# Patient Record
Sex: Female | Born: 1940
Health system: Southern US, Community
[De-identification: ages and names within clinical notes are randomized; demographics above are authoritative.]

## PROBLEM LIST (undated history)

## (undated) DIAGNOSIS — C801 Malignant (primary) neoplasm, unspecified: Secondary | ICD-10-CM

## (undated) HISTORY — PX: ABDOMINAL HYSTERECTOMY: SHX81

---

## 2016-05-20 ENCOUNTER — Emergency Department (HOSPITAL_COMMUNITY): Payer: Medicaid Other

## 2016-05-20 ENCOUNTER — Encounter (HOSPITAL_COMMUNITY): Payer: Self-pay | Admitting: Emergency Medicine

## 2016-05-20 ENCOUNTER — Inpatient Hospital Stay (HOSPITAL_COMMUNITY)
Admission: EM | Admit: 2016-05-20 | Discharge: 2016-06-13 | DRG: 375 | Disposition: A | Discharge status: Home / Self Care | Payer: Medicaid Other | Attending: Internal Medicine | Admitting: Internal Medicine

## 2016-05-20 DIAGNOSIS — Z807 Family history of other malignant neoplasms of lymphoid, hematopoietic and related tissues: Secondary | ICD-10-CM

## 2016-05-20 DIAGNOSIS — R0602 Shortness of breath: Secondary | ICD-10-CM

## 2016-05-20 DIAGNOSIS — K56600 Partial intestinal obstruction, unspecified as to cause: Secondary | ICD-10-CM

## 2016-05-20 DIAGNOSIS — C561 Malignant neoplasm of right ovary: Secondary | ICD-10-CM | POA: Diagnosis present

## 2016-05-20 DIAGNOSIS — F5104 Psychophysiologic insomnia: Secondary | ICD-10-CM | POA: Diagnosis present

## 2016-05-20 DIAGNOSIS — C786 Secondary malignant neoplasm of retroperitoneum and peritoneum: Principal | ICD-10-CM

## 2016-05-20 DIAGNOSIS — D630 Anemia in neoplastic disease: Secondary | ICD-10-CM | POA: Diagnosis present

## 2016-05-20 DIAGNOSIS — C801 Malignant (primary) neoplasm, unspecified: Secondary | ICD-10-CM

## 2016-05-20 DIAGNOSIS — E440 Moderate protein-calorie malnutrition: Secondary | ICD-10-CM | POA: Insufficient documentation

## 2016-05-20 DIAGNOSIS — C562 Malignant neoplasm of left ovary: Secondary | ICD-10-CM | POA: Diagnosis present

## 2016-05-20 DIAGNOSIS — Z5189 Encounter for other specified aftercare: Secondary | ICD-10-CM

## 2016-05-20 DIAGNOSIS — E871 Hypo-osmolality and hyponatremia: Secondary | ICD-10-CM | POA: Diagnosis present

## 2016-05-20 DIAGNOSIS — I700 Atherosclerosis of aorta: Secondary | ICD-10-CM | POA: Diagnosis present

## 2016-05-20 DIAGNOSIS — T85598D Other mechanical complication of other gastrointestinal prosthetic devices, implants and grafts, subsequent encounter: Secondary | ICD-10-CM

## 2016-05-20 DIAGNOSIS — G893 Neoplasm related pain (acute) (chronic): Secondary | ICD-10-CM | POA: Diagnosis present

## 2016-05-20 DIAGNOSIS — R140 Abdominal distension (gaseous): Secondary | ICD-10-CM

## 2016-05-20 DIAGNOSIS — K5730 Diverticulosis of large intestine without perforation or abscess without bleeding: Secondary | ICD-10-CM | POA: Diagnosis present

## 2016-05-20 DIAGNOSIS — Z23 Encounter for immunization: Secondary | ICD-10-CM

## 2016-05-20 DIAGNOSIS — R7989 Other specified abnormal findings of blood chemistry: Secondary | ICD-10-CM | POA: Diagnosis present

## 2016-05-20 DIAGNOSIS — Z0189 Encounter for other specified special examinations: Secondary | ICD-10-CM

## 2016-05-20 DIAGNOSIS — Z79899 Other long term (current) drug therapy: Secondary | ICD-10-CM

## 2016-05-20 DIAGNOSIS — I708 Atherosclerosis of other arteries: Secondary | ICD-10-CM | POA: Diagnosis present

## 2016-05-20 DIAGNOSIS — R0600 Dyspnea, unspecified: Secondary | ICD-10-CM

## 2016-05-20 DIAGNOSIS — Z4659 Encounter for fitting and adjustment of other gastrointestinal appliance and device: Secondary | ICD-10-CM

## 2016-05-20 DIAGNOSIS — K209 Esophagitis, unspecified without bleeding: Secondary | ICD-10-CM

## 2016-05-20 DIAGNOSIS — Z515 Encounter for palliative care: Secondary | ICD-10-CM

## 2016-05-20 DIAGNOSIS — K56609 Unspecified intestinal obstruction, unspecified as to partial versus complete obstruction: Secondary | ICD-10-CM

## 2016-05-20 DIAGNOSIS — D473 Essential (hemorrhagic) thrombocythemia: Secondary | ICD-10-CM

## 2016-05-20 DIAGNOSIS — B370 Candidal stomatitis: Secondary | ICD-10-CM

## 2016-05-20 DIAGNOSIS — E860 Dehydration: Secondary | ICD-10-CM | POA: Diagnosis present

## 2016-05-20 DIAGNOSIS — Z90710 Acquired absence of both cervix and uterus: Secondary | ICD-10-CM

## 2016-05-20 DIAGNOSIS — K219 Gastro-esophageal reflux disease without esophagitis: Secondary | ICD-10-CM

## 2016-05-20 DIAGNOSIS — Z9221 Personal history of antineoplastic chemotherapy: Secondary | ICD-10-CM

## 2016-05-20 DIAGNOSIS — K4020 Bilateral inguinal hernia, without obstruction or gangrene, not specified as recurrent: Secondary | ICD-10-CM | POA: Diagnosis present

## 2016-05-20 DIAGNOSIS — B3781 Candidal esophagitis: Secondary | ICD-10-CM | POA: Diagnosis present

## 2016-05-20 DIAGNOSIS — R112 Nausea with vomiting, unspecified: Secondary | ICD-10-CM

## 2016-05-20 DIAGNOSIS — C569 Malignant neoplasm of unspecified ovary: Secondary | ICD-10-CM

## 2016-05-20 DIAGNOSIS — K567 Ileus, unspecified: Secondary | ICD-10-CM

## 2016-05-20 DIAGNOSIS — J189 Pneumonia, unspecified organism: Secondary | ICD-10-CM

## 2016-05-20 DIAGNOSIS — R509 Fever, unspecified: Secondary | ICD-10-CM

## 2016-05-20 DIAGNOSIS — T85598A Other mechanical complication of other gastrointestinal prosthetic devices, implants and grafts, initial encounter: Secondary | ICD-10-CM

## 2016-05-20 DIAGNOSIS — R180 Malignant ascites: Secondary | ICD-10-CM | POA: Diagnosis present

## 2016-05-20 DIAGNOSIS — E739 Lactose intolerance, unspecified: Secondary | ICD-10-CM | POA: Diagnosis present

## 2016-05-20 HISTORY — DX: Malignant (primary) neoplasm, unspecified: C80.1

## 2016-05-20 LAB — CBC WITH DIFFERENTIAL/PLATELET
BASOS ABS: 0 10*3/uL (ref 0.0–0.1)
BASOS PCT: 1 %
EOS PCT: 1 %
Eosinophils Absolute: 0.1 10*3/uL (ref 0.0–0.7)
HEMATOCRIT: 36.6 % (ref 36.0–46.0)
Hemoglobin: 12.2 g/dL (ref 12.0–15.0)
LYMPHS PCT: 27 %
Lymphs Abs: 2.4 10*3/uL (ref 0.7–4.0)
MCH: 29.4 pg (ref 26.0–34.0)
MCHC: 33.3 g/dL (ref 30.0–36.0)
MCV: 88.2 fL (ref 78.0–100.0)
MONO ABS: 0.9 10*3/uL (ref 0.1–1.0)
Monocytes Relative: 10 %
NEUTROS ABS: 5.4 10*3/uL (ref 1.7–7.7)
Neutrophils Relative %: 61 %
PLATELETS: 464 10*3/uL — AB (ref 150–400)
RBC: 4.15 MIL/uL (ref 3.87–5.11)
RDW: 12.6 % (ref 11.5–15.5)
WBC: 8.8 10*3/uL (ref 4.0–10.5)

## 2016-05-20 LAB — COMPREHENSIVE METABOLIC PANEL
ALBUMIN: 3.7 g/dL (ref 3.5–5.0)
ALT: 18 U/L (ref 14–54)
AST: 25 U/L (ref 15–41)
Alkaline Phosphatase: 73 U/L (ref 38–126)
Anion gap: 8 (ref 5–15)
BUN: 13 mg/dL (ref 6–20)
CHLORIDE: 99 mmol/L — AB (ref 101–111)
CO2: 27 mmol/L (ref 22–32)
CREATININE: 0.82 mg/dL (ref 0.44–1.00)
Calcium: 8.8 mg/dL — ABNORMAL LOW (ref 8.9–10.3)
GFR calc Af Amer: >60 mL/min (ref >60)
GLUCOSE: 116 mg/dL — AB (ref 65–99)
POTASSIUM: 4.4 mmol/L (ref 3.5–5.1)
Sodium: 134 mmol/L — ABNORMAL LOW (ref 135–145)
Total Bilirubin: 0.6 mg/dL (ref 0.3–1.2)
Total Protein: 6.7 g/dL (ref 6.5–8.1)

## 2016-05-20 LAB — URINALYSIS, ROUTINE W REFLEX MICROSCOPIC
BACTERIA UA: NONE SEEN
BILIRUBIN URINE: NEGATIVE
Glucose, UA: NEGATIVE mg/dL
HGB URINE DIPSTICK: NEGATIVE
KETONES UR: NEGATIVE mg/dL
Nitrite: NEGATIVE
PROTEIN: NEGATIVE mg/dL
SPECIFIC GRAVITY, URINE: 1.023 (ref 1.005–1.030)
pH: 7 (ref 5.0–8.0)

## 2016-05-20 MED ORDER — ONDANSETRON HCL 4 MG/2ML IJ SOLN: 4.0000 mg | Freq: Three times a day (TID) | INTRAMUSCULAR | Status: DC | PRN

## 2016-05-20 MED ORDER — ACETAMINOPHEN 325 MG PO TABS
650.0000 mg | ORAL_TABLET | ORAL | Status: DC | PRN
  Administered 2016-05-31: 650 mg via ORAL
  Filled 2016-05-20: qty 2

## 2016-05-20 MED ORDER — PANTOPRAZOLE SODIUM 20 MG PO TBEC
20.0000 mg | DELAYED_RELEASE_TABLET | Freq: Every day | ORAL | Status: DC
  Administered 2016-05-20 – 2016-05-21 (×2): 20 mg via ORAL
  Filled 2016-05-20 (×4): qty 1

## 2016-05-20 MED ORDER — MORPHINE SULFATE (PF) 4 MG/ML IV SOLN
4.0000 mg | Freq: Once | INTRAVENOUS | Status: AC | PRN
  Administered 2016-05-20: 4 mg via INTRAVENOUS
  Filled 2016-05-20: qty 1

## 2016-05-20 MED ORDER — HYDRALAZINE HCL 20 MG/ML IJ SOLN
10.0000 mg | Freq: Four times a day (QID) | INTRAMUSCULAR | Status: DC | PRN
Start: 2016-05-20 — End: 2016-06-13

## 2016-05-20 MED ORDER — ALUM & MAG HYDROXIDE-SIMETH 200-200-20 MG/5ML PO SUSP
60.0000 mL | ORAL | Status: DC | PRN
  Administered 2016-05-20 – 2016-05-29 (×5): 60 mL via ORAL
  Filled 2016-05-20 (×5): qty 60

## 2016-05-20 MED ORDER — IOPAMIDOL (ISOVUE-300) INJECTION 61%
INTRAVENOUS | Status: AC
  Administered 2016-05-20: 100 mL via INTRAVENOUS
  Filled 2016-05-20: qty 100

## 2016-05-20 MED ORDER — ONDANSETRON HCL 4 MG/2ML IJ SOLN
4.0000 mg | Freq: Four times a day (QID) | INTRAMUSCULAR | Status: DC | PRN
  Administered 2016-05-20 – 2016-05-23 (×3): 4 mg via INTRAVENOUS
  Filled 2016-05-20 (×3): qty 2

## 2016-05-20 MED ORDER — ENOXAPARIN SODIUM 40 MG/0.4ML ~~LOC~~ SOLN
40.0000 mg | SUBCUTANEOUS | Status: DC
  Administered 2016-05-20 – 2016-06-06 (×18): 40 mg via SUBCUTANEOUS
  Filled 2016-05-20 (×19): qty 0.4

## 2016-05-20 MED ORDER — SODIUM CHLORIDE 0.9 % IJ SOLN
INTRAMUSCULAR | Status: AC
  Filled 2016-05-20: qty 50

## 2016-05-20 MED ORDER — PROMETHAZINE HCL 25 MG/ML IJ SOLN
6.2500 mg | INTRAMUSCULAR | Status: DC | PRN
Start: 2016-05-20 — End: 2016-06-13
  Administered 2016-05-20: 6.25 mg via INTRAVENOUS
  Filled 2016-05-20 (×2): qty 1

## 2016-05-20 MED ORDER — OXYCODONE-ACETAMINOPHEN 5-325 MG PO TABS
1.0000 | ORAL_TABLET | ORAL | Status: DC | PRN
  Administered 2016-05-21 – 2016-06-03 (×6): 1 via ORAL
  Filled 2016-05-20 (×6): qty 1

## 2016-05-20 MED ORDER — MORPHINE SULFATE (PF) 2 MG/ML IV SOLN
2.0000 mg | INTRAVENOUS | Status: DC | PRN
Start: 2016-05-20 — End: 2016-06-03
  Administered 2016-05-22 – 2016-06-02 (×2): 2 mg via INTRAVENOUS
  Filled 2016-05-20 (×2): qty 1

## 2016-05-20 MED ORDER — ENSURE ENLIVE PO LIQD
237.0000 mL | Freq: Two times a day (BID) | ORAL | Status: DC
  Administered 2016-05-21 – 2016-05-28 (×5): 237 mL via ORAL

## 2016-05-20 MED ORDER — ZOLPIDEM TARTRATE 5 MG PO TABS
5.0000 mg | ORAL_TABLET | Freq: Once | ORAL | Status: AC
Start: 2016-05-20 — End: 2016-05-29
  Administered 2016-05-29: 5 mg via ORAL
  Filled 2016-05-20 (×2): qty 1

## 2016-05-20 MED ORDER — SENNOSIDES-DOCUSATE SODIUM 8.6-50 MG PO TABS
1.0000 | ORAL_TABLET | Freq: Two times a day (BID) | ORAL | Status: DC
  Administered 2016-05-20 – 2016-05-29 (×10): 1 via ORAL
  Filled 2016-05-20 (×12): qty 1

## 2016-05-20 MED ORDER — SIMETHICONE 80 MG PO CHEW: 160.0000 mg | CHEWABLE_TABLET | Freq: Four times a day (QID) | ORAL | Status: DC | PRN

## 2016-05-20 NOTE — ED Notes (Signed)
Patient transported to CT 

## 2016-05-20 NOTE — ED Triage Notes (Signed)
Assessment with use of interp pad in Spanish, c/o bloated, pain, gas, symptoms started 3 weeks ago cough, pain started with cough ended, Lots of gas, no difficulty with bowels, small bms due to decreased appetite, no vomiting, no fever, OTC meds Tylenol and Gas X. Pain in abd more across bottom, scar on abd from surgery due to tumor in ovaries that spread, Last Chemo Sept 17.

## 2016-05-20 NOTE — H&P (Signed)
History and Physical  Audrey Garza [id number] DOB: 1941/02/04 DOA: 05/20/2016  Referring physician: EDP PCP: No primary care provider on file.   Chief Complaint: abdominal pain  HPI: Audrey Garza is a 75 y.o. 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 She started to have progressive abdominal bloating and pain in the last three weeks, she denies fever, no n/v, no diarrhea. No chest pain or sob.  ED course: her vital is stable, basic labs with mild thrombocytosis plt 464, mild hyponatremia, sodium 134, ua with large leukocytes but no bacteria. CT ab/pel: Extensive peritoneal disease with omental thickening and large volume ascites. 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 Trace bilateral pleural effusions. Moderate hiatal hernia. Bilateral inguinal hernias containing fat.Left colonic diverticulosis.  Aortoiliac atherosclerosis.  hospitalist called to observe this patient in the hospital for pain control and diagnostic and therapeutic paracentesis.     Review of Systems:  Detail per HPI, Review of systems are otherwise negative  Past Medical History:  Diagnosis Date  . Cancer [facility])    Past Surgical History:  Procedure Laterality Date  . ABDOMINAL HYSTERECTOMY     Social History:  reports that she has never smoked. She has never used smokeless tobacco. She reports that she does not drink alcohol. Her drug history is not on file. Patient lives at home & is able to participate in activities of daily living independently   No Known Allergies  No family history on file.    Prior to Admission medications   Medication Sig Start Date End Date Taking? Authorizing Provider  acetaminophen (TYLENOL) 325 MG tablet Take 650  mg by mouth every 5 (five) hours as needed for moderate pain.   Yes Historical Provider, MD  magnesium 30 MG tablet Take 30 mg by mouth 2 (two) times daily.   Yes Historical Provider, MD  Potassium 75 MG TABS Take 75 mg by mouth daily.   Yes Historical Provider, MD  simethicone (GAS-X) 80 MG chewable tablet Chew 160 mg by mouth every 6 (six) hours as needed for flatulence.   Yes Historical Provider, MD    Physical Exam: BP 184/96   Pulse 86   Temp 98.1 F (36.7 C) (Oral)   Resp 16   Ht 5\' 3"  (1.6 m)   Wt 78 kg (171 lb 15.3 oz)   SpO2 95%   BMI 30.46 kg/m   General:  NAD Eyes: PERRL ENT: unremarkable Neck: supple, no JVD Cardiovascular: RRR Respiratory: CTABL Abdomen: distended, soft, nontender, positive bowel sounds,  Skin: no rash Musculoskeletal:  No edema Psychiatric: calm/cooperative Neurologic: no focal findings            Labs on Admission:  Basic Metabolic Panel:  Recent Labs Lab 05/20/16 1336  NA 134*  K 4.4  CL 99*  CO2 27  GLUCOSE 116*  BUN 13  CREATININE 0.82  CALCIUM 8.8*   Liver Function Tests:  Recent Labs Lab 05/20/16 1336  AST 25  ALT 18  ALKPHOS 73  BILITOT 0.6  PROT 6.7  ALBUMIN 3.7   No results for input(s): LIPASE, AMYLASE in the last 168 hours. No results for input(s): AMMONIA in the last 168 hours. CBC:  Recent Labs Lab 05/20/16 1336  WBC 8.8  NEUTROABS 5.4  HGB 12.2  HCT 36.6  MCV 88.2  PLT 464*   Cardiac Enzymes: No results for input(s): CKTOTAL, CKMB, CKMBINDEX, TROPONINI in the last 168 hours.  BNP (last 3 results) No results for input(s): BNP in the last 8760 hours.  ProBNP (last 3 results) No results for input(s): PROBNP in the last 8760 hours.  CBG: No results for input(s): GLUCAP in the last 168 hours.  Radiological Exams on Admission: Ct Abdomen Pelvis W Contrast  Result Date: 05/20/2016 CLINICAL DATA:  Abdominal distention, pain. EXAM: CT ABDOMEN AND PELVIS WITH CONTRAST TECHNIQUE: Multidetector  CT imaging of the abdomen and pelvis was performed using the standard protocol following bolus administration of intravenous contrast. CONTRAST:  146mL ISOVUE-300 IOPAMIDOL (ISOVUE-300) INJECTION 61% COMPARISON:  None. FINDINGS: Lower chest: Bibasilar atelectasis. Heart is normal size. Moderate-sized hiatal hernia. Trace bilateral effusions. Hepatobiliary: No focal hepatic abnormality. Gallbladder unremarkable. Pancreas: No focal abnormality or ductal dilatation. Spleen: No focal abnormality.  Normal size. Adrenals/Urinary Tract: Small cyst in the midpole of the left kidney. No hydronephrosis. Adrenal glands and urinary bladder unremarkable. Stomach/Bowel: Descending colonic and sigmoid diverticulosis. No active diverticulitis. Stomach and small bowel unremarkable. Vascular/Lymphatic: Aortic and iliac calcifications. No aneurysm. No adenopathy. Reproductive: Prior hysterectomy.  No adnexal masses. Other: There is large volume ascites in the abdomen or pelvis. Extensive abnormal soft tissue throughout the omentum R with peritoneal malignancy. No free air. Bilateral inguinal hernias containing fat. Musculoskeletal: No acute bony abnormality or focal bone lesion. IMPRESSION: Extensive peritoneal disease with omental thickening/ 18 and large volume ascites. 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 Trace bilateral pleural effusions. Moderate hiatal hernia. Bilateral inguinal hernias containing fat. Left colonic diverticulosis. Aortoiliac atherosclerosis. Electronically Signed   By: Rolm Baptise M.D.   On: 05/20/2016 15:10      Assessment/Plan Present on Admission: **None**  Symptomatic large Malignant ascites : With significant pain, bloating, although patient denies n/v initially, she vomited after she is transferred from the ED to the floor. Analgesics and antiemtics prn, full liquid diet for now Npo aftermidnight for diagnostic and therapeutic paracentesis. Fluids study  ordered.   Metastatic ovarian cancer with extensive peritoneal disease with omental thickening with large ascites: Will check ca 125,  Family will bring in prior medical record on sunday She still seems to have good performance status, will consult gyn onc Dr Marko Plume on Monday  Cancer pain:  Prn percocet and iv morphine with stool softener.   Mild hyponatremia: likely from dehydration, will avoid ivf for now due to large ascites,   Mild thrombocytosis: likely reactive.   DVT prophylaxis: lovenox  Consultants: will call Dr Marko Plume on monday  Code Status: full   Family Communication:  Patient , her daughter at bedside, her son in law over the phone, daughter states they will bring in patient's medical record tomorrow  Disposition Plan: admit to med surg obs  Time spent: 70mins  Audrey Betzold MD, PhD [facility] Pager [phone] If 7PM-7AM, please contact night-coverage at [email or web address], password [facility]

## 2016-05-20 NOTE — ED Provider Notes (Signed)
[facility] DEPT Provider Note   CSN: [id number] Arrival date & time: 05/20/16  1301     History   Chief Complaint No chief complaint on file.   HPI Audrey Garza is a 75 y.o. female.  Patient with h/o reported metastatic ovarian CA with spread to the peritoneum and lymph glands, status post surgery in [place] in 04/2015, status post chemotherapy ending 02/2016, recently moved to [place] approximately 6 weeks ago -- presents with complaint of worsening abdominal pain, decreased urination, decreased bowel movements, and unable to pass gas for the past 3 weeks. Abdomen is becoming more distended and painful. Patient has a very poor appetite. No CP or SOB. The onset of this condition was acute. The course is worsening. Aggravating factors: none. Alleviating factors: none.        Past Medical History:  Diagnosis Date  . Cancer ([facility])     There are no active problems to display for this patient.   Past Surgical History:  Procedure Laterality Date  . ABDOMINAL HYSTERECTOMY      OB History    No data available       Home Medications    Prior to Admission medications   Not on File    Family History No family history on file.  Social History Social History  Substance Use Topics  . Smoking status: Never Smoker  . Smokeless tobacco: Never Used  . Alcohol use No     Allergies   Patient has no allergy information on record.   Review of Systems Review of Systems  Constitutional: Negative for fever.  HENT: Negative for rhinorrhea and sore throat.   Eyes: Negative for redness.  Respiratory: Negative for cough.   Cardiovascular: Negative for chest pain.  Gastrointestinal: Positive for abdominal distention, abdominal pain, constipation and nausea. Negative for diarrhea and vomiting.  Genitourinary: Negative for dysuria.  Musculoskeletal: Negative for myalgias.  Skin: Negative for rash.  Neurological: Negative for headaches.     Physical  Exam Updated Vital Signs BP 168/99 (BP Location: Left Arm)   Pulse 92   Temp 98.1 F (36.7 C) (Oral)   Resp 18   Ht 5\' 3"  (1.6 m)   Wt 78 kg   SpO2 96%   BMI 30.46 kg/m   Physical Exam  Constitutional: She appears well-developed and well-nourished.  HENT:  Head: Normocephalic and atraumatic.  Eyes: Conjunctivae are normal. Right eye exhibits no discharge. Left eye exhibits no discharge.  Neck: Normal range of motion. Neck supple.  Cardiovascular: Normal rate, regular rhythm and normal heart sounds.   Pulmonary/Chest: Effort normal and breath sounds normal.  Abdominal: Soft. She exhibits distension and mass (RLQ and LLQ). There is tenderness (mild, generalized). There is no rebound and no guarding.  Neurological: She is alert.  Skin: Skin is warm and dry.  Psychiatric: She has a normal mood and affect.  Nursing note and vitals reviewed.    ED Treatments / Results  Labs (all labs ordered are listed, but only abnormal results are displayed) Labs Reviewed  CBC WITH DIFFERENTIAL/PLATELET - Abnormal; Notable for the following:       Result Value   Platelets 464 (*)    All other components within normal limits  COMPREHENSIVE METABOLIC PANEL - Abnormal; Notable for the following:    Sodium 134 (*)    Chloride 99 (*)    Glucose, Bld 116 (*)    Calcium 8.8 (*)    All other components within normal limits  URINALYSIS, ROUTINE  W REFLEX MICROSCOPIC - Abnormal; Notable for the following:    APPearance HAZY (*)    Leukocytes, UA LARGE (*)    Squamous Epithelial / LPF 0-5 (*)    Non Squamous Epithelial 0-5 (*)    All other components within normal limits  BASIC METABOLIC PANEL  PROTIME-INR  CA 125  TSH    Radiology Ct Abdomen Pelvis W Contrast  Result Date: 05/20/2016 CLINICAL DATA:  Abdominal distention, pain. EXAM: CT ABDOMEN AND PELVIS WITH CONTRAST TECHNIQUE: Multidetector CT imaging of the abdomen and pelvis was performed using the standard protocol following bolus  administration of intravenous contrast. CONTRAST:  165mL ISOVUE-300 IOPAMIDOL (ISOVUE-300) INJECTION 61% COMPARISON:  None. FINDINGS: Lower chest: Bibasilar atelectasis. Heart is normal size. Moderate-sized hiatal hernia. Trace bilateral effusions. Hepatobiliary: No focal hepatic abnormality. Gallbladder unremarkable. Pancreas: No focal abnormality or ductal dilatation. Spleen: No focal abnormality.  Normal size. Adrenals/Urinary Tract: Small cyst in the midpole of the left kidney. No hydronephrosis. Adrenal glands and urinary bladder unremarkable. Stomach/Bowel: Descending colonic and sigmoid diverticulosis. No active diverticulitis. Stomach and small bowel unremarkable. Vascular/Lymphatic: Aortic and iliac calcifications. No aneurysm. No adenopathy. Reproductive: Prior hysterectomy.  No adnexal masses. Other: There is large volume ascites in the abdomen or pelvis. Extensive abnormal soft tissue throughout the omentum R with peritoneal malignancy. No free air. Bilateral inguinal hernias containing fat. Musculoskeletal: No acute bony abnormality or focal bone lesion. IMPRESSION: Extensive peritoneal disease with omental thickening/ 18 and large volume ascites. 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 Trace bilateral pleural effusions. Moderate hiatal hernia. Bilateral inguinal hernias containing fat. Left colonic diverticulosis. Aortoiliac atherosclerosis. Electronically Signed   By: Rolm Baptise M.D.   On: 05/20/2016 15:10    Procedures Procedures (including critical care time)  Medications Ordered in ED Medications  sodium chloride 0.9 % injection (  Not Given 05/20/16 1445)  ondansetron (ZOFRAN) injection 4 mg (4 mg Intravenous Given 05/20/16 1622)  acetaminophen (TYLENOL) tablet 650 mg (not administered)  simethicone (MYLICON) chewable tablet 160 mg (not administered)  enoxaparin (LOVENOX) injection 40 mg (not administered)  ondansetron (ZOFRAN) injection 4 mg (not  administered)  morphine 2 MG/ML injection 2 mg (not administered)  oxyCODONE-acetaminophen (PERCOCET/ROXICET) 5-325 MG per tablet 1 tablet (not administered)  senna-docusate (Senokot-S) tablet 1 tablet (not administered)  pantoprazole (PROTONIX) EC tablet 20 mg (not administered)  feeding supplement (ENSURE ENLIVE) (ENSURE ENLIVE) liquid 237 mL (not administered)  alum & mag hydroxide-simeth (MAALOX/MYLANTA) 200-200-20 MG/5ML suspension 60 mL (not administered)  iopamidol (ISOVUE-300) 61 % injection (100 mLs Intravenous Contrast Given 05/20/16 1445)  morphine 4 MG/ML injection 4 mg (4 mg Intravenous Given 05/20/16 1622)     Initial Impression / Assessment and Plan / ED Course  I have reviewed the triage vital signs and the nursing notes.  Pertinent labs & imaging results that were available during my care of the patient were reviewed by me and considered in my medical decision making (see chart for details).  Clinical Course    Patient seen and examined. Work-up initiated. History taken from patient's daughters (who speak English) and from patient (using visual interpreter).   Vital signs reviewed and are as follows: BP 168/99 (BP Location: Left Arm)   Pulse 92   Temp 98.1 F (36.7 C) (Oral)   Resp 18   Ht 5\' 3"  (1.6 m)   Wt 78 kg   SpO2 96%   BMI 30.46 kg/m   Patient discussed with and seen by Dr. Tamera Punt.  Patient and family updated on results. Will admit for symptom management and possible therapeutic paracentesis. She would also benefit from SW assistance to arrange care as she is new to the [place] and is not established here with a very serious medical condition.   Spoke with Dr. Erlinda Hong of [facility] who will see. Admit to obs.   Final Clinical Impressions(s) / ED Diagnoses   Final diagnoses:  Therapeutic  Malignant ascites   Admit.   New Prescriptions Current Discharge Medication List       Carlisle Cater, PA-C 05/20/16 [place], MD 05/21/16 [phone]

## 2016-05-20 NOTE — ED Triage Notes (Signed)
Attempted to call report to [address], nurse not available, waiting for call back for report.

## 2016-05-20 NOTE — ED Triage Notes (Signed)
Pt presents with daughter, does not speak english. C/o abdominal distention and pain, acid reflux and urinary retention. Pt has hx of peritoneal cancer and was cleared this past September. Pts abdomen appears distended and soft to touch. Daughter adds that pt has not been able to eat, has one bite and c/o feeling full and food is coming back up.

## 2016-05-20 NOTE — ED Triage Notes (Signed)
Emotional support provided to family and updates regarding POC

## 2016-05-21 DIAGNOSIS — K5730 Diverticulosis of large intestine without perforation or abscess without bleeding: Secondary | ICD-10-CM | POA: Diagnosis present

## 2016-05-21 DIAGNOSIS — K56600 Partial intestinal obstruction, unspecified as to cause: Secondary | ICD-10-CM | POA: Diagnosis present

## 2016-05-21 DIAGNOSIS — Z90710 Acquired absence of both cervix and uterus: Secondary | ICD-10-CM | POA: Diagnosis not present

## 2016-05-21 DIAGNOSIS — C562 Malignant neoplasm of left ovary: Secondary | ICD-10-CM | POA: Diagnosis present

## 2016-05-21 DIAGNOSIS — K567 Ileus, unspecified: Secondary | ICD-10-CM | POA: Diagnosis present

## 2016-05-21 DIAGNOSIS — E871 Hypo-osmolality and hyponatremia: Secondary | ICD-10-CM | POA: Diagnosis present

## 2016-05-21 DIAGNOSIS — I708 Atherosclerosis of other arteries: Secondary | ICD-10-CM | POA: Diagnosis present

## 2016-05-21 DIAGNOSIS — R180 Malignant ascites: Secondary | ICD-10-CM | POA: Diagnosis present

## 2016-05-21 DIAGNOSIS — G893 Neoplasm related pain (acute) (chronic): Secondary | ICD-10-CM | POA: Diagnosis present

## 2016-05-21 DIAGNOSIS — C561 Malignant neoplasm of right ovary: Secondary | ICD-10-CM | POA: Diagnosis present

## 2016-05-21 DIAGNOSIS — C786 Secondary malignant neoplasm of retroperitoneum and peritoneum: Secondary | ICD-10-CM | POA: Diagnosis present

## 2016-05-21 DIAGNOSIS — I700 Atherosclerosis of aorta: Secondary | ICD-10-CM | POA: Diagnosis present

## 2016-05-21 DIAGNOSIS — E860 Dehydration: Secondary | ICD-10-CM | POA: Diagnosis present

## 2016-05-21 DIAGNOSIS — B3781 Candidal esophagitis: Secondary | ICD-10-CM | POA: Diagnosis present

## 2016-05-21 DIAGNOSIS — K4020 Bilateral inguinal hernia, without obstruction or gangrene, not specified as recurrent: Secondary | ICD-10-CM | POA: Diagnosis present

## 2016-05-21 DIAGNOSIS — B370 Candidal stomatitis: Secondary | ICD-10-CM | POA: Diagnosis present

## 2016-05-21 DIAGNOSIS — E440 Moderate protein-calorie malnutrition: Secondary | ICD-10-CM | POA: Diagnosis present

## 2016-05-21 DIAGNOSIS — Z79899 Other long term (current) drug therapy: Secondary | ICD-10-CM | POA: Diagnosis not present

## 2016-05-21 LAB — BASIC METABOLIC PANEL
ANION GAP: 5 (ref 5–15)
BUN: 11 mg/dL (ref 6–20)
CALCIUM: 8.8 mg/dL — AB (ref 8.9–10.3)
CHLORIDE: 101 mmol/L (ref 101–111)
CO2: 29 mmol/L (ref 22–32)
Creatinine, Ser: 0.87 mg/dL (ref 0.44–1.00)
GFR calc non Af Amer: >60 mL/min (ref >60)
Glucose, Bld: 113 mg/dL — ABNORMAL HIGH (ref 65–99)
Potassium: 4.6 mmol/L (ref 3.5–5.1)
SODIUM: 135 mmol/L (ref 135–145)

## 2016-05-21 LAB — PROTIME-INR
INR: 0.96
PROTHROMBIN TIME: 12.7 s (ref 11.4–15.2)

## 2016-05-21 LAB — TSH: TSH: 4.489 u[IU]/mL (ref 0.350–4.500)

## 2016-05-21 MED ORDER — ZOLPIDEM TARTRATE 5 MG PO TABS
5.0000 mg | ORAL_TABLET | Freq: Once | ORAL | Status: AC
  Administered 2016-05-21: 5 mg via ORAL
  Filled 2016-05-21: qty 1

## 2016-05-21 NOTE — Progress Notes (Signed)
PROGRESS NOTE  Audrey Garza [id number] DOB: 1941/04/09 DOA: 05/20/2016 PCP: No primary care provider on file.  HPI/Recap of past 24 hours:  Vomited once yesterday evening, no vomiting since Abdominal bloated, requiring prn morphine,  Awaiting diagnostic and therapeutic paracentesis  Patient does not speak english, Family at bedside translating  Assessment/Plan: Active Problems:   Malignant ascites  Symptomatic large Malignant ascites : With significant pain, bloating, although patient denies n/v initially, she vomited after she is transferred from the ED to the floor. Analgesics and antiemtics prn, full liquid diet for now Npo aftermidnight for diagnostic and therapeutic paracentesis. Fluids study ordered.   Metastatic ovarian cancer with extensive peritoneal disease with omental thickening with large ascites: ca 125 pending Family brought in prior medical record, but in [place]. She still seems to have good performance status, will consult gyn onc Dr Marko Plume on Monday  Cancer pain:  Prn percocet and iv morphine with stool softener.   Mild hyponatremia: likely from dehydration, will avoid ivf for now due to large ascites,   Mild thrombocytosis: likely reactive.   DVT prophylaxis: lovenox  Code Status: full  Family Communication: patient , her daughter and son in law at bedside  Disposition Plan: home in 1-2 days   Consultants:  Will call Dr Marko Plume on monday  Procedures:  paracentesis  Antibiotics:  none   Objective: BP 138/79 (BP Location: Right Arm)   Pulse 76   Temp 98.4 F (36.9 C) (Oral)   Resp 14   Ht 5\' 3"  (1.6 m)   Wt 80.2 kg (176 lb 12.9 oz)   SpO2 95%   BMI 31.32 kg/m   Intake/Output Summary (Last 24 hours) at 05/21/16 1559 Last data filed at 05/21/16 1346  Gross per 24 hour  Intake              600 ml  Output                0 ml  Net              600 ml   Filed Weights   05/20/16 1332 05/20/16 1818  Weight: 78 kg  (171 lb 15.3 oz) 80.2 kg (176 lb 12.9 oz)    Exam:   General:  Intermittent abdominal pain, poor appetite  Cardiovascular: RRR  Respiratory: CTABL  Abdomen: distended, soft, nontender, positive bowel sounds  Musculoskeletal: No Edema  Neuro: aaox3  Data Reviewed: Basic Metabolic Panel:  Recent Labs Lab 05/20/16 1336 05/21/16 0351  NA 134* 135  K 4.4 4.6  CL 99* 101  CO2 27 29  GLUCOSE 116* 113*  BUN 13 11  CREATININE 0.82 0.87  CALCIUM 8.8* 8.8*   Liver Function Tests:  Recent Labs Lab 05/20/16 1336  AST 25  ALT 18  ALKPHOS 73  BILITOT 0.6  PROT 6.7  ALBUMIN 3.7   No results for input(s): LIPASE, AMYLASE in the last 168 hours. No results for input(s): AMMONIA in the last 168 hours. CBC:  Recent Labs Lab 05/20/16 1336  WBC 8.8  NEUTROABS 5.4  HGB 12.2  HCT 36.6  MCV 88.2  PLT 464*   Cardiac Enzymes:   No results for input(s): CKTOTAL, CKMB, CKMBINDEX, TROPONINI in the last 168 hours. BNP (last 3 results) No results for input(s): BNP in the last 8760 hours.  ProBNP (last 3 results) No results for input(s): PROBNP in the last 8760 hours.  CBG: No results for input(s): GLUCAP in the last 168 hours.  No results  found for this or any previous visit (from the past 240 hour(s)).   Studies: No results found.  Scheduled Meds: . enoxaparin (LOVENOX) injection  40 mg Subcutaneous Q24H  . feeding supplement (ENSURE ENLIVE)  237 mL Oral BID BM  . pantoprazole  20 mg Oral Daily  . senna-docusate  1 tablet Oral BID  . zolpidem  5 mg Oral Once    Continuous Infusions:   Time spent: 53mins  Deneka Greenwalt MD, PhD  [facility] Pager [phone]. If 7PM-7AM, please contact night-coverage at [email or web address], password [facility] 05/21/2016, 3:59 PM  LOS: 0 days

## 2016-05-22 ENCOUNTER — Inpatient Hospital Stay (HOSPITAL_COMMUNITY): Payer: Medicaid Other

## 2016-05-22 LAB — PROTEIN, BODY FLUID: Total protein, fluid: 4.2 g/dL

## 2016-05-22 LAB — ALBUMIN, FLUID (OTHER): Albumin, Fluid: 2.7 g/dL

## 2016-05-22 LAB — LACTATE DEHYDROGENASE, PLEURAL OR PERITONEAL FLUID: LD FL: 1000 U/L — AB (ref 3–23)

## 2016-05-22 LAB — BODY FLUID CELL COUNT WITH DIFFERENTIAL
Lymphs, Fluid: 48 %
Monocyte-Macrophage-Serous Fluid: 43 % — ABNORMAL LOW (ref 50–90)
Neutrophil Count, Fluid: 9 % (ref 0–25)
WBC FLUID: 1514 uL — AB (ref 0–1000)

## 2016-05-22 LAB — GRAM STAIN

## 2016-05-22 LAB — GLUCOSE, SEROUS FLUID: GLUCOSE FL: 86 mg/dL

## 2016-05-22 LAB — CA 125: CA 125: 249.8 U/mL — ABNORMAL HIGH (ref 0.0–38.1)

## 2016-05-22 NOTE — Progress Notes (Signed)
Medical Oncology  EMR reviewed and case discussed with primary service.  Patient seen together with daughter. Patient speaks only Spanish, however daughter is able to give history of ovarian carcinoma treated with surgery and chemotherapy (taxol carboplatin avastin) in [place] in past year, chemotherapy completed early 02-2016. Summary records in [place]  and scans from [place] are with patient.   Patient is now living with daughter in [place]. She presented to ED on 05-20-16 with 1-2 weeks of abdominal fullness and soreness similar to symptoms at initial diagnosis.   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 She had US paracentesis for 2.9 liters ascites earlier today, cytology pending.  Patient has had vomiting x 2 earlier today, still generalized abdominal soreness tho overall more comfortable since paracentesis. Recent respiratory illness has resolved.   Full note to follow, with translation of outside medical records pending. 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 Agree with symptom management now, hopefully will be more comfortable with paracentesis done today. If stable for DC, expect to begin second line chemotherapy soon as outpatient. Will follow with you.    Evlyn Clines MD    [phone] pager

## 2016-05-22 NOTE — Procedures (Signed)
Successful US guided diagnostic and therapeutic paracentesis from right lower abdomen.  Yielded 2.9 L of clear, yellow fluid.  No immediate complications.  Pt tolerated well.   Specimen was sent for labs.  Docia Barrier PA-C 05/22/2016 12:45 PM

## 2016-05-22 NOTE — Progress Notes (Addendum)
PROGRESS NOTE  Audrey Garza [id number] DOB: 02/08/1941 DOA: 05/20/2016 PCP: No primary care provider on file.  HPI/Recap of past 24 hours:  Awaiting for paracentesis Abdominal bloated, requiring prn morphine,  Awaiting diagnostic and therapeutic paracentesis  Patient does not speak english, Family at bedside translating  Assessment/Plan: Active Problems:   Malignant ascites  Symptomatic large Malignant ascites : With significant pain, bloating, although patient denies n/v initially, she vomited after she is transferred from the ED to the floor. Analgesics and antiemtics prn, full liquid diet for now Npo aftermidnight for diagnostic and therapeutic paracentesis. Fluids study ordered.   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 She still seems to have good performance status, gyn onc Dr Marko Plume consulted,  Patient is to have paracentesis today, she will be seen by Dr Marko Plume today, patient may be able to be discharged after eval by Dr Marko Plume.  Cancer pain:  Prn percocet and iv morphine with stool softener.   Mild hyponatremia: likely from dehydration, will avoid ivf for now due to large ascites, encourage oral intake, sodium normalized.  Mild thrombocytosis: likely reactive.  recurrent n/v, ct abdomen did showed hiatal hernia, family request gi consult  DVT prophylaxis: lovenox  Code Status: full  Family Communication: patient , her daughter and son in law at bedside  Disposition Plan: home if cleared by Dr Marko Plume and gi   Consultants:  Gyn onc Dr Marko Plume   Procedures:  Diagnostic and therapeutic Paracentesis on 12/18  Antibiotics:  none   Objective: BP 140/68 (BP Location: Right Arm)   Pulse 75   Temp 97.6 F (36.4 C) (Oral)   Resp 14   Ht 5\' 3"  (1.6 m)   Wt 80.2 kg (176 lb 12.9 oz)   SpO2 99%   BMI  31.32 kg/m   Intake/Output Summary (Last 24 hours) at 05/22/16 0805 Last data filed at 05/22/16 0525  Gross per 24 hour  Intake              720 ml  Output                0 ml  Net              720 ml   Filed Weights   05/20/16 1332 05/20/16 1818  Weight: 78 kg (171 lb 15.3 oz) 80.2 kg (176 lb 12.9 oz)    Exam:   General:  Intermittent abdominal pain, poor appetite  Cardiovascular: RRR  Respiratory: CTABL  Abdomen: distended, soft, nontender, positive bowel sounds  Musculoskeletal: No Edema  Neuro: aaox3  Data Reviewed: Basic Metabolic Panel:  Recent Labs Lab 05/20/16 1336 05/21/16 0351  NA 134* 135  K 4.4 4.6  CL 99* 101  CO2 27 29  GLUCOSE 116* 113*  BUN 13 11  CREATININE 0.82 0.87  CALCIUM 8.8* 8.8*   Liver Function Tests:  Recent Labs Lab 05/20/16 1336  AST 25  ALT 18  ALKPHOS 73  BILITOT 0.6  PROT 6.7  ALBUMIN 3.7   No results for input(s): LIPASE, AMYLASE in the last 168 hours. No results for input(s): AMMONIA in the last 168 hours. CBC:  Recent Labs Lab 05/20/16 1336  WBC 8.8  NEUTROABS 5.4  HGB 12.2  HCT 36.6  MCV 88.2  PLT 464*   Cardiac Enzymes:   No results for input(s): CKTOTAL, CKMB, CKMBINDEX, TROPONINI in the last 168 hours.  BNP (last 3 results) No results for input(s): BNP in the last 8760 hours.  ProBNP (last 3 results) No results for input(s): PROBNP in the last 8760 hours.  CBG: No results for input(s): GLUCAP in the last 168 hours.  No results found for this or any previous visit (from the past 240 hour(s)).   Studies: No results found.  Scheduled Meds: . enoxaparin (LOVENOX) injection  40 mg Subcutaneous Q24H  . feeding supplement (ENSURE ENLIVE)  237 mL Oral BID BM  . pantoprazole  20 mg Oral Daily  . senna-docusate  1 tablet Oral BID  . zolpidem  5 mg Oral Once    Continuous Infusions:   Time spent: 58mins  Oscar Hank MD, PhD  [facility] Pager [phone]. If 7PM-7AM, please contact  night-coverage at [email or web address], password [facility] 05/22/2016, 8:05 AM  LOS: 1 day

## 2016-05-23 ENCOUNTER — Inpatient Hospital Stay (HOSPITAL_COMMUNITY): Payer: Medicaid Other

## 2016-05-23 DIAGNOSIS — K567 Ileus, unspecified: Secondary | ICD-10-CM

## 2016-05-23 DIAGNOSIS — C786 Secondary malignant neoplasm of retroperitoneum and peritoneum: Principal | ICD-10-CM

## 2016-05-23 DIAGNOSIS — C569 Malignant neoplasm of unspecified ovary: Secondary | ICD-10-CM

## 2016-05-23 DIAGNOSIS — I700 Atherosclerosis of aorta: Secondary | ICD-10-CM

## 2016-05-23 DIAGNOSIS — K5730 Diverticulosis of large intestine without perforation or abscess without bleeding: Secondary | ICD-10-CM

## 2016-05-23 DIAGNOSIS — J90 Pleural effusion, not elsewhere classified: Secondary | ICD-10-CM

## 2016-05-23 DIAGNOSIS — E440 Moderate protein-calorie malnutrition: Secondary | ICD-10-CM | POA: Insufficient documentation

## 2016-05-23 DIAGNOSIS — K4090 Unilateral inguinal hernia, without obstruction or gangrene, not specified as recurrent: Secondary | ICD-10-CM

## 2016-05-23 DIAGNOSIS — K56699 Other intestinal obstruction unspecified as to partial versus complete obstruction: Secondary | ICD-10-CM

## 2016-05-23 DIAGNOSIS — K56609 Unspecified intestinal obstruction, unspecified as to partial versus complete obstruction: Secondary | ICD-10-CM

## 2016-05-23 DIAGNOSIS — K5909 Other constipation: Secondary | ICD-10-CM

## 2016-05-23 DIAGNOSIS — R180 Malignant ascites: Secondary | ICD-10-CM

## 2016-05-23 MED ORDER — BISACODYL 10 MG RE SUPP
10.0000 mg | Freq: Every day | RECTAL | Status: AC
  Administered 2016-05-23 – 2016-05-25 (×3): 10 mg via RECTAL
  Filled 2016-05-23 (×3): qty 1

## 2016-05-23 MED ORDER — SODIUM CHLORIDE 0.9 % IV SOLN
INTRAVENOUS | Status: AC
  Administered 2016-05-23 – 2016-05-24 (×3): via INTRAVENOUS

## 2016-05-23 MED ORDER — PANTOPRAZOLE SODIUM 40 MG IV SOLR
40.0000 mg | INTRAVENOUS | Status: DC
  Administered 2016-05-23 – 2016-06-13 (×22): 40 mg via INTRAVENOUS
  Filled 2016-05-23 (×22): qty 40

## 2016-05-23 MED ORDER — ONDANSETRON HCL 40 MG/20ML IJ SOLN
8.0000 mg | Freq: Three times a day (TID) | INTRAMUSCULAR | Status: DC
  Filled 2016-05-23 (×2): qty 4

## 2016-05-23 MED ORDER — LORAZEPAM 2 MG/ML IJ SOLN
0.5000 mg | Freq: Four times a day (QID) | INTRAMUSCULAR | Status: DC | PRN
  Administered 2016-06-06: 1 mg via INTRAVENOUS
  Filled 2016-05-23 (×2): qty 1

## 2016-05-23 MED ORDER — SODIUM CHLORIDE 0.9 % IV SOLN
8.0000 mg | Freq: Three times a day (TID) | INTRAVENOUS | Status: DC | PRN
  Filled 2016-05-23: qty 4

## 2016-05-23 NOTE — Consult Note (Signed)
Reason for Consult: SBO Referring Physician: Dr. Wanda Plump. Audrey Garza  Audrey Garza is an 75 y.o. female.  HPI: Pt presented to the ED on 05/20/16 with abdominal distention and pain, acid reflux and urinary retention. She is not able to eat or drink, secondary to vomiting. Pt does not speak [place] and recently came from [place]. 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   Work up in the ED showed she was afebrile, and VSS.  Na down, but otherwise stable.  CA 125 is 249.  CT scan on admit : Descending colonic and sigmoid diverticulosis. No active diverticulitis. Stomach and small bowel unremarkable. There is large volume ascites in the abdomen or pelvis. Extensive abnormal soft tissue throughout the omentum R withperitoneal malignancy. No free air. Bilateral inguinal hernias containing fat. Moderate hiatal hernia. Bilateral inguinal hernias containing fat.  Extensive peritoneal disease with omental thickening and large volume ascites. No evidence of obstruction on CT. She underwent paracentesis yesterday by IR, and drained 2.9 liters.  Today plain film show Multiple loops of dilated bowel raise concern for a degree of bowel obstruction. No free air evident. There is moderate stool in the colon. She is passing flatus. We are ask to see. She underwent oophorectomy in [place] and finished chemo about 3 months ago.    Past Medical History:  Diagnosis Date  . Cancer [facility])     Past Surgical History:  Procedure Laterality Date  . ABDOMINAL HYSTERECTOMY      History reviewed. No pertinent family history.  Social History:  reports that she has never smoked. She has never used smokeless tobacco. She reports that she does not drink alcohol. Her drug history is not on file.  Allergies: No Known Allergies  Medications:  Prior to Admission:  Prescriptions Prior to Admission  Medication Sig Dispense Refill Last Dose  . acetaminophen  (TYLENOL) 325 MG tablet Take 650 mg by mouth every 5 (five) hours as needed for moderate pain.   05/20/2016 at 0900  . magnesium 30 MG tablet Take 30 mg by mouth 2 (two) times daily.   05/20/2016 at Unknown time  . Potassium 75 MG TABS Take 75 mg by mouth daily.   05/20/2016 at Unknown time  . simethicone (GAS-X) 80 MG chewable tablet Chew 160 mg by mouth every 6 (six) hours as needed for flatulence.   05/20/2016 at 0800   Scheduled: . enoxaparin (LOVENOX) injection  40 mg Subcutaneous Q24H  . feeding supplement (ENSURE ENLIVE)  237 mL Oral BID BM  . ondansetron (ZOFRAN) IV  8 mg Intravenous Q8H  . pantoprazole (PROTONIX) IV  40 mg Intravenous Q24H  . senna-docusate  1 tablet Oral BID  . zolpidem  5 mg Oral Once   Continuous: . sodium chloride 75 mL/hr at 05/23/16 1010   [id number], alum & mag hydroxide-simeth, hydrALAZINE, LORazepam, morphine injection, oxyCODONE-acetaminophen, promethazine, simethicone Anti-infectives    None      Results for orders placed or performed during the hospital encounter of 05/20/16 (from the past 48 hour(s))  Albumin, pleural or peritoneal fluid     Status: None   Collection Time: 05/22/16 11:50 AM  Result Value Ref Range   Albumin, Fluid 2.7 g/dL    Comment: (NOTE) No normal range established for this test Results should be evaluated in conjunction with serum values Performed at [facility]    Fluid Type-FALB ASCITIES   Lactate dehydrogenase (CSF, pleural or peritoneal fluid)  Status: Abnormal   Collection Time: 05/22/16 11:50 AM  Result Value Ref Range   LD, Fluid 1,000 (H) 3 - 23 U/L    Comment: (NOTE) Results should be evaluated in conjunction with serum values Performed at [facility]    Fluid Type-FLDH ASCITIES   Protein, pleural or peritoneal fluid     Status: None   Collection Time: 05/22/16 11:50 AM  Result Value Ref Range   Total protein, fluid 4.2 g/dL    Comment: (NOTE) No normal range established for  this test Results should be evaluated in conjunction with serum values Performed at [facility]    Fluid Type-FTP ASCITIES   Body fluid cell count with differential     Status: Abnormal   Collection Time: 05/22/16 11:50 AM  Result Value Ref Range   Fluid Type-FCT ASCITIES    Color, Fluid YELLOW YELLOW   Appearance, Fluid CLOUDY (A) CLEAR   WBC, Fluid 1,514 (H) 0 - 1,000 cu mm   Neutrophil Count, Fluid 9 0 - 25 %   Lymphs, Fluid 48 %   Monocyte-Macrophage-Serous Fluid 43 (L) 50 - 90 %   Other Cells, Fluid CORRELATE WITH CYTOLOGY. %  Glucose, pleural or peritoneal fluid     Status: None   Collection Time: 05/22/16 11:50 AM  Result Value Ref Range   Glucose, Fluid 86 mg/dL    Comment: (NOTE) No normal range established for this test Results should be evaluated in conjunction with serum values Performed at [facility]    Fluid Type-FGLU ASCITIES   Gram stain     Status: None   Collection Time: 05/22/16 11:50 AM  Result Value Ref Range   Specimen Description FLUID    Special Requests ASCTIES    Gram Stain      FEW WBC PRESENT,BOTH PMN AND MONONUCLEAR NO ORGANISMS SEEN Performed at [facility]    Report Status 05/22/2016 FINAL     Dg Abd 1 View  Result Date: 05/23/2016 CLINICAL DATA:  Diffuse abdominal pain with nausea and vomiting EXAM: ABDOMEN - 1 VIEW COMPARISON:  CT abdomen and pelvis May 20, 2016 FINDINGS: Multiple loops of dilated bowel raise concern for a degree of bowel obstruction. No free air evident. There is moderate stool in the colon. IMPRESSION: Bowel gas pattern concerning for obstruction.  No free air evident. These results will be called to the ordering clinician or representative by the Radiologist Assistant, and communication documented in the PACS or zVision Dashboard. Electronically Signed   By: Lowella Grip III M.D.   On: 05/23/2016 09:28   US Paracentesis  Result Date: 05/22/2016 INDICATION: Patient with metastatic  ovarian cancer and malignant ascites presents for diagnostic and therapeutic paracentesis. EXAM: ULTRASOUND GUIDED DIAGNOSTIC AND THERAPEUTIC PARACENTESIS MEDICATIONS: 10 mL 1% lidocaine COMPLICATIONS: None immediate. PROCEDURE: Informed written consent was obtained from the patient after a discussion of the risks, benefits and alternatives to treatment. A timeout was performed prior to the initiation of the procedure. Initial ultrasound scanning demonstrates a large amount of ascites within the right lower abdominal quadrant. The right lower abdomen was prepped and draped in the usual sterile fashion. 1% lidocaine was used for local anesthesia. Following this, a 19 gauge, 7-cm, Yueh catheter was introduced. An ultrasound image was saved for documentation purposes. The paracentesis was performed. The catheter was removed and a dressing was applied. The patient tolerated the procedure well without immediate post procedural complication. FINDINGS: A total of approximately 2.9 liters of clear, yellow  fluid was removed. Samples were sent to the laboratory as requested by the clinical team. IMPRESSION: Successful ultrasound-guided paracentesis yielding 2.9 liters of peritoneal fluid. Read by:  Brynda Greathouse PA-C Electronically Signed   By: Corrie Mckusick D.O.   On: 05/22/2016 12:49    Review of Systems  Constitutional: Positive for weight loss.  HENT: Negative.   Eyes: Negative.   Respiratory: Negative.   Cardiovascular: Negative.   Gastrointestinal: Positive for abdominal pain, nausea and vomiting.  Genitourinary: Negative.   Musculoskeletal: Negative.   Skin: Negative.   Neurological: Negative.   Endo/Heme/Allergies: Negative.   Psychiatric/Behavioral: Negative.    Blood pressure 131/74, pulse 85, temperature 99.3 F (37.4 C), temperature source Oral, resp. rate 18, height 5\' 3"  (1.6 m), weight 80.2 kg (176 lb 12.9 oz), SpO2 94 %. Physical Exam  Constitutional: She is oriented to person, place, and  time. She appears well-developed and well-nourished.  HENT:  Head: Normocephalic and atraumatic.  Eyes: Conjunctivae and EOM are normal. Pupils are equal, round, and reactive to light.  Neck: Normal range of motion. Neck supple.  Cardiovascular: Normal rate, regular rhythm and normal heart sounds.   Respiratory: Effort normal and breath sounds normal.  GI: Soft.  There is minimal tenderness and only mild distension with good bs  Musculoskeletal: Normal range of motion.  Neurological: She is alert and oriented to person, place, and time.  Skin: Skin is warm and dry.  Psychiatric: She has a normal mood and affect. Her behavior is normal.    Assessment/Plan: The patient has had rapidly progressive metastatic ovarian disease that has developed despite recent chemotherapy. She now has a possible partial sbo vs some bowel dysfunction secondary to the amount of peritoneal disease. At this point I feel her only chance to give her some time may be if some of the tumor could be debulked. If not her prognosis is poor. I would recommend having a GYN oncologist evaluate her. I have spoken to the family because of the language barrier and they have not told her what is going on yet. I have encouraged them to be honest with her so that decisions can be made involving her ongoing care. Will follow  JENNINGS,WILLARD 05/23/2016, 10:33 AM

## 2016-05-23 NOTE — Progress Notes (Signed)
Initial Nutrition Assessment  DOCUMENTATION CODES:   Non-severe (moderate) malnutrition in context of chronic illness  INTERVENTION:   Diet advancement per MD RD will continue to monitor for diet advancement vs.need for nutrition support. If patient's diet not advanced by LOS day 7, may need to consider nutrition support.  NUTRITION DIAGNOSIS:   Increased nutrient needs related to cancer and cancer related treatments as evidenced by estimated needs.  GOAL:   Patient will meet greater than or equal to 90% of their needs  MONITOR:   Diet advancement, Labs, Weight trends, I & O's  REASON FOR ASSESSMENT:   Malnutrition Screening Tool    ASSESSMENT:  75 y.o patient With H/o metastatic ovarian cancer s/o surgery and chemo, last chemo in 02/2016 ,she  recently moved from [place]  A few weeks ago. She started to have progressive abdominal bloating and pain in the last three weeks  12/18 s/p paracentesis, yield: 2.9L  Patient in room with daughter at bedside. Pt is spanish-speaking and pt's daughter served as Optometrist. Pt was on a full liquid diet until this morning when pt was found to have a bowel obstruction. Pt was unable to tolerate full liquids and supplements. Pt reports poor appetite and denies swallowing, chewing or taste changes. Pt reports some days she does not eat anything d/t not being able to tolerate. Per chart review, pt c/o 1-2 weeks of fullness.  Yesterday pt consumed a few spoonfuls of corn and soup.   Pt reports UBW of 165 lb. Pt has increased and this most likely d/t fluid. Nutrition-Focused physical exam completed. Findings are mild fat depletion, moderate muscle depletion, and no edema.   Labs reviewed. Medications: IV Zofran every 8 hours, IV Protonix daily, Senokot-S tablet BID  Diet Order:  Diet NPO time specified  Skin:  Reviewed, no issues  Last BM:  12/15  Height:   Ht Readings from Last 1 Encounters:  05/20/16 5\' 3"  (1.6 m)     Weight:   Wt Readings from Last 1 Encounters:  05/20/16 176 lb 12.9 oz (80.2 kg)    Ideal Body Weight:  52.3 kg  BMI:  Body mass index is 31.32 kg/m.  Estimated Nutritional Needs:   Kcal:  [id number]  Protein:  75-85g  Fluid:  1.7-1.9L/day  EDUCATION NEEDS:   No education needs identified at this time  Clayton Bibles, MS, RD, LDN Pager: [phone] After Hours Pager: [phone]

## 2016-05-23 NOTE — Progress Notes (Addendum)
PROGRESS NOTE  Audrey Garza [id number] DOB: 1940-07-13 DOA: 05/20/2016 PCP: No primary care provider on file.  Brief Summary:  With H/o metastatic ovarian cancer s/o surgery and chemo, last chemo in 02/2016 ,she  recently moved from [place] to [place]  A few weeks ago. She started to have progressive abdominal bloating and pain in the last three weeks, she denies fever, no n/v, no diarrhea. No chest pain or sob.  ED course: her vital is stable, basic labs with mild thrombocytosis plt 464, mild hyponatremia, sodium 134, ua with large leukocytes but no bacteria. CT ab/pel: Extensive peritoneal disease with omental thickening and large volume ascites. 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 Trace bilateral pleural effusions. Moderate hiatal hernia. Bilateral inguinal hernias containing fat.Left colonic diverticulosis.  Aortoiliac atherosclerosis.  hospitalist called to observe this patient in the hospital for pain control and diagnostic and therapeutic paracentesis.   Dr Marko Plume consulted for metastatic ovarinan cancer with carcinomatosis She is s/p paracentesis on 12/18, developed n/v/abpain, found to have bowel obstruction, on ng suction, general surgery consulted.  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 She still seems to have good performance status, gyn onc Dr Marko Plume consulted,  ca 125 elevated, S/p paracentesis on 12/18 with 2.9 litter fluids removed. Cytology pending  n/v/abdomianl pain,  ct abdomen on admission did showed hiatal hernia, family request gi consult Gi  symptom worsened on 12/19, STAT KUB + bowel obstruction, keep npo, start ivf, ng suction, antiemetics, analgesics, General surgery consult.   Mild hyponatremia: monitor sodium on on ivf  Mild thrombocytosis: likely reactive.    DVT prophylaxis: lovenox  Code Status: full  Family Communication: patient , grandson  at bedside  Disposition Plan: pending   Consultants:  oncology Dr Marko Plume , Dr Berniece Pap gyn onc consult as well.  General surgery    Procedures:  Diagnostic and therapeutic Paracentesis on 12/18  Antibiotics:  none   Objective: BP 131/74 (BP Location: Left Arm)   Pulse 85   Temp 99.3 F (37.4 C) (Oral)   Resp 18   Ht 5\' 3"  (1.6 m)   Wt 80.2 kg (176 lb 12.9 oz)   SpO2 94%   BMI 31.32 kg/m   Intake/Output Summary (Last 24 hours) at 05/23/16 0906 Last data filed at 05/22/16 2100  Gross per 24 hour  Intake              480 ml  Output                0 ml  Net              480 ml   Filed Weights   05/20/16 1332 05/20/16 1818  Weight: 78 kg (171 lb 15.3 oz) 80.2 kg (176 lb 12.9 oz)    Exam:   General:  Does not look comfortable, burping, feeling nauseous  Cardiovascular: RRR  Respiratory: CTABL  Abdomen: distended, tender, decreased bowel sounds  Musculoskeletal: No Edema  Neuro: aaox3  Data Reviewed: Basic Metabolic Panel:  Recent Labs Lab 05/20/16 1336 05/21/16 0351  NA 134* 135  K 4.4 4.6  CL 99* 101  CO2 27 29  GLUCOSE 116* 113*  BUN 13 11  CREATININE 0.82 0.87  CALCIUM 8.8* 8.8*   Liver Function Tests:  Recent Labs Lab 05/20/16 1336  AST 25  ALT 18  ALKPHOS 73  BILITOT 0.6  PROT 6.7  ALBUMIN 3.7   No results for input(s): LIPASE, AMYLASE in the last 168 hours. No results for input(s): AMMONIA in the last 168 hours. CBC:  Recent Labs Lab 05/20/16 1336  WBC 8.8  NEUTROABS 5.4  HGB 12.2  HCT 36.6  MCV 88.2  PLT 464*   Cardiac Enzymes:   No results for input(s): CKTOTAL, CKMB,  CKMBINDEX, TROPONINI in the last 168 hours. BNP (last 3 results) No results for input(s): BNP in the last 8760 hours.  ProBNP (last 3 results) No results for input(s): PROBNP in the last 8760 hours.  CBG: No results for input(s): GLUCAP in the last 168 hours.  Recent Results (from the past 240 hour(s))  Gram stain     Status: None   Collection Time: 05/22/16 11:50 AM  Result Value Ref Range Status   Specimen Description FLUID  Final   Special Requests ASCTIES  Final   Gram Stain   Final    FEW WBC PRESENT,BOTH PMN AND MONONUCLEAR NO ORGANISMS SEEN Performed at [facility]    Report Status 05/22/2016 FINAL  Final     Studies: US Paracentesis  Result Date: 05/22/2016 INDICATION: Patient with metastatic ovarian cancer and malignant ascites presents for diagnostic and therapeutic paracentesis. EXAM: ULTRASOUND GUIDED DIAGNOSTIC AND THERAPEUTIC PARACENTESIS MEDICATIONS: 10 mL 1% lidocaine COMPLICATIONS: None immediate. PROCEDURE: Informed written consent was obtained from the patient after a discussion of the risks, benefits and alternatives to treatment. A timeout was performed prior to the initiation of the procedure. Initial ultrasound scanning demonstrates a large amount of ascites within the right lower abdominal quadrant. The right lower abdomen was prepped and draped in the usual sterile fashion. 1% lidocaine was used for local anesthesia. Following this, a 19 gauge, 7-cm, Yueh catheter was introduced. An ultrasound image was saved for documentation purposes. The paracentesis was performed. The catheter was removed and a dressing was applied. The patient tolerated the procedure well without immediate post procedural complication. FINDINGS: A total of approximately 2.9 liters of clear, yellow fluid was removed. Samples were sent to the laboratory as requested by the clinical team. IMPRESSION: Successful ultrasound-guided paracentesis yielding 2.9 liters of peritoneal fluid. Read  by:  Brynda Greathouse PA-C Electronically Signed   By: Corrie Mckusick D.O.   On: 05/22/2016 12:49    Scheduled Meds: . enoxaparin (LOVENOX) injection  40 mg Subcutaneous Q24H  . feeding supplement (ENSURE ENLIVE)  237 mL Oral BID BM  . pantoprazole  20 mg Oral Daily  . senna-docusate  1 tablet Oral BID  . zolpidem  5 mg Oral Once    Continuous Infusions: . sodium chloride       Time spent: 76mins  Umi Mainor MD, PhD  [facility] Pager [phone]. If 7PM-7AM, please contact night-coverage at [email or web address], password [facility] 05/23/2016, 9:06 AM  LOS: 2 days

## 2016-05-23 NOTE — Consult Note (Addendum)
Consult Note: Gyn-Onc  Consult was requested by Dr. Florencia Reasons for the evaluation of Audrey Garza 75 y.o. female  CC: tumor ileus/malignant bowel obstruction   Assessment/Plan:  Ms. Audrey Garza  is a 75 y.o.  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  I performed a history, physical examination, and personally reviewed the patient's imaging films including CT scan from 05/20/16. 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   I discussed with [name] and her daughter that she does not have a curable cancer, and that given the platinum resistance of her cancer the intent of therapy is palliative and average life expectancy is approximately 6 months. I discussed that there is no role for surgery in the platinum resistant setting with carcinomatosis unless to consider a palliative procedure such as G tube placement.   I discussed that the only treatment option at this point would be palliative intent chemotherapy. She could be considered for this after her acute obstructive symptoms are improved. I discussed that it is reasonable to consider no further therapy, and to implement palliative care and hospice, given that there is no curative option and life expectancy is less than 6 months. The patient is electing for chemotherapy.  With respect to her symptom management and malignant bowel obstruction:  1/ continue NGT and IVF and NPO until more consistent passage of flatus and low NG output (eg <300 per day). 2/ consider aspiration of more ascitic fluid for comfort 3/ consider rectal suppository. 4/ once NGT is removed after consistent passage of flatus +/- BM, would consider slow advancement of diet. I discussed with Glenyce that some patients with carcinomatosis never regain a normal  oral intake. 5/ if she fails advancement of diet, she should be considered for palliative G tube. Based on her imaging a percutaneous effort may not be successful as it appears that the splenic flexure of colon overlies the stomach.  Will discuss case with Dr Marko Plume and consider timing of first dose of salvage palliative chemotherapy (likely Doxil).  HPI: Audrey Garza is a 75 year old woman for [place] who is seen in consultation at the request of Dr Florencia Reasons for recurrent ovarian cancer and carcinomatosis.  The patient reports being diagnosed with stage III ovarian cancer in January, 2017. She received primary debulking surgery followed by 8 adjuvant cycles of carboplatin and paclitaxel followed by an additional 4 cycles of what she describes as maintenance carboplatin, paclitaxel and avastin. Her last dose of therapy was in September, 2017 and was discontinued when they "ran out of drugs to treat her with in [place]". She says that they performed CT imaging at that time which revealed complete response.   She left [place] in October, 2017 and permanently immigrated to the [place] where she lives with her daughter who is legally blind. She was feeling well until the first week of December, 2017 when she was sick with a virus from which she never recovered. She states that she began feeling distended in the abdomen and uncomfortable and was taken to the ED on 05/20/16.  In the ED at [facility], CT abdo/pelvis was obtained that showed large volume ascites, bulky carcinomatosis, dilated loops of small bowel. There was no apparent colonic obstruction with air and gas in the colon.   She was admitted  and made NPO with IVF. She had emesis on the morning of 05/23/16 and an NGT was placed. On 05/22/16 she had paracentesis for 2 L of fluid. Cytology revealed adenocarcinoma [facility] pending).  The patient continues to pass flatus in small volumes. No BM for 2-3 days.   Current  Meds:  Current Facility-Administered Medications:  .  0.9 %  sodium chloride infusion, , Intravenous, Continuous, Florencia Reasons, MD, Last Rate: 75 mL/hr at 05/23/16 1010 .  acetaminophen (TYLENOL) tablet 650 mg, 650 mg, Oral, Q5H PRN, Florencia Reasons, MD .  alum & mag hydroxide-simeth (MAALOX/MYLANTA) 200-200-20 MG/5ML suspension 60 mL, 60 mL, Oral, Q4H PRN, Florencia Reasons, MD, 60 mL at 05/22/16 1838 .  enoxaparin (LOVENOX) injection 40 mg, 40 mg, Subcutaneous, Q24H, Florencia Reasons, MD, 40 mg at 05/22/16 2220 .  feeding supplement (ENSURE ENLIVE) (ENSURE ENLIVE) liquid 237 mL, 237 mL, Oral, BID BM, Florencia Reasons, MD, 237 mL at 05/22/16 1400 .  hydrALAZINE (APRESOLINE) injection 10 mg, 10 mg, Intravenous, Q6H PRN, Florencia Reasons, MD .  LORazepam (ATIVAN) injection 0.5-1 mg, 0.5-1 mg, Intravenous, Q6H PRN, Lennis Marion Downer, MD .  morphine 2 MG/ML injection 2 mg, 2 mg, Intravenous, Q4H PRN, Florencia Reasons, MD, 2 mg at 05/22/16 2230 .  ondansetron (ZOFRAN) 8 mg in sodium chloride 0.9 % 50 mL IVPB, 8 mg, Intravenous, Q8H PRN, Lennis P Livesay, MD .  oxyCODONE-acetaminophen (PERCOCET/ROXICET) 5-325 MG per tablet 1 tablet, 1 tablet, Oral, Q4H PRN, Florencia Reasons, MD, 1 tablet at 05/22/16 2032 .  pantoprazole (PROTONIX) injection 40 mg, 40 mg, Intravenous, Q24H, Lennis P Livesay, MD, 40 mg at 05/23/16 1130 .  promethazine (PHENERGAN) injection 6.25 mg, 6.25 mg, Intravenous, Q4H PRN, Florencia Reasons, MD, 6.25 mg at 05/20/16 1826 .  senna-docusate (Senokot-S) tablet 1 tablet, 1 tablet, Oral, BID, Florencia Reasons, MD, 1 tablet at 05/22/16 2220 .  simethicone (MYLICON) chewable tablet 160 mg, 160 mg, Oral, Q6H PRN, Florencia Reasons, MD .  zolpidem (AMBIEN) tablet 5 mg, 5 mg, Oral, Once, Rise Patience, MD   Allergy: No Known Allergies  Social Hx:   Social History   Social History  . Marital status: Unknown    Spouse name: N/A  . Number of children: N/A  . Years of education: N/A   Occupational History  . Not on file.   Social History Main Topics  . Smoking status:  Never Smoker  . Smokeless tobacco: Never Used  . Alcohol use No  . Drug use: Unknown  . Sexual activity: Not on file   Other Topics Concern  . Not on file   Social History Narrative  . No narrative on file    Past Surgical Hx:  Past Surgical History:  Procedure Laterality Date  . ABDOMINAL HYSTERECTOMY      Past Medical Hx:  Past Medical History:  Diagnosis Date  . Cancer [facility])     Past Gynecological History:  Ovarian cancer, s/p TAH, BSO debulking January 2017.  No LMP recorded. Patient has had a hysterectomy.  Family Hx: History reviewed. No pertinent family history.  Review of Systems:  Constitutional  Feels generally unwell,    ENT Normal appearing ears and nares bilaterally Skin/Breast  No rash, sores, jaundice, itching, dryness Cardiovascular  No chest pain, shortness of breath, or edema  Pulmonary  No cough or wheeze.  Gastro Intestinal  + vomiting, distended. No BM Genito Urinary  No frequency, urgency, dysuria,  Musculo Skeletal  No myalgia, arthralgia, joint swelling or pain  Neurologic  No weakness, numbness, change in gait,  Psychology  No depression, anxiety, insomnia.   Vitals:  Blood pressure 134/76, pulse 92, temperature 98.9 F (37.2 C), temperature source Oral, resp. rate 16, height 5\' 3"  (1.6 m), weight 176 lb 12.9 oz (80.2 kg), SpO2 90 %.  Physical Exam: WD in NAD Neck  Supple NROM, without any enlargements.  Lymph Node Survey No cervical supraclavicular or inguinal adenopathy Cardiovascular  Pulse normal rate, regularity and rhythm. S1 and S2 normal.  Lungs  Clear to auscultation bilateraly, without wheezes/crackles/rhonchi. Good air movement.  Skin  No rash/lesions/breakdown  Psychiatry  Alert and oriented to person, place, and time  Abdomen  Normoactive bowel sounds, abdomen soft, non-tender and nonobese without evidence of hernia. Distended. Dull to  Percuss with fluid wave present. Back No CVA tenderness Genito Urinary   deferred Rectal  deferred  Extremities  No bilateral cyanosis, clubbing or edema.  CMP Latest Ref Rng & Units 05/21/2016 05/20/2016  Glucose 65 - 99 mg/dL 113(H) 116(H)  BUN 6 - 20 mg/dL 11 13  Creatinine 0.44 - 1.00 mg/dL 0.87 0.82  Sodium 135 - 145 mmol/L 135 134(L)  Potassium 3.5 - 5.1 mmol/L 4.6 4.4  Chloride 101 - 111 mmol/L 101 99(L)  CO2 22 - 32 mmol/L 29 27  Calcium 8.9 - 10.3 mg/dL 8.8(L) 8.8(L)  Total Protein 6.5 - 8.1 g/dL - 6.7  Total Bilirubin 0.3 - 1.2 mg/dL - 0.6  Alkaline Phos 38 - 126 U/L - 73  AST 15 - 41 U/L - 25  ALT 14 - 54 U/L - 18   CBC    Component Value Date/Time   WBC 8.8 05/20/2016 1336   RBC 4.15 05/20/2016 1336   HGB 12.2 05/20/2016 1336   HCT 36.6 05/20/2016 1336   PLT 464 (H) 05/20/2016 1336   MCV 88.2 05/20/2016 1336   MCH 29.4 05/20/2016 1336   MCHC 33.3 05/20/2016 1336   RDW 12.6 05/20/2016 1336   LYMPHSABS 2.4 05/20/2016 1336   MONOABS 0.9 05/20/2016 1336   EOSABS 0.1 05/20/2016 1336   BASOSABS 0.0 05/20/2016 1336    CA 125 250 (05/21/16)  60 minutes of face to face counseling was spent with the patient including review of imaging and results, discussion of prognosis and treatment options.   Donaciano Eva, MD  ([phone]) 05/23/2016, 4:14 PM

## 2016-05-23 NOTE — Consult Note (Signed)
MEDICAL ONCOLOGY  Reason for Consult: abdominal carcinomatosis and ascites, history of ovarian cancer Referring Physician: [facility]  Information from medical records accompanying from [place], patient and family, and information from present admission. The outside medical records in Spanish have been reviewed in detail with assistance of interpreter.  Patient seen with one daughter last pm; seen with another daughter and adult grandson now. Family very supportive.  Audrey Garza is 75 y.o. Spanish speaking lady with history of ovarian cancer diagnosed 03-2015 and treated in [place] thru ~ 02-02-16.   She recently came from [place] to [facility], where she plans to stay with daughters. She was admitted thru ED on 05-20-16 with 1-2 weeks of progressive abdominal distension and generalized abdominal soreness, with poor po intake and some vomiting. CT AP on admission showed ascites and peritoneal carcinomatosis.  Patient had hysterectomy without oophorectomy in her 52s for benign indication, and been in her usual good health until July 2016, when she developed abdominal discomfort and bloating. In late 03-2015 she was found to have large pelvic mass and perihepatic ascites. CA 125 then was 359, with CEA of 3, CA 19-9 5.8 and alpha fetoprotein 2.3. She had exploratory laparotomy 04-19-15, which was not done by gyn oncologist, with bilateral ovaried an tumors, 10 cm with capsule ruptured on right , smaller on left, peritoneal carcinomatosis. Washings were done, however accompanying information does not describe extent of debulking. She had some bowel obstruction post operatively, with NG used ~ POD 3. Pathology available 05-05-15 showed cystic and solid left ovarian mass, 13 cm right ovarian mass, moderately differentiated tumor "tumor de celulas de la granulosa". 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 CT AP 122016 had  ascites, carcinomatosis and multiple peritoneal implants. CA 125 on 05-24-15 baseline for chemo was 141. She received 3 cycles of taxol + carboplatin from 05-26-15 thru 07-07-15 (not clear if avastin given those cycles), after which carboplatin was not available in [place]. She received taxol 175 mg/m2, CDDP and avastin for 3 additional cycles thru 10-07-15. Second look laparotomy was discussed, patient declined, and instead had 4 cycles avastin + taxol from 12-01-15 thru 02-02-16 (family is confirming, but we believe no CDDP after 10-07-15 and no carboplatin after 07-07-15).  CT CAP 02-24-16 reportedly had no evidence of malignancy, with some bullous emphysema, tortuous sigmoid colon with diverticulosis. . Recommendation was for maintenance avastin x 1 year, however patient then came to [facility] in 02-2016. She has not established with any physicians since coming to [place]. CT AP 05-20-16 lias arge volume ascites, extensive peritoneal disease with omental thickening, trace bilateral pleural effusions, left colonic diverticulosis, bilateral inguinal hernias containing fat, post hysterectomy, no adnexal masses, no adenopathy. US paracentesis 05-22-16 for 2.9 liters of yellow fluid. Cytology available this AM is malignant per my phone conversation with Dr Luisa Hart, looks serous, immunohistochemical stains request ed               ROS  Respiratory infection with cough x 1 week, resolved ~ 3 weeks ago. d Good appetite and feeling well until last 1-2 weeks (after the respiratory infection), when developed abdominal distension and fullness, and generalized abdominal soreness, with weight up ~ 5 lbs. . She was able to take only a few spoonfuls of liquids for 2 days prior to coming to ED on 05-20-16, with vomiting. Bowels moved last on 05-22-16. She is belching and has passed flatus. Last vomiting was this AM. She had more abdominal  discomfort following paracentesis until vomited this AM, improved now.  No HA, good visual acuity with  reading glasses. No known dental problems. No history of thyroid disease. No SOB or cough now, no chest pain. No palpitations or other cardiac symptoms. Mammograms done in [place] 02-2016, no noted changes in breasts. Is voiding. No bleeding. No LE swelling. No peripheral neuropathy from taxol or CDDP. Peripheral IV access not difficult for chemotherapy. No arthritis. She has felt inguinal "swelling" L>R (CT fat containing inguinal hernias).   Allergies: No Known Allergies   Past Medical/ Surgical History 4 children No known medical problems prior to ovarian cancer Hysterectomy without oophorectomy in her 31s    Family History: Father and brother died of MIs Sister died of multiple myeloma age 76 Brother "cancer in eye" treated with curative removal of eye Son died age 63 following surgery for tumor ? Base of skull or brain "not cancer" 2 sons and 2 daughters healthy  Social History:  From [place], widowed. Has not worked outside of home. No tobacco or ETOH. 1 daughter Rondall Allegra, 1 daughter [place], 1 son [place], 13 grands, 2 great grands.  No [place] insurance.  Family here fluent [place] and Spanish  Medications: Reviewed as listed in EMR. Increase zofran to 8 mg q 8 hrs prn. Change protonix to IV  Blood pressure 131/74, pulse 85, temperature 99.3 F (37.4 C), temperature source Oral, resp. rate 18, height '5\' 3"'$  (1.6 m), weight 176 lb 12.9 oz (80.2 kg), SpO2 94 %. Physical Exam Very pleasant lady, appears generally strong and well-nourished, looks younger than stated age. Speaks Spanish No alopecia, hair short as recently growing back from last chemo. PERRL, not icteric. Oral mucosa moist and clear. Neck supple without JVD or thyroid mass. Lungs with decreased BS just in bases bilaterally, no wheezes or rales. Heart RRR no gallop, clear heart sounds. No peripheral adenopathy cervical, supraclavicular, axillary, inguinal. Abdomen soft, generally tender last pm and improved  now, some fluid wave apparent, quiet. Cannot appreciate HSM or mass. LE no pitting edema, cords, tenderness. Peripheral NSL LUE site ok. Veins UE look easily accessible. Good muscle mass thruout. Speech fluent, moves all extremities and sits up in bed without assistance. Neuro CN, motor, sensory, cerebellar nonfocal. PSYCH affect and mood seem appropriate.    Labs: CBC BMET pending today  CA 125  05-21-16 249.8  CBC  WBC 8.8, Hgb 12.2, plt 464, ANC 5.4, differential unremarkable CMET 05-20-16 with NA 134, K 4.4, Cl 99, glu 116, creat 0.82, BUN 13, Ca 8.8, Tprot 6.7, alb 3.7, AST 25, ALT 18, AP 73, Tbili 0.6 UA 12-16   Large leukocytes, negative blood, 6-30 WBC  PATHOLOGY FINAL for IVIANA, BLASINGAME ([id number]) Patient: ANABELL, SWINT Collected: 05/22/2016 Client: [facility] Accession: [id number] Received: 05/22/2016 Rowe Robert, PA-C CYTOPATHOLOGY REPORT Adequacy Reason Satisfactory for evaluation. Diagnosis PERITONEAL/ASCITIC FLUID, 1 OF 1 COLLECTED ON 05/22/16: ADENOCARCINOMA. Claudette Laws MD Pathologist, Electronic Signature (Case signed 05/23/2016) Specimen Clinical Information H/O Ovarian CA in [place] in past year. Surgery and Chemotherapy completed in 02/2016 Source Peritoneal/Ascitic Fluid, (specimen 1 of 1 collected 05/22/16) Gross Specimen: Received is/are 1400 cc's of tan fluid. (BS:bs) Prepared: # Smears: 0 # Concentration Technique Slides (i.e. ThinPrep): 1 # Cell Block: 1 Additional Studies: Also received one Hematology slide labeled [id number]. Comment Comment: Immunohistochemistry will be performed and reported as an addendum. Case discussed with Dr. Marko Plume on 05/23/16.  Discussed above information with Dr Saralyn Pilar now. Large cell block obtained, features suggest  serous carcinoma. IHC to be done.  US Paracentesis  Result Date: 05/22/2016 INDICATION: Patient with metastatic ovarian cancer and malignant ascites presents for diagnostic and therapeutic  paracentesis. EXAM: ULTRASOUND GUIDED DIAGNOSTIC AND THERAPEUTIC PARACENTESIS MEDICATIONS: 10 mL 1% lidocaine COMPLICATIONS: None immediate. PROCEDURE: Informed written consent was obtained from the patient after a discussion of the risks, benefits and alternatives to treatment. A timeout was performed prior to the initiation of the procedure. Initial ultrasound scanning demonstrates a large amount of ascites within the right lower abdominal quadrant. The right lower abdomen was prepped and draped in the usual sterile fashion. 1% lidocaine was used for local anesthesia. Following this, a 19 gauge, 7-cm, Yueh catheter was introduced. An ultrasound image was saved for documentation purposes. The paracentesis was performed. The catheter was removed and a dressing was applied. The patient tolerated the procedure well without immediate post procedural complication. FINDINGS: A total of approximately 2.9 liters of clear, yellow fluid was removed. Samples were sent to the laboratory as requested by the clinical team. IMPRESSION: Successful ultrasound-guided paracentesis yielding 2.9 liters of peritoneal fluid. Read by:  Brynda Greathouse PA-C Electronically Signed   By: Corrie Mckusick D.O.   On: 05/22/2016 12:49    EXAM: CT ABDOMEN AND PELVIS WITH CONTRAST  TECHNIQUE: Multidetector CT imaging of the abdomen and pelvis was performed using the standard protocol following bolus administration of intravenous contrast.  CONTRAST:  162m ISOVUE-300 IOPAMIDOL (ISOVUE-300) INJECTION 61%  COMPARISON:  None.  FINDINGS: Lower chest: Bibasilar atelectasis. Heart is normal size. Moderate-sized hiatal hernia. Trace bilateral effusions.  Hepatobiliary: No focal hepatic abnormality. Gallbladder unremarkable.  Pancreas: No focal abnormality or ductal dilatation.  Spleen: No focal abnormality.  Normal size.  Adrenals/Urinary Tract: Small cyst in the midpole of the left kidney. No hydronephrosis. Adrenal glands  and urinary bladder unremarkable.  Stomach/Bowel: Descending colonic and sigmoid diverticulosis. No active diverticulitis. Stomach and small bowel unremarkable.  Vascular/Lymphatic: Aortic and iliac calcifications. No aneurysm. No adenopathy.  Reproductive: Prior hysterectomy.  No adnexal masses.  Other: There is large volume ascites in the abdomen or pelvis. Extensive abnormal soft tissue throughout the omentum R with peritoneal malignancy. No free air. Bilateral inguinal hernias containing fat.  Musculoskeletal: No acute bony abnormality or focal bone lesion.  IMPRESSION: Extensive peritoneal disease with omental thickening/ 18 and large volume ascites. 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  Trace bilateral pleural effusions.  Moderate hiatal hernia.  Bilateral inguinal hernias containing fat.  Left colonic diverticulosis.  Aortoiliac atherosclerosis.  PACs images reviewed by MD   ABDOMEN - 1 VIEW  05-23-16  COMPARISON:  CT abdomen and pelvis May 20, 2016  FINDINGS: Multiple loops of dilated bowel raise concern for a degree of bowel obstruction. No free air evident. There is moderate stool in the colon.  IMPRESSION: Bowel gas pattern concerning for obstruction.  No free air evident.  PACs images of abdominal Xray reviewed with patient and family  DISCUSSION: History obtained from patient and daughter last pm. I have now discussed with patient and [place].  As she likely has not had platinum chemotherapy since 10-2015 ( carboplatin x 3 doses from 05-2015 thru 07-07-15 and CDDP x 3 cycles thru 10-07-15, confirming if one additional dose CDDP summer 2017) , it may be that she is not platinum resistant, and that carboplatin with other agent may be useful for this recurrent disease. I have told patient and daughter  that this  appears to be aggressive malignancy, but still may improve with further systemic treatment.  CA125 appears useful marker.  She may need additional [place] paracenteses until hopefully improvement with additional chemo.  She does not have any peripheral neuropathy to contraindicate further taxane, tho last treated with taxane 02-02-16 (every 3 week taxol regimen, could try weekly).   Peripheral IV access appears adequate, tho would need PAC if gemzar or doxil.  Will need genetics testing outpatient not discussed yet.  Concern now for bowel obstruction vs significant ileus. Recommend NPO, may need NG, would start gentle IVF to prevent dehydration, labs pending. Agree with general surgery consult.  Gyn oncology likely available to consult in hospital if needed. Would be best to improve this acute bowel situation prior to chemo. If she improves to allow DC, chemo can be done outpatient.  Needs financial counselors in hospital and at [facility] to see if any options for coverage for chemo and prescriptions.  I will make [facility] financial staff aware.  Assessment/Plan: 1.advanced ovarian carcinoma: appears IIIC, likely high grade serous by cytology now, by history bilateral ovarian. Progressive within 4 months of last systemic treatment, which seems to have been taxol + avastin 02-02-16; confirming that last CDDP was 10-2015, last carboplatin was 07-07-15. Has become rapidly symptomatic with malignant ascites and peritoneal carcinomatosis. Post paracentesis 2.9 liters on 05-22-16. When bowel issues clarified/ addressed, expect to begin chemotherapy, agents to be decided when most recent regimen confirmed. 2.bowel obstruction vs ileus: some vomiting x 5 days, bowels moved 12-18, abdominal Xray noted. Plan as above 3.Chronic constipation since childhood per patient. 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 Not symptomatic  now. 5.inguinal hernias containing only fat 6.aortic atherosclerosis by CT  All questions answered. Discussed with Dr Erlinda Hong on unit and by phone. Consult called to [facility] gyn oncology after general surgery gave recommendations Time spent 75 min  Will follow with you. Please page [phone] or call [phone] if I can help between my rounds   [facility] May 23, 2016, 9:21 AM

## 2016-05-24 ENCOUNTER — Inpatient Hospital Stay (HOSPITAL_COMMUNITY): Payer: Medicaid Other

## 2016-05-24 ENCOUNTER — Encounter (HOSPITAL_COMMUNITY): Payer: Self-pay | Admitting: Interventional Radiology

## 2016-05-24 DIAGNOSIS — Z5111 Encounter for antineoplastic chemotherapy: Secondary | ICD-10-CM

## 2016-05-24 DIAGNOSIS — K56600 Partial intestinal obstruction, unspecified as to cause: Secondary | ICD-10-CM

## 2016-05-24 HISTORY — PX: IR GENERIC HISTORICAL: IMG1180011

## 2016-05-24 LAB — CBC WITH DIFFERENTIAL/PLATELET
BASOS PCT: 0 %
Basophils Absolute: 0 10*3/uL (ref 0.0–0.1)
EOS ABS: 0 10*3/uL (ref 0.0–0.7)
Eosinophils Relative: 0 %
HCT: 37.5 % (ref 36.0–46.0)
HEMOGLOBIN: 12.4 g/dL (ref 12.0–15.0)
LYMPHS ABS: 1.8 10*3/uL (ref 0.7–4.0)
Lymphocytes Relative: 22 %
MCH: 29.7 pg (ref 26.0–34.0)
MCHC: 33.1 g/dL (ref 30.0–36.0)
MCV: 89.7 fL (ref 78.0–100.0)
Monocytes Absolute: 1 10*3/uL (ref 0.1–1.0)
Monocytes Relative: 13 %
NEUTROS ABS: 5.1 10*3/uL (ref 1.7–7.7)
NEUTROS PCT: 65 %
Platelets: 499 10*3/uL — ABNORMAL HIGH (ref 150–400)
RBC: 4.18 MIL/uL (ref 3.87–5.11)
RDW: 12.7 % (ref 11.5–15.5)
WBC: 8 10*3/uL (ref 4.0–10.5)

## 2016-05-24 LAB — BASIC METABOLIC PANEL
Anion gap: 9 (ref 5–15)
BUN: 21 mg/dL — ABNORMAL HIGH (ref 6–20)
CHLORIDE: 100 mmol/L — AB (ref 101–111)
CO2: 29 mmol/L (ref 22–32)
Calcium: 8.7 mg/dL — ABNORMAL LOW (ref 8.9–10.3)
Creatinine, Ser: 0.9 mg/dL (ref 0.44–1.00)
GFR calc non Af Amer: >60 mL/min (ref >60)
Glucose, Bld: 113 mg/dL — ABNORMAL HIGH (ref 65–99)
POTASSIUM: 4.3 mmol/L (ref 3.5–5.1)
SODIUM: 138 mmol/L (ref 135–145)

## 2016-05-24 LAB — MAGNESIUM: MAGNESIUM: 2.3 mg/dL (ref 1.7–2.4)

## 2016-05-24 MED ORDER — CEFAZOLIN SODIUM-DEXTROSE 2-4 GM/100ML-% IV SOLN
2.0000 g | INTRAVENOUS | Status: AC
  Administered 2016-05-24: 2 g via INTRAVENOUS
  Filled 2016-05-24: qty 100

## 2016-05-24 MED ORDER — FLUMAZENIL 0.5 MG/5ML IV SOLN
INTRAVENOUS | Status: AC
  Filled 2016-05-24: qty 5

## 2016-05-24 MED ORDER — LIDOCAINE-EPINEPHRINE (PF) 2 %-1:200000 IJ SOLN
INTRAMUSCULAR | Status: AC
  Administered 2016-05-24: 10 mL
  Filled 2016-05-24: qty 20

## 2016-05-24 MED ORDER — HEPARIN SOD (PORK) LOCK FLUSH 100 UNIT/ML IV SOLN
INTRAVENOUS | Status: AC
  Filled 2016-05-24: qty 5

## 2016-05-24 MED ORDER — MIDAZOLAM HCL 2 MG/2ML IJ SOLN
INTRAMUSCULAR | Status: AC | PRN
  Administered 2016-05-24: 1 mg via INTRAVENOUS
  Administered 2016-05-24 (×2): 0.5 mg via INTRAVENOUS

## 2016-05-24 MED ORDER — FENTANYL CITRATE (PF) 100 MCG/2ML IJ SOLN
INTRAMUSCULAR | Status: AC | PRN
  Administered 2016-05-24 (×4): 25 ug via INTRAVENOUS

## 2016-05-24 MED ORDER — LIDOCAINE HCL 1 % IJ SOLN
INTRAMUSCULAR | Status: AC
  Administered 2016-05-24: 10 mL
  Filled 2016-05-24: qty 20

## 2016-05-24 MED ORDER — MIDAZOLAM HCL 2 MG/2ML IJ SOLN
INTRAMUSCULAR | Status: AC
  Filled 2016-05-24: qty 4

## 2016-05-24 MED ORDER — FENTANYL CITRATE (PF) 100 MCG/2ML IJ SOLN
INTRAMUSCULAR | Status: AC
  Filled 2016-05-24: qty 4

## 2016-05-24 MED ORDER — NALOXONE HCL 0.4 MG/ML IJ SOLN
INTRAMUSCULAR | Status: AC
  Filled 2016-05-24: qty 1

## 2016-05-24 NOTE — Progress Notes (Signed)
Referring Physician(s): Livesay,L  Supervising Physician: Daryll Brod  Patient Status:  [facility] - In-pt  Chief Complaint:  Ovarian cancer  Subjective: Familiar to IR service from recent paracentesis on 05/22/16. She has a history of advanced ovarian carcinoma and request now received for Port-A-Cath placement for chemotherapy. She currently denies fever, headache, chest pain, worsening dyspnea, nausea vomiting or abnormal bleeding. She does have some abdominal fullness and NG tube in place secondary to SBO. Additional history as below. Past Medical History:  Diagnosis Date  . Cancer [facility])    Past Surgical History:  Procedure Laterality Date  . ABDOMINAL HYSTERECTOMY       Allergies: Patient has no known allergies.  Medications: Prior to Admission medications   Medication Sig Start Date End Date Taking? Authorizing Provider  acetaminophen (TYLENOL) 325 MG tablet Take 650 mg by mouth every 5 (five) hours as needed for moderate pain.   Yes Historical Provider, MD  magnesium 30 MG tablet Take 30 mg by mouth 2 (two) times daily.   Yes Historical Provider, MD  Potassium 75 MG TABS Take 75 mg by mouth daily.   Yes Historical Provider, MD  simethicone (GAS-X) 80 MG chewable tablet Chew 160 mg by mouth every 6 (six) hours as needed for flatulence.   Yes Historical Provider, MD     Vital Signs: BP 137/72 (BP Location: Left Arm)   Pulse 87   Temp 98 F (36.7 C) (Oral)   Resp 16   Ht 5\' 3"  (1.6 m)   Wt 176 lb 12.9 oz (80.2 kg)   SpO2 93%   BMI 31.32 kg/m   Physical Exam patient awake, alert. NG tube in place. Chest with slightly diminished breath sounds bases. Heart with normal rate, occasional ectopy. Abdomen soft, slightly distended, not significantly tender. Lower extremities with no significant edema.  Imaging: Dg Abd 1 View  Result Date: 05/23/2016 CLINICAL DATA:  Assess nasogastric tube positioning. EXAM: ABDOMEN - 1 VIEW COMPARISON:  Chest x-ray of earlier today.  FINDINGS: The nasogastric tube has been partially withdrawn such that the proximal port is at or just below the GE junction. The distal port lies in the proximal gastric fundus. There is a small amount of gas within the stomach. There is moderate gaseous distention of small-bowel loops in the mid abdomen. IMPRESSION: The nasogastric tubes proximal port is at or just below the GE junction. Advancement by 5 cm is recommended to assure that the proximal port does remain below the GE junction. Distended small bowel loops consistent with obstruction. Electronically Signed   By: David  Martinique M.D.   On: 05/23/2016 15:10   Dg Abd 1 View  Result Date: 05/23/2016 CLINICAL DATA:  Diffuse abdominal pain with nausea and vomiting EXAM: ABDOMEN - 1 VIEW COMPARISON:  CT abdomen and pelvis May 20, 2016 FINDINGS: Multiple loops of dilated bowel raise concern for a degree of bowel obstruction. No free air evident. There is moderate stool in the colon. IMPRESSION: Bowel gas pattern concerning for obstruction.  No free air evident. These results will be called to the ordering clinician or representative by the Radiologist Assistant, and communication documented in the PACS or zVision Dashboard. Electronically Signed   By: Lowella Grip III M.D.   On: 05/23/2016 09:28   Ct Abdomen Pelvis W Contrast  Result Date: 05/20/2016 CLINICAL DATA:  Abdominal distention, pain. EXAM: CT ABDOMEN AND PELVIS WITH CONTRAST TECHNIQUE: Multidetector CT imaging of the abdomen and pelvis was performed using the standard protocol following  bolus administration of intravenous contrast. CONTRAST:  15mL ISOVUE-300 IOPAMIDOL (ISOVUE-300) INJECTION 61% COMPARISON:  None. FINDINGS: Lower chest: Bibasilar atelectasis. Heart is normal size. Moderate-sized hiatal hernia. Trace bilateral effusions. Hepatobiliary: No focal hepatic abnormality. Gallbladder unremarkable. Pancreas: No focal abnormality or ductal dilatation. Spleen: No focal  abnormality.  Normal size. Adrenals/Urinary Tract: Small cyst in the midpole of the left kidney. No hydronephrosis. Adrenal glands and urinary bladder unremarkable. Stomach/Bowel: Descending colonic and sigmoid diverticulosis. No active diverticulitis. Stomach and small bowel unremarkable. Vascular/Lymphatic: Aortic and iliac calcifications. No aneurysm. No adenopathy. Reproductive: Prior hysterectomy.  No adnexal masses. Other: There is large volume ascites in the abdomen or pelvis. Extensive abnormal soft tissue throughout the omentum R with peritoneal malignancy. No free air. Bilateral inguinal hernias containing fat. Musculoskeletal: No acute bony abnormality or focal bone lesion. IMPRESSION: Extensive peritoneal disease with omental thickening/ 18 and large volume ascites. 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 Trace bilateral pleural effusions. Moderate hiatal hernia. Bilateral inguinal hernias containing fat. Left colonic diverticulosis. Aortoiliac atherosclerosis. Electronically Signed   By: Rolm Baptise M.D.   On: 05/20/2016 15:10   US Paracentesis  Result Date: 05/22/2016 INDICATION: Patient with metastatic ovarian cancer and malignant ascites presents for diagnostic and therapeutic paracentesis. EXAM: ULTRASOUND GUIDED DIAGNOSTIC AND THERAPEUTIC PARACENTESIS MEDICATIONS: 10 mL 1% lidocaine COMPLICATIONS: None immediate. PROCEDURE: Informed written consent was obtained from the patient after a discussion of the risks, benefits and alternatives to treatment. A timeout was performed prior to the initiation of the procedure. Initial ultrasound scanning demonstrates a large amount of ascites within the right lower abdominal quadrant. The right lower abdomen was prepped and draped in the usual sterile fashion. 1% lidocaine was used for local anesthesia. Following this, a 19 gauge, 7-cm, Yueh catheter was introduced. An ultrasound image was saved for documentation purposes.  The paracentesis was performed. The catheter was removed and a dressing was applied. The patient tolerated the procedure well without immediate post procedural complication. FINDINGS: A total of approximately 2.9 liters of clear, yellow fluid was removed. Samples were sent to the laboratory as requested by the clinical team. IMPRESSION: Successful ultrasound-guided paracentesis yielding 2.9 liters of peritoneal fluid. Read by:  Brynda Greathouse PA-C Electronically Signed   By: Corrie Mckusick D.O.   On: 05/22/2016 12:49   Dg Abd Portable 1v  Result Date: 05/23/2016 CLINICAL DATA:  NG tube placement. EXAM: PORTABLE ABDOMEN - 1 VIEW COMPARISON:  05/23/2016 at 0858 hours FINDINGS: An enteric tube has been placed and overlies the gastric body with tip and side hole well beyond the GE junction. The right side of the abdomen was incompletely imaged, however small bowel dilatation is similar to the study from earlier today. IMPRESSION: 1. Enteric tube overlies the stomach. 2. Unchanged small bowel dilatation concerning for obstruction. Electronically Signed   By: Logan Bores M.D.   On: 05/23/2016 12:37    Labs:  CBC:  Recent Labs  05/20/16 1336 05/24/16 0400  WBC 8.8 8.0  HGB 12.2 12.4  HCT 36.6 37.5  PLT 464* 499*    COAGS:  Recent Labs  05/21/16 0351  INR 0.96    BMP:  Recent Labs  05/20/16 1336 05/21/16 0351 05/24/16 0400  NA 134* 135 138  K 4.4 4.6 4.3  CL 99* 101 100*  CO2 27 29 29   GLUCOSE 116* 113* 113*  BUN 13 11 21*  CALCIUM 8.8* 8.8* 8.7*  CREATININE 0.82 0.87 0.90  GFRNONAA >60 >60 >60  GFRAA >60 >  60 >60    LIVER FUNCTION TESTS:  Recent Labs  05/20/16 1336  BILITOT 0.6  AST 25  ALT 18  ALKPHOS 73  PROT 6.7  ALBUMIN 3.7    Assessment and Plan: Patient with history of advanced ovarian carcinoma, small bowel obstruction; question received for Port-A-Cath placement for chemotherapy.Risks and benefits discussed with the patient/daughter including, but not  limited to bleeding, infection, pneumothorax, or fibrin sheath development and need for additional procedures.All of the patient's questions were answered, patient is agreeable to proceed.Consent signed and in chart. Procedure scheduled for this afternoon.     Electronically Signed: D. Rowe Robert 05/24/2016, 1:02 PM   I spent a total of 20 minutes at the the patient's bedside AND on the patient's hospital floor or unit, greater than 50% of which was counseling/coordinating care for port a cath placement    Patient ID: Audrey Ratkovich, female   DOB: 1940-12-30, 75 y.o.   MRN: [id number]

## 2016-05-24 NOTE — Progress Notes (Signed)
300 cc from NG tube, dark,brown,coffee ground colored.wbb

## 2016-05-24 NOTE — Progress Notes (Signed)
  Echocardiogram 2D Echocardiogram has been performed.  Donata Clay 05/24/2016, 3:55 PM

## 2016-05-24 NOTE — Procedures (Signed)
Peritoneal carcinoma  S/p RT IJ POWER PORT  Tip svcra No comp Stable Ready for use Full report in PACS

## 2016-05-24 NOTE — Progress Notes (Addendum)
PROGRESS NOTE  Audrey Garza [id number] DOB: [birth year] DOA: 05/20/2016 PCP: No primary care provider on file.  Brief History:  75 year old female with a history of metastatic ovarian cancer presented with progressive abdominal bloating and pain for 3 weeks duration. She was feeling well until the first week of December, 2017 when she was sick with a virus from which she never recovered. She states that she began feeling distended in the abdomen and uncomfortable and was taken to the ED on 05/20/16. The patient reports being diagnosed with stage III ovarian cancer in January, 2017. She received primary debulking surgery followed by 8 adjuvant cycles of carboplatin and paclitaxel followed by an additional 4 cycles of what she describes as maintenance carboplatin, paclitaxel and avastin. Her last dose of therapy was in September 2017   CT chest/abd/pelvis 02-24-16 reportedly had no evidence of malignancy, with some bullous emphysema, tortuous sigmoid colon with diverticulosis. . Recommendation was for maintenance avastin x 1 year, however patient then came to [facility] in 02-2016. She has not established with any physicians since coming to [place]. CT AP 05-20-16 lias arge volume ascites, extensive peritoneal disease with omental thickening, trace bilateral pleural effusions, left colonic diverticulosis, bilateral inguinal hernias containing fat, post hysterectomy, no adnexal masses, no adenopathy. US paracentesis 05-22-16 for 2.9 liters of yellow fluid. 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 Unfortunately, after paracentesis she began having increasing abdominal pain. Abdominal x-ray revealed distended small bowel loops concerning for small bowel ejection. Gen. surgery was consulted. NG was inserted to suction.  Assessment/Plan: Metastatic ovarian cancer with extensive peritoneal disease with omental thickening with large ascites: -Family brought in prior medical  record, but in [place]. -She still seems to have good performance status -appreciate Drs. Marko Plume and Denman George -. 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  -ca 125 elevated, S/p paracentesis on 12/18 with 2.9 litter fluids removed.  -planning palliative chemo after acute medical issues stablized  Small Bowel Obstruction -05/23/2016 abdominal x-ray multiple dilated loops of bowel concerning for small bowel obstruction -Likely due to the extensive peritoneal carcinomatosis -NG tube inserted to suction -Patient now passing flatus and had bowel movement evening of 05/23/2016 -Diet advancement per general surgery -Appreciate general surgery consult -only 50 cc NG output in last 24 hours   Mild hyponatremia:  -Secondary to volume depletion -Improved with IV fluids  Mild thrombocytosis: -likely reactive.    Disposition Plan:   Home when cleared by surgery and oncology Family Communication:   Daughter updated at bedside  Consultants:  Med Onc--Livesay; Gyn Onc--Rossi; CCS  Code Status:  FULL   DVT Prophylaxis: [place] Lovenox   Procedures: As Listed in Progress Note Above  Antibiotics: None    Subjective: Patient had a bowel movement last evening and is passing flatus. Denies any nausea, vomiting, abdominal pain, diarrhea. No fevers, chills, chest pain, shortness breath, headache, neck pain.  Objective: Vitals:   05/23/16 0453 05/23/16 1400 05/23/16 2034 05/24/16 0431  BP: 131/74 134/76 128/66 137/72  Pulse: 85 92 89 87  Resp: 18 16 18 16   Temp: 99.3 F (37.4 C) 98.9 F (37.2 C) 98.4 F (36.9 C) 98 F (36.7 C)  TempSrc: Oral Oral Oral Oral  SpO2: 94% 90% 92% 93%  Weight:      Height:        Intake/Output Summary (Last 24  hours) at 05/24/16 1249 Last data filed at 05/24/16 1119  Gross per 24 hour  Intake             1260 ml    Output               50 ml  Net             1210 ml   Weight change:  Exam:   General:  Pt is alert, follows commands appropriately, not in acute distress  HEENT: No icterus, No thrush, No neck mass, [place]/AT  Cardiovascular: RRR, S1/S2, no rubs, no gallops  Respiratory: CTA bilaterally, no wheezing, no crackles, no rhonchi  Abdomen: Soft/+BS, non tender, non distended, no guarding  Extremities: No edema, No lymphangitis, No petechiae, No rashes, no synovitis   Data Reviewed: I have personally reviewed following labs and imaging studies Basic Metabolic Panel:  Recent Labs Lab 05/20/16 1336 05/21/16 0351 05/24/16 0400  NA 134* 135 138  K 4.4 4.6 4.3  CL 99* 101 100*  CO2 27 29 29   GLUCOSE 116* 113* 113*  BUN 13 11 21*  CREATININE 0.82 0.87 0.90  CALCIUM 8.8* 8.8* 8.7*  MG  --   --  2.3   Liver Function Tests:  Recent Labs Lab 05/20/16 1336  AST 25  ALT 18  ALKPHOS 73  BILITOT 0.6  PROT 6.7  ALBUMIN 3.7   No results for input(s): LIPASE, AMYLASE in the last 168 hours. No results for input(s): AMMONIA in the last 168 hours. Coagulation Profile:  Recent Labs Lab 05/21/16 0351  INR 0.96   CBC:  Recent Labs Lab 05/20/16 1336 05/24/16 0400  WBC 8.8 8.0  NEUTROABS 5.4 5.1  HGB 12.2 12.4  HCT 36.6 37.5  MCV 88.2 89.7  PLT 464* 499*   Cardiac Enzymes: No results for input(s): CKTOTAL, CKMB, CKMBINDEX, TROPONINI in the last 168 hours. BNP: Invalid input(s): POCBNP CBG: No results for input(s): GLUCAP in the last 168 hours. HbA1C: No results for input(s): HGBA1C in the last 72 hours. Urine analysis:    Component Value Date/Time   COLORURINE YELLOW 05/20/2016 1336   APPEARANCEUR HAZY (A) 05/20/2016 1336   LABSPEC 1.023 05/20/2016 1336   PHURINE 7.0 05/20/2016 1336   GLUCOSEU NEGATIVE 05/20/2016 1336   HGBUR NEGATIVE 05/20/2016 [place] 05/20/2016 1336   KETONESUR NEGATIVE 05/20/2016 1336   PROTEINUR NEGATIVE 05/20/2016  1336   NITRITE NEGATIVE 05/20/2016 1336   LEUKOCYTESUR LARGE (A) 05/20/2016 1336   Sepsis Labs: @LABRCNTIP (procalcitonin:4,lacticidven:4) ) Recent Results (from the past 240 hour(s))  Culture, body fluid-bottle     Status: None (Preliminary result)   Collection Time: 05/22/16 11:50 AM  Result Value Ref Range Status   Specimen Description FLUID  Final   Special Requests ASCTIES  Final   Culture   Final    NO GROWTH 2 DAYS Performed at [facility]    Report Status PENDING  Incomplete  Gram stain     Status: None   Collection Time: 05/22/16 11:50 AM  Result Value Ref Range Status   Specimen Description FLUID  Final   Special Requests ASCTIES  Final   Gram Stain   Final    FEW WBC PRESENT,BOTH PMN AND MONONUCLEAR NO ORGANISMS SEEN Performed at [facility]    Report Status 05/22/2016 FINAL  Final     Scheduled Meds: . bisacodyl  10 mg Rectal Daily  . enoxaparin (LOVENOX) injection  40 mg Subcutaneous Q24H  .  feeding supplement (ENSURE ENLIVE)  237 mL Oral BID BM  . pantoprazole (PROTONIX) IV  40 mg Intravenous Q24H  . senna-docusate  1 tablet Oral BID  . zolpidem  5 mg Oral Once   Continuous Infusions:  Procedures/Studies: Dg Abd 1 View  Result Date: 05/23/2016 CLINICAL DATA:  Assess nasogastric tube positioning. EXAM: ABDOMEN - 1 VIEW COMPARISON:  Chest x-ray of earlier today. FINDINGS: The nasogastric tube has been partially withdrawn such that the proximal port is at or just below the GE junction. The distal port lies in the proximal gastric fundus. There is a small amount of gas within the stomach. There is moderate gaseous distention of small-bowel loops in the mid abdomen. IMPRESSION: The nasogastric tubes proximal port is at or just below the GE junction. Advancement by 5 cm is recommended to assure that the proximal port does remain below the GE junction. Distended small bowel loops consistent with obstruction. Electronically Signed   By: Terrill Wauters  Martinique  M.D.   On: 05/23/2016 15:10   Dg Abd 1 View  Result Date: 05/23/2016 CLINICAL DATA:  Diffuse abdominal pain with nausea and vomiting EXAM: ABDOMEN - 1 VIEW COMPARISON:  CT abdomen and pelvis May 20, 2016 FINDINGS: Multiple loops of dilated bowel raise concern for a degree of bowel obstruction. No free air evident. There is moderate stool in the colon. IMPRESSION: Bowel gas pattern concerning for obstruction.  No free air evident. These results will be called to the ordering clinician or representative by the Radiologist Assistant, and communication documented in the PACS or zVision Dashboard. Electronically Signed   By: Lowella Grip III M.D.   On: 05/23/2016 09:28   Ct Abdomen Pelvis W Contrast  Result Date: 05/20/2016 CLINICAL DATA:  Abdominal distention, pain. EXAM: CT ABDOMEN AND PELVIS WITH CONTRAST TECHNIQUE: Multidetector CT imaging of the abdomen and pelvis was performed using the standard protocol following bolus administration of intravenous contrast. CONTRAST:  155mL ISOVUE-300 IOPAMIDOL (ISOVUE-300) INJECTION 61% COMPARISON:  None. FINDINGS: Lower chest: Bibasilar atelectasis. Heart is normal size. Moderate-sized hiatal hernia. Trace bilateral effusions. Hepatobiliary: No focal hepatic abnormality. Gallbladder unremarkable. Pancreas: No focal abnormality or ductal dilatation. Spleen: No focal abnormality.  Normal size. Adrenals/Urinary Tract: Small cyst in the midpole of the left kidney. No hydronephrosis. Adrenal glands and urinary bladder unremarkable. Stomach/Bowel: Descending colonic and sigmoid diverticulosis. No active diverticulitis. Stomach and small bowel unremarkable. Vascular/Lymphatic: Aortic and iliac calcifications. No aneurysm. No adenopathy. Reproductive: Prior hysterectomy.  No adnexal masses. Other: There is large volume ascites in the abdomen or pelvis. Extensive abnormal soft tissue throughout the omentum R with peritoneal malignancy. No free air. Bilateral inguinal  hernias containing fat. Musculoskeletal: No acute bony abnormality or focal bone lesion. IMPRESSION: Extensive peritoneal disease with omental thickening/ 18 and large volume ascites. 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 Trace bilateral pleural effusions. Moderate hiatal hernia. Bilateral inguinal hernias containing fat. Left colonic diverticulosis. Aortoiliac atherosclerosis. Electronically Signed   By: Rolm Baptise M.D.   On: 05/20/2016 15:10   US Paracentesis  Result Date: 05/22/2016 INDICATION: Patient with metastatic ovarian cancer and malignant ascites presents for diagnostic and therapeutic paracentesis. EXAM: ULTRASOUND GUIDED DIAGNOSTIC AND THERAPEUTIC PARACENTESIS MEDICATIONS: 10 mL 1% lidocaine COMPLICATIONS: None immediate. PROCEDURE: Informed written consent was obtained from the patient after a discussion of the risks, benefits and alternatives to treatment. A timeout was performed prior to the initiation of the procedure. Initial ultrasound scanning demonstrates a large amount of ascites within the right lower  abdominal quadrant. The right lower abdomen was prepped and draped in the usual sterile fashion. 1% lidocaine was used for local anesthesia. Following this, a 19 gauge, 7-cm, Yueh catheter was introduced. An ultrasound image was saved for documentation purposes. The paracentesis was performed. The catheter was removed and a dressing was applied. The patient tolerated the procedure well without immediate post procedural complication. FINDINGS: A total of approximately 2.9 liters of clear, yellow fluid was removed. Samples were sent to the laboratory as requested by the clinical team. IMPRESSION: Successful ultrasound-guided paracentesis yielding 2.9 liters of peritoneal fluid. Read by:  Brynda Greathouse PA-C Electronically Signed   By: Corrie Mckusick D.O.   On: 05/22/2016 12:49   Dg Abd Portable 1v  Result Date: 05/23/2016 CLINICAL DATA:  NG tube  placement. EXAM: PORTABLE ABDOMEN - 1 VIEW COMPARISON:  05/23/2016 at 0858 hours FINDINGS: An enteric tube has been placed and overlies the gastric body with tip and side hole well beyond the GE junction. The right side of the abdomen was incompletely imaged, however small bowel dilatation is similar to the study from earlier today. IMPRESSION: 1. Enteric tube overlies the stomach. 2. Unchanged small bowel dilatation concerning for obstruction. Electronically Signed   By: Logan Bores M.D.   On: 05/23/2016 12:37    Racine Erby, DO  [facility] Pager [phone]  If 7PM-7AM, please contact night-coverage [email or web address] Password [email or web address] 05/24/2016, 12:49 PM   LOS: 3 days

## 2016-05-24 NOTE — Progress Notes (Signed)
Medical Oncology May 24, 2016, 12:05 PM  Hospital day 5 Antibiotics: none Chemotherapy: last in [place] 02-02-16     EMR reviewed. Patient seen, with daughter here.   Subjective: She has been much more comfortable since NG to suction past 24 hours, with no abdominal discomfort or nausea now. NG is uncomfortable when she swallows, tho she is tolerating this. She had 3 small bowel movements last pm to this AM and is passing flatus. She denies SOB, cough, bladder symptoms. She would like to walk in hall. She has no other pain. IV is positional, tho access has not been difficult.  No bleeding.   Family tells me that she may have had one dose of carboplatin with maintenance chemotherapy summer 2017, tho this is not mentioned in outside records, which they believe are complete;  Previous platinum treatment was  carboplatin  x 3 from 05-2015  thru 07-07-15, then cisplatin  thru 10-2015. She was off all treatment for ~ a month before q 3 week taxol + some avastin thru 02-02-16.   Per daughter, application for Medicaid underway. Note this daughter is legally blind. Her husband is assisting with the Medicaid application.  Objective: Vital signs in last 24 hours: Blood pressure 137/72, pulse 87, temperature 98 F (36.7 C), temperature source Oral, resp. rate 16, height 5\' 3"  (1.6 m), weight 176 lb 12.9 oz (80.2 kg), SpO2 93 %.  Awake, alert, looks more comfortable today, in bed with NG to suction, clear brownish fluid in tubing now. Converses easily with daughter, very motivated to improve situation.  Respirations not labored RA. Oral mucosa moist and clear. PERRL, not icteric. Lungs without wheezes or rales. No JVD. Peripheral IV left forearm site ok, infusing NS at 75. Heart RRR no gallop. Abdomen soft, less distended, not tender, a few BS in lower quadrants. LE no edema, cords, tenderness. Moves easily in bed. No focal neuro deficits, no peripheral neuropathy.  Intake/Output from previous  day: 12/19 0701 - 12/20 0700 In: 1200 [I.V.:900; NG/GT:300] Out: 50 [Emesis/NG output:50] Intake/Output this shift: Total I/O In: 60 [NG/GT:60] Out: -     Lab Results:  Recent Labs  05/24/16 0400  WBC 8.0  HGB 12.4  HCT 37.5  PLT 499*   BMET  Recent Labs  05/24/16 0400  NA 138  K 4.3  CL 100*  CO2 29  GLUCOSE 113*  BUN 21*  CREATININE 0.90  CALCIUM 8.7*    Studies/Results: 1 view abdomen xray ordered now to follow up partial bowel obstruction / ileus since NG placed.   DISCUSSION  Confirmation of gyn malignancy by cytology from ascites discussed with patient and daughter.   Chemotherapy information reviewed with patient and daughter. It seems very likely that she is 6 months from last platinum agent (I do not believe that this information was clear at time of Dr Serita Grit consult on 05-23-16).  As platinum drugs can be very useful, it may be worth trying [place] gemzar as next intervention when GI situation has hopefully improved.  If no response, doxil or other second line treatment would be appropriate. Discussed PAC, as both gemzar and doxil need central line. She and daughter agree, will ask IR to assist. Will request echocardiogram to have that information available for doxil consideration.   Have asked nursing to clamp NG for short times to let her walk in hall.   Message to [facility] re availability for chemo in next week. As office infusion space is tight due to holiday, may need  to begin chemo in hospital if GI situation allows. By history, the symptoms from this recurrent gyn malignancy have been rapidly progressive in last 2 weeks, so will try to begin treatment as promptly as possible.  Will give chemotherapy information in Spanish written form if possible.  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 Progressive within 4 months of  taxol + avastin (last )02-02-16; appears  that last CDDP was 10-2015 and last carboplatin was 07-07-15. Has become rapidly symptomatic with malignant ascites and peritoneal carcinomatosis in past 2 weeks.  Post paracentesis 2.9 liters on 05-22-16. When bowel issues allow, expect to begin chemotherapy, either in hospital or at [facility].  Will need PAC for either gemzar or doxil, request to IR now. Echocardiogram for doxil assessment 2 partial bowel obstruction vs ileus: bowels have moved a small amount x3 beginning last pm, + flatus. Nausea resolved with NG. Coffee grounds in NG drainage, on IV protonix. 3.Chronic constipation since childhood per patient. 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 Not symptomatic now. 5.inguinal hernias containing only fat 6.aortic atherosclerosis by CT 7.social situation: recently moved from [place] to be with daughters in [place], family very supportive. Speaks Spanish, will need interpreter at office   All questions answered. Time spent 40 min including >50% counseling and coordination of care.  Please page [phone] or call [phone] if I can help between my rounds.  Evlyn Clines MD

## 2016-05-25 ENCOUNTER — Telehealth: Payer: Self-pay | Admitting: *Deleted

## 2016-05-25 ENCOUNTER — Inpatient Hospital Stay (HOSPITAL_COMMUNITY): Payer: Medicaid Other

## 2016-05-25 LAB — BASIC METABOLIC PANEL
ANION GAP: 9 (ref 5–15)
BUN: 19 mg/dL (ref 6–20)
CALCIUM: 8.5 mg/dL — AB (ref 8.9–10.3)
CO2: 27 mmol/L (ref 22–32)
Chloride: 105 mmol/L (ref 101–111)
Creatinine, Ser: 0.84 mg/dL (ref 0.44–1.00)
GFR calc Af Amer: >60 mL/min (ref >60)
GFR calc non Af Amer: >60 mL/min (ref >60)
GLUCOSE: 109 mg/dL — AB (ref 65–99)
POTASSIUM: 3.7 mmol/L (ref 3.5–5.1)
SODIUM: 141 mmol/L (ref 135–145)

## 2016-05-25 LAB — ECHOCARDIOGRAM LIMITED
Height: 63 in
Weight: 2828.94 oz

## 2016-05-25 MED ORDER — PHENOL 1.4 % MT LIQD
1.0000 | OROMUCOSAL | Status: DC | PRN
  Filled 2016-05-25 (×2): qty 177

## 2016-05-25 NOTE — Consult Note (Signed)
Consult Note: Gyn-Onc  Consult was requested by Dr. Florencia Reasons for the evaluation of Audrey Garza 75 y.o. female  CC: tumor ileus/malignant bowel obstruction   Assessment/Plan:  Audrey Garza  is a 75 y.o.  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  Clinically she is improving with passage of flatus and BM each day over the past 48 hours. However, she still has signs of an SBO radiographically.  She is sipping some clears around her NGT. No emesis. No pain.  With respect to her symptom management and malignant bowel obstruction:  1/ continue NGT and IVF and NPO until more consistent passage of flatus and low NG output (eg <300 per day). 2/ once NGT is removed after consistent passage of flatus +/- BM, would consider slow advancement of diet. I discussed with [name] that some patients with carcinomatosis never regain a normal oral intake. 3/ if she fails advancement of diet, she should be considered for palliative G tube. Based on her imaging a percutaneous effort may not be successful as it appears that the splenic flexure of colon overlies the stomach.  With respect to her malignancy - not a candidate for interval debulking surgery due to the platinum resistant nature of her disease - would only recommend contemplation of palliative procedures if indicated. - recommend palliative intent salvage chemotherapy with bowel function has returned and performance status has improved. - patient and family are aware that this malignancy is incurable and that average life expectancy is 43months. She remains full code at present despite this poor prognosis as she is keen to attempt 2nd line therapy. Will discuss case with Dr Marko Plume and consider timing of first dose of salvage palliative chemotherapy (likely Doxil).  HPI: Audrey Garza is a 75 year old woman for [place] who is seen in consultation  at the request of Dr Florencia Reasons for recurrent ovarian cancer and carcinomatosis.  The patient reports being diagnosed with stage III ovarian cancer in January, 2017. She received primary debulking surgery followed by 8 adjuvant cycles of carboplatin and paclitaxel followed by an additional 4 cycles of what she describes as maintenance carboplatin, paclitaxel and avastin. Her last dose of therapy was in September, 2017 and was discontinued when they "ran out of drugs to treat her with in [place]". She says that they performed CT imaging at that time which revealed complete response.   She left [place] in October, 2017 and permanently immigrated to the [place] where she lives with her daughter who is legally blind. She was feeling well until the first week of December, 2017 when she was sick with a virus from which she never recovered. She states that she began feeling distended in the abdomen and uncomfortable and was taken to the ED on 05/20/16.  In the ED at [facility], CT abdo/pelvis was obtained that showed large volume ascites, bulky carcinomatosis, dilated loops of small bowel. There was no apparent colonic obstruction with air and gas in the colon.   She was admitted and made NPO with IVF. She had emesis on the morning of 05/23/16 and an NGT was placed. On 05/22/16 she had paracentesis for 2 L of fluid. Cytology revealed adenocarcinoma [facility] pending).  Had BM's on 12/20 and 05/25/16. Is passing flatus.  Persistent SBO/distended loops of small bowel on abdominal x ray on 05/25/16. NGO output 150cc for 24 hours.  Current Meds:  Current Facility-Administered Medications:  .  acetaminophen (TYLENOL) tablet 650 mg, 650 mg, Oral, Q5H PRN, Florencia Reasons, MD .  alum & mag hydroxide-simeth (MAALOX/MYLANTA) 200-200-20 MG/5ML suspension 60 mL, 60 mL, Oral, Q4H PRN, Florencia Reasons, MD, 60 mL at 05/22/16 1838 .  enoxaparin (LOVENOX) injection 40 mg, 40 mg, Subcutaneous, Q24H, Florencia Reasons, MD, 40 mg at 05/24/16  1925 .  feeding supplement (ENSURE ENLIVE) (ENSURE ENLIVE) liquid 237 mL, 237 mL, Oral, BID BM, Florencia Reasons, MD, 237 mL at 05/22/16 1400 .  hydrALAZINE (APRESOLINE) injection 10 mg, 10 mg, Intravenous, Q6H PRN, Florencia Reasons, MD .  LORazepam (ATIVAN) injection 0.5-1 mg, 0.5-1 mg, Intravenous, Q6H PRN, Lennis Marion Downer, MD .  morphine 2 MG/ML injection 2 mg, 2 mg, Intravenous, Q4H PRN, Florencia Reasons, MD, 2 mg at 05/22/16 2230 .  ondansetron (ZOFRAN) 8 mg in sodium chloride 0.9 % 50 mL IVPB, 8 mg, Intravenous, Q8H PRN, Lennis P Livesay, MD .  oxyCODONE-acetaminophen (PERCOCET/ROXICET) 5-325 MG per tablet 1 tablet, 1 tablet, Oral, Q4H PRN, Florencia Reasons, MD, 1 tablet at 05/22/16 2032 .  pantoprazole (PROTONIX) injection 40 mg, 40 mg, Intravenous, Q24H, Lennis P Livesay, MD, 40 mg at 05/25/16 1001 .  phenol (CHLORASEPTIC) mouth spray 1 spray, 1 spray, Mouth/Throat, PRN, Lennis P Livesay, MD .  promethazine (PHENERGAN) injection 6.25 mg, 6.25 mg, Intravenous, Q4H PRN, Florencia Reasons, MD, 6.25 mg at 05/20/16 1826 .  senna-docusate (Senokot-S) tablet 1 tablet, 1 tablet, Oral, BID, Florencia Reasons, MD, 1 tablet at 05/25/16 1001 .  simethicone (MYLICON) chewable tablet 160 mg, 160 mg, Oral, Q6H PRN, Florencia Reasons, MD .  zolpidem (AMBIEN) tablet 5 mg, 5 mg, Oral, Once, Rise Patience, MD   Allergy: No Known Allergies  Social Hx:   Social History   Social History  . Marital status: Unknown    Spouse name: N/A  . Number of children: N/A  . Years of education: N/A   Occupational History  . Not on file.   Social History Main Topics  . Smoking status: Never Smoker  . Smokeless tobacco: Never Used  . Alcohol use No  . Drug use: Unknown  . Sexual activity: Not on file   Other Topics Concern  . Not on file   Social History Narrative  . No narrative on file    Past Surgical Hx:  Past Surgical History:  Procedure Laterality Date  . ABDOMINAL HYSTERECTOMY    . IR GENERIC HISTORICAL  05/24/2016   IR FLUORO GUIDE PORT INSERTION  RIGHT 05/24/2016 Greggory Keen, MD [facility]  . IR GENERIC HISTORICAL  05/24/2016   IR US GUIDE VASC ACCESS RIGHT 05/24/2016 Greggory Keen, MD [facility]    Past Medical Hx:  Past Medical History:  Diagnosis Date  . Cancer [facility])     Past Gynecological History:  Ovarian cancer, s/p TAH, BSO debulking January 2017.  No LMP recorded. Patient has had a hysterectomy.  Family Hx: History reviewed. No pertinent family history.  Review of Systems:  Constitutional  Feels generally unwell,    ENT Normal appearing ears and nares bilaterally Skin/Breast  No rash, sores, jaundice, itching, dryness Cardiovascular  No chest pain, shortness of breath, or edema  Pulmonary  No cough or wheeze.  Gastro Intestinal  + vomiting, distended. No BM Genito Urinary  No frequency, urgency, dysuria,  Musculo Skeletal  No myalgia, arthralgia, joint swelling or pain  Neurologic  No weakness, numbness, change in gait,  Psychology  No depression, anxiety, insomnia.   Vitals:  Blood pressure 136/71,  pulse 80, temperature 98.4 F (36.9 C), temperature source Oral, resp. rate 16, height 5\' 3"  (1.6 m), weight 176 lb 12.9 oz (80.2 kg), SpO2 95 %.  Physical Exam: WD in NAD Neck  Supple NROM, without any enlargements.  Lymph Node Survey No cervical supraclavicular or inguinal adenopathy Cardiovascular  Pulse normal rate, regularity and rhythm. S1 and S2 normal.  Lungs  Clear to auscultation bilateraly, without wheezes/crackles/rhonchi. Good air movement.  Skin  No rash/lesions/breakdown  Psychiatry  Alert and oriented to person, place, and time  Abdomen  Normoactive bowel sounds, abdomen soft, non-tender and nonobese without evidence of hernia. Less distended. Tympanitic to percuss with small fluid wave present. Back No CVA tenderness Genito Urinary  deferred Rectal  deferred  Extremities  No bilateral cyanosis, clubbing or edema.  CMP Latest Ref Rng & Units 05/25/2016 05/24/2016  05/21/2016  Glucose 65 - 99 mg/dL 109(H) 113(H) 113(H)  BUN 6 - 20 mg/dL 19 21(H) 11  Creatinine 0.44 - 1.00 mg/dL 0.84 0.90 0.87  Sodium 135 - 145 mmol/L 141 138 135  Potassium 3.5 - 5.1 mmol/L 3.7 4.3 4.6  Chloride 101 - 111 mmol/L 105 100(L) 101  CO2 22 - 32 mmol/L 27 29 29   Calcium 8.9 - 10.3 mg/dL 8.5(L) 8.7(L) 8.8(L)  Total Protein 6.5 - 8.1 g/dL - - -  Total Bilirubin 0.3 - 1.2 mg/dL - - -  Alkaline Phos 38 - 126 U/L - - -  AST 15 - 41 U/L - - -  ALT 14 - 54 U/L - - -   CBC    Component Value Date/Time   WBC 8.0 05/24/2016 0400   RBC 4.18 05/24/2016 0400   HGB 12.4 05/24/2016 0400   HCT 37.5 05/24/2016 0400   PLT 499 (H) 05/24/2016 0400   MCV 89.7 05/24/2016 0400   MCH 29.7 05/24/2016 0400   MCHC 33.1 05/24/2016 0400   RDW 12.7 05/24/2016 0400   LYMPHSABS 1.8 05/24/2016 0400   MONOABS 1.0 05/24/2016 0400   EOSABS 0.0 05/24/2016 0400   BASOSABS 0.0 05/24/2016 0400    CA 125 250 (05/21/16)  30 minutes of face to face counseling was spent with the patient including review of imaging and results, discussion of prognosis and treatment options.   Donaciano Eva, MD  ([phone]) 05/25/2016, 5:50 PM

## 2016-05-25 NOTE — Progress Notes (Signed)
Medical Oncology May 25, 2016, 8:42 AM  Hospital day 6 Antibiotics: none Chemotherapy: last in [place] 02-02-16. Last platinum either May 2017 or early summer 2017. Last avastin at least 01-3016.  Patient is seen with granddaughter here. Spoke with unit RN. Messages sent to general surgery and gyn oncology.  PAC placed 05-24-16 NG still to suction  Subjective: Awake and alert, denies any abdominal pain or nausea. Back of throat uncomfortable with NG. Sipped some apple juice yesterday to soothe throat, with NG to suction then. Has had some BM and has passed gas again. Voiding well. Walked in hall in AM yesterday with NG clamped, did not walk after PAC placement. PAC just a little sore, present IV site in antecubital mild erythema and ok to use PAC now. No SOB or other respiratory symptoms. No other pain.   Oncologic History Patient had hysterectomy without oophorectomy in her 8s for benign indication, and been in her usual good health until July 2016, when she developed abdominal discomfort and bloating. In late 03-2015 she was found to have large pelvic mass and perihepatic ascites. CA 125 then was 359, with CEA of 3, CA 19-9 5.8 and alpha fetoprotein 2.3. She had exploratory laparotomy 04-19-15, which was not done by gyn oncologist, with bilateral ovaried an tumors, 10 cm with capsule ruptured on right , smaller on left, peritoneal carcinomatosis. Washings were done, however accompanying information does not describe extent of debulking. She had some bowel obstruction post operatively, with NG used ~ POD 3. Pathology available 05-05-15 showed cystic and solid left ovarian mass, 13 cm right ovarian mass, moderately differentiated tumor "tumor de celulas de la granulosa". 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 CT AP 122016 had ascites, carcinomatosis and multiple peritoneal implants. CA 125 on 05-24-15 baseline for  chemo was 141. She received 3 cycles of taxol + carboplatin from 05-26-15 thru 07-07-15 (not clear if avastin given those cycles), after which carboplatin was not available in [place]. She received taxol 175 mg/m2, CDDP and avastin for 3 additional cycles thru 10-07-15. Second look laparotomy was discussed, patient declined, and instead had 4 cycles avastin + taxol from 12-01-15 thru 02-02-16 (family is confirming, but we believe no CDDP after 10-07-15 and no carboplatin after 07-07-15).  CT CAP 02-24-16 reportedly had no evidence of malignancy, with some bullous emphysema, tortuous sigmoid colon with diverticulosis. . Recommendation was for maintenance avastin x 1 year, however patient then came to [facility] in 02-2016. She has not established with any physicians since coming to [place]. CT AP 05-20-16 lias arge volume ascites, extensive peritoneal disease with omental thickening, trace bilateral pleural effusions, left colonic diverticulosis, bilateral inguinal hernias containing fat, post hysterectomy, no adnexal masses, no adenopathy. US paracentesis 05-22-16 for 2.9 liters of yellow fluid. Cytology adenocarcinoma with serous features, immunohistochemical stains confirm gyn.      Objective: Vital signs in last 24 hours: Blood pressure (!) 141/67, pulse 79, temperature 98.4 F (36.9 C), temperature source Oral, resp. rate 18, height 5\' 3"  (1.6 m), weight 176 lb 12.9 oz (80.2 kg), SpO2 95 %. Awake, alert, looks comfortable overal in bed with NG to suction, tan fluid in tubing. Oral mucosa moist. Lungs without wheezes or rales. Heart RRR. PAC dressed, little surrounding ecchymosis, not accessed. Peripheral IV left antecubital slight erythema at insertion, no swelling. Abdomen soft, not more distended, not tender. Occasional BS lower quadrants. LE no edema, cords, tenderness. Moves all extremities easily.   Intake/Output  from previous day: 12/20 0701 - 12/21 0700 In: 260 [P.O.:100; NG/GT:60; IV Piggyback:100] Out: 150  [Emesis/NG output:150] Intake/Output this shift: No intake/output data recorded.  :  Lab Results:  Recent Labs  05/24/16 0400  WBC 8.0  HGB 12.4  HCT 37.5  PLT 499*   BMET  Recent Labs  05/24/16 0400 05/25/16 0407  NA 138 141  K 4.3 3.7  CL 100* 105  CO2 29 27  GLUCOSE 113* 109*  BUN 21* 19  CREATININE 0.90 0.84  CALCIUM 8.7* 8.5*    Studies/Results: Dg Abd 1 View  Result Date: 05/23/2016 CLINICAL DATA:  Assess nasogastric tube positioning. EXAM: ABDOMEN - 1 VIEW COMPARISON:  Chest x-ray of earlier today. FINDINGS: The nasogastric tube has been partially withdrawn such that the proximal port is at or just below the GE junction. The distal port lies in the proximal gastric fundus. There is a small amount of gas within the stomach. There is moderate gaseous distention of small-bowel loops in the mid abdomen. IMPRESSION: The nasogastric tubes proximal port is at or just below the GE junction. Advancement by 5 cm is recommended to assure that the proximal port does remain below the GE junction. Distended small bowel loops consistent with obstruction. Electronically Signed   By: David  Martinique M.D.   On: 05/23/2016 15:10   Dg Abd 1 View  Result Date: 05/23/2016 CLINICAL DATA:  Diffuse abdominal pain with nausea and vomiting EXAM: ABDOMEN - 1 VIEW COMPARISON:  CT abdomen and pelvis May 20, 2016 FINDINGS: Multiple loops of dilated bowel raise concern for a degree of bowel obstruction. No free air evident. There is moderate stool in the colon. IMPRESSION: Bowel gas pattern concerning for obstruction.  No free air evident. These results will be called to the ordering clinician or representative by the Radiologist Assistant, and communication documented in the PACS or zVision Dashboard. Electronically Signed   By: Lowella Grip III M.D.   On: 05/23/2016 09:28   Ir US Guide Vasc Access Right  Result Date: 05/24/2016 CLINICAL DATA:  PERITONEAL CARCINOMA WITH ASCITES, ACCESS  FOR CHEMO EXAM: RIGHT INTERNAL JUGULAR SINGLE LUMEN POWER PORT CATHETER INSERTION Date:  12/20/201712/20/2017 2:46 pm Radiologist:  M. Daryll Brod, MD Guidance:  Ultrasound and fluoroscopic MEDICATIONS: 2 g Ancef; The antibiotic was administered within an appropriate time interval prior to skin puncture. ANESTHESIA/SEDATION: Versed 2.0 mg IV; Fentanyl 100 mcg IV; Moderate Sedation Time:  28 minutes The patient was continuously monitored during the procedure by the interventional radiology nurse under my direct supervision. FLUOROSCOPY TIME:  , 30 seconds (4 mGy) COMPLICATIONS: None immediate. CONTRAST:  None. PROCEDURE: Informed consent was obtained from the patient following explanation of the procedure, risks, benefits and alternatives. The patient understands, agrees and consents for the procedure. All questions were addressed. A time out was performed. Maximal barrier sterile technique utilized including caps, mask, sterile gowns, sterile gloves, large sterile drape, hand hygiene, and 2% chlorhexidine scrub. Under sterile conditions and local anesthesia, right internal jugular micropuncture venous access was performed. Access was performed with ultrasound. Images were obtained for documentation. A guide wire was inserted followed by a transitional dilator. This allowed insertion of a guide wire and catheter into the IVC. Measurements were obtained from the SVC / RA junction back to the right IJ venotomy site. In the right infraclavicular chest, a subcutaneous pocket was created over the second anterior rib. This was done under sterile conditions and local anesthesia. 1% lidocaine with epinephrine was utilized for this.  A 2.5 cm incision was made in the skin. Blunt dissection was performed to create a subcutaneous pocket over the right pectoralis major muscle. The pocket was flushed with saline vigorously. There was adequate hemostasis. The port catheter was assembled and checked for leakage. The port catheter  was secured in the pocket with two retention sutures. The tubing was tunneled subcutaneously to the right venotomy site and inserted into the SVC/RA junction through a valved peel-away sheath. Position was confirmed with fluoroscopy. Images were obtained for documentation. The patient tolerated the procedure well. No immediate complications. Incisions were closed in a two layer fashion with 4 - 0 Vicryl suture. Dermabond was applied to the skin. The port catheter was accessed, blood was aspirated followed by saline and heparin flushes. Needle was removed. A dry sterile dressing was applied. IMPRESSION: Ultrasound and fluoroscopically guided right internal jugular single lumen power port catheter insertion. Tip in the SVC/RA junction. Catheter ready for use. Electronically Signed   By: Jerilynn Mages.  Shick M.D.   On: 05/24/2016 14:59   Dg Abd Portable 1v  Result Date: 05/25/2016 CLINICAL DATA:  Initial evaluation for small bowel obstruction. EXAM: PORTABLE ABDOMEN - 1 VIEW COMPARISON:  Prior radiograph from 05/24/2016. FINDINGS: Enteric tube remains in place with side hole at or just beyond the level of the GE junction. Multiple dilated gas-filled loops of small bowel again seen within the abdomen. These measure up to 6.4 cm in diameter. Visualized loops of colon within normal limits. No definite free air on this limited supine view the abdomen. No soft tissue mass or abnormal calcification. Osseous structures unchanged. IMPRESSION: 1. Persistent gas filled distended loops of small bowel within the mid and lower abdomen, compatible with small bowel obstruction. Overall, the appearance is interval changed relative to 05/24/2016. 2. NG tube in place. Electronically Signed   By: Jeannine Boga M.D.   On: 05/25/2016 06:07   Dg Abd Portable 1v  Result Date: 05/24/2016 CLINICAL DATA:  NG decompression of partial bowel obstruction/ileus. EXAM: PORTABLE ABDOMEN - 1 VIEW COMPARISON:  One-view abdomen 05/23/2016.  FINDINGS: The side port of the NG tube appears to be at or just beyond the GE junction. Stomach is decompressed. Several loops of small small bowel in the central abdomen demonstrate increased distention compared to the based recent films. Some gas gas is again noted in the hepatic flexure of the colon. IMPRESSION: Increased distention of small bowel compatible with small bowel obstruction despite NG tube are placement. Electronically Signed   By: San Morelle M.D.   On: 05/24/2016 15:35   Dg Abd Portable 1v  Result Date: 05/23/2016 CLINICAL DATA:  NG tube placement. EXAM: PORTABLE ABDOMEN - 1 VIEW COMPARISON:  05/23/2016 at 0858 hours FINDINGS: An enteric tube has been placed and overlies the gastric body with tip and side hole well beyond the GE junction. The right side of the abdomen was incompletely imaged, however small bowel dilatation is similar to the study from earlier today. IMPRESSION: 1. Enteric tube overlies the stomach. 2. Unchanged small bowel dilatation concerning for obstruction. Electronically Signed   By: Logan Bores M.D.   On: 05/23/2016 12:37   Ir Fluoro Guide Port Insertion Right  Result Date: 05/24/2016 CLINICAL DATA:  PERITONEAL CARCINOMA WITH ASCITES, ACCESS FOR CHEMO EXAM: RIGHT INTERNAL JUGULAR SINGLE LUMEN POWER PORT CATHETER INSERTION Date:  12/20/201712/20/2017 2:46 pm Radiologist:  M. Daryll Brod, MD Guidance:  Ultrasound and fluoroscopic MEDICATIONS: 2 g Ancef; The antibiotic was administered within an appropriate time interval prior  to skin puncture. ANESTHESIA/SEDATION: Versed 2.0 mg IV; Fentanyl 100 mcg IV; Moderate Sedation Time:  28 minutes The patient was continuously monitored during the procedure by the interventional radiology nurse under my direct supervision. FLUOROSCOPY TIME:  , 30 seconds (4 mGy) COMPLICATIONS: None immediate. CONTRAST:  None. PROCEDURE: Informed consent was obtained from the patient following explanation of the procedure, risks,  benefits and alternatives. The patient understands, agrees and consents for the procedure. All questions were addressed. A time out was performed. Maximal barrier sterile technique utilized including caps, mask, sterile gowns, sterile gloves, large sterile drape, hand hygiene, and 2% chlorhexidine scrub. Under sterile conditions and local anesthesia, right internal jugular micropuncture venous access was performed. Access was performed with ultrasound. Images were obtained for documentation. A guide wire was inserted followed by a transitional dilator. This allowed insertion of a guide wire and catheter into the IVC. Measurements were obtained from the SVC / RA junction back to the right IJ venotomy site. In the right infraclavicular chest, a subcutaneous pocket was created over the second anterior rib. This was done under sterile conditions and local anesthesia. 1% lidocaine with epinephrine was utilized for this. A 2.5 cm incision was made in the skin. Blunt dissection was performed to create a subcutaneous pocket over the right pectoralis major muscle. The pocket was flushed with saline vigorously. There was adequate hemostasis. The port catheter was assembled and checked for leakage. The port catheter was secured in the pocket with two retention sutures. The tubing was tunneled subcutaneously to the right venotomy site and inserted into the SVC/RA junction through a valved peel-away sheath. Position was confirmed with fluoroscopy. Images were obtained for documentation. The patient tolerated the procedure well. No immediate complications. Incisions were closed in a two layer fashion with 4 - 0 Vicryl suture. Dermabond was applied to the skin. The port catheter was accessed, blood was aspirated followed by saline and heparin flushes. Needle was removed. A dry sterile dressing was applied. IMPRESSION: Ultrasound and fluoroscopically guided right internal jugular single lumen power port catheter insertion. Tip in  the SVC/RA junction. Catheter ready for use. Electronically Signed   By: Jerilynn Mages.  Shick M.D.   On: 05/24/2016 14:59    DISCUSSION Patient and family member understand that xray still shows dilated small bowel up to 6.4 cm diameter. Needs to keep NG to suction, ice chips or sips clears only while NG suction.  Encouraged her to walk with NG clamped briefly to allow this. Continue IVF.  I will let general surgery and gyn onc know situation Need to hold chemo until acute bowel situation resolved, but by history may be useful to try carboplatin (by history now is not clearly platinum resistant). She seems very healthy otherwise and very motivated to try intervention, chemo or other. Considering carboplatin + gemzar. (First available infusion spot outpatient at [facility] 06-02-16)  Echo done and pending in case doxil needed.   Assessment/Plan:  1.advanced ovarian carcinoma: Bilateral ovarian by history, adeno likely high grade serous by cytology now, IIIC. Progressive within 4 months of  taxol + avastin (last )02-02-16; appears that last CDDP was 10-2015 and last carboplatin was 07-07-15. Has become rapidly symptomatic with malignant ascites and peritoneal carcinomatosis in past 2 weeks.  Post paracentesis 2.9 liters on 05-22-16. When bowel issues allow, expect to begin chemotherapy, either in hospital or at [facility].  Even with this history, she may get significant benefit from additional systemic treatment, and appears in very good condition otherwise.   Echocardiogram  for doxil assessment 2. Small bowel obstruction: at least partial, with marked dilatation by xray, some flatus. Continue NG suction, general surgery to follow up.  3.Chronic constipation since childhood per patient. 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 Not symptomatic now. 5.inguinal hernias containing only fat 6.aortic atherosclerosis by CT 7.social  situation: recently moved from [place] to be with daughters in [place], family very supportive. Speaks Spanish, will need interpreter at office  8. Nutrition: no po intake now. 9.PAC placed by IR  Will follow. Please page or call if needed also  Pager [phone], cell [place]  Evlyn Clines MD

## 2016-05-25 NOTE — Telephone Encounter (Signed)
Per staff message I have scheduled appt, MD notoified

## 2016-05-25 NOTE — Progress Notes (Signed)
PROGRESS NOTE  Audrey Garza [id number] DOB: Jul 28, 1940 DOA: 05/20/2016 PCP: No primary care provider on file.  Brief History:  75 year old female with a history of metastatic ovarian cancer presented with progressive abdominal bloating and pain for 3 weeks duration. She was feeling well until the first week of December, 2017 when she was sick with a virus from which she never recovered. She states that she began feeling distended in the abdomen and uncomfortable and was taken to the ED on 05/20/16. The patient reports being diagnosed with stage III ovarian cancer in January, 2017. She received primary debulking surgery followed by 8 adjuvant cycles of carboplatin and paclitaxel followed by an additional 4 cycles of what she describes as maintenance carboplatin, paclitaxel and avastin. Her last dose of therapy was in September 2017   CT chest/abd/pelvis 02-24-16 reportedly had no evidence of malignancy, with some bullous emphysema, tortuous sigmoid colon with diverticulosis. . Recommendation was for maintenance avastin x 1 year, however patient then came to [facility] in 02-2016. She has not established with any physicians since coming to [place]. CT AP 05-20-16 lias arge volume ascites, extensive peritoneal disease with omental thickening, trace bilateral pleural effusions, left colonic diverticulosis, bilateral inguinal hernias containing fat, post hysterectomy, no adnexal masses, no adenopathy. US paracentesis 05-22-16 for 2.9 liters of yellow fluid. 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 Unfortunately, after paracentesis she began having increasing abdominal pain. Abdominal x-ray revealed distended small bowel loops concerning for small bowel ejection. Gen. surgery was consulted. NG was inserted to suction.  Assessment/Plan: Metastatic ovarian cancer with extensive peritoneal disease with omental thickening with large ascites: -Family brought in prior medical  record, but in [place]. -She still seems to have good performance status -appreciate Drs. Marko Plume and Denman George -. 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 Denman George--- not a candidate for interval debulking surgery due to the platinum resistant nature of her disease--if she fails advancement of diet, she should be considered for palliative G tube  Small Bowel Obstruction -05/23/2016 abdominal x-ray multiple dilated loops of bowel concerning for small bowel obstruction -Likely due to the extensive peritoneal carcinomatosis -12/19--NG tube inserted to suction -Patient now passing flatus and had bowel movement 12/19 and 12/20  -12/21 Abd xray--persistent distended loops of sm bowel c/w SBO -Diet advancement per general surgery-->remain NPO for now--feels she  -Appreciate general surgery consult-->unlikely to fix with an operation -150cc NG output past 24 hours  Mild hyponatremia:  -Secondary to volume depletion -Improved with IV fluids  Mild thrombocytosis: -likely reactive.    Disposition Plan:   Home when cleared by surgery and oncology Family Communication:   Daughter updated at bedside 12/21  Consultants:  Med Onc--Livesay; Gyn Onc--Rossi; CCS  Code Status:  FULL   DVT Prophylaxis: [place] Lovenox   Procedures: As Listed in Progress Note Above  Antibiotics: None    Subjective: Patient denies fevers, chills, headache, chest pain, dyspnea, nausea, vomiting, diarrhea,  dysuria, hematuria, hematochezia, and melena. She feels hungry. She has intermittent abdominal pain.  Objective: Vitals:   05/24/16 1457  05/24/16 2056 05/25/16 0450 05/25/16 1430  BP: (!) 141/74 (!) 153/74 (!) 141/67 136/71  Pulse:  90 80 79 80  Resp: 18 18 18 16   Temp:  98.4 F (36.9 C) 98.4 F (36.9 C) 98.4 F (36.9 C)  TempSrc:  Oral Oral Oral  SpO2: 90% 97% 95% 95%  Weight:      Height:        Intake/Output Summary (Last 24 hours) at 05/25/16 1801 Last data filed at 05/25/16 1454  Gross per 24 hour  Intake              940 ml  Output              150 ml  Net              790 ml   Weight change:  Exam:   General:  Pt is alert, follows commands appropriately, not in acute distress  HEENT: No icterus, No thrush, No neck mass, [place]/AT  Cardiovascular: RRR, S1/S2, no rubs, no gallops  Respiratory: CTA bilaterally, no wheezing, no crackles, no rhonchi  Abdomen: Soft/+BS, Mild diffusely tender, non distended, no guarding  Extremities: No edema, No lymphangitis, No petechiae, No rashes, no synovitis   Data Reviewed: I have personally reviewed following labs and imaging studies Basic Metabolic Panel:  Recent Labs Lab 05/20/16 1336 05/21/16 0351 05/24/16 0400 05/25/16 0407  NA 134* 135 138 141  K 4.4 4.6 4.3 3.7  CL 99* 101 100* 105  CO2 27 29 29 27   GLUCOSE 116* 113* 113* 109*  BUN 13 11 21* 19  CREATININE 0.82 0.87 0.90 0.84  CALCIUM 8.8* 8.8* 8.7* 8.5*  MG  --   --  2.3  --    Liver Function Tests:  Recent Labs Lab 05/20/16 1336  AST 25  ALT 18  ALKPHOS 73  BILITOT 0.6  PROT 6.7  ALBUMIN 3.7   No results for input(s): LIPASE, AMYLASE in the last 168 hours. No results for input(s): AMMONIA in the last 168 hours. Coagulation Profile:  Recent Labs Lab 05/21/16 0351  INR 0.96   CBC:  Recent Labs Lab 05/20/16 1336 05/24/16 0400  WBC 8.8 8.0  NEUTROABS 5.4 5.1  HGB 12.2 12.4  HCT 36.6 37.5  MCV 88.2 89.7  PLT 464* 499*   Cardiac Enzymes: No results for input(s): CKTOTAL, CKMB, CKMBINDEX, TROPONINI in the last 168 hours. BNP: Invalid input(s): POCBNP CBG: No results  for input(s): GLUCAP in the last 168 hours. HbA1C: No results for input(s): HGBA1C in the last 72 hours. Urine analysis:    Component Value Date/Time   COLORURINE YELLOW 05/20/2016 1336   APPEARANCEUR HAZY (A) 05/20/2016 1336   LABSPEC 1.023 05/20/2016 1336   PHURINE 7.0 05/20/2016 1336   GLUCOSEU NEGATIVE 05/20/2016 1336   HGBUR NEGATIVE 05/20/2016 [place] 05/20/2016 1336   KETONESUR NEGATIVE 05/20/2016 1336   PROTEINUR NEGATIVE 05/20/2016 1336   NITRITE NEGATIVE 05/20/2016 1336   LEUKOCYTESUR LARGE (A) 05/20/2016 1336   Sepsis Labs: @LABRCNTIP (procalcitonin:4,lacticidven:4) ) Recent Results (from the past 240 hour(s))  Culture, body fluid-bottle     Status: None (Preliminary result)   Collection Time: 05/22/16 11:50 AM  Result Value Ref Range Status   Specimen Description FLUID  Final   Special Requests ASCTIES  Final   Culture   Final    NO GROWTH 3 DAYS Performed at [facility]    Report Status PENDING  Incomplete  Gram stain     Status: None   Collection Time: 05/22/16 11:50 AM  Result Value Ref Range Status  Specimen Description FLUID  Final   Special Requests ASCTIES  Final   Gram Stain   Final    FEW WBC PRESENT,BOTH PMN AND MONONUCLEAR NO ORGANISMS SEEN Performed at [facility]    Report Status 05/22/2016 FINAL  Final     Scheduled Meds: . enoxaparin (LOVENOX) injection  40 mg Subcutaneous Q24H  . feeding supplement (ENSURE ENLIVE)  237 mL Oral BID BM  . pantoprazole (PROTONIX) IV  40 mg Intravenous Q24H  . senna-docusate  1 tablet Oral BID  . zolpidem  5 mg Oral Once   Continuous Infusions:  Procedures/Studies: Dg Abd 1 View  Result Date: 05/23/2016 CLINICAL DATA:  Assess nasogastric tube positioning. EXAM: ABDOMEN - 1 VIEW COMPARISON:  Chest x-ray of earlier today. FINDINGS: The nasogastric tube has been partially withdrawn such that the proximal port is at or just below the GE junction. The distal port lies in  the proximal gastric fundus. There is a small amount of gas within the stomach. There is moderate gaseous distention of small-bowel loops in the mid abdomen. IMPRESSION: The nasogastric tubes proximal port is at or just below the GE junction. Advancement by 5 cm is recommended to assure that the proximal port does remain below the GE junction. Distended small bowel loops consistent with obstruction. Electronically Signed   By: Myrakle Wingler  [place] M.D.   On: 05/23/2016 15:10   Dg Abd 1 View  Result Date: 05/23/2016 CLINICAL DATA:  Diffuse abdominal pain with nausea and vomiting EXAM: ABDOMEN - 1 VIEW COMPARISON:  CT abdomen and pelvis May 20, 2016 FINDINGS: Multiple loops of dilated bowel raise concern for a degree of bowel obstruction. No free air evident. There is moderate stool in the colon. IMPRESSION: Bowel gas pattern concerning for obstruction.  No free air evident. These results will be called to the ordering clinician or representative by the Radiologist Assistant, and communication documented in the PACS or zVision Dashboard. Electronically Signed   By: Lowella Grip III M.D.   On: 05/23/2016 09:28   Ct Abdomen Pelvis W Contrast  Result Date: 05/20/2016 CLINICAL DATA:  Abdominal distention, pain. EXAM: CT ABDOMEN AND PELVIS WITH CONTRAST TECHNIQUE: Multidetector CT imaging of the abdomen and pelvis was performed using the standard protocol following bolus administration of intravenous contrast. CONTRAST:  186mL ISOVUE-300 IOPAMIDOL (ISOVUE-300) INJECTION 61% COMPARISON:  None. FINDINGS: Lower chest: Bibasilar atelectasis. Heart is normal size. Moderate-sized hiatal hernia. Trace bilateral effusions. Hepatobiliary: No focal hepatic abnormality. Gallbladder unremarkable. Pancreas: No focal abnormality or ductal dilatation. Spleen: No focal abnormality.  Normal size. Adrenals/Urinary Tract: Small cyst in the midpole of the left kidney. No hydronephrosis. Adrenal glands and urinary bladder  unremarkable. Stomach/Bowel: Descending colonic and sigmoid diverticulosis. No active diverticulitis. Stomach and small bowel unremarkable. Vascular/Lymphatic: Aortic and iliac calcifications. No aneurysm. No adenopathy. Reproductive: Prior hysterectomy.  No adnexal masses. Other: There is large volume ascites in the abdomen or pelvis. Extensive abnormal soft tissue throughout the omentum R with peritoneal malignancy. No free air. Bilateral inguinal hernias containing fat. Musculoskeletal: No acute bony abnormality or focal bone lesion. IMPRESSION: Extensive peritoneal disease with omental thickening/ 18 and large volume ascites. 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 Trace bilateral pleural effusions. Moderate hiatal hernia. Bilateral inguinal hernias containing fat. Left colonic diverticulosis. Aortoiliac atherosclerosis. Electronically Signed   By: Rolm Baptise M.D.   On: 05/20/2016 15:10   US Paracentesis  Result Date: 05/22/2016 INDICATION: Patient with metastatic ovarian cancer and malignant ascites presents for diagnostic  and therapeutic paracentesis. EXAM: ULTRASOUND GUIDED DIAGNOSTIC AND THERAPEUTIC PARACENTESIS MEDICATIONS: 10 mL 1% lidocaine COMPLICATIONS: None immediate. PROCEDURE: Informed written consent was obtained from the patient after a discussion of the risks, benefits and alternatives to treatment. A timeout was performed prior to the initiation of the procedure. Initial ultrasound scanning demonstrates a large amount of ascites within the right lower abdominal quadrant. The right lower abdomen was prepped and draped in the usual sterile fashion. 1% lidocaine was used for local anesthesia. Following this, a 19 gauge, 7-cm, Yueh catheter was introduced. An ultrasound image was saved for documentation purposes. The paracentesis was performed. The catheter was removed and a dressing was applied. The patient tolerated the procedure well without immediate post  procedural complication. FINDINGS: A total of approximately 2.9 liters of clear, yellow fluid was removed. Samples were sent to the laboratory as requested by the clinical team. IMPRESSION: Successful ultrasound-guided paracentesis yielding 2.9 liters of peritoneal fluid. Read by:  Brynda Greathouse PA-C Electronically Signed   By: Corrie Mckusick D.O.   On: 05/22/2016 12:49   Ir US Guide Vasc Access Right  Result Date: 05/24/2016 CLINICAL DATA:  PERITONEAL CARCINOMA WITH ASCITES, ACCESS FOR CHEMO EXAM: RIGHT INTERNAL JUGULAR SINGLE LUMEN POWER PORT CATHETER INSERTION Date:  12/20/201712/20/2017 2:46 pm Radiologist:  M. Daryll Brod, MD Guidance:  Ultrasound and fluoroscopic MEDICATIONS: 2 g Ancef; The antibiotic was administered within an appropriate time interval prior to skin puncture. ANESTHESIA/SEDATION: Versed 2.0 mg IV; Fentanyl 100 mcg IV; Moderate Sedation Time:  28 minutes The patient was continuously monitored during the procedure by the interventional radiology nurse under my direct supervision. FLUOROSCOPY TIME:  , 30 seconds (4 mGy) COMPLICATIONS: None immediate. CONTRAST:  None. PROCEDURE: Informed consent was obtained from the patient following explanation of the procedure, risks, benefits and alternatives. The patient understands, agrees and consents for the procedure. All questions were addressed. A time out was performed. Maximal barrier sterile technique utilized including caps, mask, sterile gowns, sterile gloves, large sterile drape, hand hygiene, and 2% chlorhexidine scrub. Under sterile conditions and local anesthesia, right internal jugular micropuncture venous access was performed. Access was performed with ultrasound. Images were obtained for documentation. A guide wire was inserted followed by a transitional dilator. This allowed insertion of a guide wire and catheter into the IVC. Measurements were obtained from the SVC / RA junction back to the right IJ venotomy site. In the right  infraclavicular chest, a subcutaneous pocket was created over the second anterior rib. This was done under sterile conditions and local anesthesia. 1% lidocaine with epinephrine was utilized for this. A 2.5 cm incision was made in the skin. Blunt dissection was performed to create a subcutaneous pocket over the right pectoralis major muscle. The pocket was flushed with saline vigorously. There was adequate hemostasis. The port catheter was assembled and checked for leakage. The port catheter was secured in the pocket with two retention sutures. The tubing was tunneled subcutaneously to the right venotomy site and inserted into the SVC/RA junction through a valved peel-away sheath. Position was confirmed with fluoroscopy. Images were obtained for documentation. The patient tolerated the procedure well. No immediate complications. Incisions were closed in a two layer fashion with 4 - 0 Vicryl suture. Dermabond was applied to the skin. The port catheter was accessed, blood was aspirated followed by saline and heparin flushes. Needle was removed. A dry sterile dressing was applied. IMPRESSION: Ultrasound and fluoroscopically guided right internal jugular single lumen power port catheter insertion. Tip in the  SVC/RA junction. Catheter ready for use. Electronically Signed   By: Jerilynn Mages.  Shick M.D.   On: 05/24/2016 14:59   Dg Abd Portable 1v  Result Date: 05/25/2016 CLINICAL DATA:  Initial evaluation for small bowel obstruction. EXAM: PORTABLE ABDOMEN - 1 VIEW COMPARISON:  Prior radiograph from 05/24/2016. FINDINGS: Enteric tube remains in place with side hole at or just beyond the level of the GE junction. Multiple dilated gas-filled loops of small bowel again seen within the abdomen. These measure up to 6.4 cm in diameter. Visualized loops of colon within normal limits. No definite free air on this limited supine view the abdomen. No soft tissue mass or abnormal calcification. Osseous structures unchanged. IMPRESSION: 1.  Persistent gas filled distended loops of small bowel within the mid and lower abdomen, compatible with small bowel obstruction. Overall, the appearance is interval changed relative to 05/24/2016. 2. NG tube in place. Electronically Signed   By: Jeannine Boga M.D.   On: 05/25/2016 06:07   Dg Abd Portable 1v  Result Date: 05/24/2016 CLINICAL DATA:  NG decompression of partial bowel obstruction/ileus. EXAM: PORTABLE ABDOMEN - 1 VIEW COMPARISON:  One-view abdomen 05/23/2016. FINDINGS: The side port of the NG tube appears to be at or just beyond the GE junction. Stomach is decompressed. Several loops of small small bowel in the central abdomen demonstrate increased distention compared to the based recent films. Some gas gas is again noted in the hepatic flexure of the colon. IMPRESSION: Increased distention of small bowel compatible with small bowel obstruction despite NG tube are placement. Electronically Signed   By: San Morelle M.D.   On: 05/24/2016 15:35   Dg Abd Portable 1v  Result Date: 05/23/2016 CLINICAL DATA:  NG tube placement. EXAM: PORTABLE ABDOMEN - 1 VIEW COMPARISON:  05/23/2016 at 0858 hours FINDINGS: An enteric tube has been placed and overlies the gastric body with tip and side hole well beyond the GE junction. The right side of the abdomen was incompletely imaged, however small bowel dilatation is similar to the study from earlier today. IMPRESSION: 1. Enteric tube overlies the stomach. 2. Unchanged small bowel dilatation concerning for obstruction. Electronically Signed   By: Logan Bores M.D.   On: 05/23/2016 12:37   Ir Fluoro Guide Port Insertion Right  Result Date: 05/24/2016 CLINICAL DATA:  PERITONEAL CARCINOMA WITH ASCITES, ACCESS FOR CHEMO EXAM: RIGHT INTERNAL JUGULAR SINGLE LUMEN POWER PORT CATHETER INSERTION Date:  12/20/201712/20/2017 2:46 pm Radiologist:  M. Daryll Brod, MD Guidance:  Ultrasound and fluoroscopic MEDICATIONS: 2 g Ancef; The antibiotic was  administered within an appropriate time interval prior to skin puncture. ANESTHESIA/SEDATION: Versed 2.0 mg IV; Fentanyl 100 mcg IV; Moderate Sedation Time:  28 minutes The patient was continuously monitored during the procedure by the interventional radiology nurse under my direct supervision. FLUOROSCOPY TIME:  , 30 seconds (4 mGy) COMPLICATIONS: None immediate. CONTRAST:  None. PROCEDURE: Informed consent was obtained from the patient following explanation of the procedure, risks, benefits and alternatives. The patient understands, agrees and consents for the procedure. All questions were addressed. A time out was performed. Maximal barrier sterile technique utilized including caps, mask, sterile gowns, sterile gloves, large sterile drape, hand hygiene, and 2% chlorhexidine scrub. Under sterile conditions and local anesthesia, right internal jugular micropuncture venous access was performed. Access was performed with ultrasound. Images were obtained for documentation. A guide wire was inserted followed by a transitional dilator. This allowed insertion of a guide wire and catheter into the IVC. Measurements were obtained from the  SVC / RA junction back to the right IJ venotomy site. In the right infraclavicular chest, a subcutaneous pocket was created over the second anterior rib. This was done under sterile conditions and local anesthesia. 1% lidocaine with epinephrine was utilized for this. A 2.5 cm incision was made in the skin. Blunt dissection was performed to create a subcutaneous pocket over the right pectoralis major muscle. The pocket was flushed with saline vigorously. There was adequate hemostasis. The port catheter was assembled and checked for leakage. The port catheter was secured in the pocket with two retention sutures. The tubing was tunneled subcutaneously to the right venotomy site and inserted into the SVC/RA junction through a valved peel-away sheath. Position was confirmed with fluoroscopy.  Images were obtained for documentation. The patient tolerated the procedure well. No immediate complications. Incisions were closed in a two layer fashion with 4 - 0 Vicryl suture. Dermabond was applied to the skin. The port catheter was accessed, blood was aspirated followed by saline and heparin flushes. Needle was removed. A dry sterile dressing was applied. IMPRESSION: Ultrasound and fluoroscopically guided right internal jugular single lumen power port catheter insertion. Tip in the SVC/RA junction. Catheter ready for use. Electronically Signed   By: Jerilynn Mages.  Shick M.D.   On: 05/24/2016 14:59    Janes Colegrove, DO  [facility] Pager [phone]  If 7PM-7AM, please contact night-coverage [email or web address] Password [email or web address] 05/25/2016, 6:01 PM   LOS: 4 days

## 2016-05-25 NOTE — Progress Notes (Signed)
Subjective: She denies nausea. Has passed some flatus. She is anxious to start chemo  Objective: Vital signs in last 24 hours: Temp:  [98.4 F (36.9 C)] 98.4 F (36.9 C) (12/21 0450) Pulse Rate:  [79-94] 79 (12/21 0450) Resp:  [16-22] 18 (12/21 0450) BP: (121-157)/(67-79) 141/67 (12/21 0450) SpO2:  [90 %-98 %] 95 % (12/21 0450) Last BM Date: 05/24/16  Intake/Output from previous day: 12/20 0701 - 12/21 0700 In: 260 [P.O.:100; NG/GT:60; IV Piggyback:100] Out: 150 [Emesis/NG output:150] Intake/Output this shift: Total I/O In: 480 [P.O.:480] Out: -   Resp: clear to auscultation bilaterally Cardio: regular rate and rhythm GI: soft, nontender. mild distension  Lab Results:   Recent Labs  05/24/16 0400  WBC 8.0  HGB 12.4  HCT 37.5  PLT 499*   BMET  Recent Labs  05/24/16 0400 05/25/16 0407  NA 138 141  K 4.3 3.7  CL 100* 105  CO2 29 27  GLUCOSE 113* 109*  BUN 21* 19  CREATININE 0.90 0.84  CALCIUM 8.7* 8.5*   PT/INR No results for input(s): LABPROT, INR in the last 72 hours. ABG No results for input(s): PHART, HCO3 in the last 72 hours.  Invalid input(s): PCO2, PO2  Studies/Results: Dg Abd 1 View  Result Date: 05/23/2016 CLINICAL DATA:  Assess nasogastric tube positioning. EXAM: ABDOMEN - 1 VIEW COMPARISON:  Chest x-ray of earlier today. FINDINGS: The nasogastric tube has been partially withdrawn such that the proximal port is at or just below the GE junction. The distal port lies in the proximal gastric fundus. There is a small amount of gas within the stomach. There is moderate gaseous distention of small-bowel loops in the mid abdomen. IMPRESSION: The nasogastric tubes proximal port is at or just below the GE junction. Advancement by 5 cm is recommended to assure that the proximal port does remain below the GE junction. Distended small bowel loops consistent with obstruction. Electronically Signed   By: David  Martinique M.D.   On: 05/23/2016 15:10   Ir US  Guide Vasc Access Right  Result Date: 05/24/2016 CLINICAL DATA:  PERITONEAL CARCINOMA WITH ASCITES, ACCESS FOR CHEMO EXAM: RIGHT INTERNAL JUGULAR SINGLE LUMEN POWER PORT CATHETER INSERTION Date:  12/20/201712/20/2017 2:46 pm Radiologist:  M. Daryll Brod, MD Guidance:  Ultrasound and fluoroscopic MEDICATIONS: 2 g Ancef; The antibiotic was administered within an appropriate time interval prior to skin puncture. ANESTHESIA/SEDATION: Versed 2.0 mg IV; Fentanyl 100 mcg IV; Moderate Sedation Time:  28 minutes The patient was continuously monitored during the procedure by the interventional radiology nurse under my direct supervision. FLUOROSCOPY TIME:  , 30 seconds (4 mGy) COMPLICATIONS: None immediate. CONTRAST:  None. PROCEDURE: Informed consent was obtained from the patient following explanation of the procedure, risks, benefits and alternatives. The patient understands, agrees and consents for the procedure. All questions were addressed. A time out was performed. Maximal barrier sterile technique utilized including caps, mask, sterile gowns, sterile gloves, large sterile drape, hand hygiene, and 2% chlorhexidine scrub. Under sterile conditions and local anesthesia, right internal jugular micropuncture venous access was performed. Access was performed with ultrasound. Images were obtained for documentation. A guide wire was inserted followed by a transitional dilator. This allowed insertion of a guide wire and catheter into the IVC. Measurements were obtained from the SVC / RA junction back to the right IJ venotomy site. In the right infraclavicular chest, a subcutaneous pocket was created over the second anterior rib. This was done under sterile conditions and local anesthesia. 1% lidocaine with epinephrine  was utilized for this. A 2.5 cm incision was made in the skin. Blunt dissection was performed to create a subcutaneous pocket over the right pectoralis major muscle. The pocket was flushed with saline  vigorously. There was adequate hemostasis. The port catheter was assembled and checked for leakage. The port catheter was secured in the pocket with two retention sutures. The tubing was tunneled subcutaneously to the right venotomy site and inserted into the SVC/RA junction through a valved peel-away sheath. Position was confirmed with fluoroscopy. Images were obtained for documentation. The patient tolerated the procedure well. No immediate complications. Incisions were closed in a two layer fashion with 4 - 0 Vicryl suture. Dermabond was applied to the skin. The port catheter was accessed, blood was aspirated followed by saline and heparin flushes. Needle was removed. A dry sterile dressing was applied. IMPRESSION: Ultrasound and fluoroscopically guided right internal jugular single lumen power port catheter insertion. Tip in the SVC/RA junction. Catheter ready for use. Electronically Signed   By: Jerilynn Mages.  Shick M.D.   On: 05/24/2016 14:59   Dg Abd Portable 1v  Result Date: 05/25/2016 CLINICAL DATA:  Initial evaluation for small bowel obstruction. EXAM: PORTABLE ABDOMEN - 1 VIEW COMPARISON:  Prior radiograph from 05/24/2016. FINDINGS: Enteric tube remains in place with side hole at or just beyond the level of the GE junction. Multiple dilated gas-filled loops of small bowel again seen within the abdomen. These measure up to 6.4 cm in diameter. Visualized loops of colon within normal limits. No definite free air on this limited supine view the abdomen. No soft tissue mass or abnormal calcification. Osseous structures unchanged. IMPRESSION: 1. Persistent gas filled distended loops of small bowel within the mid and lower abdomen, compatible with small bowel obstruction. Overall, the appearance is interval changed relative to 05/24/2016. 2. NG tube in place. Electronically Signed   By: Jeannine Boga M.D.   On: 05/25/2016 06:07   Dg Abd Portable 1v  Result Date: 05/24/2016 CLINICAL DATA:  NG decompression  of partial bowel obstruction/ileus. EXAM: PORTABLE ABDOMEN - 1 VIEW COMPARISON:  One-view abdomen 05/23/2016. FINDINGS: The side port of the NG tube appears to be at or just beyond the GE junction. Stomach is decompressed. Several loops of small small bowel in the central abdomen demonstrate increased distention compared to the based recent films. Some gas gas is again noted in the hepatic flexure of the colon. IMPRESSION: Increased distention of small bowel compatible with small bowel obstruction despite NG tube are placement. Electronically Signed   By: San Morelle M.D.   On: 05/24/2016 15:35   Dg Abd Portable 1v  Result Date: 05/23/2016 CLINICAL DATA:  NG tube placement. EXAM: PORTABLE ABDOMEN - 1 VIEW COMPARISON:  05/23/2016 at 0858 hours FINDINGS: An enteric tube has been placed and overlies the gastric body with tip and side hole well beyond the GE junction. The right side of the abdomen was incompletely imaged, however small bowel dilatation is similar to the study from earlier today. IMPRESSION: 1. Enteric tube overlies the stomach. 2. Unchanged small bowel dilatation concerning for obstruction. Electronically Signed   By: Logan Bores M.D.   On: 05/23/2016 12:37   Ir Fluoro Guide Port Insertion Right  Result Date: 05/24/2016 CLINICAL DATA:  PERITONEAL CARCINOMA WITH ASCITES, ACCESS FOR CHEMO EXAM: RIGHT INTERNAL JUGULAR SINGLE LUMEN POWER PORT CATHETER INSERTION Date:  12/20/201712/20/2017 2:46 pm Radiologist:  M. Daryll Brod, MD Guidance:  Ultrasound and fluoroscopic MEDICATIONS: 2 g Ancef; The antibiotic was administered within an  appropriate time interval prior to skin puncture. ANESTHESIA/SEDATION: Versed 2.0 mg IV; Fentanyl 100 mcg IV; Moderate Sedation Time:  28 minutes The patient was continuously monitored during the procedure by the interventional radiology nurse under my direct supervision. FLUOROSCOPY TIME:  , 30 seconds (4 mGy) COMPLICATIONS: None immediate. CONTRAST:  None.  PROCEDURE: Informed consent was obtained from the patient following explanation of the procedure, risks, benefits and alternatives. The patient understands, agrees and consents for the procedure. All questions were addressed. A time out was performed. Maximal barrier sterile technique utilized including caps, mask, sterile gowns, sterile gloves, large sterile drape, hand hygiene, and 2% chlorhexidine scrub. Under sterile conditions and local anesthesia, right internal jugular micropuncture venous access was performed. Access was performed with ultrasound. Images were obtained for documentation. A guide wire was inserted followed by a transitional dilator. This allowed insertion of a guide wire and catheter into the IVC. Measurements were obtained from the SVC / RA junction back to the right IJ venotomy site. In the right infraclavicular chest, a subcutaneous pocket was created over the second anterior rib. This was done under sterile conditions and local anesthesia. 1% lidocaine with epinephrine was utilized for this. A 2.5 cm incision was made in the skin. Blunt dissection was performed to create a subcutaneous pocket over the right pectoralis major muscle. The pocket was flushed with saline vigorously. There was adequate hemostasis. The port catheter was assembled and checked for leakage. The port catheter was secured in the pocket with two retention sutures. The tubing was tunneled subcutaneously to the right venotomy site and inserted into the SVC/RA junction through a valved peel-away sheath. Position was confirmed with fluoroscopy. Images were obtained for documentation. The patient tolerated the procedure well. No immediate complications. Incisions were closed in a two layer fashion with 4 - 0 Vicryl suture. Dermabond was applied to the skin. The port catheter was accessed, blood was aspirated followed by saline and heparin flushes. Needle was removed. A dry sterile dressing was applied. IMPRESSION:  Ultrasound and fluoroscopically guided right internal jugular single lumen power port catheter insertion. Tip in the SVC/RA junction. Catheter ready for use. Electronically Signed   By: Jerilynn Mages.  Shick M.D.   On: 05/24/2016 14:59    Anti-infectives: Anti-infectives    Start     Dose/Rate Route Frequency Ordered Stop   05/24/16 1315  ceFAZolin (ANCEF) IVPB 2g/100 mL premix     2 g 200 mL/hr over 30 Minutes Intravenous To Radiology 05/24/16 1311 05/24/16 1436      Assessment/Plan: s/p * No surgery found * She likely has a partial obstruction secondary to tumor. It is unlikely we can fix this with an operation. We could only hope for some palliative bypass but this is unlikely to prolong her life for any significant time. She will continue with bowel rest and ng suctioning since she is passing flatus. If she does get surgery it will likely delay starting chemotherapy for about a month while she heals. Prognosis very poor. Will follow  LOS: 4 days    TOTH III,Yvonne Petite S 05/25/2016

## 2016-05-26 ENCOUNTER — Other Ambulatory Visit: Payer: Self-pay | Admitting: Oncology

## 2016-05-26 DIAGNOSIS — K219 Gastro-esophageal reflux disease without esophagitis: Secondary | ICD-10-CM

## 2016-05-26 DIAGNOSIS — R112 Nausea with vomiting, unspecified: Secondary | ICD-10-CM

## 2016-05-26 LAB — CBC
HCT: 34.9 % — ABNORMAL LOW (ref 36.0–46.0)
HEMOGLOBIN: 11.6 g/dL — AB (ref 12.0–15.0)
MCH: 29 pg (ref 26.0–34.0)
MCHC: 33.2 g/dL (ref 30.0–36.0)
MCV: 87.3 fL (ref 78.0–100.0)
Platelets: 407 10*3/uL — ABNORMAL HIGH (ref 150–400)
RBC: 4 MIL/uL (ref 3.87–5.11)
RDW: 12.4 % (ref 11.5–15.5)
WBC: 6.9 10*3/uL (ref 4.0–10.5)

## 2016-05-26 LAB — BASIC METABOLIC PANEL
ANION GAP: 9 (ref 5–15)
BUN: 15 mg/dL (ref 6–20)
CHLORIDE: 101 mmol/L (ref 101–111)
CO2: 27 mmol/L (ref 22–32)
Calcium: 8.5 mg/dL — ABNORMAL LOW (ref 8.9–10.3)
Creatinine, Ser: 0.69 mg/dL (ref 0.44–1.00)
GFR calc Af Amer: >60 mL/min (ref >60)
GLUCOSE: 96 mg/dL (ref 65–99)
POTASSIUM: 3.7 mmol/L (ref 3.5–5.1)
SODIUM: 137 mmol/L (ref 135–145)

## 2016-05-26 MED ORDER — INFLUENZA VAC SPLIT QUAD 0.5 ML IM SUSY
0.5000 mL | PREFILLED_SYRINGE | Freq: Once | INTRAMUSCULAR | Status: AC
  Administered 2016-05-28: 0.5 mL via INTRAMUSCULAR

## 2016-05-26 NOTE — Progress Notes (Signed)
Medical Oncology May 26, 2016, 4:19 PM  Hospital day 7 Antibiotics: none Chemotherapy:  last in [place] 02-02-16. Last platinum either May 2017 or early summer 2017. Last avastin at least 01-3016.  EMR reviewed.  Discussed with Dr Grandville Silos on unit. Patient seen with granddaughter (and 75 yo great granddaughter) here.  Subjective: Has been generally comfortable today, no abdominal pain. Has had flatus, no stool.  NG has been clamped for at least 4 hours now, possibly longer, no nausea or discomfort. She walked in hall x2. Has been sitting up in chair.  No SOB or cough. Voiding ok. New PAC functioning well for blood draws and IVF. No bleeding. No oral mucositis.    PAC by IR 05-24-16 Needs flu vaccine if not already done  Oncologic History Patient had hysterectomy without oophorectomy in her 5s for benign indication, and been in her usual good health until July 2016, when she developed abdominal discomfort and bloating. In late 03-2015 she was found to have large pelvic mass and perihepatic ascites. CA 125 then was 359, with CEA of 3, CA 19-9 5.8 and alpha fetoprotein 2.3. She had exploratory laparotomy 04-19-15, which was not done by gyn oncologist, with bilateral ovaried an tumors, 10 cm with capsule ruptured on right , smaller on left, peritoneal carcinomatosis. Washings were done, however accompanying information does not describe extent of debulking. She had some bowel obstruction post operatively, with NG used ~ POD 3. Pathology available 05-05-15 showed cystic and solid left ovarian mass, 13 cm right ovarian mass, moderately differentiated tumor "tumor de celulas de la granulosa". 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 CT AP 122016 had ascites, carcinomatosis and multiple peritoneal implants. CA 125 on 05-24-15 baseline for chemo was 141. She received 3 cycles of taxol + carboplatin from 05-26-15 thru  07-07-15 (not clear if avastin given those cycles), after which carboplatin was not available in [place]. She received taxol 175 mg/m2, CDDP and avastin for 3 additional cycles thru 10-07-15. Second look laparotomy was discussed, patient declined, and instead had 4 cycles avastin + taxol from 12-01-15 thru 02-02-16 (family is confirming, but we believe no CDDP after 10-07-15 and no carboplatin after 07-07-15). CT CAP 02-24-16 reportedly had no evidence of malignancy, with some bullous emphysema, tortuous sigmoid colon with diverticulosis. . Recommendation was for maintenance avastin x 1 year, however patient then came to [facility] in 02-2016. She has not established with any physicians since coming to [place]. CT AP 05-20-16 lias arge volume ascites, extensive peritoneal disease with omental thickening, trace bilateral pleural effusions, left colonic diverticulosis, bilateral inguinal hernias containing fat, post hysterectomy, no adnexal masses, no adenopathy. US paracentesis 05-22-16 for 2.9 liters of yellow fluid. Cytology adenocarcinoma with serous features, immunohistochemical stains confirm gyn.   Objective: Vital signs in last 24 hours: Blood pressure 140/77, pulse 82, temperature 98.3 F (36.8 C), temperature source Oral, resp. rate 20, height 5\' 3"  (1.6 m), weight 176 lb 12.9 oz (80.2 kg), SpO2 96 %. Awake, alert, looks comfortable supine in bed with NG, IVF not hooked up. Respirations not labored. Oral mucosa moist without lesions. Lungs clear ant / lat. Heart RRR. Abdomen mildly distended, but better bowel sounds now (NG not to suction presently), not tender, no clear fluid wave. LE no edema, cords, tenderness. Moves easily in bed. Converses easily with granddaughter, cooperative.   Intake/Output from previous day: 12/21 0701 - 12/22 0700 In: 1320 [P.O.:1320] Out: -  Intake/Output this shift: Total  I/O In: 240 [P.O.:240] Out: 2 [Urine:2]    Lab Results:  Recent Labs  05/24/16 0400 05/26/16 0555  WBC  8.0 6.9  HGB 12.4 11.6*  HCT 37.5 34.9*  PLT 499* 407*   BMET  Recent Labs  05/25/16 0407 05/26/16 0555  NA 141 137  K 3.7 3.7  CL 105 101  CO2 27 27  GLUCOSE 109* 96  BUN 19 15  CREATININE 0.84 0.69  CALCIUM 8.5* 8.5*    Studies/Results: Dg Abd Portable 1v  Result Date: 05/25/2016 CLINICAL DATA:  Initial evaluation for small bowel obstruction. EXAM: PORTABLE ABDOMEN - 1 VIEW COMPARISON:  Prior radiograph from 05/24/2016. FINDINGS: Enteric tube remains in place with side hole at or just beyond the level of the GE junction. Multiple dilated gas-filled loops of small bowel again seen within the abdomen. These measure up to 6.4 cm in diameter. Visualized loops of colon within normal limits. No definite free air on this limited supine view the abdomen. No soft tissue mass or abnormal calcification. Osseous structures unchanged. IMPRESSION: 1. Persistent gas filled distended loops of small bowel within the mid and lower abdomen, compatible with small bowel obstruction. Overall, the appearance is interval changed relative to 05/24/2016. 2. NG tube in place. Electronically Signed   By: Jeannine Boga M.D.   On: 05/25/2016 06:07   Echocardiogram 05-24-16 with EF 65 - 70% and normal wall motion.   DISCUSSION I spoke with oncology inpatient nursing and pharmacy earlier today re possibilities for chemo inpatient over holiday weekend. Soonest staffing can accommodate this will be 12-27, if she is stable for chemo then. I also have infusion time available at [facility] for outpatient chemo on 12-29, if she is stable for DC before chemo can be given.   Recommended only sips clear liquids until can tell that she is tolerating NG being clamped, then would add Resource clear liquid supplement to clears , gradually try full liquids, probably best to really stick to liquids until situation is improved hopefully with chemo.  Tho not clear to me that she is platinum resistant based on history of chemo  as best we can clarify this, but may still be best to start with doxil, which should not drop counts etc. Carbo / gem probably still could be considered later if needed.  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 Progressive within 4 months of taxol + avastin (last 02-02-16); appears thatlast CDDP was 10-2015 andlast carboplatin was 07-07-15, so may not be technically platinum resistant.Marland Kitchen Has become rapidly symptomatic with malignant ascites and peritoneal carcinomatosis in past 2 weeks PTA. Post paracentesis 2.9 liters on 05-22-16. When bowel issues allow, expect to begin chemotherapy, either in hospital or at [facility].  Even with this history, she may get significant benefit from additional systemic treatment, and appears in very good condition otherwise.  2. Small bowel obstruction: at least partial, with marked dilatation by xray, some flatus. Agree with Dr Grandville Silos that we should try clamping NG now. Sips clear liquids only if tolerates. Would not advance beyond full liquids prior to chemo, but hopefully will be able to tolerate clear liquid/ full liquid supplements.  May need maintenance IVF if unable to tolerate much po fluid yet 3.Chronic constipation since childhood per patient. 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 Not symptomatic now. 5.inguinal hernias containing only fat 6.aortic atherosclerosis by CT 7.social  situation: recently moved from [place] to be with daughters in [place], family very supportive. Speaks Spanish, will need interpreter at office  8. Nutrition: no po intake now. 9.PAC placed by IR 10. Echocardiogram with good LVF, done for doxil  Please page [phone] or cell [phone] if needed between my rounds, or med onc on call Evlyn Clines MD

## 2016-05-26 NOTE — Progress Notes (Signed)
Patient ID: Audrey Garza, female   DOB: 05/10/1941, 75 y.o.   MRN: [id number]  The patient and family have requested not to see me anymore. If surgery is needed I would recommend transfer to an academic center. Will sign off

## 2016-05-26 NOTE — Progress Notes (Signed)
Financial papers received from family and placed in her chart

## 2016-05-26 NOTE — Progress Notes (Signed)
PROGRESS NOTE    Audrey Garza  [id number] DOB: [birth year] DOA: 05/20/2016 PCP: No primary care provider on file.    Brief Narrative:  75 year old female with a history of metastatic ovarian cancer presented with progressive abdominal bloating and pain for 3 weeks duration. She was feeling well until the first week of December, 2017 when she was sick with a virus from which she never recovered. She states that she began feeling distended in the abdomen and uncomfortable and was taken to the ED on 05/20/16. The patient reports being diagnosed with stage III ovarian cancer in January, 2017. She received primary debulking surgery followed by 8 adjuvant cycles of carboplatin and paclitaxel followed by an additional 4 cycles of what she describes as maintenance carboplatin, paclitaxel and avastin. Her last dose of therapy was in September 2017   CT chest/abd/pelvis9-21-17 reportedly had no evidence of malignancy, with some bullous emphysema, tortuous sigmoid colon with diverticulosis. . Recommendation was for maintenance avastin x 1 year, however patient then came to [facility] in 02-2016. She has not established with any physicians since coming to [place]. CT AP 05-20-16 lias arge volume ascites, extensive peritoneal disease with omental thickening, trace bilateral pleural effusions, left colonic diverticulosis, bilateral inguinal hernias containing fat, post hysterectomy, no adnexal masses, no adenopathy. US paracentesis 05-22-16 for 2.9 liters of yellow fluid. 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 Unfortunately, after paracentesis she began having increasing abdominal pain. Abdominal x-ray revealed distended small bowel loops concerning for small bowel ejection. Gen. surgery was consulted. NG was inserted to suction.    Assessment & Plan:   Active Problems:   Malignant ascites   Malnutrition of moderate degree   Malignant neoplasm of ovary (HCC)   Small bowel  obstruction   Metastatic ovarian cancer with extensive peritoneal disease with omental thickening with large ascites: -Family brought in prior medical record, but in [place]. -She still seems to have good performance status -appreciate Drs. Marko Plume and Denman George -. 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 Denman George--- not a candidate for interval debulking surgery due to the platinum resistant nature of her disease--if she fails advancement of diet, she should be considered for palliative G tube Oncology following.-  Small Bowel Obstruction -05/23/2016 abdominal x-ray multiple dilated loops of bowel concerning for small bowel obstruction -Likely due to the extensive peritoneal carcinomatosis -12/19--NG tube inserted to suction -Patient now passing flatus and had bowel movement 12/19 and 12/20  -12/21 Abd xray--persistent distended loops of sm bowel c/w SBO -Diet advancement per general surgery-->remain NPO for now--feels she  -Appreciate general surgery consult-->unlikely to fix with an operation - No BM today 05/26/2016. NGT to be clamped per oncology rxcs.  - Per [facility] note, family have requested not to be seen by Dr.Toth III any longer and he has recommended that if surgery is needed we'll recommend transfer to an academic center. General surgery has signed off. - Per general surgery unlikely obstruction can be fixed with an operation report on a low for some palliative bypass however unlikely to prolong patient's life for significant time. Patient with a poor prognosis. -Follow.  Mild hyponatremia:  -Secondary to volume  depletion -Improved with hydration.  Mild thrombocytosis: -likely reactive.  DVT prophylaxis: Lovenox Code Status: Full Family Communication: Updated patient and granddaughter at bedside. Disposition Plan: Home when medically stable and passing gas and having bowel movements.   Consultants:   GYN oncology Dr. Denman George 05/23/2016  Gen. surgery Dr.Toth III 05/23/2016  Oncology: Dr. Marko Plume 05/23/2016  Procedures:   CT abdomen and pelvis 05/20/2016  Abdominal x-rays 05/23/2016, 05/24/2016, 05/25/2016  Right IJ single-lumen power port catheter insertion per Dr. Annamaria Boots 05/24/2016  2-D echo 05/24/2016  Ultrasound-guided paracentesis--2.9 L of clear yellow fluid removed--Dr. Earleen Newport 05/22/2016 cytology positive for adenocarcinoma  Antimicrobials:   IV Ancef 05/24/2016 1 dose.   Subjective: Patient with some nausea. No emesis. Patient denies any flatus or any bowel movement today. The patient and family patient had bowel movement yesterday. Patient with some complaints of lower abdominal pain.  Objective: Vitals:   05/25/16 0450 05/25/16 1430 05/25/16 2210 05/26/16 0609  BP: (!) 141/67 136/71 132/83 (!) 141/73  Pulse: 79 80 78 80  Resp: 18 16 16 20   Temp: 98.4 F (36.9 C) 98.4 F (36.9 C) 98 F (36.7 C) 98 F (36.7 C)  TempSrc: Oral Oral Oral Oral  SpO2: 95% 95% 96% 95%  Weight:      Height:        Intake/Output Summary (Last 24 hours) at 05/26/16 1333 Last data filed at 05/26/16 1250  Gross per 24 hour  Intake             1080 ml  Output                2 ml  Net             1078 ml   Filed Weights   05/20/16 1332 05/20/16 1818  Weight: 78 kg (171 lb 15.3 oz) 80.2 kg (176 lb 12.9 oz)    Examination:  General exam: Appears calm and comfortable.NG tube in place. Respiratory system: Clear to auscultation. Respiratory effort normal. Cardiovascular system: S1 & S2 heard, RRR. No JVD, murmurs, rubs, gallops or clicks. No pedal edema. Gastrointestinal  system: Abdomen is soft, nontender to palpation, positive bowel sounds, nondistended. No organomegaly or masses felt. Central nervous system: Alert and oriented. No focal neurological deficits. Extremities: Symmetric 5 x 5 power. Skin: No rashes, lesions or ulcers Psychiatry: Judgement and insight appear normal. Mood & affect appropriate.     Data Reviewed: I have personally reviewed following labs and imaging studies  CBC:  Recent Labs Lab 05/20/16 1336 05/24/16 0400 05/26/16 0555  WBC 8.8 8.0 6.9  NEUTROABS 5.4 5.1  --   HGB 12.2 12.4 11.6*  HCT 36.6 37.5 34.9*  MCV 88.2 89.7 87.3  PLT 464* 499* [id number]*   Basic Metabolic Panel:  Recent Labs Lab 05/20/16 1336 05/21/16 0351 05/24/16 0400 05/25/16 0407 05/26/16 0555  NA 134* 135 138 141 137  K 4.4 4.6 4.3 3.7 3.7  CL 99* 101 100* 105 101  CO2 27 29 29 27 27   GLUCOSE 116* 113* 113* 109* 96  BUN 13 11 21* 19 15  CREATININE 0.82 0.87 0.90 0.84 0.69  CALCIUM 8.8* 8.8* 8.7* 8.5* 8.5*  MG  --   --  2.3  --   --    GFR: Estimated Creatinine Clearance: 60.9 mL/min (by C-G formula based on SCr of 0.69 mg/dL). Liver Function Tests:  Recent Labs Lab 05/20/16 1336  AST 25  ALT 18  ALKPHOS 73  BILITOT 0.6  PROT 6.7  ALBUMIN 3.7   No results for input(s): LIPASE, AMYLASE  in the last 168 hours. No results for input(s): AMMONIA in the last 168 hours. Coagulation Profile:  Recent Labs Lab 05/21/16 0351  INR 0.96   Cardiac Enzymes: No results for input(s): CKTOTAL, CKMB, CKMBINDEX, TROPONINI in the last 168 hours. BNP (last 3 results) No results for input(s): PROBNP in the last 8760 hours. HbA1C: No results for input(s): HGBA1C in the last 72 hours. CBG: No results for input(s): GLUCAP in the last 168 hours. Lipid Profile: No results for input(s): CHOL, HDL, LDLCALC, TRIG, CHOLHDL, LDLDIRECT in the last 72 hours. Thyroid Function Tests: No results for input(s): TSH, T4TOTAL, FREET4, T3FREE, THYROIDAB in the last  72 hours. Anemia Panel: No results for input(s): VITAMINB12, FOLATE, FERRITIN, TIBC, IRON, RETICCTPCT in the last 72 hours. Sepsis Labs: No results for input(s): PROCALCITON, LATICACIDVEN in the last 168 hours.  Recent Results (from the past 240 hour(s))  Culture, body fluid-bottle     Status: None (Preliminary result)   Collection Time: 05/22/16 11:50 AM  Result Value Ref Range Status   Specimen Description FLUID  Final   Special Requests ASCTIES  Final   Culture   Final    NO GROWTH 4 DAYS Performed at [facility]    Report Status PENDING  Incomplete  Gram stain     Status: None   Collection Time: 05/22/16 11:50 AM  Result Value Ref Range Status   Specimen Description FLUID  Final   Special Requests ASCTIES  Final   Gram Stain   Final    FEW WBC PRESENT,BOTH PMN AND MONONUCLEAR NO ORGANISMS SEEN Performed at [facility]    Report Status 05/22/2016 FINAL  Final         Radiology Studies: Ir US Guide Vasc Access Right  Result Date: 05/24/2016 CLINICAL DATA:  PERITONEAL CARCINOMA WITH ASCITES, ACCESS FOR CHEMO EXAM: RIGHT INTERNAL JUGULAR SINGLE LUMEN POWER PORT CATHETER INSERTION Date:  12/20/201712/20/2017 2:46 pm Radiologist:  M. Daryll Brod, MD Guidance:  Ultrasound and fluoroscopic MEDICATIONS: 2 g Ancef; The antibiotic was administered within an appropriate time interval prior to skin puncture. ANESTHESIA/SEDATION: Versed 2.0 mg IV; Fentanyl 100 mcg IV; Moderate Sedation Time:  28 minutes The patient was continuously monitored during the procedure by the interventional radiology nurse under my direct supervision. FLUOROSCOPY TIME:  , 30 seconds (4 mGy) COMPLICATIONS: None immediate. CONTRAST:  None. PROCEDURE: Informed consent was obtained from the patient following explanation of the procedure, risks, benefits and alternatives. The patient understands, agrees and consents for the procedure. All questions were addressed. A time out was performed. Maximal  barrier sterile technique utilized including caps, mask, sterile gowns, sterile gloves, large sterile drape, hand hygiene, and 2% chlorhexidine scrub. Under sterile conditions and local anesthesia, right internal jugular micropuncture venous access was performed. Access was performed with ultrasound. Images were obtained for documentation. A guide wire was inserted followed by a transitional dilator. This allowed insertion of a guide wire and catheter into the IVC. Measurements were obtained from the SVC / RA junction back to the right IJ venotomy site. In the right infraclavicular chest, a subcutaneous pocket was created over the second anterior rib. This was done under sterile conditions and local anesthesia. 1% lidocaine with epinephrine was utilized for this. A 2.5 cm incision was made in the skin. Blunt dissection was performed to create a subcutaneous pocket over the right pectoralis major muscle. The pocket was flushed with saline vigorously. There was adequate hemostasis. The port catheter was assembled and checked for leakage.  The port catheter was secured in the pocket with two retention sutures. The tubing was tunneled subcutaneously to the right venotomy site and inserted into the SVC/RA junction through a valved peel-away sheath. Position was confirmed with fluoroscopy. Images were obtained for documentation. The patient tolerated the procedure well. No immediate complications. Incisions were closed in a two layer fashion with 4 - 0 Vicryl suture. Dermabond was applied to the skin. The port catheter was accessed, blood was aspirated followed by saline and heparin flushes. Needle was removed. A dry sterile dressing was applied. IMPRESSION: Ultrasound and fluoroscopically guided right internal jugular single lumen power port catheter insertion. Tip in the SVC/RA junction. Catheter ready for use. Electronically Signed   By: Jerilynn Mages.  Shick M.D.   On: 05/24/2016 14:59   Dg Abd Portable 1v  Result Date:  05/25/2016 CLINICAL DATA:  Initial evaluation for small bowel obstruction. EXAM: PORTABLE ABDOMEN - 1 VIEW COMPARISON:  Prior radiograph from 05/24/2016. FINDINGS: Enteric tube remains in place with side hole at or just beyond the level of the GE junction. Multiple dilated gas-filled loops of small bowel again seen within the abdomen. These measure up to 6.4 cm in diameter. Visualized loops of colon within normal limits. No definite free air on this limited supine view the abdomen. No soft tissue mass or abnormal calcification. Osseous structures unchanged. IMPRESSION: 1. Persistent gas filled distended loops of small bowel within the mid and lower abdomen, compatible with small bowel obstruction. Overall, the appearance is interval changed relative to 05/24/2016. 2. NG tube in place. Electronically Signed   By: Jeannine Boga M.D.   On: 05/25/2016 06:07   Dg Abd Portable 1v  Result Date: 05/24/2016 CLINICAL DATA:  NG decompression of partial bowel obstruction/ileus. EXAM: PORTABLE ABDOMEN - 1 VIEW COMPARISON:  One-view abdomen 05/23/2016. FINDINGS: The side port of the NG tube appears to be at or just beyond the GE junction. Stomach is decompressed. Several loops of small small bowel in the central abdomen demonstrate increased distention compared to the based recent films. Some gas gas is again noted in the hepatic flexure of the colon. IMPRESSION: Increased distention of small bowel compatible with small bowel obstruction despite NG tube are placement. Electronically Signed   By: San Morelle M.D.   On: 05/24/2016 15:35   Ir Fluoro Guide Port Insertion Right  Result Date: 05/24/2016 CLINICAL DATA:  PERITONEAL CARCINOMA WITH ASCITES, ACCESS FOR CHEMO EXAM: RIGHT INTERNAL JUGULAR SINGLE LUMEN POWER PORT CATHETER INSERTION Date:  12/20/201712/20/2017 2:46 pm Radiologist:  M. Daryll Brod, MD Guidance:  Ultrasound and fluoroscopic MEDICATIONS: 2 g Ancef; The antibiotic was administered within  an appropriate time interval prior to skin puncture. ANESTHESIA/SEDATION: Versed 2.0 mg IV; Fentanyl 100 mcg IV; Moderate Sedation Time:  28 minutes The patient was continuously monitored during the procedure by the interventional radiology nurse under my direct supervision. FLUOROSCOPY TIME:  , 30 seconds (4 mGy) COMPLICATIONS: None immediate. CONTRAST:  None. PROCEDURE: Informed consent was obtained from the patient following explanation of the procedure, risks, benefits and alternatives. The patient understands, agrees and consents for the procedure. All questions were addressed. A time out was performed. Maximal barrier sterile technique utilized including caps, mask, sterile gowns, sterile gloves, large sterile drape, hand hygiene, and 2% chlorhexidine scrub. Under sterile conditions and local anesthesia, right internal jugular micropuncture venous access was performed. Access was performed with ultrasound. Images were obtained for documentation. A guide wire was inserted followed by a transitional dilator. This allowed insertion of a  guide wire and catheter into the IVC. Measurements were obtained from the SVC / RA junction back to the right IJ venotomy site. In the right infraclavicular chest, a subcutaneous pocket was created over the second anterior rib. This was done under sterile conditions and local anesthesia. 1% lidocaine with epinephrine was utilized for this. A 2.5 cm incision was made in the skin. Blunt dissection was performed to create a subcutaneous pocket over the right pectoralis major muscle. The pocket was flushed with saline vigorously. There was adequate hemostasis. The port catheter was assembled and checked for leakage. The port catheter was secured in the pocket with two retention sutures. The tubing was tunneled subcutaneously to the right venotomy site and inserted into the SVC/RA junction through a valved peel-away sheath. Position was confirmed with fluoroscopy. Images were obtained  for documentation. The patient tolerated the procedure well. No immediate complications. Incisions were closed in a two layer fashion with 4 - 0 Vicryl suture. Dermabond was applied to the skin. The port catheter was accessed, blood was aspirated followed by saline and heparin flushes. Needle was removed. A dry sterile dressing was applied. IMPRESSION: Ultrasound and fluoroscopically guided right internal jugular single lumen power port catheter insertion. Tip in the SVC/RA junction. Catheter ready for use. Electronically Signed   By: Jerilynn Mages.  Shick M.D.   On: 05/24/2016 14:59        Scheduled Meds: . enoxaparin (LOVENOX) injection  40 mg Subcutaneous Q24H  . feeding supplement (ENSURE ENLIVE)  237 mL Oral BID BM  . pantoprazole (PROTONIX) IV  40 mg Intravenous Q24H  . senna-docusate  1 tablet Oral BID  . zolpidem  5 mg Oral Once   Continuous Infusions:   LOS: 5 days    Time spent: 40 mins   Charmon Thorson, MD [facility] Pager [phone]  If 7PM-7AM, please contact night-coverage [email or web address] Password [email or web address] 05/26/2016, 1:33 PM

## 2016-05-26 NOTE — Progress Notes (Signed)
START ON PATHWAY REGIMEN - Ovarian  [id number]: Liposomal Doxorubicin (Doxil) 40 mg/m2 q28 Days; Stop Treatment After 6 Cycles If Complete Response, Otherwise Continue Treatment Until Progression or Unacceptable Toxicity   A cycle is every 28 days:     Liposomal Doxorubicin (Doxil(R)) 40 mg/m2 in 250 mL D5W IV over 60 minutes, initial infusion rate should not exceed 1 mg/min Dose Mod: None  **Always confirm dose/schedule in your pharmacy ordering system**    Patient Characteristics: Second Line Therapy, < 6 Months Stage Grouping: IIIC AJCC T Stage: X AJCC N Stage: X AJCC M Stage: X Line of Therapy: Second Line Therapy BRCA Mutation Status: Awaiting Test Results Would you be surprised if this patient died  in the next year? I would be surprised if this patient died in the next year Time since last treatment: < 6 Months  Intent of Therapy: Non-Curative / Palliative Intent, Discussed with Patient

## 2016-05-27 ENCOUNTER — Inpatient Hospital Stay (HOSPITAL_COMMUNITY): Payer: Medicaid Other

## 2016-05-27 DIAGNOSIS — T85598A Other mechanical complication of other gastrointestinal prosthetic devices, implants and grafts, initial encounter: Secondary | ICD-10-CM

## 2016-05-27 LAB — CBC WITH DIFFERENTIAL/PLATELET
Basophils Absolute: 0 10*3/uL (ref 0.0–0.1)
Basophils Relative: 0 %
EOS PCT: 1 %
Eosinophils Absolute: 0.1 10*3/uL (ref 0.0–0.7)
HCT: 37.9 % (ref 36.0–46.0)
HEMOGLOBIN: 12.5 g/dL (ref 12.0–15.0)
LYMPHS ABS: 1.9 10*3/uL (ref 0.7–4.0)
LYMPHS PCT: 24 %
MCH: 29 pg (ref 26.0–34.0)
MCHC: 33 g/dL (ref 30.0–36.0)
MCV: 87.9 fL (ref 78.0–100.0)
MONOS PCT: 11 %
Monocytes Absolute: 0.9 10*3/uL (ref 0.1–1.0)
Neutro Abs: 5.1 10*3/uL (ref 1.7–7.7)
Neutrophils Relative %: 64 %
PLATELETS: 498 10*3/uL — AB (ref 150–400)
RBC: 4.31 MIL/uL (ref 3.87–5.11)
RDW: 12.6 % (ref 11.5–15.5)
WBC: 8 10*3/uL (ref 4.0–10.5)

## 2016-05-27 LAB — URINE CULTURE: Culture: <10000 — AB

## 2016-05-27 LAB — BASIC METABOLIC PANEL
Anion gap: 9 (ref 5–15)
BUN: 13 mg/dL (ref 6–20)
CALCIUM: 8.6 mg/dL — AB (ref 8.9–10.3)
CHLORIDE: 100 mmol/L — AB (ref 101–111)
CO2: 27 mmol/L (ref 22–32)
CREATININE: 0.85 mg/dL (ref 0.44–1.00)
GFR calc non Af Amer: >60 mL/min (ref >60)
GLUCOSE: 131 mg/dL — AB (ref 65–99)
Potassium: 3.7 mmol/L (ref 3.5–5.1)
Sodium: 136 mmol/L (ref 135–145)

## 2016-05-27 LAB — CULTURE, BODY FLUID W GRAM STAIN -BOTTLE: Culture: NO GROWTH

## 2016-05-27 LAB — MAGNESIUM: Magnesium: 2 mg/dL (ref 1.7–2.4)

## 2016-05-27 MED ORDER — BOOST / RESOURCE BREEZE PO LIQD
1.0000 | Freq: Three times a day (TID) | ORAL | Status: DC
  Administered 2016-05-27 (×3): 1 via ORAL

## 2016-05-27 NOTE — Progress Notes (Signed)
NG tube removed per MD order. Patient tolerated procedure well. Will continue to monitor.

## 2016-05-27 NOTE — Progress Notes (Signed)
Printed information for doxil provided to patient and family in [place] per MD order.

## 2016-05-27 NOTE — Progress Notes (Signed)
PROGRESS NOTE    Audrey Garza  [id number] DOB: February 17, 1941 DOA: 05/20/2016 PCP: No primary care provider on file.    Brief Narrative:  75 year old female with a history of metastatic ovarian cancer presented with progressive abdominal bloating and pain for 3 weeks duration. She was feeling well until the first week of December, 2017 when she was sick with a virus from which she never recovered. She states that she began feeling distended in the abdomen and uncomfortable and was taken to the ED on 05/20/16. The patient reports being diagnosed with stage III ovarian cancer in January, 2017. She received primary debulking surgery followed by 8 adjuvant cycles of carboplatin and paclitaxel followed by an additional 4 cycles of what she describes as maintenance carboplatin, paclitaxel and avastin. Her last dose of therapy was in September 2017   CT chest/abd/pelvis9-21-17 reportedly had no evidence of malignancy, with some bullous emphysema, tortuous sigmoid colon with diverticulosis. . Recommendation was for maintenance avastin x 1 year, however patient then came to [facility] in 02-2016. She has not established with any physicians since coming to [place]. CT AP 05-20-16 lias arge volume ascites, extensive peritoneal disease with omental thickening, trace bilateral pleural effusions, left colonic diverticulosis, bilateral inguinal hernias containing fat, post hysterectomy, no adnexal masses, no adenopathy. US paracentesis 05-22-16 for 2.9 liters of yellow fluid. 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 Unfortunately, after paracentesis she began having increasing abdominal pain. Abdominal x-ray revealed distended small bowel loops concerning for small bowel ejection. Gen. surgery was consulted. NG was inserted to suction.    Assessment & Plan:   Active Problems:   Malignant ascites   Malnutrition of moderate degree   Malignant neoplasm of ovary (HCC)   Small bowel  obstruction   Gastroesophageal reflux disease   N&V (nausea and vomiting)   Metastatic ovarian cancer with extensive peritoneal disease with omental thickening with large ascites: -Family brought in prior medical record, but in [place]. -She still seems to have good performance status -appreciate Drs. Marko Plume and Denman George -. 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  -Per Dr. Denman George--- not a candidate for interval debulking surgery due to the platinum resistant nature of her disease--if she fails advancement of diet, she should be considered for palliative G tube Oncology following.-  Small Bowel Obstruction -05/23/2016 abdominal x-ray multiple dilated loops of bowel concerning for small bowel obstruction -Likely due to the extensive peritoneal carcinomatosis -12/19--NG tube inserted to suction -Patient now passing flatus and had bowel movement 12/19 and 12/20  -12/21 Abd xray--persistent distended loops of sm bowel c/w SBO -Diet advancement per general surgery-->remain NPO for now--feels she  -Appreciate general surgery consult-->unlikely to fix with an operation - No BM today 05/26/2016. NGT clamped per oncology rxcs since last night 05/26/2016. -05/27/2016 patient passing flatus. No bowel movement. Burping. Patient denies any abdominal pain. NG tube has been clamped since last night and patient with no nausea no emesis no abdominal pain. Plain films from 05/27/2016 with findings compatible with a resolving or resolved small bowel obstruction. - We'll discontinue NG tube. Continue clear liquids for now and if patient continues to  tolerate it will advance to a full liquids and no further than  full liquids per oncology recommendations. - Per [facility] note, family have requested not to be seen by Dr.Toth III any longer and he has recommended that if surgery is needed recommend transfer to an academic center. General surgery has signed off. - Per general surgery unlikely obstruction can be fixed with an operation report on a low for some palliative bypass however unlikely to prolong patient's life for significant time. Patient with a poor prognosis. -Follow.  Mild hyponatremia:  -Secondary to volume depletion -Improved with hydration.  Mild thrombocytosis: -likely reactive.    DVT prophylaxis: Lovenox Code Status: Full Family Communication: Updated patient and family at bedside. Disposition Plan: Home when medically stable and passing gas and having bowel movements.   Consultants:   GYN oncology Dr. Denman George 05/23/2016  Gen. surgery Dr.Toth III 05/23/2016  Oncology: Dr. Marko Plume 05/23/2016  Procedures:   CT abdomen and pelvis 05/20/2016  Abdominal x-rays 05/23/2016, 05/24/2016, 05/25/2016, 05/27/2016  Right IJ single-lumen power port catheter insertion per Dr. Annamaria Boots 05/24/2016  2-D echo 05/24/2016  Ultrasound-guided paracentesis--2.9 L of clear yellow fluid removed--Dr. Earleen Newport 05/22/2016 cytology positive for adenocarcinoma  Antimicrobials:   IV Ancef 05/24/2016 1 dose.   Subjective: Patient denies nausea. No emesis. Patient passing flatus. No bowel movement yet. Patient burping during interview. Patient ambulated in the hallways yesterday and today. Patient states feeling better.   Objective: Vitals:   05/26/16 0609 05/26/16 1446 05/26/16 2148 05/27/16 0554  BP: (!) 141/73 140/77 (!) 141/88 134/78  Pulse: 80 82 87 78  Resp: 20 20 20 20   Temp: 98 F (36.7 C) 98.3 F (36.8 C) 98.2 F (36.8 C) 98.7 F (37.1 C)  TempSrc: Oral Oral Oral Oral  SpO2: 95% 96% 95% 94%  Weight:      Height:         Intake/Output Summary (Last 24 hours) at 05/27/16 1116 Last data filed at 05/26/16 1900  Gross per 24 hour  Intake              120 ml  Output                2 ml  Net              118 ml   Filed Weights   05/20/16 1332 05/20/16 1818  Weight: 78 kg (171 lb 15.3 oz) 80.2 kg (176 lb 12.9 oz)    Examination:  General exam: Appears calm and comfortable.NG tube in place. Respiratory system: Clear to auscultation. Respiratory effort normal. Cardiovascular system: S1 & S2 heard, RRR. No JVD, murmurs, rubs, gallops or clicks. No pedal edema. Gastrointestinal system: Abdomen is soft, nontender to palpation, positive bowel sounds, nondistended. No organomegaly or masses felt. Central nervous system: Alert and oriented. No focal neurological deficits. Extremities: Symmetric 5 x 5 power. Skin: No rashes, lesions or ulcers Psychiatry: Judgement and insight appear normal. Mood & affect appropriate.     Data Reviewed: I have personally reviewed following labs and imaging studies  CBC:  Recent Labs Lab 05/20/16 1336 05/24/16 0400 05/26/16 0555  WBC 8.8 8.0 6.9  NEUTROABS 5.4 5.1  --   HGB 12.2 12.4 11.6*  HCT 36.6 37.5 34.9*  MCV 88.2 89.7 87.3  PLT 464* 499* [id number]   Basic Metabolic Panel:  Recent Labs Lab 05/20/16 1336 05/21/16 0351 05/24/16 0400 05/25/16 0407 05/26/16 0555  NA 134* 135 138 141 137  K 4.4 4.6 4.3 3.7 3.7  CL 99* 101 100* 105 101  CO2 27 29 29 27  27  GLUCOSE 116* 113* 113* 109* 96  BUN 13 11 21* 19 15  CREATININE 0.82 0.87 0.90 0.84 0.69  CALCIUM 8.8* 8.8* 8.7* 8.5* 8.5*  MG  --   --  2.3  --   --    GFR: Estimated Creatinine Clearance: 60.9 mL/min (by C-G formula based on SCr of 0.69 mg/dL). Liver Function Tests:  Recent Labs Lab 05/20/16 1336  AST 25  ALT 18  ALKPHOS 73  BILITOT 0.6  PROT 6.7  ALBUMIN 3.7   No results for input(s): LIPASE, AMYLASE in the last 168 hours. No results for input(s): AMMONIA in the last 168  hours. Coagulation Profile:  Recent Labs Lab 05/21/16 0351  INR 0.96   Cardiac Enzymes: No results for input(s): CKTOTAL, CKMB, CKMBINDEX, TROPONINI in the last 168 hours. BNP (last 3 results) No results for input(s): PROBNP in the last 8760 hours. HbA1C: No results for input(s): HGBA1C in the last 72 hours. CBG: No results for input(s): GLUCAP in the last 168 hours. Lipid Profile: No results for input(s): CHOL, HDL, LDLCALC, TRIG, CHOLHDL, LDLDIRECT in the last 72 hours. Thyroid Function Tests: No results for input(s): TSH, T4TOTAL, FREET4, T3FREE, THYROIDAB in the last 72 hours. Anemia Panel: No results for input(s): VITAMINB12, FOLATE, FERRITIN, TIBC, IRON, RETICCTPCT in the last 72 hours. Sepsis Labs: No results for input(s): PROCALCITON, LATICACIDVEN in the last 168 hours.  Recent Results (from the past 240 hour(s))  Culture, body fluid-bottle     Status: None (Preliminary result)   Collection Time: 05/22/16 11:50 AM  Result Value Ref Range Status   Specimen Description FLUID  Final   Special Requests ASCTIES  Final   Culture   Final    NO GROWTH 4 DAYS Performed at [facility]    Report Status PENDING  Incomplete  Gram stain     Status: None   Collection Time: 05/22/16 11:50 AM  Result Value Ref Range Status   Specimen Description FLUID  Final   Special Requests ASCTIES  Final   Gram Stain   Final    FEW WBC PRESENT,BOTH PMN AND MONONUCLEAR NO ORGANISMS SEEN Performed at [facility]    Report Status 05/22/2016 FINAL  Final  Urine culture     Status: Abnormal   Collection Time: 05/25/16 10:48 PM  Result Value Ref Range Status   Specimen Description URINE, CLEAN CATCH  Final   Special Requests NONE  Final   Culture (A)  Final    <10,000 COLONIES/mL INSIGNIFICANT GROWTH Performed at [facility]    Report Status 05/27/2016 FINAL  Final         Radiology Studies: No results found.      Scheduled Meds: . enoxaparin  (LOVENOX) injection  40 mg Subcutaneous Q24H  . feeding supplement  1 Container Oral TID BM  . feeding supplement (ENSURE ENLIVE)  237 mL Oral BID BM  . Influenza vac split quadrivalent PF  0.5 mL Intramuscular Once  . pantoprazole (PROTONIX) IV  40 mg Intravenous Q24H  . senna-docusate  1 tablet Oral BID  . zolpidem  5 mg Oral Once   Continuous Infusions:   LOS: 6 days    Time spent: 74 mins   THOMPSON,DANIEL, MD [facility] Pager [phone]  If 7PM-7AM, please contact night-coverage [email or web address] Password [facility] 05/27/2016, 11:16 AM

## 2016-05-27 NOTE — Progress Notes (Signed)
MEDICAL ONCOLOGY May 27, 2016, 10:31 AM  Hospital day 8 Antibiotics: none Chemotherapy:  last in [place] 02-02-16. Last platinum either May 2017 or early summer 2017. Last avastin at least 01-3016.  No local physicians PTA   Abd xray pending this AM Has not had flu vaccine per this EMR; granddaughter and patient do not think she has had this elsewhere and she is in agreement with vaccine. Order placed.   Subjective: Up in room easily, showered this AM, did sleep last night. Reports PAC got wet in shower. Tolerating NG clamped, has been sipping some juice and some gatorade, no clear liquid supplements yet. Denies abdominal pain or nausea, is passing gas, no stool. Belching during visit. Slight rhinorrhea possibly from NG. No lower respiratory symptoms. No other pain. No IVF overnight, is voiding.  Asking when she can leave hospital.      [facility] by IR 05-24-16 Needs flu vaccine if not already done  Oncologic History Patient had hysterectomy without oophorectomy in her 48s for benign indication, and been in her usual good health until July 2016, when she developed abdominal discomfort and bloating. In late 03-2015 she was found to have large pelvic mass and perihepatic ascites. CA 125 then was 359, with CEA of 3, CA 19-9 5.8 and alpha fetoprotein 2.3. She had exploratory laparotomy 04-19-15, which was not done by gyn oncologist, with bilateral ovaried an tumors, 10 cm with capsule ruptured on right , smaller on left, peritoneal carcinomatosis. Washings were done, however accompanying information does not describe extent of debulking. She had some bowel obstruction post operatively, with NG used ~ POD 3. Pathology available 05-05-15 showed cystic and solid left ovarian mass, 13 cm right ovarian mass, moderately differentiated tumor "tumor de celulas de la granulosa". 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 CT AP  122016 had ascites, carcinomatosis and multiple peritoneal implants. CA 125 on 05-24-15 baseline for chemo was 141. She received 3 cycles of taxol + carboplatin from 05-26-15 thru 07-07-15 (not clear if avastin given those cycles), after which carboplatin was not available in [place]. She received taxol 175 mg/m2, CDDP and avastin for 3 additional cycles thru 10-07-15. Second look laparotomy was discussed, patient declined, and instead had 4 cycles avastin + taxol from 12-01-15 thru 02-02-16 (family is confirming, but we believe no CDDP after 10-07-15 and no carboplatin after 07-07-15). CT CAP 02-24-16 reportedly had no evidence of malignancy, with some bullous emphysema, tortuous sigmoid colon with diverticulosis. . Recommendation was for maintenance avastin x 1 year, however patient then came to [facility] in 02-2016. She has not established with any physicians since coming to [place]. CT AP 05-20-16 lias arge volume ascites, extensive peritoneal disease with omental thickening, trace bilateral pleural effusions, left colonic diverticulosis, bilateral inguinal hernias containing fat, post hysterectomy, no adnexal masses, no adenopathy. US paracentesis 05-22-16 for 2.9 liters of yellow fluid. Cytology adenocarcinoma withserous features, immunohistochemical stains confirm gyn.   Objective: Vital signs in last 24 hours: Blood pressure 134/78, pulse 78, temperature 98.7 F (37.1 C), temperature source Oral, resp. rate 20, height 5\' 3"  (1.6 m), weight 176 lb 12.9 oz (80.2 kg), SpO2 94 %. Easily ambulatory. PERRL, not icteric. Slight clear rhinorrhea.  NG clamped. Lungs clear to bases. Heart RRR. PAC locked, site ok, not clearly wet beneath dressing. Abdomen no more distended, some normal bowel sounds, not tender. LE no edema, cords, tenderness. Feet warm.   Intake/Output from previous day: 12/22 0701 -  12/23 0700 In: 240 [P.O.:240] Out: 4 [Urine:4] Intake/Output this shift: No intake/output data recorded.    Lab  Results:  Recent Labs  05/26/16 0555  WBC 6.9  HGB 11.6*  HCT 34.9*  PLT 407*   BMET  Recent Labs  05/25/16 0407 05/26/16 0555  NA 141 137  K 3.7 3.7  CL 105 101  CO2 27 27  GLUCOSE 109* 96  BUN 19 15  CREATININE 0.84 0.69  CALCIUM 8.5* 8.5*    Studies/Results: No results found.  Abd Xray pending   DISCUSSION:  Clinically seems to be tolerating NG clamped and sips of clears, xray pending due to significant small bowel dilatation previously. I have told them that it will be best to stay on liquid diet after able to remove NG, until some improvement with chemo. Resource supplement ordered.  WIll give information on doxil in Spanish if possible, as we hope to give this either in hospital on 12-26 or at [facility] on 12-29 if bowel obstruction allows.  Patient appears otherwise very strong and in good health, is very motivated to improve situation as possible. It is not clear from best history possible that she had platinum chemo<= 6 months prior to this recurrence, so may in fact not be platinum resistant. From medical oncology standpoint, she may benefit from additional systemic treatment if bowel obstruction improves/ resolves.    Assessment/Plan:  1.advanced ovarian carcinoma: Bilateral ovarian by history, adeno likely high grade serous by cytology now, IIIC. Progressive within 4 months of taxol + avastin (last 02-02-16); appears thatlast CDDP was 10-2015 andlast carboplatin was 07-07-15, so may not be technically platinum resistant.. Rapidly symptomatic with malignant ascites and peritoneal carcinomatosis in past 2 weeks PTA. Post paracentesis 2.9 liters on 05-22-16. When bowel issues allow, expect to begin chemotherapy, either in hospital or at [facility].  Even with this history, she may get significant benefit from additional systemic treatment, and appears in very good condition otherwise.  2. Small bowel obstruction: at least partial, with marked dilatation by xray 12-21, some  flatus, tolerating sips clears and NG clamped since last pm . Follow up xray pending.  Would not advance beyond full liquids prior to chemo, but hopefully will be able to tolerate clear liquid/ full liquid supplements.  May need maintenance IVF if unable to tolerate much po fluid yet 3.Chronic constipation x years prior to this illness. 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 Not symptomatic now. 5.inguinal hernias containing only fat 6.aortic atherosclerosis by CT 7.social situation: recently moved from [place] to be with daughters in [place], family very supportive. Speaks Spanish, will need interpreter at office  8. Nutrition: no good po intake beginning 2-3 days prior to admission, ongoing.  9.PAC placed by IR 10. Echocardiogram with good LVF, done for doxil 11.Needs flu vaccine especially with chemo planned, patient agrees. Order placed (tho may not be correct as not given last pm)  Please page [phone] or cell [phone] if needed between my rounds, or med onc on call  [facility]

## 2016-05-27 NOTE — Progress Notes (Signed)
Per MD order question whether PAC dressing needs changing. Dressing clean dry and intact and within date. No need to change at this time. Will continue to monitor.

## 2016-05-28 ENCOUNTER — Inpatient Hospital Stay (HOSPITAL_COMMUNITY): Payer: Medicaid Other

## 2016-05-28 DIAGNOSIS — K567 Ileus, unspecified: Secondary | ICD-10-CM

## 2016-05-28 LAB — MAGNESIUM: Magnesium: 1.8 mg/dL (ref 1.7–2.4)

## 2016-05-28 LAB — CBC WITH DIFFERENTIAL/PLATELET
BASOS PCT: 0 %
Basophils Absolute: 0 10*3/uL (ref 0.0–0.1)
EOS ABS: 0.1 10*3/uL (ref 0.0–0.7)
EOS PCT: 1 %
HCT: 34.6 % — ABNORMAL LOW (ref 36.0–46.0)
Hemoglobin: 11.5 g/dL — ABNORMAL LOW (ref 12.0–15.0)
LYMPHS ABS: 1.4 10*3/uL (ref 0.7–4.0)
Lymphocytes Relative: 20 %
MCH: 29 pg (ref 26.0–34.0)
MCHC: 33.2 g/dL (ref 30.0–36.0)
MCV: 87.2 fL (ref 78.0–100.0)
MONOS PCT: 12 %
Monocytes Absolute: 0.8 10*3/uL (ref 0.1–1.0)
NEUTROS PCT: 67 %
Neutro Abs: 4.7 10*3/uL (ref 1.7–7.7)
PLATELETS: 429 10*3/uL — AB (ref 150–400)
RBC: 3.97 MIL/uL (ref 3.87–5.11)
RDW: 12.6 % (ref 11.5–15.5)
WBC: 7 10*3/uL (ref 4.0–10.5)

## 2016-05-28 LAB — BASIC METABOLIC PANEL
ANION GAP: 9 (ref 5–15)
BUN: 9 mg/dL (ref 6–20)
CALCIUM: 8 mg/dL — AB (ref 8.9–10.3)
CO2: 25 mmol/L (ref 22–32)
CREATININE: 0.77 mg/dL (ref 0.44–1.00)
Chloride: 100 mmol/L — ABNORMAL LOW (ref 101–111)
GFR calc non Af Amer: >60 mL/min (ref >60)
Glucose, Bld: 147 mg/dL — ABNORMAL HIGH (ref 65–99)
Potassium: 3.3 mmol/L — ABNORMAL LOW (ref 3.5–5.1)
Sodium: 134 mmol/L — ABNORMAL LOW (ref 135–145)

## 2016-05-28 MED ORDER — DEXTROSE-NACL 5-0.9 % IV SOLN
INTRAVENOUS | Status: DC
  Administered 2016-05-28 – 2016-05-31 (×5): via INTRAVENOUS

## 2016-05-28 MED ORDER — DEXTROSE-NACL 5-0.9 % IV SOLN
INTRAVENOUS | Status: DC
  Filled 2016-05-28: qty 1000

## 2016-05-28 MED ORDER — MAGNESIUM SULFATE 4 GM/100ML IV SOLN
4.0000 g | Freq: Once | INTRAVENOUS | Status: AC
  Administered 2016-05-28: 4 g via INTRAVENOUS
  Filled 2016-05-28: qty 100

## 2016-05-28 MED ORDER — BOOST / RESOURCE BREEZE PO LIQD
1.0000 | Freq: Four times a day (QID) | ORAL | Status: DC
  Administered 2016-05-28 – 2016-06-06 (×3): 1 via ORAL

## 2016-05-28 MED ORDER — POTASSIUM CHLORIDE 10 MEQ/100ML IV SOLN
10.0000 meq | INTRAVENOUS | Status: AC
  Administered 2016-05-28 (×3): 10 meq via INTRAVENOUS
  Filled 2016-05-28 (×3): qty 100

## 2016-05-28 MED ORDER — SODIUM CHLORIDE 0.9 % IV SOLN: 30.0000 meq | Freq: Once | INTRAVENOUS | Status: DC

## 2016-05-28 NOTE — Progress Notes (Signed)
Patient ID: Audrey Garza, female   DOB: 06-04-1941, 75 y.o.   MRN: [id number]   US paracentesis requested  [place] Limited abdomen performed No fluid seen in 3 quadrants. Small amount of fluid in RUQ No safe window to access as liver within fluid. NO paracentesis performed  Consider reattempt in 48 hrs or so if feel needed.

## 2016-05-28 NOTE — Progress Notes (Signed)
MEDICAL ONCOLOGY May 28, 2016, 9:32 AM  Hospital day 9 Antibiotics: none Chemotherapy:  last in [place] 02-02-16. Last platinum either May 2017 or early summer 2017. Last avastin at least 01-3016.  No local physicians PTA   EMR reviewed. Patient seen, with granddaughter here. Discussed with unit RN, hospital pharmacist and radiology.   Subjective: Plain Xray showed resolution of SBO yesterday, had formed BM and NG removed. Walked in hall 12 laps yesterday and sat up in chair most of day. Likes Resource supplement, does not tolerate the Ensure Enlive as it has milk in ingredients/ she is lactose intolerant.  Today she feels more abdominal fullness similar to, but not as severe as prior to 2.9 liter paracentesis on 12-18, no nausea or vomiting but fills up quickly with po fluids, is passing flatus. No abdominal pain. Only able to walk 1 lap in hall this AM due to abdominal fullness, did tolerate shower. Denies SOB, cough. PAC fine. No bleeding. No LE swelling. No mouth soreness. Did rest last pm. Remainder of 10 point Review of Systems negative.   PAC by IR 05-24-16 Needs flu vaccine, which family confirms she has not had this fall. Confirmed order with pharmacy and RN.    Oncologic History Patient had hysterectomy without oophorectomy in her 21s for benign indication, and been in her usual good health until July 2016, when she developed abdominal discomfort and bloating. In late 03-2015 she was found to have large pelvic mass and perihepatic ascites. CA 125 then was 359, with CEA of 3, CA 19-9 5.8 and alpha fetoprotein 2.3. She had exploratory laparotomy 04-19-15, which was not done by gyn oncologist, with bilateral ovaried an tumors, 10 cm with capsule ruptured on right , smaller on left, peritoneal carcinomatosis. Washings were done, however accompanying information does not describe extent of debulking. She had some bowel obstruction post operatively, with NG used ~ POD 3. Pathology  available 05-05-15 showed cystic and solid left ovarian mass, 13 cm right ovarian mass, moderately differentiated tumor "tumor de celulas de la granulosa". 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 CT AP 122016 had ascites, carcinomatosis and multiple peritoneal implants. CA 125 on 05-24-15 baseline for chemo was 141. She received 3 cycles of taxol + carboplatin from 05-26-15 thru 07-07-15 (not clear if avastin given those cycles), after which carboplatin was not available in [place]. She received taxol 175 mg/m2, CDDP and avastin for 3 additional cycles thru 10-07-15. Second look laparotomy was discussed, patient declined, and instead had 4 cycles avastin + taxol from 12-01-15 thru 02-02-16 (family is confirming, but we believe no CDDP after 10-07-15 and no carboplatin after 07-07-15). CT CAP 02-24-16 reportedly had no evidence of malignancy, with some bullous emphysema, tortuous sigmoid colon with diverticulosis. . Recommendation was for maintenance avastin x 1 year, however patient then came to [facility] in 02-2016. She has not established with any physicians since coming to [place]. CT AP 05-20-16 lias arge volume ascites, extensive peritoneal disease with omental thickening, trace bilateral pleural effusions, left colonic diverticulosis, bilateral inguinal hernias containing fat, post hysterectomy, no adnexal masses, no adenopathy. US paracentesis 05-22-16 for 2.9 liters of yellow fluid. Cytology adenocarcinoma withserous features, immunohistochemical stains confirm gyn.   Objective: Vital signs in last 24 hours: Blood pressure 131/74, pulse 82, temperature 98.4 F (36.9 C), temperature source Oral, resp. rate 18, height 5\' 3"  (1.6 m), weight 176 lb 12.9 oz (80.2 kg), SpO2 96 %.   Intake/Output from  previous day: 12/23 0701 - 12/24 0700 In: 1360 [P.O.:1360] Out: -  Intake/Output this shift: No intake/output data recorded.  Physical exam:  Awake, alert, NAD supine in bed on RA with NG out. Very pleasant and cooperative, converses easily with granddaughter. PERRL, not icteric. Oral mucosa moist without lesions. No JVD. PAC locked, site fine, no significant bruising. Heart RRR. Lungs clear. Abdomen clearly more distended than my last 2 exams, a few BS lower quadrants and LUQ, not tender, apparent fluid wave. LE no edema, cords, tenderness, feet warm. Moves all extremities and repositions easily in bed.   Lab Results:  Recent Labs  05/26/16 0555 05/27/16 1040  WBC 6.9 8.0  HGB 11.6* 12.5  HCT 34.9* 37.9  PLT 407* 498*   BMET  Recent Labs  05/26/16 0555 05/27/16 1040  NA 137 136  K 3.7 3.7  CL 101 100*  CO2 27 27  GLUCOSE 96 131*  BUN 15 13  CREATININE 0.69 0.85  CALCIUM 8.5* 8.6*    Studies/Results: Dg Abd 2 Views  Result Date: 05/27/2016 CLINICAL DATA:  Follow-up small bowel obstruction. EXAM: ABDOMEN - 2 VIEW COMPARISON:  05/25/2016 FINDINGS: Nasogastric tube in the left upper abdomen and probably in the stomach. Negative for free air on the upright view. Difficult to exclude patchy densities in the right lower lung. The dilated gas-filled loops of small bowel have resolved. There is a gas and stool in the colon. Stable oval-shaped sclerotic density in the left iliac bone. IMPRESSION: Dilated small bowel loops are no longer identified. Findings are compatible with a resolving or resolved small bowel obstruction. Vague densities at the right lung base and cannot exclude right lung disease. Electronically Signed   By: Markus Daft M.D.   On: 05/27/2016 13:01   Abdominal xray and US/ possible paracentesis ordered now. MD spoke directly with radiology, [place] tech in radiology now and PA expected also now for 2 other scheduled procedures. They will review and add patient if possible.    DISCUSSION Need to be sure partial SBO not worse since NG DCd, but seems likely that the increased abdominal distension is recurrent  malignant ascites. If ascites, likely will need therapeutic paracentesis now (vs emergent in next couple of days if not).  If no significant bowel obstruction and if paracentesis possible, may be able to DC later today or tomorrow. If needs to stay in hospital, will give chemo on 12-26; if DC, she will see this MD + chemo at [facility] on 12-29.  Appreciate help from radiology today if possible.   Patient agrees with flu vaccine She has received information on doxil in Spanish and is in agreement with treatment as above.   Discussed need for good nutrition with protein due to losses in malignant ascites. She is lactose intolerant, with gas; note Ensure Enlive contains milk products. She needs Resource supplement at least 4 daily while on liquid diet, which I prefer she continue until chemo underway.   Assessment/Plan:  1.advanced ovarian carcinoma: Bilateral ovarian by history, adeno likely high grade serous by cytology from malignant ascites 05-22-16, IIIC. Progressive within 4 months of taxol + avastin (last 02-02-16); appears thatlast CDDP was 10-2015 andlast carboplatin was 07-07-15, so may not be technically platinum resistant.. Rapidly symptomatic with malignant ascites and peritoneal carcinomatosis in 2 weeks PTA. Post paracentesis 2.9 liters on 05-22-16. [place] requested this AM, with paracentesis if significant reacumulation of ascites.  When bowel issues allow, expect to begin chemotherapy, either in hospital on 12-26  or at [facility] on 12-29. If DC home, I will see her at [facility] on 12-29. Even with this history, she may get significant improvement/ some period of control of disease from additional systemic treatment; she  appears in very good condition otherwise.  2. Small bowel obstruction: improved with bowel rest as of 12-23, with NG clamped then removed . Follow up xray pending now, but distension may be recurrent malignant ascites.   Would not advance beyond full liquids prior to  chemo, but hopefully will be able to tolerate clear liquid/ full liquid supplements.  3.Chronic constipation x years prior to this illness. 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 Not symptomatic now. 5.inguinal hernias containing only fat 6.aortic atherosclerosis by CT 7.social situation: recently moved from [place] to be with daughters in [place], family very supportive. Speaks Spanish, will need interpreter at office  8. Nutrition: poor po intake beginning 2-3 days prior to admission, and mostly NPO for past week. Likes Resource, is lactose intolerant. Recommended at least 4 Resource daily for now if possible.  9.PAC placed by IR 10. Echocardiogram with good LVF, done for doxil 11.Needs flu vaccine especially with chemo upcoming. To be given in hospital today   Please page me [phone] or cell [phone] if needed. There will be additional outpatient appointments (lab, MD) placed next week, which will be communicated to patient if she is DC.   [place] [employer]

## 2016-05-28 NOTE — Progress Notes (Signed)
PROGRESS NOTE    Audrey Garza  [id number] DOB: Jul 01, 1940 DOA: 05/20/2016 PCP: No primary care provider on file.    Brief Narrative:  75 year old female with a history of metastatic ovarian cancer presented with progressive abdominal bloating and pain for 3 weeks duration. She was feeling well until the first week of December, 2017 when she was sick with a virus from which she never recovered. She states that she began feeling distended in the abdomen and uncomfortable and was taken to the ED on 05/20/16. The patient reports being diagnosed with stage III ovarian cancer in January, 2017. She received primary debulking surgery followed by 8 adjuvant cycles of carboplatin and paclitaxel followed by an additional 4 cycles of what she describes as maintenance carboplatin, paclitaxel and avastin. Her last dose of therapy was in September 2017   CT chest/abd/pelvis9-21-17 reportedly had no evidence of malignancy, with some bullous emphysema, tortuous sigmoid colon with diverticulosis. . Recommendation was for maintenance avastin x 1 year, however patient then came to [facility] in 02-2016. She has not established with any physicians since coming to [place]. CT AP 05-20-16 lias arge volume ascites, extensive peritoneal disease with omental thickening, trace bilateral pleural effusions, left colonic diverticulosis, bilateral inguinal hernias containing fat, post hysterectomy, no adnexal masses, no adenopathy. US paracentesis 05-22-16 for 2.9 liters of yellow fluid. 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 Unfortunately, after paracentesis she began having increasing abdominal pain. Abdominal x-ray revealed distended small bowel loops concerning for small bowel ejection. Gen. surgery was consulted. NG was inserted to suction.    Assessment & Plan:   Active Problems:   Malignant ascites   Malnutrition of moderate degree   Malignant neoplasm of ovary (HCC)   Small bowel  obstruction   Gastroesophageal reflux disease   N&V (nausea and vomiting)   Impaired nasal gastric feeding tube   Metastatic ovarian cancer with extensive peritoneal disease with omental thickening with large ascites: -Family brought in prior medical record, but in [place]. -She still seems to have good performance status -appreciate Drs. Marko Plume and Denman George -. 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  -Per Dr. Denman George--- not a candidate for interval debulking surgery due to the platinum resistant nature of her disease--if she fails advancement of diet, she should be considered for palliative G tube Oncology following.-  Small Bowel Obstruction -05/23/2016 abdominal x-ray multiple dilated loops of bowel concerning for small bowel obstruction -Likely due to the extensive peritoneal carcinomatosis -12/19--NG tube inserted to suction -Patient now passing flatus and had bowel movement 12/19 and 12/20  -12/21 Abd xray--persistent distended loops of sm bowel c/w SBO -Diet advancement per general surgery-->remain NPO for now--feels she  -Appreciate general surgery consult-->unlikely to fix with an operation - No BM today 05/26/2016. NGT clamped per oncology rxcs since last night 05/26/2016. -05/27/2016 patient passing flatus. No bowel movement. Burping. Patient denies any abdominal pain. NG tube has been clamped since last night and patient with no nausea no emesis no abdominal pain. Plain films from 05/27/2016 with findings compatible with a resolving or resolved small bowel obstruction. - NG tube discontinued on 05/27/2016. Patient  maintained on clear liquids.  Patient with complaints of abdominal fullness and no  bowel movement this morning. Abdominal ultrasound done had minimal ascitic fluid to be drained. Plain films of the abdomen done showed some distention of the small bowel consistent with recurrent small bowel obstruction. Will keep patient nothing by mouth for bowel rest. Will hold off on placement of NG tube unless patient becomes symptomatically worse with nausea emesis abdominal pain. Repeat abdominal films in the morning. - Per [facility] note, family have requested not to be seen by Dr.Toth III any longer and he has recommended that if surgery is needed recommend transfer to an academic center. General surgery has signed off. - Per general surgery unlikely obstruction can be fixed with an operation report on a low for some palliative bypass however unlikely to prolong patient's life for significant time. Patient with a poor prognosis. -Follow.  Mild hyponatremia:  -Secondary to volume depletion -Improved with hydration. Place on IV fluids.  Mild thrombocytosis: -likely reactive.    DVT prophylaxis: Lovenox Code Status: Full Family Communication: Updated patient and family at bedside. Disposition Plan: Home when medically stable and passing gas and having bowel movements, and per oncology.   Consultants:   GYN oncology Dr. Denman George 05/23/2016  Gen. surgery Dr.Toth III 05/23/2016  Oncology: Dr. Marko Plume 05/23/2016  Procedures:   CT abdomen and pelvis 05/20/2016  Abdominal x-rays 05/23/2016, 05/24/2016, 05/25/2016, 05/27/2016, 05/28/2016  Right IJ single-lumen power port catheter insertion per Dr. Annamaria Boots 05/24/2016  2-D echo 05/24/2016  Ultrasound-guided paracentesis--2.9 L of clear yellow fluid removed--Dr. Earleen Newport 05/22/2016 cytology positive for adenocarcinoma  Antimicrobials:   IV Ancef 05/24/2016 1 dose.   Subjective: Patient denies nausea. No emesis. Patient passing flatus. No bowel  movement yet. Patient burping during interview. Patient complaining of abdominal pain which she describes as people poking her abdomen. Patient also complained of an abdominal distention/fullness.   Objective: Vitals:   05/27/16 0554 05/27/16 1335 05/27/16 2100 05/28/16 0600  BP: 134/78 113/64 136/82 131/74  Pulse: 78 85 84 82  Resp: 20 16 16 18   Temp: 98.7 F (37.1 C) 98.5 F (36.9 C) 98.2 F (36.8 C) 98.4 F (36.9 C)  TempSrc: Oral Oral Oral Oral  SpO2: 94% 96% 96% 96%  Weight:      Height:        Intake/Output Summary (Last 24 hours) at 05/28/16 1147 Last data filed at 05/27/16 1837  Gross per 24 hour  Intake              840 ml  Output                0 ml  Net              840 ml   Filed Weights   05/20/16 1332 05/20/16 1818  Weight: 78 kg (171 lb 15.3 oz) 80.2 kg (176 lb 12.9 oz)    Examination:  General exam: Appears calm and comfortable.NG tube in place. Respiratory system: Clear to auscultation. Respiratory effort normal. Cardiovascular system: S1 & S2 heard, RRR. No JVD, murmurs, rubs, gallops or clicks. No pedal edema. Gastrointestinal system: Abdomen is soft, nontender to palpation, positive bowel sounds, mildly distended. No organomegaly or masses felt. Central nervous system: Alert and oriented. No focal neurological deficits. Extremities: Symmetric 5 x 5 power. Skin: No rashes, lesions or ulcers Psychiatry: Judgement and insight appear normal. Mood & affect appropriate.     Data Reviewed: I have personally reviewed following labs and imaging studies  CBC:  Recent Labs Lab 05/24/16 0400 05/26/16 0555 05/27/16 1040 05/28/16 1007  WBC 8.0 6.9 8.0 7.0  NEUTROABS 5.1  --  5.1 4.7  HGB 12.4 11.6* 12.5 11.5*  HCT 37.5 34.9* 37.9 34.6*  MCV 89.7 87.3 87.9 87.2  PLT 499* 407* 498* [id number]*   Basic Metabolic Panel:  Recent Labs Lab 05/24/16 0400 05/25/16 0407 05/26/16 0555 05/27/16 1040 05/28/16 1007  NA 138 141 137 136 134*  K 4.3 3.7 3.7 3.7  3.3*  CL 100* 105 101 100* 100*  CO2 29 27 27 27 25   GLUCOSE 113* 109* 96 131* 147*  BUN 21* 19 15 13 9   CREATININE 0.90 0.84 0.69 0.85 0.77  CALCIUM 8.7* 8.5* 8.5* 8.6* 8.0*  MG 2.3  --   --  2.0 1.8   GFR: Estimated Creatinine Clearance: 60.9 mL/min (by C-G formula based on SCr of 0.77 mg/dL). Liver Function Tests: No results for input(s): AST, ALT, ALKPHOS, BILITOT, PROT, ALBUMIN in the last 168 hours. No results for input(s): LIPASE, AMYLASE in the last 168 hours. No results for input(s): AMMONIA in the last 168 hours. Coagulation Profile: No results for input(s): INR, PROTIME in the last 168 hours. Cardiac Enzymes: No results for input(s): CKTOTAL, CKMB, CKMBINDEX, TROPONINI in the last 168 hours. BNP (last 3 results) No results for input(s): PROBNP in the last 8760 hours. HbA1C: No results for input(s): HGBA1C in the last 72 hours. CBG: No results for input(s): GLUCAP in the last 168 hours. Lipid Profile: No results for input(s): CHOL, HDL, LDLCALC, TRIG, CHOLHDL, LDLDIRECT in the last 72 hours. Thyroid Function Tests: No results for input(s): TSH, T4TOTAL, FREET4, T3FREE, THYROIDAB in the last 72 hours. Anemia Panel: No results for input(s): VITAMINB12, FOLATE, FERRITIN, TIBC, IRON, RETICCTPCT in the last 72 hours. Sepsis Labs: No results for input(s): PROCALCITON, LATICACIDVEN in the last 168 hours.  Recent Results (from the past 240 hour(s))  Culture, body fluid-bottle     Status: None   Collection Time: 05/22/16 11:50 AM  Result Value Ref Range Status   Specimen Description FLUID  Final   Special Requests ASCTIES  Final   Culture   Final    NO GROWTH 5 DAYS Performed at [facility]    Report Status 05/27/2016 FINAL  Final  Gram stain     Status: None   Collection Time: 05/22/16 11:50 AM  Result Value Ref Range Status   Specimen Description FLUID  Final   Special Requests ASCTIES  Final   Gram Stain   Final    FEW WBC PRESENT,BOTH PMN AND  MONONUCLEAR NO ORGANISMS SEEN Performed at [facility]    Report Status 05/22/2016 FINAL  Final  Urine culture     Status: Abnormal   Collection Time: 05/25/16 10:48 PM  Result Value Ref Range Status   Specimen Description URINE, CLEAN CATCH  Final   Special Requests NONE  Final   Culture (A)  Final    <10,000 COLONIES/mL INSIGNIFICANT GROWTH Performed at [facility]    Report Status 05/27/2016 FINAL  Final         Radiology Studies: Dg Abd 1 View  Result Date: 05/28/2016 CLINICAL DATA:  Increased abdominal distention and patient with recent small bowel obstruction. Status post NG tube removal. EXAM: ABDOMEN - 1 VIEW COMPARISON:  Two views of the abdomen 05/27/2016. FINDINGS: NG tube has been removed. There is new dilatation of small bowel loops up to 4.9 cm. No evidence of free air on single view of the abdomen. IMPRESSION: New gaseous distention of small bowel  worrisome for recurrent obstruction. Electronically Signed   By: Inge Rise M.D.   On: 05/28/2016 10:36   US Abdomen Limited  Result Date: 05/28/2016 CLINICAL DATA:  History of abdominal carcinomatosis and ascites due to ovarian carcinoma. Evaluate for paracentesis. EXAM: LIMITED ABDOMEN ULTRASOUND FOR ASCITES TECHNIQUE: Limited ultrasound survey for ascites was performed in all four abdominal quadrants. COMPARISON:  CT abdomen and pelvis 05/20/2016. FINDINGS: Small volume of abdominal and pelvic ascites is identified and inadequate for paracentesis. IMPRESSION: As above. Electronically Signed   By: Inge Rise M.D.   On: 05/28/2016 11:12   Dg Abd 2 Views  Result Date: 05/27/2016 CLINICAL DATA:  Follow-up small bowel obstruction. EXAM: ABDOMEN - 2 VIEW COMPARISON:  05/25/2016 FINDINGS: Nasogastric tube in the left upper abdomen and probably in the stomach. Negative for free air on the upright view. Difficult to exclude patchy densities in the right lower lung. The dilated gas-filled loops of small  bowel have resolved. There is a gas and stool in the colon. Stable oval-shaped sclerotic density in the left iliac bone. IMPRESSION: Dilated small bowel loops are no longer identified. Findings are compatible with a resolving or resolved small bowel obstruction. Vague densities at the right lung base and cannot exclude right lung disease. Electronically Signed   By: Markus Daft M.D.   On: 05/27/2016 13:01        Scheduled Meds: . enoxaparin (LOVENOX) injection  40 mg Subcutaneous Q24H  . feeding supplement  1 Container Oral QID  . magnesium sulfate 1 - 4 g bolus IVPB  4 g Intravenous Once  . pantoprazole (PROTONIX) IV  40 mg Intravenous Q24H  . potassium chloride  10 mEq Intravenous Q1 Hr x 3  . senna-docusate  1 tablet Oral BID  . zolpidem  5 mg Oral Once   Continuous Infusions: . dextrose 5 % and 0.9% NaCl 1,000 mL infusion       LOS: 7 days    Time spent: 40 mins   THOMPSON,DANIEL, MD [facility] Pager [phone]  If 7PM-7AM, please contact night-coverage [email or web address] Password [facility] 05/28/2016, 11:47 AM

## 2016-05-29 ENCOUNTER — Inpatient Hospital Stay (HOSPITAL_COMMUNITY): Payer: Medicaid Other

## 2016-05-29 ENCOUNTER — Other Ambulatory Visit: Payer: Self-pay | Admitting: Oncology

## 2016-05-29 LAB — BASIC METABOLIC PANEL
Anion gap: 8 (ref 5–15)
BUN: 6 mg/dL (ref 6–20)
CHLORIDE: 105 mmol/L (ref 101–111)
CO2: 25 mmol/L (ref 22–32)
Calcium: 8.3 mg/dL — ABNORMAL LOW (ref 8.9–10.3)
Creatinine, Ser: 0.74 mg/dL (ref 0.44–1.00)
GFR calc non Af Amer: >60 mL/min (ref >60)
Glucose, Bld: 136 mg/dL — ABNORMAL HIGH (ref 65–99)
POTASSIUM: 3.8 mmol/L (ref 3.5–5.1)
SODIUM: 138 mmol/L (ref 135–145)

## 2016-05-29 LAB — CBC
HCT: 38.1 % (ref 36.0–46.0)
HEMOGLOBIN: 12.7 g/dL (ref 12.0–15.0)
MCH: 29 pg (ref 26.0–34.0)
MCHC: 33.3 g/dL (ref 30.0–36.0)
MCV: 87 fL (ref 78.0–100.0)
Platelets: 458 10*3/uL — ABNORMAL HIGH (ref 150–400)
RBC: 4.38 MIL/uL (ref 3.87–5.11)
RDW: 12.7 % (ref 11.5–15.5)
WBC: 9.6 10*3/uL (ref 4.0–10.5)

## 2016-05-29 LAB — MAGNESIUM: MAGNESIUM: 2.4 mg/dL (ref 1.7–2.4)

## 2016-05-29 MED ORDER — BUTAMBEN-TETRACAINE-BENZOCAINE 2-2-14 % EX AERO
1.0000 | INHALATION_SPRAY | CUTANEOUS | Status: DC | PRN
  Filled 2016-05-29: qty 20

## 2016-05-29 MED ORDER — POTASSIUM CHLORIDE 10 MEQ/100ML IV SOLN
10.0000 meq | INTRAVENOUS | Status: DC
  Administered 2016-05-29: 10 meq via INTRAVENOUS
  Filled 2016-05-29 (×5): qty 100

## 2016-05-29 MED ORDER — POTASSIUM CHLORIDE 10 MEQ/100ML IV SOLN
10.0000 meq | INTRAVENOUS | Status: AC
  Administered 2016-05-29 (×2): 10 meq via INTRAVENOUS
  Filled 2016-05-29 (×3): qty 100

## 2016-05-29 MED ORDER — MAGNESIUM SULFATE 50 % IJ SOLN
3.0000 g | Freq: Once | INTRAVENOUS | Status: DC
  Administered 2016-05-29: 3 g via INTRAVENOUS
  Filled 2016-05-29: qty 6

## 2016-05-29 NOTE — Progress Notes (Signed)
PROGRESS NOTE    Audrey Garza  [id number] DOB: December 04, 1940 DOA: 05/20/2016 PCP: No primary care provider on file.    Brief Narrative:  75 year old female with a history of metastatic ovarian cancer presented with progressive abdominal bloating and pain for 3 weeks duration. She was feeling well until the first week of December, 2017 when she was sick with a virus from which she never recovered. She states that she began feeling distended in the abdomen and uncomfortable and was taken to the ED on 05/20/16. The patient reports being diagnosed with stage III ovarian cancer in January, 2017. She received primary debulking surgery followed by 8 adjuvant cycles of carboplatin and paclitaxel followed by an additional 4 cycles of what she describes as maintenance carboplatin, paclitaxel and avastin. Her last dose of therapy was in September 2017   CT chest/abd/pelvis9-21-17 reportedly had no evidence of malignancy, with some bullous emphysema, tortuous sigmoid colon with diverticulosis. . Recommendation was for maintenance avastin x 1 year, however patient then came to [facility] in 02-2016. She has not established with any physicians since coming to [place]. CT AP 05-20-16 lias arge volume ascites, extensive peritoneal disease with omental thickening, trace bilateral pleural effusions, left colonic diverticulosis, bilateral inguinal hernias containing fat, post hysterectomy, no adnexal masses, no adenopathy. US paracentesis 05-22-16 for 2.9 liters of yellow fluid. 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 Unfortunately, after paracentesis she began having increasing abdominal pain. Abdominal x-ray revealed distended small bowel loops concerning for small bowel ejection. Gen. surgery was consulted. NG was inserted to suction.    Assessment & Plan:   Active Problems:   Malignant ascites   Malnutrition of moderate degree   Malignant neoplasm of ovary (HCC)   Small bowel  obstruction   Gastroesophageal reflux disease   N&V (nausea and vomiting)   Impaired nasal gastric feeding tube   Ileus (HCC)   Metastatic ovarian cancer with extensive peritoneal disease with omental thickening with large ascites: -Family brought in prior medical record, but in [place]. -She still seems to have good performance status -appreciate Drs. Marko Plume and Denman George -. 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  -Per Dr. Denman George--- not a candidate for interval debulking surgery due to the platinum resistant nature of her disease--if she fails advancement of diet, she should be considered for palliative G tube Oncology following.- Per oncology if patient stable planning on starting chemotherapy tomorrow 05/30/2016.  Small Bowel Obstruction -05/23/2016 abdominal x-ray multiple dilated loops of bowel concerning for small bowel obstruction -Likely due to the extensive peritoneal carcinomatosis -12/19--NG tube inserted to suction -Patient now passing flatus and had bowel movement 12/19 and 12/20  -12/21 Abd xray--persistent distended loops of sm bowel c/w SBO -Diet advancement per general surgery-->remain NPO for now--feels she  -Appreciate general surgery consult-->unlikely to fix with an operation - No BM today 05/26/2016. NGT clamped per oncology rxcs since last night 05/26/2016. -05/27/2016 patient passing flatus. No bowel movement. Burping. Patient denies any abdominal pain. NG tube has been clamped since last night and patient with no nausea no emesis no abdominal pain. Plain films from 05/27/2016 with findings compatible  with a resolving or resolved small bowel obstruction. - NG tube discontinued on  05/27/2016. Patient maintained on clear liquids.  Patient with complaints of abdominal fullness and no bowel movement the morning . Abdominal ultrasound done had minimal ascitic fluid to be drained. Plain films of the abdomen done showed some distention of the small bowel consistent with recurrent small bowel obstruction. Patient nothing by mouth for bowel rest. Plain films done today 05/29/2016 with findings compatible with small bowel obstruction with slight interval worsening with bowel loops measuring 6.8 cm in diameter. Patient with some complaints of abdominal fullness. Patient however had 2 bowel movements yesterday. Patient denies any emesis. NG tube has been reordered per oncology to be placed. Repeat abdominal films in the morning. Patient if stable is to be started on chemotherapy per oncology tomorrow 05/30/2016. Replete electrolytes. Keep potassium greater than 4. Keep magnesium at 2.   - Per [facility] note, family have requested not to be seen by Dr.Toth III any longer and he has recommended that if surgery is needed recommend transfer to an academic center. General surgery has signed off. - Per general surgery abdominal films from 05/24/2016 with findings compatible with patient's known small bowel obstruction and interval worsening of small bowel obstruction. unlikely obstruction can be fixed with an operation report on a low for some palliative bypass however unlikely to prolong patient's life for significant time. Patient with a poor prognosis. -Follow.  Mild hyponatremia:  -Secondary to volume depletion -Improved with hydration. Place on IV fluids as patient NPO..  Mild thrombocytosis: -likely reactive.    DVT prophylaxis: Lovenox Code Status: Full Family Communication: Updated patient and family at bedside. Disposition Plan: Home when medically stable and passing gas and having bowel movements,  and per oncology.   Consultants:   GYN oncology Dr. Denman George 05/23/2016  Gen. surgery Dr.Toth III 05/23/2016  Oncology: Dr. Marko Plume 05/23/2016  Procedures:   CT abdomen and pelvis 05/20/2016  Abdominal x-rays 05/23/2016, 05/24/2016, 05/25/2016, 05/27/2016, 05/28/2016, 05/30/2016, 05/28/2016, 05/29/2016  Right IJ single-lumen power port catheter insertion per Dr. Annamaria Boots 05/24/2016  2-D echo 05/24/2016  Ultrasound-guided paracentesis--2.9 L of clear yellow fluid removed--Dr. Earleen Newport 05/22/2016 cytology positive for adenocarcinoma  Antimicrobials:   IV Ancef 05/24/2016 1 dose.   Subjective: Patient denies nausea. No emesis. Patient passing flatus. Patient had 2 bowel movements yesterday. Patient with some complaints of abdominal fullness some distention. Patient belching.   Objective: Vitals:   05/28/16 0600 05/28/16 1355 05/28/16 2050 05/29/16 0655  BP: 131/74 137/80 132/78 138/76  Pulse: 82 83 77 78  Resp: 18 18 18 16   Temp: 98.4 F (36.9 C) 98.2 F (36.8 C) 97.9 F (36.6 C) 98 F (36.7 C)  TempSrc: Oral Oral Oral Oral  SpO2: 96% 97% 98% 96%  Weight:      Height:        Intake/Output Summary (Last 24 hours) at 05/29/16 1131 Last data filed at 05/28/16 1501  Gross per 24 hour  Intake           356.67 ml  Output                0 ml  Net           356.67 ml   Filed Weights   05/20/16 1332 05/20/16 1818  Weight: 78 kg (171 lb 15.3 oz) 80.2 kg (176 lb 12.9 oz)    Examination:  General exam: Appears calm and comfortable.NG tube in place. Respiratory system: Clear to auscultation. Respiratory effort normal. Cardiovascular system: S1 & S2 heard, RRR. No JVD, murmurs, rubs, gallops or clicks.  No pedal edema. Gastrointestinal system: Abdomen is soft, nontender to palpation, positive bowel sounds, mildly distended. No organomegaly or masses felt. Central nervous system: Alert and oriented. No focal neurological deficits. Extremities: Symmetric 5 x 5 power. Skin: No  rashes, lesions or ulcers Psychiatry: Judgement and insight appear normal. Mood & affect appropriate.     Data Reviewed: I have personally reviewed following labs and imaging studies  CBC:  Recent Labs Lab 05/24/16 0400 05/26/16 0555 05/27/16 1040 05/28/16 1007 05/29/16 1009  WBC 8.0 6.9 8.0 7.0 9.6  NEUTROABS 5.1  --  5.1 4.7  --   HGB 12.4 11.6* 12.5 11.5* 12.7  HCT 37.5 34.9* 37.9 34.6* 38.1  MCV 89.7 87.3 87.9 87.2 87.0  PLT 499* 407* 498* 429* [id number]*   Basic Metabolic Panel:  Recent Labs Lab 05/24/16 0400 05/25/16 0407 05/26/16 0555 05/27/16 1040 05/28/16 1007 05/29/16 1009  NA 138 141 137 136 134* 138  K 4.3 3.7 3.7 3.7 3.3* 3.8  CL 100* 105 101 100* 100* 105  CO2 29 27 27 27 25 25   GLUCOSE 113* 109* 96 131* 147* 136*  BUN 21* 19 15 13 9 6   CREATININE 0.90 0.84 0.69 0.85 0.77 0.74  CALCIUM 8.7* 8.5* 8.5* 8.6* 8.0* 8.3*  MG 2.3  --   --  2.0 1.8 2.4   GFR: Estimated Creatinine Clearance: 60.9 mL/min (by C-G formula based on SCr of 0.74 mg/dL). Liver Function Tests: No results for input(s): AST, ALT, ALKPHOS, BILITOT, PROT, ALBUMIN in the last 168 hours. No results for input(s): LIPASE, AMYLASE in the last 168 hours. No results for input(s): AMMONIA in the last 168 hours. Coagulation Profile: No results for input(s): INR, PROTIME in the last 168 hours. Cardiac Enzymes: No results for input(s): CKTOTAL, CKMB, CKMBINDEX, TROPONINI in the last 168 hours. BNP (last 3 results) No results for input(s): PROBNP in the last 8760 hours. HbA1C: No results for input(s): HGBA1C in the last 72 hours. CBG: No results for input(s): GLUCAP in the last 168 hours. Lipid Profile: No results for input(s): CHOL, HDL, LDLCALC, TRIG, CHOLHDL, LDLDIRECT in the last 72 hours. Thyroid Function Tests: No results for input(s): TSH, T4TOTAL, FREET4, T3FREE, THYROIDAB in the last 72 hours. Anemia Panel: No results for input(s): VITAMINB12, FOLATE, FERRITIN, TIBC, IRON, RETICCTPCT  in the last 72 hours. Sepsis Labs: No results for input(s): PROCALCITON, LATICACIDVEN in the last 168 hours.  Recent Results (from the past 240 hour(s))  Culture, body fluid-bottle     Status: None   Collection Time: 05/22/16 11:50 AM  Result Value Ref Range Status   Specimen Description FLUID  Final   Special Requests ASCTIES  Final   Culture   Final    NO GROWTH 5 DAYS Performed at [facility]    Report Status 05/27/2016 FINAL  Final  Gram stain     Status: None   Collection Time: 05/22/16 11:50 AM  Result Value Ref Range Status   Specimen Description FLUID  Final   Special Requests ASCTIES  Final   Gram Stain   Final    FEW WBC PRESENT,BOTH PMN AND MONONUCLEAR NO ORGANISMS SEEN Performed at [facility]    Report Status 05/22/2016 FINAL  Final  Urine culture     Status: Abnormal   Collection Time: 05/25/16 10:48 PM  Result Value Ref Range Status   Specimen Description URINE, CLEAN CATCH  Final   Special Requests NONE  Final   Culture (A)  Final    <  10,000 COLONIES/mL INSIGNIFICANT GROWTH Performed at [facility]    Report Status 05/27/2016 FINAL  Final         Radiology Studies: Dg Abd 1 View  Result Date: 05/28/2016 CLINICAL DATA:  Increased abdominal distention and patient with recent small bowel obstruction. Status post NG tube removal. EXAM: ABDOMEN - 1 VIEW COMPARISON:  Two views of the abdomen 05/27/2016. FINDINGS: NG tube has been removed. There is new dilatation of small bowel loops up to 4.9 cm. No evidence of free air on single view of the abdomen. IMPRESSION: New gaseous distention of small bowel worrisome for recurrent obstruction. Electronically Signed   By: Inge Rise M.D.   On: 05/28/2016 10:36   US Abdomen Limited  Result Date: 05/28/2016 CLINICAL DATA:  History of abdominal carcinomatosis and ascites due to ovarian carcinoma. Evaluate for paracentesis. EXAM: LIMITED ABDOMEN ULTRASOUND FOR ASCITES TECHNIQUE: Limited  ultrasound survey for ascites was performed in all four abdominal quadrants. COMPARISON:  CT abdomen and pelvis 05/20/2016. FINDINGS: Small volume of abdominal and pelvic ascites is identified and inadequate for paracentesis. IMPRESSION: As above. Electronically Signed   By: Inge Rise M.D.   On: 05/28/2016 11:12   Dg Abd 2 Views  Result Date: 05/29/2016 CLINICAL DATA:  History of small bowel obstruction and abdominal distension. Diagnosed with ovarian cancer 1 year ago EXAM: ABDOMEN - 2 VIEW COMPARISON:  05/28/2016 and CT 05/20/2016 FINDINGS: Exam demonstrates multiple air-filled dilated small bowel loops measuring 6.8 cm in diameter. Several air-fluid levels on the upright film. Findings are slightly worse. No free peritoneal air. Minimal air within the colon. Mild degenerate change of the spine and hips. 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 Electronically Signed   By: Marin Olp M.D.   On: 05/29/2016 09:38        Scheduled Meds: . enoxaparin (LOVENOX) injection  40 mg Subcutaneous Q24H  . feeding supplement  1 Container Oral QID  . magnesium sulfate 1 - 4 g bolus IVPB  3 g Intravenous Once  . pantoprazole (PROTONIX) IV  40 mg Intravenous Q24H  . potassium chloride  10 mEq Intravenous Q1 Hr x 5  . zolpidem  5 mg Oral Once   Continuous Infusions: . dextrose 5 % and 0.9% NaCl 100 mL/hr at 05/29/16 0454     LOS: 8 days    Time spent: 86 mins   THOMPSON,DANIEL, MD [facility] Pager [phone]  If 7PM-7AM, please contact night-coverage [email or web address] Password [facility] 05/29/2016, 11:31 AM

## 2016-05-29 NOTE — Progress Notes (Signed)
RN saw results for abd xray stating tube curved upon itself in distal esophagus and needs repositioning. RN pulled tube back approximately 2-3 inches and re-advanced.  Patient tolerated procedure well and placement reconfirmed with ausculation. MD notified of results and actions taken and follow-up xray reordered to reconfirm tube repositioned into proper location.

## 2016-05-29 NOTE — Progress Notes (Signed)
MEDICAL ONCOLOGY May 29, 2016, 10:25 AM  Hospital day 10 Antibiotics: none Chemotherapy:  last in [place] 02-02-16. Last platinum either May 2017 or early summer 2017. Last avastin at least 01-3016.  [place] 12-24 no significant ascites; xray with recurrent small bowel obstruction. Made NPO, electrolytes supplemented and IVF begun then. Abd xray this AM shows dilated small bowel loops now up to 6.8 cm diameter. No free air, minimal air in colon.   Patient seen with daughter here. Discussed with unit RN at bedside and separately.  Subjective: More abdominal distension with some pain as she tries to walk, belching and vomited po med this AM. Had 2 BMs last night and is passing flatus. Reports increased pain with bowel movements. No SOB, no problems with PAC, up to void x 3 thru night (with IVF overnight at 100). No bleeding.   PAC by IR Flu vaccine 05-28-16  Oncologic History Patient had hysterectomy without oophorectomy in her 30s for benign indication, and been in her usual good health until July 2016, when she developed abdominal discomfort and bloating. In late 03-2015 she was found to have large pelvic mass and perihepatic ascites. CA 125 then was 359, with CEA of 3, CA 19-9 5.8 and alpha fetoprotein 2.3. She had exploratory laparotomy 04-19-15, which was not done by gyn oncologist, with bilateral ovaried an tumors, 10 cm with capsule ruptured on right , smaller on left, peritoneal carcinomatosis. Washings were done, however accompanying information does not describe extent of debulking. She had some bowel obstruction post operatively, with NG used ~ POD 3. Pathology available 05-05-15 showed cystic and solid left ovarian mass, 13 cm right ovarian mass, moderately differentiated tumor "tumor de celulas de la granulosa". 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 CT AP 122016 had ascites, carcinomatosis and multiple  peritoneal implants. CA 125 on 05-24-15 baseline for chemo was 141. She received 3 cycles of taxol + carboplatin from 05-26-15 thru 07-07-15 (not clear if avastin given those cycles), after which carboplatin was not available in [place]. She received taxol 175 mg/m2, CDDP and avastin for 3 additional cycles thru 10-07-15. Second look laparotomy was discussed, patient declined, and instead had 4 cycles avastin + taxol from 12-01-15 thru 02-02-16 (family is confirming, but we believe no CDDP after 10-07-15 and no carboplatin after 07-07-15). CT CAP 02-24-16 reportedly had no evidence of malignancy, with some bullous emphysema, tortuous sigmoid colon with diverticulosis. . Recommendation was for maintenance avastin x 1 year, however patient then came to [facility] in 02-2016. She has not established with any physicians since coming to [place]. CT AP 05-20-16 lias arge volume ascites, extensive peritoneal disease with omental thickening, trace bilateral pleural effusions, left colonic diverticulosis, bilateral inguinal hernias containing fat, post hysterectomy, no adnexal masses, no adenopathy. US paracentesis 05-22-16 for 2.9 liters of yellow fluid. Cytology adenocarcinoma withserous features, immunohistochemical stains confirm gyn.  Objective: Vital signs in last 24 hours: Blood pressure 138/76, pulse 78, temperature 98 F (36.7 C), temperature source Oral, resp. rate 16, height 5\' 3"  (1.6 m), weight 176 lb 12.9 oz (80.2 kg), SpO2 96 %. Awake, alert, talks easily with daughter. Tolerated walking only short distance in hall just now, stopped with bilateral lower abdominal pain which improved when she lay supine. Looks uncomfortable and is tearful now. Oral mucosa moist. PAC locked, site ok. Lungs clear anteriorly, respirations not labored. Heart RRR. Abdomen more distended thruout, not tight, some BS in lower quadrants and more obstructive  type sounds in mid abdomen. Not tender to gentle palpation.  LE no edema, cords, tenderness.  Moves all extremities easily.   Intake/Output from previous day: 12/24 0701 - 12/25 0700 In: 596.7 [P.O.:240; I.V.:156.7; IV Piggyback:200] Out: -  Intake/Output this shift: No intake/output data recorded.   Lab Results:  Labs today being drawn now  Recent Labs  05/27/16 1040 05/28/16 1007  WBC 8.0 7.0  HGB 12.5 11.5*  HCT 37.9 34.6*  PLT 498* 429*   BMET  Recent Labs  05/27/16 1040 05/28/16 1007  NA 136 134*  K 3.7 3.3*  CL 100* 100*  CO2 27 25  GLUCOSE 131* 147*  BUN 13 9  CREATININE 0.85 0.77  CALCIUM 8.6* 8.0*    Studies/Results: Dg Abd 1 View  Result Date: 05/28/2016 CLINICAL DATA:  Increased abdominal distention and patient with recent small bowel obstruction. Status post NG tube removal. EXAM: ABDOMEN - 1 VIEW COMPARISON:  Two views of the abdomen 05/27/2016. FINDINGS: NG tube has been removed. There is new dilatation of small bowel loops up to 4.9 cm. No evidence of free air on single view of the abdomen. IMPRESSION: New gaseous distention of small bowel worrisome for recurrent obstruction. Electronically Signed   By: Inge Rise M.D.   On: 05/28/2016 10:36   US Abdomen Limited  Result Date: 05/28/2016 CLINICAL DATA:  History of abdominal carcinomatosis and ascites due to ovarian carcinoma. Evaluate for paracentesis. EXAM: LIMITED ABDOMEN ULTRASOUND FOR ASCITES TECHNIQUE: Limited ultrasound survey for ascites was performed in all four abdominal quadrants. COMPARISON:  CT abdomen and pelvis 05/20/2016. FINDINGS: Small volume of abdominal and pelvic ascites is identified and inadequate for paracentesis. IMPRESSION: As above. Electronically Signed   By: Inge Rise M.D.   On: 05/28/2016 11:12   Dg Abd 2 Views  Result Date: 05/29/2016 CLINICAL DATA:  History of small bowel obstruction and abdominal distension. Diagnosed with ovarian cancer 1 year ago EXAM: ABDOMEN - 2 VIEW COMPARISON:  05/28/2016 and CT 05/20/2016 FINDINGS: Exam demonstrates multiple  air-filled dilated small bowel loops measuring 6.8 cm in diameter. Several air-fluid levels on the upright film. Findings are slightly worse. No free peritoneal air. Minimal air within the colon. Mild degenerate change of the spine and hips. 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 Electronically Signed   By: Marin Olp M.D.   On: 05/29/2016 09:38   Dg Abd 2 Views  Result Date: 05/27/2016 CLINICAL DATA:  Follow-up small bowel obstruction. EXAM: ABDOMEN - 2 VIEW COMPARISON:  05/25/2016 FINDINGS: Nasogastric tube in the left upper abdomen and probably in the stomach. Negative for free air on the upright view. Difficult to exclude patchy densities in the right lower lung. The dilated gas-filled loops of small bowel have resolved. There is a gas and stool in the colon. Stable oval-shaped sclerotic density in the left iliac bone. IMPRESSION: Dilated small bowel loops are no longer identified. Findings are compatible with a resolving or resolved small bowel obstruction. Vague densities at the right lung base and cannot exclude right lung disease. Electronically Signed   By: Markus Daft M.D.   On: 05/27/2016 13:01     Assessment/Plan:  1.advanced ovarian carcinoma: Bilateral ovarian by history, adeno likely high grade serous by cytology from malignant ascites 05-22-16, IIIC. Progressive within 4 months of taxol + avastin (last 02-02-16); appears thatlast CDDP was 10-2015 andlast carboplatin was 07-07-15, so may not  be technically platinum resistant.. Rapidly symptomatic with malignant ascites and peritoneal carcinomatosis in 2 weeks PTA. Post paracentesis 2.9 liters on 05-22-16.  If stable tomorrow, expect to begin chemotherapy with doxil on 12-26. This agent should not cause significant nausea/ vomiting and generally cytopenias are not marked, usually occur >2  weeks after treatment, tho any improvement in carcinomatosis likely not rapid.  Even with this history, she may get significant improvement/ some period of control of disease from additional systemic treatment; she  appears in very good condition otherwise.  2. Small bowel obstruction: improved with bowel rest as of 12-23, with NG clamped then removed, but has reoccurred including marked distension of small bowel loops. Patient agrees to NG, unit RN to place using cetacaine. DCd po laxative. Labs pending, Dr Grandville Silos supplemented K and Mg yesterday 3.NOTE marked diverticulosis by CT, without evidence diverticulitis then. ? If this is contributing to bowel obstruction also 4.small bilateral pleural effusions by CXR likely related to recurrent gyn cancer. Not symptomatic now. 5.inguinal hernias containing only fat 6.aortic atherosclerosis by CT 7.social situation: recently moved from [place] to be with daughters in [place], family very supportive. Speaks Spanish, will need interpreter at office  8. Nutrition: poor po intake beginning 2-3 days prior to admission, and mostly NPO for past week. Likes Resource, is lactose intolerant. Recommended at least 4 Resource daily for now if possible.  9.PAC placed by IR 10. Echocardiogram with good LVF, done for doxil 11. flu vaccine done 05-28-16  I will follow up on 12-26.  Appreciate excellent care by Dr Grandville Silos and hospitalist service.  Evlyn Clines MD

## 2016-05-29 NOTE — Progress Notes (Signed)
NG tube placed per MD order, patient tolerated procedure well.  Placement verified via auscultation and order placed for 1 view xray at bedside to confirm.

## 2016-05-30 ENCOUNTER — Telehealth: Payer: Self-pay

## 2016-05-30 ENCOUNTER — Inpatient Hospital Stay (HOSPITAL_COMMUNITY): Payer: Medicaid Other

## 2016-05-30 ENCOUNTER — Other Ambulatory Visit: Payer: Self-pay | Admitting: Oncology

## 2016-05-30 LAB — CBC WITH DIFFERENTIAL/PLATELET
BASOS ABS: 0 10*3/uL (ref 0.0–0.1)
Basophils Relative: 0 %
Eosinophils Absolute: 0.1 10*3/uL (ref 0.0–0.7)
Eosinophils Relative: 1 %
HEMATOCRIT: 37.5 % (ref 36.0–46.0)
Hemoglobin: 12.6 g/dL (ref 12.0–15.0)
LYMPHS ABS: 1.6 10*3/uL (ref 0.7–4.0)
LYMPHS PCT: 16 %
MCH: 29.5 pg (ref 26.0–34.0)
MCHC: 33.6 g/dL (ref 30.0–36.0)
MCV: 87.8 fL (ref 78.0–100.0)
MONO ABS: 1 10*3/uL (ref 0.1–1.0)
Monocytes Relative: 10 %
NEUTROS ABS: 7.3 10*3/uL (ref 1.7–7.7)
Neutrophils Relative %: 73 %
Platelets: 445 10*3/uL — ABNORMAL HIGH (ref 150–400)
RBC: 4.27 MIL/uL (ref 3.87–5.11)
RDW: 13 % (ref 11.5–15.5)
WBC: 10.1 10*3/uL (ref 4.0–10.5)

## 2016-05-30 LAB — COMPREHENSIVE METABOLIC PANEL
ALBUMIN: 2.8 g/dL — AB (ref 3.5–5.0)
ALT: 10 U/L — ABNORMAL LOW (ref 14–54)
ANION GAP: 6 (ref 5–15)
AST: 13 U/L — ABNORMAL LOW (ref 15–41)
Alkaline Phosphatase: 62 U/L (ref 38–126)
BILIRUBIN TOTAL: 0.3 mg/dL (ref 0.3–1.2)
BUN: 6 mg/dL (ref 6–20)
CHLORIDE: 106 mmol/L (ref 101–111)
CO2: 25 mmol/L (ref 22–32)
Calcium: 8.1 mg/dL — ABNORMAL LOW (ref 8.9–10.3)
Creatinine, Ser: 0.76 mg/dL (ref 0.44–1.00)
GFR calc Af Amer: >60 mL/min (ref >60)
GFR calc non Af Amer: >60 mL/min (ref >60)
GLUCOSE: 134 mg/dL — AB (ref 65–99)
POTASSIUM: 3.9 mmol/L (ref 3.5–5.1)
SODIUM: 137 mmol/L (ref 135–145)
Total Protein: 5.6 g/dL — ABNORMAL LOW (ref 6.5–8.1)

## 2016-05-30 MED ORDER — COLD PACK MISC ONCOLOGY
1.0000 | Freq: Once | Status: AC | PRN
  Filled 2016-05-30: qty 1

## 2016-05-30 MED ORDER — ONDANSETRON HCL 4 MG/2ML IJ SOLN: 4.0000 mg | Freq: Once | INTRAMUSCULAR | Status: DC

## 2016-05-30 MED ORDER — HEPARIN SOD (PORK) LOCK FLUSH 100 UNIT/ML IV SOLN: 250.0000 [IU] | Freq: Once | INTRAVENOUS | Status: DC | PRN

## 2016-05-30 MED ORDER — ONDANSETRON HCL 40 MG/20ML IJ SOLN
8.0000 mg | Freq: Once | INTRAMUSCULAR | Status: AC
  Administered 2016-05-30: 8 mg via INTRAVENOUS
  Filled 2016-05-30: qty 4

## 2016-05-30 MED ORDER — DEXTROSE 5 % IV SOLN
Freq: Once | INTRAVENOUS | Status: AC
  Administered 2016-05-30: 12:00:00 via INTRAVENOUS

## 2016-05-30 MED ORDER — ALTEPLASE 2 MG IJ SOLR
2.0000 mg | Freq: Once | INTRAMUSCULAR | Status: DC | PRN
  Filled 2016-05-30: qty 2

## 2016-05-30 MED ORDER — ONDANSETRON 8 MG PO TBDP: 8.0000 mg | ORAL_TABLET | Freq: Three times a day (TID) | ORAL | 0 refills | Status: DC | PRN

## 2016-05-30 MED ORDER — SODIUM CHLORIDE 0.9 % IV SOLN
Freq: Once | INTRAVENOUS | Status: DC
  Filled 2016-05-30: qty 4

## 2016-05-30 MED ORDER — DOXORUBICIN HCL LIPOSOMAL CHEMO INJECTION 2 MG/ML
70.0000 mg | Freq: Once | INTRAVENOUS | Status: AC
  Administered 2016-05-30: 70 mg via INTRAVENOUS
  Filled 2016-05-30: qty 25

## 2016-05-30 MED ORDER — SODIUM CHLORIDE 0.9% FLUSH: 3.0000 mL | INTRAVENOUS | Status: DC | PRN

## 2016-05-30 MED ORDER — ZOLPIDEM TARTRATE 5 MG PO TABS
5.0000 mg | ORAL_TABLET | Freq: Every evening | ORAL | Status: DC | PRN
  Administered 2016-05-30 – 2016-06-10 (×6): 5 mg via ORAL
  Filled 2016-05-30 (×7): qty 1

## 2016-05-30 MED ORDER — SODIUM CHLORIDE 0.9% FLUSH: 10.0000 mL | INTRAVENOUS | Status: DC | PRN

## 2016-05-30 MED ORDER — LIDOCAINE-PRILOCAINE 2.5-2.5 % EX CREA: TOPICAL_CREAM | CUTANEOUS | 0 refills | Status: DC

## 2016-05-30 MED ORDER — HEPARIN SOD (PORK) LOCK FLUSH 100 UNIT/ML IV SOLN: 500.0000 [IU] | Freq: Once | INTRAVENOUS | Status: DC | PRN

## 2016-05-30 NOTE — Telephone Encounter (Signed)
-----   Message from Gordy Levan, MD sent at 05/28/2016 10:39 AM EST ----- Hospital consult, recurrent ovarian, speaks Spanish. Family fluent in [place], very supportive. Lots of chemo in [place] prior. Malignant ascites and bowel obstruction in hospital.  Depending on hospital DC, she will have first doxil either in hospital 12-26 or at [facility] on 12-29 She has been given written Doxil information in Spanish, but will need teaching by RN before or on 12-29. Need to be sure she has zofran/ ondansetron, ODT best. 8 mg q 8 hr prn nausea #30 May want EMLA for new PAC They may need Resource supplements from [place]  This message sent to schedulers and Isaiah Blakes: "NEEDS SPANISH INTERPRETER FOR each  MD AND CHEMO LL 12-29 at 1:15x 45 min  + lab from [facility] + chemo as scheduled at 2:30 US paracentesis 12-29  LL 1-5 at 1130 x 45 + lab from [place] 1-18 + lab from [facility]"  Messages also sent to managed care (Medicaid pending) and to [facility] [place] (needs Resource).  Thanks Audrey Garza

## 2016-05-30 NOTE — Progress Notes (Signed)
Nutrition Follow-up  DOCUMENTATION CODES:   Non-severe (moderate) malnutrition in context of chronic illness  INTERVENTION:   Diet advancement per MD If diet advanced, continue Boost Breeze po QID, each supplement provides 250 kcal and 9 grams of protein  RD will continue to monitor for diet advancement vs.need for nutrition support. If patient's diet not advanced soon, need to consider nutrition support.  NUTRITION DIAGNOSIS:   Increased nutrient needs related to cancer and cancer related treatments as evidenced by estimated needs.  Ongoing.  GOAL:   Patient will meet greater than or equal to 90% of their needs  Not meeting.  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 She started to have progressive abdominal bloating and pain in the last three weeks  Patient beginning chemotherapy today. Pt is currently NPO. Pt consumed 100% of a clear liquid tray on 12/24 along with Boost Breeze supplements. NGT was replaced 12/25 for bowel obstruction.  RD will continue to monitor for diet advancement. Pt will need nutrition support if unable to advance diet. Recommend tube placement if bowel obstruction resolves. Would also monitor for refeeding syndrome once diet advanced/feedings begin.  Medications: IV Protonix once daily, D5 -.9% NaCl infusion at 100 ml/hr -provides 408 kcal Labs reviewed: Mg/K WNL  Diet Order:  Diet NPO time specified Except for: [employer], Sips with Meds  Skin:  Reviewed, no issues  Last BM:  12/24  Height:   Ht Readings from Last 1 Encounters:  05/20/16 5\' 3"  (1.6 m)    Weight:   Wt Readings from Last 1 Encounters:  05/20/16 176 lb 12.9 oz (80.2 kg)    Ideal Body Weight:  52.3 kg  BMI:  Body mass index is 31.32 kg/m.  Estimated Nutritional Needs:   Kcal:   [id number]  Protein:  75-85g  Fluid:  1.7-1.9L/day  EDUCATION NEEDS:   No education needs identified at this time  Clayton Bibles, MS, RD, LDN Pager: [phone] After Hours Pager: [phone]

## 2016-05-30 NOTE — Progress Notes (Signed)
PROGRESS NOTE    Audrey Garza  [id number] DOB: 1940/09/25 DOA: 05/20/2016 PCP: No primary care provider on file.    Brief Narrative:  75 year old female with a history of metastatic ovarian cancer presented with progressive abdominal bloating and pain for 3 weeks duration. She was feeling well until the first week of December, 2017 when she was sick with a virus from which she never recovered. She states that she began feeling distended in the abdomen and uncomfortable and was taken to the ED on 05/20/16. The patient reports being diagnosed with stage III ovarian cancer in January, 2017. She received primary debulking surgery followed by 8 adjuvant cycles of carboplatin and paclitaxel followed by an additional 4 cycles of what she describes as maintenance carboplatin, paclitaxel and avastin. Her last dose of therapy was in September 2017   CT chest/abd/pelvis9-21-17 reportedly had no evidence of malignancy, with some bullous emphysema, tortuous sigmoid colon with diverticulosis. . Recommendation was for maintenance avastin x 1 year, however patient then came to [facility] in 02-2016. She has not established with any physicians since coming to [place]. CT AP 05-20-16 lias arge volume ascites, extensive peritoneal disease with omental thickening, trace bilateral pleural effusions, left colonic diverticulosis, bilateral inguinal hernias containing fat, post hysterectomy, no adnexal masses, no adenopathy. US paracentesis 05-22-16 for 2.9 liters of yellow fluid. 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 Unfortunately, after paracentesis she began having increasing abdominal pain. Abdominal x-ray revealed distended small bowel loops concerning for small bowel ejection. Gen. surgery was consulted. NG was inserted to suction.    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 -She still seems to have good performance status -appreciate Drs. Marko Plume and Denman George -. 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  -Per Dr. Denman George--- not a candidate for interval debulking surgery due to the platinum resistant nature of her disease--if she fails advancement of diet, she should be considered for palliative G tube Oncology following.- Per oncology starting chemotherapy today, 05/30/2016.  Small Bowel Obstruction -05/23/2016 abdominal x-ray multiple dilated loops of bowel concerning for small bowel obstruction -Likely due to the extensive peritoneal carcinomatosis -12/19--NG tube inserted to suction -Patient now passing flatus and had bowel movement 12/19 and 12/20  -12/21 Abd xray--persistent distended loops of sm bowel c/w SBO -Diet advancement per general surgery-->remain NPO for now--feels she  -Appreciate general surgery consult-->unlikely to fix with an operation - No BM today 05/26/2016. NGT clamped per oncology rxcs since last night 05/26/2016. -05/27/2016 patient passing flatus. No bowel movement. Burping. Patient denies any abdominal pain. NG tube has been clamped since last night and patient with no nausea no emesis no abdominal pain. Plain films from 05/27/2016 with findings compatible with a resolving or resolved  small bowel obstruction. - NG tube discontinued on 05/27/2016. Patient maintained on clear  liquids.  Patient with complaints of abdominal fullness and no bowel movement the morning . Abdominal ultrasound done had minimal ascitic fluid to be drained. Plain films of the abdomen done showed some distention of the small bowel consistent with recurrent small bowel obstruction. Patient nothing by mouth for bowel rest. Plain films done today 05/29/2016 with findings compatible with small bowel obstruction with slight interval worsening with bowel loops measuring 6.8 cm in diameter. Patient with some complaints of abdominal fullness. Patient however had 2 bowel movements yesterday. Patient denies any emesis. NG tube has been reordered per oncology to be placed. Repeat abdominal films 05/30/2016 with improvement in high-grade small bowel obstruction. Patient to be started on chemotherapy per oncology today 05/30/2016. Replete electrolytes. Keep potassium greater than 4. Keep magnesium at 2.   - Per [facility] note, family have requested not to be seen by Dr.Toth III any longer and he has recommended that if surgery is needed recommend transfer to an academic center. General surgery has signed off. - Per general surgery abdominal films from 05/24/2016 with findings compatible with patient's known small bowel obstruction and interval worsening of small bowel obstruction. unlikely obstruction can be fixed with an operation report on a low for some palliative bypass however unlikely to prolong patient's life for significant time. Patient with a poor prognosis. -Follow.  Mild hyponatremia:  -Secondary to volume depletion -Improved with hydration.   Mild thrombocytosis: -likely reactive.    DVT prophylaxis: Lovenox Code Status: Full Family Communication: Updated patient and family at bedside. Disposition Plan: Home when medically stable and passing gas and having bowel movements, and per  oncology.   Consultants:   GYN oncology Dr. Denman George 05/23/2016  Gen. surgery Dr.Toth III 05/23/2016  Oncology: Dr. Marko Plume 05/23/2016  Procedures:   CT abdomen and pelvis 05/20/2016  Abdominal x-rays 05/23/2016, 05/24/2016, 05/25/2016, 05/27/2016, 05/28/2016, 05/30/2016, 05/28/2016, 05/29/2016  Right IJ single-lumen power port catheter insertion per Dr. Annamaria Boots 05/24/2016  2-D echo 05/24/2016  Ultrasound-guided paracentesis--2.9 L of clear yellow fluid removed--Dr. Earleen Newport 05/22/2016 cytology positive for adenocarcinoma  Antimicrobials:   IV Ancef 05/24/2016 1 dose.   Subjective: Patient denies nausea. No emesis. Patient passing flatus. Patient states some improvement with abdominal distention and fullness. Less belching. No chest pain. No shortness of breath. Yet  Objective: Vitals:   05/29/16 0655 05/29/16 1411 05/29/16 2142 05/30/16 0504  BP: 138/76 (!) 155/94 (!) 152/91 (!) 145/86  Pulse: 78 99 100 93  Resp: 16 18 16 14   Temp: 98 F (36.7 C) 98.2 F (36.8 C) 98.2 F (36.8 C) 97.7 F (36.5 C)  TempSrc: Oral Oral Oral Oral  SpO2: 96% 95% 97% 99%  Weight:      Height:        Intake/Output Summary (Last 24 hours) at 05/30/16 1114 Last data filed at 05/30/16 0505  Gross per 24 hour  Intake          1368.33 ml  Output                0 ml  Net          1368.33 ml   Filed Weights   05/20/16 1332 05/20/16 1818  Weight: 78 kg (171 lb 15.3 oz) 80.2 kg (176 lb 12.9 oz)    Examination:  General exam: Appears calm and comfortable.NG tube in place. Respiratory system: Clear to auscultation. Respiratory effort normal. Cardiovascular system: S1 & S2 heard, RRR. No JVD, murmurs, rubs, gallops or clicks. No pedal edema. Gastrointestinal system: Abdomen is  soft, nontender to palpation, positive bowel sounds, mildly distended. No organomegaly or masses felt. Central nervous system: Alert and oriented. No focal neurological deficits. Extremities: Symmetric 5 x 5  power. Skin: No rashes, lesions or ulcers Psychiatry: Judgement and insight appear normal. Mood & affect appropriate.     Data Reviewed: I have personally reviewed following labs and imaging studies  CBC:  Recent Labs Lab 05/24/16 0400 05/26/16 0555 05/27/16 1040 05/28/16 1007 05/29/16 1009  WBC 8.0 6.9 8.0 7.0 9.6  NEUTROABS 5.1  --  5.1 4.7  --   HGB 12.4 11.6* 12.5 11.5* 12.7  HCT 37.5 34.9* 37.9 34.6* 38.1  MCV 89.7 87.3 87.9 87.2 87.0  PLT 499* 407* 498* 429* [id number]*   Basic Metabolic Panel:  Recent Labs Lab 05/24/16 0400 05/25/16 0407 05/26/16 0555 05/27/16 1040 05/28/16 1007 05/29/16 1009  NA 138 141 137 136 134* 138  K 4.3 3.7 3.7 3.7 3.3* 3.8  CL 100* 105 101 100* 100* 105  CO2 29 27 27 27 25 25   GLUCOSE 113* 109* 96 131* 147* 136*  BUN 21* 19 15 13 9 6   CREATININE 0.90 0.84 0.69 0.85 0.77 0.74  CALCIUM 8.7* 8.5* 8.5* 8.6* 8.0* 8.3*  MG 2.3  --   --  2.0 1.8 2.4   GFR: Estimated Creatinine Clearance: 60.9 mL/min (by C-G formula based on SCr of 0.74 mg/dL). Liver Function Tests: No results for input(s): AST, ALT, ALKPHOS, BILITOT, PROT, ALBUMIN in the last 168 hours. No results for input(s): LIPASE, AMYLASE in the last 168 hours. No results for input(s): AMMONIA in the last 168 hours. Coagulation Profile: No results for input(s): INR, PROTIME in the last 168 hours. Cardiac Enzymes: No results for input(s): CKTOTAL, CKMB, CKMBINDEX, TROPONINI in the last 168 hours. BNP (last 3 results) No results for input(s): PROBNP in the last 8760 hours. HbA1C: No results for input(s): HGBA1C in the last 72 hours. CBG: No results for input(s): GLUCAP in the last 168 hours. Lipid Profile: No results for input(s): CHOL, HDL, LDLCALC, TRIG, CHOLHDL, LDLDIRECT in the last 72 hours. Thyroid Function Tests: No results for input(s): TSH, T4TOTAL, FREET4, T3FREE, THYROIDAB in the last 72 hours. Anemia Panel: No results for input(s): VITAMINB12, FOLATE, FERRITIN, TIBC,  IRON, RETICCTPCT in the last 72 hours. Sepsis Labs: No results for input(s): PROCALCITON, LATICACIDVEN in the last 168 hours.  Recent Results (from the past 240 hour(s))  Culture, body fluid-bottle     Status: None   Collection Time: 05/22/16 11:50 AM  Result Value Ref Range Status   Specimen Description FLUID  Final   Special Requests ASCTIES  Final   Culture   Final    NO GROWTH 5 DAYS Performed at [facility]    Report Status 05/27/2016 FINAL  Final  Gram stain     Status: None   Collection Time: 05/22/16 11:50 AM  Result Value Ref Range Status   Specimen Description FLUID  Final   Special Requests ASCTIES  Final   Gram Stain   Final    FEW WBC PRESENT,BOTH PMN AND MONONUCLEAR NO ORGANISMS SEEN Performed at [facility]    Report Status 05/22/2016 FINAL  Final  Urine culture     Status: Abnormal   Collection Time: 05/25/16 10:48 PM  Result Value Ref Range Status   Specimen Description URINE, CLEAN CATCH  Final   Special Requests NONE  Final   Culture (A)  Final    <10,000 COLONIES/mL INSIGNIFICANT GROWTH Performed at  [facility]    Report Status 05/27/2016 FINAL  Final         Radiology Studies: Dg Abd 1 View  Result Date: 05/29/2016 CLINICAL DATA:  75 year old female with nasogastric tube placement. Subsequent encounter. EXAM: ABDOMEN - 1 VIEW COMPARISON:  05/29/2016. FINDINGS: Nasogastric tube tip curled upon itself in the distal esophagus and needs to be reposition. Gas distended small bowel loops measuring up to 6.8 cm consistent with small bowel obstruction similar to earlier exam. The possibility of free intraperitoneal air cannot be assessed on a supine view. IMPRESSION: Nasogastric tube tip curled upon itself in the distal esophagus and needs to be reposition. Small bowel obstruction. These results will be called to the ordering clinician or representative by the Radiologist Assistant, and communication documented in the PACS or zVision  Dashboard. Electronically Signed   By: Genia Del M.D.   On: 05/29/2016 14:46   Dg Abd 2 Views  Result Date: 05/30/2016 CLINICAL DATA:  Followup of small bowel obstruction. Nasogastric tube in place. EXAM: ABDOMEN - 2 VIEW COMPARISON:  1 day prior FINDINGS: Supine and right-sided decubitus views. The right-sided decubitus view demonstrates no free intraperitoneal air. Supine view demonstrates a nasogastric terminating at the body of the stomach. Gaseous distension of small bowel loops. Example loop in the mid abdomen measures 5.7 cm today versus 7.1 cm on the prior. Paucity of colonic gas. Minimal rectal gas. IMPRESSION: Improvement in high-grade small bowel obstruction. Electronically Signed   By: Abigail Miyamoto M.D.   On: 05/30/2016 08:47   Dg Abd 2 Views  Result Date: 05/29/2016 CLINICAL DATA:  History of small bowel obstruction and abdominal distension. Diagnosed with ovarian cancer 1 year ago EXAM: ABDOMEN - 2 VIEW COMPARISON:  05/28/2016 and CT 05/20/2016 FINDINGS: Exam demonstrates multiple air-filled dilated small bowel loops measuring 6.8 cm in diameter. Several air-fluid levels on the upright film. Findings are slightly worse. No free peritoneal air. Minimal air within the colon. Mild degenerate change of the spine and hips. 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 Electronically Signed   By: Marin Olp M.D.   On: 05/29/2016 09:38   Dg Abd Portable 1v  Result Date: 05/29/2016 CLINICAL DATA:  NG tube repositioning EXAM: PORTABLE ABDOMEN - 1 VIEW COMPARISON:  05/29/2016 1335 hours FINDINGS: NG tube tip is now on the fundus of the stomach. Dilated small bowel loops persist across the abdomen. There is also gas within colon. There is no obvious free intraperitoneal gas. IMPRESSION: NG tube tip is now all in the fundus of the stomach. Distended  loops of small and large bowel persists. Electronically Signed   By: Marybelle Killings M.D.   On: 05/29/2016 15:55        Scheduled Meds: . dextrose   Intravenous Once  . DOXOrubicin LIPOSOMAL  70 mg Intravenous Once  . enoxaparin (LOVENOX) injection  40 mg Subcutaneous Q24H  . feeding supplement  1 Container Oral QID  . ondansetron (ZOFRAN) IV  8 mg Intravenous Once  . pantoprazole (PROTONIX) IV  40 mg Intravenous Q24H   Continuous Infusions: . dextrose 5 % and 0.9% NaCl 100 mL/hr at 05/30/16 0804     LOS: 9 days    Time spent: 39 mins   THOMPSON,DANIEL, MD [facility] Pager [phone]  If 7PM-7AM, please contact night-coverage [email or web address] Password [email or web address] 05/30/2016, 11:14 AM

## 2016-05-30 NOTE — Progress Notes (Signed)
Medical Oncology May 30, 2016, 9:21 AM  Hospital day 11 Antibiotics: none Chemotherapy: last in [place] 02-02-16. Last platinum either May 2017 or early summer 2017. Last avastin at least 01-3016.  EMR reviewed. Patient seen, daughter here. Spoke with inpatient pharmacist on unit  Subjective: NG placed by RN on 12-25. Still not entirely comfortable in abdomen, tho better than 12-25, still some discomfort bilateral lower quadrants when stands.  Bowels moved this AM and x1 yesterday, is passing flatus. Less nausea, less belching since NG in. Feels hungry. No cough, no SOB. Voiding ok. Has not been walking in hall last 24 hrs. Did sleep last pm.      PAC by IR Flu vaccine 05-28-16  Oncologic History Patient had hysterectomy without oophorectomy in her 38s for benign indication, and been in her usual good health until July 2016, when she developed abdominal discomfort and bloating. In late 03-2015 she was found to have large pelvic mass and perihepatic ascites. CA 125 then was 359, with CEA of 3, CA 19-9 5.8 and alpha fetoprotein 2.3. She had exploratory laparotomy 04-19-15, which was not done by gyn oncologist, with bilateral ovaried an tumors, 10 cm with capsule ruptured on right , smaller on left, peritoneal carcinomatosis. Washings were done, however accompanying information does not describe extent of debulking. She had some bowel obstruction post operatively, with NG used ~ POD 3. Pathology available 05-05-15 showed cystic and solid left ovarian mass, 13 cm right ovarian mass, moderately differentiated tumor "tumor de celulas de la granulosa". 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 CT AP 122016 had ascites, carcinomatosis and multiple peritoneal implants. CA 125 on 05-24-15 baseline for chemo was 141. She received 3 cycles of taxol + carboplatin from 05-26-15 thru 07-07-15 (not clear if avastin given those  cycles), after which carboplatin was not available in [place]. She received taxol 175 mg/m2, CDDP and avastin for 3 additional cycles thru 10-07-15. Second look laparotomy was discussed, patient declined, and instead had 4 cycles avastin + taxol from 12-01-15 thru 02-02-16 (family is confirming, but we believe no CDDP after 10-07-15 and no carboplatin after 07-07-15). CT CAP 02-24-16 reportedly had no evidence of malignancy, with some bullous emphysema, tortuous sigmoid colon with diverticulosis. . Recommendation was for maintenance avastin x 1 year, however patient then came to [facility] in 02-2016. She has not established with any physicians since coming to [place]. CT AP 05-20-16 lias arge volume ascites, extensive peritoneal disease with omental thickening, trace bilateral pleural effusions, left colonic diverticulosis, bilateral inguinal hernias containing fat, post hysterectomy, no adnexal masses, no adenopathy. US paracentesis 05-22-16 for 2.9 liters of yellow fluid. Cytology adenocarcinoma withserous features, immunohistochemical stains confirm gyn.   Objective: Vital signs in last 24 hours: Blood pressure (!) 145/86, pulse 93, temperature 97.7 F (36.5 C), temperature source Oral, resp. rate 14, height 5\' 3"  (1.6 m), weight 176 lb 12.9 oz (80.2 kg), SpO2 99 %.   Intake/Output from previous day: 12/25 0701 - 12/26 0700 In: 1368.3 [I.V.:1268.3; IV Piggyback:100] Out: -  Intake/Output this shift: No intake/output data recorded.  Physical exam: Up in chair with NG to suction listening to audio book, respirations not labored, repositions and stands without assistance. NG with clear brownish drainage, no coffee grounds now. Oral mucosa without lesions. PERRL, slight scleral edema on left. No JVD. PAC infusing without difficulty, site ok. IV still at 100. Heart RRR. Lungs clear to bases posteriorly and anteriorly. Abdomen no more distended  than 12-25, quiet, not quite tight, not tender to gentle exam. LE no edema,  cords, tenderness. Speech fluent. Interacts well with daughter and MD.  Lab Results: CMET and CBC diff pending 05-30-16 (for doxil)  Recent Labs  05/28/16 1007 05/29/16 1009  WBC 7.0 9.6  HGB 11.5* 12.7  HCT 34.6* 38.1  PLT 429* 458*   BMET  Recent Labs  05/28/16 1007 05/29/16 1009  NA 134* 138  K 3.3* 3.8  CL 100* 105  CO2 25 25  GLUCOSE 147* 136*  BUN 9 6  CREATININE 0.77 0.74  CALCIUM 8.0* 8.3*    Studies/Results: Dg Abd 1 View  Result Date: 05/29/2016 CLINICAL DATA:  75 year old female with nasogastric tube placement. Subsequent encounter. EXAM: ABDOMEN - 1 VIEW COMPARISON:  05/29/2016. FINDINGS: Nasogastric tube tip curled upon itself in the distal esophagus and needs to be reposition. Gas distended small bowel loops measuring up to 6.8 cm consistent with small bowel obstruction similar to earlier exam. The possibility of free intraperitoneal air cannot be assessed on a supine view. IMPRESSION: Nasogastric tube tip curled upon itself in the distal esophagus and needs to be reposition. Small bowel obstruction. These results will be called to the ordering clinician or representative by the Radiologist Assistant, and communication documented in the PACS or zVision Dashboard. Electronically Signed   By: Genia Del M.D.   On: 05/29/2016 14:46   Dg Abd 1 View  Result Date: 05/28/2016 CLINICAL DATA:  Increased abdominal distention and patient with recent small bowel obstruction. Status post NG tube removal. EXAM: ABDOMEN - 1 VIEW COMPARISON:  Two views of the abdomen 05/27/2016. FINDINGS: NG tube has been removed. There is new dilatation of small bowel loops up to 4.9 cm. No evidence of free air on single view of the abdomen. IMPRESSION: New gaseous distention of small bowel worrisome for recurrent obstruction. Electronically Signed   By: Inge Rise M.D.   On: 05/28/2016 10:36   US Abdomen Limited  Result Date: 05/28/2016 CLINICAL DATA:  History of abdominal  carcinomatosis and ascites due to ovarian carcinoma. Evaluate for paracentesis. EXAM: LIMITED ABDOMEN ULTRASOUND FOR ASCITES TECHNIQUE: Limited ultrasound survey for ascites was performed in all four abdominal quadrants. COMPARISON:  CT abdomen and pelvis 05/20/2016. FINDINGS: Small volume of abdominal and pelvic ascites is identified and inadequate for paracentesis. IMPRESSION: As above. Electronically Signed   By: Inge Rise M.D.   On: 05/28/2016 11:12   Dg Abd 2 Views  Result Date: 05/30/2016 CLINICAL DATA:  Followup of small bowel obstruction. Nasogastric tube in place. EXAM: ABDOMEN - 2 VIEW COMPARISON:  1 day prior FINDINGS: Supine and right-sided decubitus views. The right-sided decubitus view demonstrates no free intraperitoneal air. Supine view demonstrates a nasogastric terminating at the body of the stomach. Gaseous distension of small bowel loops. Example loop in the mid abdomen measures 5.7 cm today versus 7.1 cm on the prior. Paucity of colonic gas. Minimal rectal gas. IMPRESSION: Improvement in high-grade small bowel obstruction. Electronically Signed   By: Abigail Miyamoto M.D.   On: 05/30/2016 08:47   Dg Abd 2 Views  Result Date: 05/29/2016 CLINICAL DATA:  History of small bowel obstruction and abdominal distension. Diagnosed with ovarian cancer 1 year ago EXAM: ABDOMEN - 2 VIEW COMPARISON:  05/28/2016 and CT 05/20/2016 FINDINGS: Exam demonstrates multiple air-filled dilated small bowel loops measuring 6.8 cm in diameter. Several air-fluid levels on the upright film. Findings are slightly worse. No free peritoneal air. Minimal air within the colon. Mild  degenerate change of the spine and hips. 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 Electronically Signed   By: Marin Olp M.D.   On: 05/29/2016 09:38   Dg Abd Portable 1v  Result Date:  05/29/2016 CLINICAL DATA:  NG tube repositioning EXAM: PORTABLE ABDOMEN - 1 VIEW COMPARISON:  05/29/2016 1335 hours FINDINGS: NG tube tip is now on the fundus of the stomach. Dilated small bowel loops persist across the abdomen. There is also gas within colon. There is no obvious free intraperitoneal gas. IMPRESSION: NG tube tip is now all in the fundus of the stomach. Distended loops of small and large bowel persists. Electronically Signed   By: Marybelle Killings M.D.   On: 05/29/2016 15:55   DISCUSSION: More comfortable with partial SBO since NG replaced on 05-29-16.  Will proceed with doxil today, this chemo generally well tolerated from standpoint of little nausea and generally less cytopenias. Discussed using lotion to all skin several times daily and avoiding friction to skin for several days after each doxil. Orders entered as nursing communication to use oral ice during doxil infusion, to decrease chemo mucositis. Pharmacist taking decadron out of premeds, will use just zofran premed; has zofran, phenergan, ativan available as prn antiemetics   Gyn oncologist will be at [facility] on 12-27 and I will update, including recurrent partial SBO. General Surgery not following now. Will need to consider TNA if unable to get oral nutrition, to give chemo time to show response. NOTE patient is otherwise very healthy and it is not clear from my history that she actually had platinum chemo since 10-2015, thus may not be platinum resistant. Doxil still given now due to complicated situation with significant bowel obstruction.  May need repeat CT. Note also diverticulosis by CT.   Assessment/Plan: 1.advanced ovarian carcinoma: Bilateral ovarian by history,  likely high grade serous by cytology from malignant ascites 05-22-16, IIIC. Progressive within 4 months of taxol + avastin (last 02-02-16); appears thatlast CDDP was 10-2015 andlast carboplatin was 07-07-15, so may not be technically platinum resistant.. Rapidly  symptomatic with malignant ascites and peritoneal carcinomatosis in 2 weeks PTA. Post paracentesis 2.9 liters on 05-22-16.  Will give chemotherapy with first doxil today. This agent should not cause significant nausea/ vomiting and generally cytopenias are not marked, usually occur >2 weeks after treatment, tho any improvement in carcinomatosis likely not rapid.  Even with this history, she may get significant improvement/ some period of control of diseasefrom additional systemic treatment; she appears in very good condition otherwise.  2. Small bowel obstruction: improved with bowel rest as of 12-23, with NG clamped then removed, but has reoccurred including marked distension of small bowel loops. Some better clinically and by xray today since NG replaced on 12-25, continue.  Labs pending, supplemented K and Mg 12-25 3.NOTE marked diverticulosis by CT, without evidence diverticulitis then. ? If this is contributing to bowel obstruction also 4.small bilateral pleural effusions by CXR likely related to recurrent gyn cancer. Not symptomatic now. 5.inguinal hernias containing only fat 6.aortic atherosclerosis by CT 7.social situation: recently moved from [place] to be with daughters in [place], family very supportive. Speaks Spanish, will need interpreter at office  8. Nutrition: poor po intake beginning 2-3 days prior to admission, and mostly NPO for past week. Likes Resource, is lactose intolerant. Recommended at least 4 Resource daily for now if possible.  9.PAC placed by IR 10. Echocardiogram  with good LVF, done for doxil 11. flu vaccine done 05-28-16  All questions answered. Chemo orders confirmed. Please call or page if needed between my rounds cell [phone] or pager [phone]  Evlyn Clines MD

## 2016-05-30 NOTE — Progress Notes (Signed)
Dose and dilution for Doxil verified with 2nd chemo competent RN Reyne Dumas.

## 2016-05-31 ENCOUNTER — Inpatient Hospital Stay (HOSPITAL_COMMUNITY): Payer: Medicaid Other

## 2016-05-31 DIAGNOSIS — K56600 Partial intestinal obstruction, unspecified as to cause: Secondary | ICD-10-CM

## 2016-05-31 DIAGNOSIS — C561 Malignant neoplasm of right ovary: Secondary | ICD-10-CM

## 2016-05-31 DIAGNOSIS — C562 Malignant neoplasm of left ovary: Secondary | ICD-10-CM

## 2016-05-31 LAB — CBC
HCT: 36 % (ref 36.0–46.0)
Hemoglobin: 12 g/dL (ref 12.0–15.0)
MCH: 29.3 pg (ref 26.0–34.0)
MCHC: 33.3 g/dL (ref 30.0–36.0)
MCV: 87.8 fL (ref 78.0–100.0)
PLATELETS: 437 10*3/uL — AB (ref 150–400)
RBC: 4.1 MIL/uL (ref 3.87–5.11)
RDW: 13 % (ref 11.5–15.5)
WBC: 10 10*3/uL (ref 4.0–10.5)

## 2016-05-31 LAB — BASIC METABOLIC PANEL
Anion gap: 7 (ref 5–15)
BUN: 6 mg/dL (ref 6–20)
CO2: 24 mmol/L (ref 22–32)
CREATININE: 0.48 mg/dL (ref 0.44–1.00)
Calcium: 8.1 mg/dL — ABNORMAL LOW (ref 8.9–10.3)
Chloride: 108 mmol/L (ref 101–111)
GFR calc Af Amer: >60 mL/min (ref >60)
GLUCOSE: 147 mg/dL — AB (ref 65–99)
Potassium: 3.5 mmol/L (ref 3.5–5.1)
Sodium: 139 mmol/L (ref 135–145)

## 2016-05-31 LAB — PHOSPHORUS: Phosphorus: 3.3 mg/dL (ref 2.5–4.6)

## 2016-05-31 LAB — HEPATIC FUNCTION PANEL
ALT: 7 U/L — AB (ref 14–54)
AST: 11 U/L — AB (ref 15–41)
Albumin: 2.4 g/dL — ABNORMAL LOW (ref 3.5–5.0)
Alkaline Phosphatase: 62 U/L (ref 38–126)
BILIRUBIN TOTAL: 0.6 mg/dL (ref 0.3–1.2)
Total Protein: 5.5 g/dL — ABNORMAL LOW (ref 6.5–8.1)

## 2016-05-31 LAB — GLUCOSE, CAPILLARY: Glucose-Capillary: 148 mg/dL — ABNORMAL HIGH (ref 65–99)

## 2016-05-31 LAB — MAGNESIUM: Magnesium: 1.8 mg/dL (ref 1.7–2.4)

## 2016-05-31 MED ORDER — POTASSIUM PHOSPHATES 15 MMOLE/5ML IV SOLN
15.0000 mmol | Freq: Once | INTRAVENOUS | Status: AC
  Administered 2016-05-31: 15 mmol via INTRAVENOUS
  Filled 2016-05-31: qty 5

## 2016-05-31 MED ORDER — POTASSIUM CHLORIDE 10 MEQ/100ML IV SOLN
10.0000 meq | INTRAVENOUS | Status: DC
Start: 2016-06-01 — End: 2016-06-01
  Administered 2016-06-01 (×3): 10 meq via INTRAVENOUS
  Filled 2016-05-31 (×4): qty 100

## 2016-05-31 MED ORDER — MAGNESIUM SULFATE IN D5W 1-5 GM/100ML-% IV SOLN
1.0000 g | Freq: Once | INTRAVENOUS | Status: AC
  Administered 2016-06-01: 1 g via INTRAVENOUS
  Filled 2016-05-31: qty 100

## 2016-05-31 MED ORDER — FAT EMULSION 20 % IV EMUL
240.0000 mL | INTRAVENOUS | Status: AC
  Administered 2016-05-31: 240 mL via INTRAVENOUS
  Filled 2016-05-31: qty 240

## 2016-05-31 MED ORDER — SODIUM CHLORIDE 0.9% FLUSH
10.0000 mL | INTRAVENOUS | Status: DC | PRN
Start: 2016-05-31 — End: 2016-06-13

## 2016-05-31 MED ORDER — TRACE MINERALS CR-CU-MN-SE-ZN 10-1000-500-60 MCG/ML IV SOLN
INTRAVENOUS | Status: AC
  Administered 2016-05-31: 18:00:00 via INTRAVENOUS
  Filled 2016-05-31: qty 960

## 2016-05-31 MED ORDER — INSULIN ASPART 100 UNIT/ML ~~LOC~~ SOLN
0.0000 [IU] | Freq: Four times a day (QID) | SUBCUTANEOUS | Status: DC
  Administered 2016-06-01 (×3): 2 [IU] via SUBCUTANEOUS
  Administered 2016-06-01 (×2): 1 [IU] via SUBCUTANEOUS
  Administered 2016-06-02: 0 [IU] via SUBCUTANEOUS
  Administered 2016-06-02: 1 [IU] via SUBCUTANEOUS
  Administered 2016-06-02: 2 [IU] via SUBCUTANEOUS
  Administered 2016-06-03 (×2): 1 [IU] via SUBCUTANEOUS

## 2016-05-31 NOTE — Progress Notes (Addendum)
PHARMACY - ADULT TOTAL PARENTERAL NUTRITION CONSULT NOTE   Pharmacy Consult for TPN Indication: partial small bowel obstruction  Patient Measurements: Height: 5\' 3"  (160 cm) Weight: 176 lb 12.9 oz (80.2 kg) IBW/kg (Calculated) : 52.4 TPN AdjBW (KG): 58.8 Body mass index is 31.32 kg/m.  Insulin Requirements: none  Current Nutrition: none, NPO  IVF: D5-NS at 100 ml/hr  Central access: implanted port TPN start date: 12/27  ASSESSMENT                                                                                                          HPI: 30 yoF with h/o metastatic ovarian cancer with extensive peritoneal disease with omental thickening with large malignant ascites and partial SBO likely 2/2 metastatic disease. Per MD note, has been 12 days since she has been able to take anything PO. She has NGT to LIWS and has failed multiple attempts to d/c NG and advance diet.  TPN to start 12/27.  Significant events:  12/26 D1C1 Doxil 40mg /m2 - tolerated well  Today, 05/31/2016:    Glucose - no h/o DM, serum glucoses 96-147  Electrolytes - WNL   Renal - WNL  LFTs - low  TGs - ordered for AM  Prealbumin - ordered for AM  NUTRITIONAL GOALS                                                                                             RD recs (12/26): 1750-1950 kcal, 75-85g protein, 1.7-1.9L/day fluid Clinimix E 5/20 at a goal rate of 70 ml/hr + 20% fat emulsion at 10 ml/hr to provide: 84 g/day protein, 1958 Kcal/day.  (+++There is currently a Psychologist, prison and probation services of Clinimix solution.  Pharmacy will use Clinimix product based on what we have available in stock+++)  PLAN                                                                                                                         At 1800 today:  Start Clinimix 5/15 at 40 ml/hr.  Since will be providing electrolyte-free formulation TPN and this patient is at risk for refeeding syndrome, will also provide the  following:  KCl 10 mEq IV x 4  Potassium Phosphate 15 mmol IV x 1  Magnesium sulfate 1gm IV x 1  20% fat emulsion at 10 ml/hr.  Plan to advance as tolerated to the goal rate.  TPN to contain standard multivitamins and trace elements.  Reduce IVF to 60 ml/hr.  Add sensitive SSI and CBG checks q6h.   TPN lab panels on Mondays & Thursdays.  F/u daily.  Hershal Coria 05/31/2016,4:37 PM

## 2016-05-31 NOTE — Progress Notes (Signed)
Medical Oncology May 31, 2016, 10:12 AM  Hospital day 12 Antibiotics: none Chemotherapy: first doxil 05-30-16  EMR reviewed. Patient seen with daughter following abdominal xray just done.  Subjective: Tolerated first doxil on 05-30-16 without difficulty. No nausea, no mucositis, no skin irritation and is using lotion. Feels more weak, does not feel strong enough to try walking in hall, tho 3-4 days ago was walking 12 laps. Abdomen uncomfortable, tho bowels again moved small amount yesterday and today, and passing flatus. Some belching, no vomiting. NG has been to suction other than down for xray now. Some pain generalized in abdomen and in lower abdomen. No SOB at rest, no cough. NG very uncomfortable in throat. PAC functioning without difficulty.  Oncologic History Patient had hysterectomy without oophorectomy in her 20s for benign indication, and been in her usual good health until July 2016, when she developed abdominal discomfort and bloating. In late 03-2015 she was found to have large pelvic mass and perihepatic ascites. CA 125 then was 359, with CEA of 3, CA 19-9 5.8 and alpha fetoprotein 2.3. She had exploratory laparotomy 04-19-15, which was not done by gyn oncologist, with bilateral ovaried an tumors, 10 cm with capsule ruptured on right , smaller on left, peritoneal carcinomatosis. Washings were done, however accompanying information does not describe extent of debulking. She had some bowel obstruction post operatively, with NG used ~ POD 3. Pathology available 05-05-15 showed cystic and solid left ovarian mass, 13 cm right ovarian mass, moderately differentiated tumor "tumor de celulas de la granulosa". 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 CT AP 122016 had ascites, carcinomatosis and multiple peritoneal implants. CA 125 on 05-24-15 baseline for chemo was 141. She received 3 cycles of taxol + carboplatin  from 05-26-15 thru 07-07-15 (not clear if avastin given those cycles), after which carboplatin was not available in [place]. She received taxol 175 mg/m2, CDDP and avastin for 3 additional cycles thru 10-07-15. Second look laparotomy was discussed, patient declined, and instead had 4 cycles avastin + taxol from 12-01-15 thru 02-02-16 (family is confirming, but we believe no CDDP after 10-07-15 and no carboplatin after 07-07-15). CT CAP 02-24-16 reportedly had no evidence of malignancy, with some bullous emphysema, tortuous sigmoid colon with diverticulosis. . Recommendation was for maintenance avastin x 1 year, however patient then came to [facility] in 02-2016. She has not established with any physicians since coming to [place]. CT AP 05-20-16 lias arge volume ascites, extensive peritoneal disease with omental thickening, trace bilateral pleural effusions, left colonic diverticulosis, bilateral inguinal hernias containing fat, post hysterectomy, no adnexal masses, no adenopathy. US paracentesis 05-22-16 for 2.9 liters of yellow fluid. Cytology adenocarcinoma withserous features, immunohistochemical stains confirm gyn. First doxil given 05-30-16, chosen due to side effect profile but note may still be platinum sensitive.   Objective: Vital signs in last 24 hours: Blood pressure (!) 157/98, pulse 100, temperature 98.6 F (37 C), temperature source Oral, resp. rate 16, height 5\' 3"  (1.6 m), weight 176 lb 12.9 oz (80.2 kg), SpO2 96 %.   Intake/Output from previous day: 12/26 0701 - 12/27 0700 In: 1000 [I.V.:1000] Out: -  Intake/Output this shift: No intake/output data recorded.  Physical exam: awake, alert, lying in bed with NG clamped just back from radiology.  Looks fatigued. PERRL,  Not icteric. Oral mucosa moist and clear. NG no coffee grounds in tubing. Lungs without wheezes or rales. Abdomen distended unchanged from my exam 12-26, quiet, not tender to  gentle exam, no clear fluid wave. LE no edema, cords, tenderness.  PAC ok, IVF at 100. Repositions without assistance.  Lab Results:  Recent Labs  05/29/16 1009 05/30/16 1200  WBC 9.6 10.1  HGB 12.7 12.6  HCT 38.1 37.5  PLT 458* 445*   BMET  Recent Labs  05/29/16 1009 05/30/16 1200  NA 138 137  K 3.8 3.9  CL 105 106  CO2 25 25  GLUCOSE 136* 134*  BUN 6 6  CREATININE 0.74 0.76  CALCIUM 8.3* 8.1*    Studies/Results: Study Result  05-21-16   CLINICAL DATA:  Abdominal bloating. History of ovarian carcinoma. Small bowel obstruction.  EXAM: ABDOMEN - 2 VIEW  COMPARISON:  May 30, 2016  FINDINGS: Supine and upright images obtained. Nasogastric tube tip and side port are in the stomach. There are multiple loops of dilated small bowel multiple air-fluid levels. Minimal areas seen in the rectum. No free air is evident.  IMPRESSION: Bowel gas pattern appearance is consistent with small bowel obstruction. No evident free air. Nasogastric tube tip and side port in stomach. Appearance is similar to 1 day prior.    PACs images reviewed with patient and daughter   DISCUSSION Tolerated first doxil on 12-26 without difficulty. Do not expect rapid improvement in recurrent ovarian cancer, but should not cause much nausea or hopefully much drop in blood counts.   Unfortunately persistent partial SBO despite NG and bowel rest. Not tolerating NG very well, and no nutrition x 12+ days.  She is otherwise healthy and may NOT have platinum resistant recurrent ovarian cancer based on best history we have obtained. I wonder if repeat CT would give any additional useful information (note also extensive diverticular disease on initial CT). She likely needs short term TNA to support chemo done yesterday/ also if any consideration of surgery to address the bowel obstruction or for G tube to get NG out.  I have discussed all of this with patient and daughter now, emphasizing that TNA would be for short term if it is begun.  Patient wants to continue  to try to treat, would agree with CT and other interventions if may help.   I have sent update to gyn oncology. Would suggest asking general surgery to see again.  I believe there was possibly some miscommunication with language barrier when  general surgery saw last.  Assessment/Plan: 1.advanced ovarian carcinoma: Bilateral ovarian by history,  likely high grade serous by cytology from malignant ascites 05-22-16, IIIC. Progressive within 4 months of taxol + avastin (last 02-02-16); appears thatlast CDDP was 10-2015 andlast carboplatin was 07-07-15, so may not be technically platinum resistant.. Rapidly symptomatic with malignant ascites and peritoneal carcinomatosis in 2 weeks PTA. Post paracentesis 2.9 liters on 05-22-16, no significant ascites by [place] 12-24. . First doxil given 05-30-16; doxil should not cause significant nausea/ vomiting and generally cytopenias are not marked, usually occur >2 weeks after treatment, tho any improvement in carcinomatosis likely not rapid.  Even with this history, she may get significant improvement/ some period of control of diseasefrom additional systemic treatment; she has been in very good condition otherwise, tho will need nutrition to maintain this. Consider short term TNA.  2. Small bowel obstruction: improved with bowel rest as of 12-23, with NG clamped then removed, but  reoccurred including marked distension of small bowel loops by 12-25, no change by xray or exam today despite ongoing NG suction. Consider repeat CT, general surgery and gyn oncology input needed.  3. marked  diverticulosis by CT, without evidence diverticulitis then. ? If this is contributing to bowel problems 4.small bilateral pleural effusions by CXR likely related to recurrent gyn cancer. Not symptomatic now. 5.inguinal hernias containing only fat 6.aortic atherosclerosis by CT 7.social situation: recently moved from [place] to be with daughters in [place], family very supportive. Speaks  Spanish, will need interpreter at office  8. Nutrition: poor po intake beginning 2-3 days prior to admission, still NPO with SBO. Consider short term TNA to support chemo just given / possible surgical intervention  9.PAC placed by IR 10. Echocardiogram with good LVF, done for doxil 11. flu vaccine done 05-28-16   Please page [phone] or cell [phone] if I can help prior to next rounds Evlyn Clines MD

## 2016-05-31 NOTE — Progress Notes (Addendum)
Patient ID: Audrey Garza, female   DOB: Feb 28, 1941, 75 y.o.   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 She underwent oophorectomy in [place] last year and finished chemo about 3 months ago. Patient has been admitted to [facility] since 05/20/16 with SBO likely 2/2 to metastatic disease. It has been 12 days since she has been able to take anything PO. [name] is that she is getting weaker and weaker every day, and they want to discuss options for improving her nutritional status.  Patient has NGT to LIWS. She is passing small amount of flatus and had BM yesterday. She has failed multiple attempts to d/c NG and advance diet.  Of note, she did start chemo yesterday. They know that this will not cure her cancer, but hope that it may prolong her life.  Objective: Vital signs in last 24 hours: Temp:  [98.2 F (36.8 C)-98.6 F (37 C)] 98.2 F (36.8 C) (12/27 1544) Pulse Rate:  [91-100] 91 (12/27 1544) Resp:  [16-18] 18 (12/27 1544) BP: (147-157)/(77-98) 149/77 (12/27 1544) SpO2:  [95 %-96 %] 95 % (12/27 1544) Last BM Date: 05/28/16  Intake/Output from previous day: 12/26 0701 - 12/27 0700 In: 1000 [I.V.:1000] Out: -  Intake/Output this shift: No intake/output data recorded.  PE: Gen:  Alert, NAD, pleasant Card:  RRR, no M/G/R heard Pulm:  CTAB, no W/R/R, effort normal Abd: Soft, moderate distension, tympanic, mild diffuse tenderness, +BS, no HSM Ext:  No erythema, edema, or tenderness   Lab Results:   Recent Labs  05/29/16 1009 05/30/16 1200  WBC 9.6 10.1  HGB 12.7 12.6  HCT 38.1 37.5  PLT 458* 445*   BMET  Recent Labs  05/29/16 1009 05/30/16 1200  NA 138 137  K 3.8 3.9  CL 105 106  CO2 25 25  GLUCOSE 136* 134*  BUN 6 6  CREATININE 0.74 0.76  CALCIUM 8.3* 8.1*   PT/INR No  results for input(s): LABPROT, INR in the last 72 hours. CMP     Component Value Date/Time   NA 137 05/30/2016 1200   K 3.9 05/30/2016 1200   CL 106 05/30/2016 1200   CO2 25 05/30/2016 1200   GLUCOSE 134 (H) 05/30/2016 1200   BUN 6 05/30/2016 1200   CREATININE 0.76 05/30/2016 1200   CALCIUM 8.1 (L) 05/30/2016 1200   PROT 5.6 (L) 05/30/2016 1200   ALBUMIN 2.8 (L) 05/30/2016 1200   AST 13 (L) 05/30/2016 1200   ALT 10 (L) 05/30/2016 1200   ALKPHOS 62 05/30/2016 1200   BILITOT 0.3 05/30/2016 1200   GFRNONAA >60 05/30/2016 1200   GFRAA >60 05/30/2016 1200   Lipase  No results found for: LIPASE     Studies/Results: Dg Abd 2 Views  Result Date: 05/31/2016 CLINICAL DATA:  Abdominal bloating. History of ovarian carcinoma. Small bowel obstruction. EXAM: ABDOMEN - 2 VIEW COMPARISON:  May 30, 2016 FINDINGS: Supine and upright images obtained. Nasogastric tube tip and side port are in the stomach. There are multiple loops of dilated small bowel multiple air-fluid levels. Minimal areas seen in the rectum. No free air is evident. IMPRESSION: Bowel gas pattern appearance is consistent with small bowel obstruction. No evident free air. Nasogastric tube tip and side port in stomach. Appearance is similar to 1 day prior. Electronically Signed   By: Gwyndolyn Saxon  Jasmine December III M.D.   On: 05/31/2016 09:33   Dg Abd 2 Views  Result Date: 05/30/2016 CLINICAL DATA:  Followup of small bowel obstruction. Nasogastric tube in place. EXAM: ABDOMEN - 2 VIEW COMPARISON:  1 day prior FINDINGS: Supine and right-sided decubitus views. The right-sided decubitus view demonstrates no free intraperitoneal air. Supine view demonstrates a nasogastric terminating at the body of the stomach. Gaseous distension of small bowel loops. Example loop in the mid abdomen measures 5.7 cm today versus 7.1 cm on the prior. Paucity of colonic gas. Minimal rectal gas. IMPRESSION: Improvement in high-grade small bowel obstruction.  Electronically Signed   By: Abigail Miyamoto M.D.   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 Denman George: not a candidate for interval debulking surgery due to the platinum resistant nature of her disease - Per oncology started chemotherapy (doxil) 05/30/2016  Plan - [name] is that she is getting weaker and weaker every day, and they want to discuss options for improving her nutritional status. She has discussed short term TPN with oncology, which patient is agreeable to, but she would also like to know if there is something more long term that could help.  Will discuss with MD.  Of note, patient started chemotherapy (doxil) 05/30/2016.  Patient and family do still seem to have unrealistic expectations. Would recommend consulting palliative for input/recommendations.   LOS: 10 days    [name] , [facility] Surgery 05/31/2016, 4:28 PM Pager: [phone] Consults: [phone] Mon-Fri 7:00 am-4:30 pm Sat-Sun 7:00 am-11:30 am  Agree with above. I spoke to Dr. Marko Plume on 06/01/2016.  I spent 30+ minutes with patient, daughter Audrey Garza, and other family per phone.  She has advanced ovarian cancer with diffuse abdominal carcinomatosis. Dr. Serita Grit note of 05/25/2016 outlines the surgical problems well.  The patient is interested in taking po's.  She is have a couple of small bowel movements per day.  She can have liquids with or without clamping her  NGT.   Since her initial chemotx was 12/26 - it is probably worth giving this at least a week to see if there is any benefit.  A decompressive gastrostomy tube could be placed (by GI, IR, or [place]), is it appears there is no progress.  Will follow.  Alphonsa Overall, MD, [facility] Surgery Pager: [phone] Office phone:  [phone]

## 2016-05-31 NOTE — Progress Notes (Signed)
Medical Oncology  See full Medical Oncology note from this AM also Gyn oncology suggests TNA and bowel rest for now.  Godfrey Pick, MD

## 2016-05-31 NOTE — Progress Notes (Signed)
PROGRESS NOTE    Audrey Garza  [id number] DOB: Jan 24, 1941 DOA: 05/20/2016 PCP: No primary care provider on file.    Brief Narrative:  75 year old female with a history of metastatic ovarian cancer presented with progressive abdominal bloating and pain for 3 weeks duration. She was feeling well until the first week of December, 2017 when she was sick with a virus from which she never recovered. She states that she began feeling distended in the abdomen and uncomfortable and was taken to the ED on 05/20/16. The patient reports being diagnosed with stage III ovarian cancer in January, 2017. She received primary debulking surgery followed by 8 adjuvant cycles of carboplatin and paclitaxel followed by an additional 4 cycles of what she describes as maintenance carboplatin, paclitaxel and avastin. Her last dose of therapy was in September 2017   CT chest/abd/pelvis9-21-17 reportedly had no evidence of malignancy, with some bullous emphysema, tortuous sigmoid colon with diverticulosis. . Recommendation was for maintenance avastin x 1 year, however patient then came to [facility] in 02-2016. She has not established with any physicians since coming to [place]. CT AP 05-20-16 lias arge volume ascites, extensive peritoneal disease with omental thickening, trace bilateral pleural effusions, left colonic diverticulosis, bilateral inguinal hernias containing fat, post hysterectomy, no adnexal masses, no adenopathy. US paracentesis 05-22-16 for 2.9 liters of yellow fluid. 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 Unfortunately, after paracentesis she began having increasing abdominal pain. Abdominal x-ray revealed distended small bowel loops concerning for small bowel ejection. Gen. surgery was consulted. NG was inserted to suction.   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 -She still seems to have good performance status -appreciate Drs. Marko Plume and Denman George - Progressive within 4 months of last systemic treatment, which seems to have been taxol + avastin 02-02-16 -rapidly symptomatic with malignant ascites and peritoneal carcinomatosis.  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  -Per Dr. Denman George--- not a candidate for interval debulking surgery due to the platinum resistant nature of her disease--if she fails advancement of diet, she should be considered for palliative G tube - Oncology following - Per oncology starting chemotherapy 05/30/2016  Small Bowel Obstruction -05/23/2016 abdominal x-ray multiple dilated loops of bowel concerning for small bowel obstruction -Likely due to the extensive peritoneal carcinomatosis -12/19--NG tube inserted to suction -Patient now passing flatus and has had small bowel movements -12/21 Abdxray--persistent distended loops of smbowel c/w SBO - Still NPO- will order TPN today to provide some sort of nutrition - Plain films of the abdomen done after clamping of NGT showed some distention of the small bowel consistent with recurrent small bowel obstruction - continue Bowel rest - NGT still with significant output - General surgery reconsulted for possible G-tube vs. Input on any other guidance for nutrition - Replete electrolytes. Keep potassium greater than 4. Keep magnesium at 2.  Mild hyponatremia:  -Secondary to volume depletion -Improved with  hydration - will repeat BMP in am  Mild thrombocytosis: -  likely reactive    DVT prophylaxis: Lovenox Code Status: Full Family Communication: Updated patients daughter who is bedside. Disposition Plan: unclear disposition at this time- patient SBO not resolving and she needs nutrition.  Guarded disposition. Will likely need significant HH or SNF at time of discharge    Consultants:   GYN oncology Dr. Denman George 05/23/2016  Gen. surgery Dr.Toth III 05/23/2016  Oncology: Dr. Marko Plume 05/23/2016  Procedures:   CT abdomen and pelvis 05/20/2016  Abdominal x-rays 05/23/2016, 05/24/2016, 05/25/2016, 05/27/2016, 05/28/2016, 05/30/2016, 05/28/2016, 05/29/2016  Right IJ single-lumen power port catheter insertion per Dr. Annamaria Boots 05/24/2016  2-D echo 05/24/2016  Ultrasound-guided paracentesis--2.9 L of clear yellow fluid removed--Dr. Earleen Newport 05/22/2016 cytology positive for adenocarcinoma  Antimicrobials:   IV Ancef 05/24/16 x 1 dose    Subjective: Patient laying in bed this am.  Reports minimal abdominal pain.  Says she trusts whatever the doctors say is necessary to help with her bowel obstruction.  Open to having TPN for a short period of time.  NGT still with significant output.  Patient underwent abdominal Xray this am which shows SBO.     Objective: Vitals:   05/30/16 1450 05/30/16 2030 05/31/16 0736 05/31/16 1544  BP: (!) 149/78 (!) 147/88 (!) 157/98 (!) 149/77  Pulse: 92 100 100 91  Resp: 16 16 16 18   Temp: 98.5 F (36.9 C) 98.6 F (37 C) 98.6 F (37 C) 98.2 F (36.8 C)  TempSrc: Oral Oral Oral Oral  SpO2: 95% 95% 96% 95%  Weight:      Height:       No intake or output data in the 24 hours ending 05/31/16 1614 Filed Weights   05/20/16 1332 05/20/16 1818  Weight: 78 kg (171 lb 15.3 oz) 80.2 kg (176 lb 12.9 oz)    Examination:  General exam: Appears calm and comfortable  Respiratory system: Clear to auscultation. Respiratory effort normal. Cardiovascular  system: S1 & S2 heard, RRR. No JVD, murmurs, rubs, gallops or clicks. No pedal edema. Gastrointestinal system: Abdomen is distended, soft and minimally diffusely tender. Minimal Bowel sounds heard. Central nervous system: Alert and oriented. No focal neurological deficits. Extremities: Symmetric 4 x 5 power. Skin: No rashes, lesions or ulcers Psychiatry: Judgement and insight appear normal. Mood & affect appropriate.     Data Reviewed: I have personally reviewed following labs and imaging studies  CBC:  Recent Labs Lab 05/26/16 0555 05/27/16 1040 05/28/16 1007 05/29/16 1009 05/30/16 1200  WBC 6.9 8.0 7.0 9.6 10.1  NEUTROABS  --  5.1 4.7  --  7.3  HGB 11.6* 12.5 11.5* 12.7 12.6  HCT 34.9* 37.9 34.6* 38.1 37.5  MCV 87.3 87.9 87.2 87.0 87.8  PLT 407* 498* 429* 458* [id number]*   Basic Metabolic Panel:  Recent Labs Lab 05/26/16 0555 05/27/16 1040 05/28/16 1007 05/29/16 1009 05/30/16 1200  NA 137 136 134* 138 137  K 3.7 3.7 3.3* 3.8 3.9  CL 101 100* 100* 105 106  CO2 27 27 25 25 25   GLUCOSE 96 131* 147* 136* 134*  BUN 15 13 9 6 6   CREATININE 0.69 0.85 0.77 0.74 0.76  CALCIUM 8.5* 8.6* 8.0* 8.3* 8.1*  MG  --  2.0 1.8 2.4  --    GFR: Estimated Creatinine Clearance: 60.9 mL/min (by C-G formula based on SCr of 0.76 mg/dL). Liver Function Tests:  Recent Labs Lab 05/30/16 1200  AST 13*  ALT 10*  ALKPHOS 62  BILITOT 0.3  PROT 5.6*  ALBUMIN 2.8*   No results  for input(s): LIPASE, AMYLASE in the last 168 hours. No results for input(s): AMMONIA in the last 168 hours. Coagulation Profile: No results for input(s): INR, PROTIME in the last 168 hours. Cardiac Enzymes: No results for input(s): CKTOTAL, CKMB, CKMBINDEX, TROPONINI in the last 168 hours. BNP (last 3 results) No results for input(s): PROBNP in the last 8760 hours. HbA1C: No results for input(s): HGBA1C in the last 72 hours. CBG: No results for input(s): GLUCAP in the last 168 hours. Lipid Profile: No results  for input(s): CHOL, HDL, LDLCALC, TRIG, CHOLHDL, LDLDIRECT in the last 72 hours. Thyroid Function Tests: No results for input(s): TSH, T4TOTAL, FREET4, T3FREE, THYROIDAB in the last 72 hours. Anemia Panel: No results for input(s): VITAMINB12, FOLATE, FERRITIN, TIBC, IRON, RETICCTPCT in the last 72 hours. Sepsis Labs: No results for input(s): PROCALCITON, LATICACIDVEN in the last 168 hours.  Recent Results (from the past 240 hour(s))  Culture, body fluid-bottle     Status: None   Collection Time: 05/22/16 11:50 AM  Result Value Ref Range Status   Specimen Description FLUID  Final   Special Requests ASCTIES  Final   Culture   Final    NO GROWTH 5 DAYS Performed at [facility]    Report Status 05/27/2016 FINAL  Final  Gram stain     Status: None   Collection Time: 05/22/16 11:50 AM  Result Value Ref Range Status   Specimen Description FLUID  Final   Special Requests ASCTIES  Final   Gram Stain   Final    FEW WBC PRESENT,BOTH PMN AND MONONUCLEAR NO ORGANISMS SEEN Performed at [facility]    Report Status 05/22/2016 FINAL  Final  Urine culture     Status: Abnormal   Collection Time: 05/25/16 10:48 PM  Result Value Ref Range Status   Specimen Description URINE, CLEAN CATCH  Final   Special Requests NONE  Final   Culture (A)  Final    <10,000 COLONIES/mL INSIGNIFICANT GROWTH Performed at [facility]    Report Status 05/27/2016 FINAL  Final         Radiology Studies: Dg Abd 2 Views  Result Date: 05/31/2016 CLINICAL DATA:  Abdominal bloating. History of ovarian carcinoma. Small bowel obstruction. EXAM: ABDOMEN - 2 VIEW COMPARISON:  May 30, 2016 FINDINGS: Supine and upright images obtained. Nasogastric tube tip and side port are in the stomach. There are multiple loops of dilated small bowel multiple air-fluid levels. Minimal areas seen in the rectum. No free air is evident. IMPRESSION: Bowel gas pattern appearance is consistent with small bowel  obstruction. No evident free air. Nasogastric tube tip and side port in stomach. Appearance is similar to 1 day prior. Electronically Signed   By: Lowella Grip III M.D.   On: 05/31/2016 09:33   Dg Abd 2 Views  Result Date: 05/30/2016 CLINICAL DATA:  Followup of small bowel obstruction. Nasogastric tube in place. EXAM: ABDOMEN - 2 VIEW COMPARISON:  1 day prior FINDINGS: Supine and right-sided decubitus views. The right-sided decubitus view demonstrates no free intraperitoneal air. Supine view demonstrates a nasogastric terminating at the body of the stomach. Gaseous distension of small bowel loops. Example loop in the mid abdomen measures 5.7 cm today versus 7.1 cm on the prior. Paucity of colonic gas. Minimal rectal gas. IMPRESSION: Improvement in high-grade small bowel obstruction. Electronically Signed   By: Abigail Miyamoto M.D.   On: 05/30/2016 08:47        Scheduled Meds: . enoxaparin (LOVENOX) injection  40 mg Subcutaneous Q24H  . feeding supplement  1 Container Oral QID  . pantoprazole (PROTONIX) IV  40 mg Intravenous Q24H   Continuous Infusions: . dextrose 5 % and 0.9% NaCl 100 mL/hr at 05/31/16 1441     LOS: 10 days    Time spent: 35 minutes    Loretha Stapler, MD [facility] Pager [phone]  If 7PM-7AM, please contact night-coverage [email or web address] Password [email or web address] 05/31/2016, 4:14 PM

## 2016-06-01 LAB — GLUCOSE, CAPILLARY
GLUCOSE-CAPILLARY: 140 mg/dL — AB (ref 65–99)
GLUCOSE-CAPILLARY: 152 mg/dL — AB (ref 65–99)
Glucose-Capillary: 152 mg/dL — ABNORMAL HIGH (ref 65–99)
Glucose-Capillary: 153 mg/dL — ABNORMAL HIGH (ref 65–99)

## 2016-06-01 LAB — CBC
HEMATOCRIT: 35.8 % — AB (ref 36.0–46.0)
Hemoglobin: 11.8 g/dL — ABNORMAL LOW (ref 12.0–15.0)
MCH: 29.1 pg (ref 26.0–34.0)
MCHC: 33 g/dL (ref 30.0–36.0)
MCV: 88.2 fL (ref 78.0–100.0)
Platelets: 411 10*3/uL — ABNORMAL HIGH (ref 150–400)
RBC: 4.06 MIL/uL (ref 3.87–5.11)
RDW: 12.9 % (ref 11.5–15.5)
WBC: 10.6 10*3/uL — AB (ref 4.0–10.5)

## 2016-06-01 LAB — TRIGLYCERIDES: TRIGLYCERIDES: 125 mg/dL (ref <150)

## 2016-06-01 LAB — COMPREHENSIVE METABOLIC PANEL
ALBUMIN: 2.3 g/dL — AB (ref 3.5–5.0)
ALT: 10 U/L — AB (ref 14–54)
AST: 12 U/L — AB (ref 15–41)
Alkaline Phosphatase: 61 U/L (ref 38–126)
Anion gap: 6 (ref 5–15)
BUN: 8 mg/dL (ref 6–20)
CHLORIDE: 105 mmol/L (ref 101–111)
CO2: 26 mmol/L (ref 22–32)
CREATININE: 0.56 mg/dL (ref 0.44–1.00)
Calcium: 7.9 mg/dL — ABNORMAL LOW (ref 8.9–10.3)
GFR calc Af Amer: >60 mL/min (ref >60)
GFR calc non Af Amer: >60 mL/min (ref >60)
GLUCOSE: 136 mg/dL — AB (ref 65–99)
Potassium: 3.8 mmol/L (ref 3.5–5.1)
SODIUM: 137 mmol/L (ref 135–145)
Total Bilirubin: 0.6 mg/dL (ref 0.3–1.2)
Total Protein: 5.2 g/dL — ABNORMAL LOW (ref 6.5–8.1)

## 2016-06-01 LAB — DIFFERENTIAL
BASOS ABS: 0 10*3/uL (ref 0.0–0.1)
BASOS PCT: 0 %
Eosinophils Absolute: 0.1 10*3/uL (ref 0.0–0.7)
Eosinophils Relative: 1 %
Lymphocytes Relative: 16 %
Lymphs Abs: 1.7 10*3/uL (ref 0.7–4.0)
MONOS PCT: 13 %
Monocytes Absolute: 1.3 10*3/uL — ABNORMAL HIGH (ref 0.1–1.0)
NEUTROS ABS: 7.5 10*3/uL (ref 1.7–7.7)
NEUTROS PCT: 70 %

## 2016-06-01 LAB — PHOSPHORUS: Phosphorus: 3.5 mg/dL (ref 2.5–4.6)

## 2016-06-01 LAB — PREALBUMIN: Prealbumin: <5 mg/dL — ABNORMAL LOW (ref 18–38)

## 2016-06-01 LAB — MAGNESIUM: Magnesium: 1.9 mg/dL (ref 1.7–2.4)

## 2016-06-01 MED ORDER — KCL IN DEXTROSE-NACL 20-5-0.9 MEQ/L-%-% IV SOLN
INTRAVENOUS | Status: DC
  Administered 2016-06-01: 11:00:00 via INTRAVENOUS
  Filled 2016-06-01: qty 1000

## 2016-06-01 MED ORDER — TRACE MINERALS CR-CU-MN-SE-ZN 10-1000-500-60 MCG/ML IV SOLN
INTRAVENOUS | Status: DC
  Filled 2016-06-01: qty 1440

## 2016-06-01 MED ORDER — FAT EMULSION 20 % IV EMUL
240.0000 mL | INTRAVENOUS | Status: DC
  Filled 2016-06-01: qty 250

## 2016-06-01 MED ORDER — POTASSIUM CHLORIDE IN NACL 20-0.9 MEQ/L-% IV SOLN
INTRAVENOUS | Status: AC
  Administered 2016-06-01: 19:00:00 via INTRAVENOUS
  Filled 2016-06-01: qty 1000

## 2016-06-01 MED ORDER — TRACE MINERALS CR-CU-MN-SE-ZN 10-1000-500-60 MCG/ML IV SOLN
INTRAVENOUS | Status: AC
  Administered 2016-06-01: 17:00:00 via INTRAVENOUS
  Filled 2016-06-01: qty 1440

## 2016-06-01 MED ORDER — KCL IN DEXTROSE-NACL 20-5-0.9 MEQ/L-%-% IV SOLN
INTRAVENOUS | Status: DC
  Filled 2016-06-01: qty 1000

## 2016-06-01 MED ORDER — FAT EMULSION 20 % IV EMUL
240.0000 mL | INTRAVENOUS | Status: AC
  Administered 2016-06-01: 240 mL via INTRAVENOUS
  Filled 2016-06-01: qty 250

## 2016-06-01 NOTE — Progress Notes (Signed)
PROGRESS NOTE    Audrey Garza  [id number] DOB: [birth year] DOA: 05/20/2016 PCP: No primary care provider on file.    Brief Narrative:  75 year old female with a history of metastatic ovarian cancer presented with progressive abdominal bloating and pain for 3 weeks duration. She was feeling well until the first week of December, 2017 when she was sick with a virus from which she never recovered. She states that she began feeling distended in the abdomen and uncomfortable and was taken to the ED on 05/20/16. The patient reports being diagnosed with stage III ovarian cancer in January, 2017. She received primary debulking surgery followed by 8 adjuvant cycles of carboplatin and paclitaxel followed by an additional 4 cycles of what she describes as maintenance carboplatin, paclitaxel and avastin. Her last dose of therapy was in September 2017   CT chest/abd/pelvis9-21-17 reportedly had no evidence of malignancy, with some bullous emphysema, tortuous sigmoid colon with diverticulosis. . Recommendation was for maintenance avastin x 1 year, however patient then came to [facility] in 02-2016. She has not established with any physicians since coming to [place]. CT AP 05-20-16 lias arge volume ascites, extensive peritoneal disease with omental thickening, trace bilateral pleural effusions, left colonic diverticulosis, bilateral inguinal hernias containing fat, post hysterectomy, no adnexal masses, no adenopathy. US paracentesis 05-22-16 for 2.9 liters of yellow fluid. 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 Unfortunately, after paracentesis she began having increasing abdominal pain. Abdominal x-ray revealed distended small bowel loops concerning for small bowel ejection. Gen. surgery was consulted. NG was inserted to suction.   Assessment & Plan:   Active Problems:   Malignant ascites   Malnutrition of moderate degree   Malignant neoplasm of ovary (HCC)   SBO (small bowel  obstruction)   Gastroesophageal reflux disease   N&V (nausea and vomiting)   Impaired nasal gastric feeding tube   Ileus (HCC)   Metastatic ovarian cancer with extensive peritoneal disease with omental thickening with large ascites: -Family brought in prior medical record, but in [place]. -appreciate Drs. Marko Plume and Denman George - Progressive within 4 months of last systemic treatment, which seems to have been taxol + avastin 02-02-16 -rapidly symptomatic with malignant ascites and peritoneal carcinomatosis.  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 Denman George--- not a candidate for interval debulking surgery due to the platinum resistant nature of her disease--if she fails advancement of diet, she should be considered for palliative G tube (discussed with surgery today) - Per oncology chemotherapy starting 05/30/2016 - unclear patient's prognosis and also unclear is patient's and family's understanding of that prognosis  Small Bowel Obstruction -05/23/2016 abdominal x-ray multiple dilated loops of bowel concerning for small bowel obstruction -Likely due to the extensive peritoneal carcinomatosis -12/19--NG tube inserted to suction -Patient now passing flatus and has had small bowel movements -12/21 Abdxray--persistent distended loops of smbowel c/w SBO - will be allowed to have small sips of fluids and if tolerates can try taking NG off suction or clamping it - Plain films of the abdomen done after clamping of NGT showed some distention of the small bowel consistent with recurrent small bowel obstruction - NGT still with significant output (335ml in last 24 hours) - General surgery to follow at distance - TPN started yesterday - d/c IVF with D5 and transition to NS with 22mEq of KCl to decrease risk of  refeeding syndrome - will order PICC for TPN only   Mild hyponatremia:  -Secondary to volume depletion -Improved with hydration - sodium WNL this am  Mild thrombocytosis: -likely reactive    DVT prophylaxis: Lovenox Code Status: Full Family Communication: Updated patients daughter who is bedside. Disposition Plan: unclear disposition at this time- patient SBO not resolving and she needs nutrition.  Patient currently is Medicaid potential but does not have insurance so even if she improved to be be able to discharge home with or without NGT cost of care, TPN (if needed) and follow up are a concern.    Consultants:   GYN oncology Dr. Denman George 05/23/2016  Gen. surgery Dr.Toth III 05/23/2016  Oncology: Dr. Marko Plume 05/23/2016  Procedures:   CT abdomen and pelvis 05/20/2016  Abdominal x-rays 05/23/2016, 05/24/2016, 05/25/2016, 05/27/2016, 05/28/2016, 05/30/2016, 05/28/2016, 05/29/2016  Right IJ single-lumen power port catheter insertion per Dr. Annamaria Boots 05/24/2016  2-D echo 05/24/2016  Ultrasound-guided paracentesis--2.9 L of clear yellow fluid removed--Dr. Earleen Newport 05/22/2016 cytology positive for adenocarcinoma  Antimicrobials:   IV Ancef 05/24/16 x 1 dose    Subjective: Patient laying in bed this am.  Reports minimal abdominal pain.  Says she trusts whatever the doctors say is necessary to help with her bowel obstruction.  Open to having TPN for a short period of time.  NGT still with significant output.  Patient underwent abdominal Xray this am which shows SBO.     Objective: Vitals:   05/31/16 0736 05/31/16 1544 05/31/16 2050 06/01/16 0432  BP: (!) 157/98 (!) 149/77 137/71 126/64  Pulse: 100 91 89 87  Resp: 16 18 16 16   Temp: 98.6 F (37 C) 98.2 F (36.8 C) 98.1 F (36.7 C) 98.2 F (36.8 C)  TempSrc: Oral Oral Oral Oral  SpO2: 96% 95% 94% 95%  Weight:      Height:        Intake/Output Summary (Last 24 hours) at 06/01/16 1549 Last data filed at 06/01/16  0655  Gross per 24 hour  Intake          4826.85 ml  Output              900 ml  Net          3926.85 ml   Filed Weights   05/20/16 1332 05/20/16 1818  Weight: 78 kg (171 lb 15.3 oz) 80.2 kg (176 lb 12.9 oz)    Examination:  General exam: Appears calm and comfortable  Respiratory system: Clear to auscultation. Respiratory effort normal. Cardiovascular system: S1 & S2 heard, RRR. No JVD, murmurs, rubs, gallops or clicks. No pedal edema. Gastrointestinal system: Abdomen is distended, soft and minimally diffusely tender. Minimal Bowel sounds heard. Central nervous system: Alert and oriented. No focal neurological deficits. Extremities: Symmetric 4 x 5 power. Skin: No rashes, lesions or ulcers Psychiatry: Judgement appears normal. Unclear if patient understands prognosis. Mood & affect appropriate.     Data Reviewed: I have personally reviewed following labs and imaging studies  CBC:  Recent Labs Lab 05/27/16 1040 05/28/16 1007 05/29/16 1009 05/30/16 1200 05/31/16 1741 06/01/16 0416  WBC 8.0 7.0 9.6 10.1 10.0 10.6*  NEUTROABS 5.1 4.7  --  7.3  --  7.5  HGB 12.5 11.5* 12.7 12.6 12.0 11.8*  HCT 37.9 34.6* 38.1 37.5 36.0 35.8*  MCV 87.9 87.2 87.0 87.8 87.8 88.2  PLT 498* 429* 458* 445* 437* 123456*   Basic Metabolic Panel:  Recent Labs Lab 05/27/16 1040 05/28/16 1007 05/29/16 1009 05/30/16 1200 05/31/16  1741 06/01/16 0416  NA 136 134* 138 137 139 137  K 3.7 3.3* 3.8 3.9 3.5 3.8  CL 100* 100* 105 106 108 105  CO2 27 25 25 25 24 26   GLUCOSE 131* 147* 136* 134* 147* 136*  BUN 13 9 6 6 6 8   CREATININE 0.85 0.77 0.74 0.76 0.48 0.56  CALCIUM 8.6* 8.0* 8.3* 8.1* 8.1* 7.9*  MG 2.0 1.8 2.4  --  1.8 1.9  PHOS  --   --   --   --  3.3 3.5   GFR: Estimated Creatinine Clearance: 60.9 mL/min (by C-G formula based on SCr of 0.56 mg/dL). Liver Function Tests:  Recent Labs Lab 05/30/16 1200 05/31/16 1741 06/01/16 0416  AST 13* 11* 12*  ALT 10* 7* 10*  ALKPHOS 62 62 61    BILITOT 0.3 0.6 0.6  PROT 5.6* 5.5* 5.2*  ALBUMIN 2.8* 2.4* 2.3*   No results for input(s): LIPASE, AMYLASE in the last 168 hours. No results for input(s): AMMONIA in the last 168 hours. Coagulation Profile: No results for input(s): INR, PROTIME in the last 168 hours. Cardiac Enzymes: No results for input(s): CKTOTAL, CKMB, CKMBINDEX, TROPONINI in the last 168 hours. BNP (last 3 results) No results for input(s): PROBNP in the last 8760 hours. HbA1C: No results for input(s): HGBA1C in the last 72 hours. CBG:  Recent Labs Lab 05/31/16 2358 06/01/16 0558 06/01/16 1216  GLUCAP 148* 153* 140*   Lipid Profile:  Recent Labs  06/01/16 0416  TRIG 125   Thyroid Function Tests: No results for input(s): TSH, T4TOTAL, FREET4, T3FREE, THYROIDAB in the last 72 hours. Anemia Panel: No results for input(s): VITAMINB12, FOLATE, FERRITIN, TIBC, IRON, RETICCTPCT in the last 72 hours. Sepsis Labs: No results for input(s): PROCALCITON, LATICACIDVEN in the last 168 hours.  Recent Results (from the past 240 hour(s))  Urine culture     Status: Abnormal   Collection Time: 05/25/16 10:48 PM  Result Value Ref Range Status   Specimen Description URINE, CLEAN CATCH  Final   Special Requests NONE  Final   Culture (A)  Final    <10,000 COLONIES/mL INSIGNIFICANT GROWTH Performed at [facility]    Report Status 05/27/2016 FINAL  Final         Radiology Studies: Dg Abd 2 Views  Result Date: 05/31/2016 CLINICAL DATA:  Abdominal bloating. History of ovarian carcinoma. Small bowel obstruction. EXAM: ABDOMEN - 2 VIEW COMPARISON:  May 30, 2016 FINDINGS: Supine and upright images obtained. Nasogastric tube tip and side port are in the stomach. There are multiple loops of dilated small bowel multiple air-fluid levels. Minimal areas seen in the rectum. No free air is evident. IMPRESSION: Bowel gas pattern appearance is consistent with small bowel obstruction. No evident free air.  Nasogastric tube tip and side port in stomach. Appearance is similar to 1 day prior. Electronically Signed   By: Lowella Grip III M.D.   On: 05/31/2016 09:33        Scheduled Meds: . enoxaparin (LOVENOX) injection  40 mg Subcutaneous Q24H  . feeding supplement  1 Container Oral QID  . insulin aspart  0-9 Units Subcutaneous Q6H  . pantoprazole (PROTONIX) IV  40 mg Intravenous Q24H   Continuous Infusions: . dextrose 5 % and 0.9 % NaCl with KCl 20 mEq/L 60 mL/hr at 06/01/16 1101  . dextrose 5 % and 0.9 % NaCl with KCl 20 mEq/L    . TPN (CLINIMIX) Adult without lytes 40 mL/hr at 05/31/16 1802  And  . fat emulsion 240 mL (05/31/16 1802)  . Marland KitchenTPN (CLINIMIX-E) Adult     And  . fat emulsion       LOS: 11 days    Time spent: 30 minutes    Loretha Stapler, MD [facility] Pager [phone]  If 7PM-7AM, please contact night-coverage [email or web address] Password [facility] 06/01/2016, 3:49 PM

## 2016-06-01 NOTE — Progress Notes (Signed)
Patient stated she cannot tolerate the Potassium infusing at normal rate of 100.  She called nurse to her room with complaint of pain at IV site.  And could not even when reduced 70 cc per hour either.  Nurse turned K-run down to 50 cc per hour.   IV site has no changes and no redness or streaking. Vein appearance is same.  Rechecked and patient stated she can tolerate reduced rate.  Potassium now running at  50 cc per hour.  It will take longer to complete regimen.

## 2016-06-01 NOTE — Progress Notes (Addendum)
PHARMACY - ADULT TOTAL PARENTERAL NUTRITION CONSULT NOTE   Pharmacy Consult for TPN Indication: partial small bowel obstruction  Patient Measurements: Height: 5\' 3"  (160 cm) Weight: 176 lb 12.9 oz (80.2 kg) IBW/kg (Calculated) : 52.4 TPN AdjBW (KG): 58.8 Body mass index is 31.32 kg/m.  Insulin Requirements: 3 units Novolog overnight  Current Nutrition: NPO  IVF: D5-NS at 60 ml/hr  Central access: implanted port TPN start date: 12/27  ASSESSMENT                                                                                                          HPI: 56 yoF with h/o metastatic ovarian cancer with extensive peritoneal disease with omental thickening with large malignant ascites and partial SBO likely 2/2 metastatic disease. Per MD note, has been 12 days since she has been able to take anything PO. She has NGT to LIWS and has failed multiple attempts to d/c NG and advance diet.  TPN to start 12/27.  Significant events:  12/26 D1C1 Doxil 40mg /m2 - tolerated well  Today, 06/01/2016:    Glucose - no h/o DM, CBG range 148-153  Electrolytes - WNL including corrected Ca 9.26  Since electrolyte-free formula started last PM, was given Mg Sulfate 1g, KPhos 55mmol and ordered KCl 56meq x 4 runs but has only been able to tolerate 1 full run due to burning at IV site.  Renal - WNL  LFTs - low  TGs - pending this AM  Prealbumin - pending this AM  NUTRITIONAL GOALS                                                                                             RD recs (12/26): 1750-1950 kcal, 75-85g protein, 1.7-1.9L/day fluid Clinimix E 5/20 at a goal rate of 70 ml/hr + 20% fat emulsion at 10 ml/hr to provide: 84 g/day protein, 1958 Kcal/day.  (+++There is currently a Psychologist, prison and probation services of Clinimix solution.  Pharmacy will use Clinimix product based on what we have available in stock+++)  PLAN  At 1800 today:  Change to Clinimix E 5/20 and advance rate to 60 ml/hr.  20% fat emulsion at 10 ml/hr.  TPN to contain standard multivitamins and trace elements.  Reduce IVF to 40 ml/hr and add 54meq KCl per liter.  Continue sensitive SSI and CBG checks q6h.   TPN lab panels on Mondays & Thursdays.  F/u daily.  Peggyann Juba, PharmD, BCPS Pager: [phone] 06/01/2016,7:26 AM

## 2016-06-01 NOTE — Progress Notes (Signed)
Medical Oncology June 01, 2016, 8:34 AM  Hospital day 13 Antibiotics: none Chemotherapy: first doxil 05-30-16 TNA begun 05-31-16 PM  EMR reviewed. Discussed with Dr Lucia Gaskins directly, clarified that patient had tube clamped/ DCd once only on ~ 12-23 - 12-24. Clarified that she had initial CR to chemo in [place] and may not be platinum resistant, which would be better prognostically, tho doxil used due to better side effect profile given rest of situation. Mentioned that she appears to be in good shape otherwise, walking daily and active until 1-2 weeks PTA.  Patient seen with daughter, another daughter arrived at end of my visit.    Subjective: Less abdominal discomfort today. Passing gas and had bowel movement again this AM. No nausea or vomiting, with NG still to suction.  NG uncomfortable upper throat on left, but no oral mucositis. No skin irritation from doxil, is using lotion. No respiratory symptoms. Voiding well, No bleeding. No HA now. No problems with PAC, being used for TNA so peripheral blood draws and NSL peripheral IV. Multiple nursing interruptions thru night with TNA, so did not sleep. Felt too weak to try to walk in hall yesterday, "does not feel as hungry" this AM.   Oncologic History Patient had hysterectomy without oophorectomy in her 82s for benign indication, and been in her usual good health until July 2016, when she developed abdominal discomfort and bloating. In late 03-2015 she was found to have large pelvic mass and perihepatic ascites. CA 125 then was 359, with CEA of 3, CA 19-9 5.8 and alpha fetoprotein 2.3. She had exploratory laparotomy 04-19-15, which was not done by gyn oncologist, with bilateral ovaried an tumors, 10 cm with capsule ruptured on right , smaller on left, peritoneal carcinomatosis. Washings were done, however accompanying information does not describe extent of debulking. She had some bowel obstruction post operatively, with NG used ~ POD 3.  Pathology available 05-05-15 showed cystic and solid left ovarian mass, 13 cm right ovarian mass, moderately differentiated tumor "tumor de celulas de la granulosa". 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 CT AP 122016 had ascites, carcinomatosis and multiple peritoneal implants. CA 125 on 05-24-15 baseline for chemo was 141. She received 3 cycles of taxol + carboplatin from 05-26-15 thru 07-07-15 (not clear if avastin given those cycles), after which carboplatin was not available in [place]. She received taxol 175 mg/m2, CDDP and avastin for 3 additional cycles thru 10-07-15. Second look laparotomy was discussed, patient declined, and instead had 4 cycles avastin + taxol from 12-01-15 thru 02-02-16 (family is confirming, but we believe no CDDP after 10-07-15 and no carboplatin after 07-07-15). CT CAP 02-24-16 reportedly had no evidence of malignancy, with some bullous emphysema, tortuous sigmoid colon with diverticulosis. . Recommendation was for maintenance avastin x 1 year, however patient then came to [facility] in 02-2016. She has not established with any physicians since coming to [place]. CT AP 05-20-16 lias arge volume ascites, extensive peritoneal disease with omental thickening, trace bilateral pleural effusions, left colonic diverticulosis, bilateral inguinal hernias containing fat, post hysterectomy, no adnexal masses, no adenopathy. US paracentesis 05-22-16 for 2.9 liters of yellow fluid. Cytology adenocarcinoma withserous features, immunohistochemical stains confirm gyn. First doxil given 05-30-16, chosen due to side effect profile but note may still be platinum sensitive.   Objective: Vital signs in last 24 hours: Blood pressure 126/64, pulse 87, temperature 98.2 F (36.8 C), temperature source Oral, resp. rate 16, height 5\' 3"  (1.6  m), weight 176 lb 12.9 oz (80.2 kg), SpO2 95 %.   Intake/Output from previous day: 12/27 0701 - 12/28  0700 In: 4826.9 [I.V.:4371.9; IV Piggyback:455] Out: 900 [Urine:600; Emesis/NG output:300] Intake/Output this shift: No intake/output data recorded.  Physical exam: awake, alert, supine in bed on RA with NG to suction. Oral mucosa moist and clear. PAC site unremarkable, infusing TNA. NSL RUE. Lungs without wheezes or rales. Heart RRR. Abdomen moderately distended, no moreso than 12-17, quiet, soft, not tight. LE no edema, cords, tenderness. Moves all extremities equally. Feet warm.   Lab Results:  Recent Labs  05/31/16 1741 06/01/16 0416  WBC 10.0 10.6*  HGB 12.0 11.8*  HCT 36.0 35.8*  PLT 437* 411*   BMET  Recent Labs  05/31/16 1741 06/01/16 0416  NA 139 137  K 3.5 3.8  CL 108 105  CO2 24 26  GLUCOSE 147* 136*  BUN 6 8  CREATININE 0.48 0.56  CALCIUM 8.1* 7.9*    Studies/Results: Dg Abd 2 Views  Result Date: 05/31/2016 CLINICAL DATA:  Abdominal bloating. History of ovarian carcinoma. Small bowel obstruction. EXAM: ABDOMEN - 2 VIEW COMPARISON:  May 30, 2016 FINDINGS: Supine and upright images obtained. Nasogastric tube tip and side port are in the stomach. There are multiple loops of dilated small bowel multiple air-fluid levels. Minimal areas seen in the rectum. No free air is evident. IMPRESSION: Bowel gas pattern appearance is consistent with small bowel obstruction. No evident free air. Nasogastric tube tip and side port in stomach. Appearance is similar to 1 day prior. Electronically Signed   By: Lowella Grip III M.D.   On: 05/31/2016 09:33   Dg Abd 2 Views  Result Date: 05/30/2016 CLINICAL DATA:  Followup of small bowel obstruction. Nasogastric tube in place. EXAM: ABDOMEN - 2 VIEW COMPARISON:  1 day prior FINDINGS: Supine and right-sided decubitus views. The right-sided decubitus view demonstrates no free intraperitoneal air. Supine view demonstrates a nasogastric terminating at the body of the stomach. Gaseous distension of small bowel loops. Example loop  in the mid abdomen measures 5.7 cm today versus 7.1 cm on the prior. Paucity of colonic gas. Minimal rectal gas. IMPRESSION: Improvement in high-grade small bowel obstruction. Electronically Signed   By: Abigail Miyamoto M.D.   On: 05/30/2016 08:47     Assessment/Plan: 1.advanced ovarian carcinoma: Bilateral ovarian by history, likely high grade serous by cytology from malignant ascites 05-22-16, IIIC. Progressive within 4 months of taxol + avastin (last 02-02-16); appears thatlast CDDP was 10-2015 andlast carboplatin was 07-07-15, so may not be technically platinum resistant.. Rapidly symptomatic with malignant ascites and peritoneal carcinomatosis in 2 weeks PTA. Post paracentesis 2.9 liters on 05-22-16, no significant ascites by [place] 12-24. . First doxil given 05-30-16; doxil should not cause significant nausea/ vomiting and generally cytopenias are not marked, usually occur >2 weeks after treatment, tho any improvement in carcinomatosis likely not rapid.  Even with this history, she may get significant improvement/ some period of control of diseasefrom additional systemic treatment; she has been in very good condition otherwise, tho will need nutrition to maintain this. Agree with short term TNA begun 05-21-16 2. Small bowel obstruction: partial, reoccurred within 24 hours when NG removed. Appreciate general surgery seeing her again. Bowel rest with TNA for now. Concern that there may be multiple areas of obstruction with extensive carcinomatosis, may be best to continue conservative management to see if improvement with chemo given this week. 3. marked diverticulosis by CT, without evidence diverticulitis then. ?  If this is contributing to bowel problems 4.small bilateral pleural effusions by CXR likely related to recurrent gyn cancer. Not symptomatic now. 5.inguinal hernias containing only fat 6.aortic atherosclerosis by CT 7.social situation: recently moved from [place] to be with daughters in [place],  family very supportive. Speaks Spanish, will need interpreter at office  8. Nutrition: poor po intake beginning 2-3 days prior to admission, still NPO with SBO. Consider short term TNA to support chemo just given / possible surgical intervention  9.PAC placed by IR 10. Echocardiogram with good EF 05-25-16,  for doxil 11. flu vaccine done 05-28-16  Will follow with you. Please page or call if needed between my rounds   [phone], [place]  Evlyn Clines MD

## 2016-06-01 NOTE — Progress Notes (Signed)
Nutrition Follow-up  DOCUMENTATION CODES:   Non-severe (moderate) malnutrition in context of chronic illness  INTERVENTION:  Continue TPN per pharmacy. Appropriate use of TPN in setting of SBO with inadequate intake 9-10 days.  Monitor magnesium, potassium, and phosphorus daily for at least 3 days, MD to replete as needed, as pt is at risk for refeeding syndrome given moderate malnutrition, NPO/CLD with inadequate intake for 9-10 days.  Recommend utilizing dextrose-free IV fluids as TPN advancing to goal and patient at risk for refeeding syndrome.   As TPN is infusing through single-lumen Implanted Port, recommend line be dedicated solely for TPN.   Will monitor for discussions regarding goals of care.  NUTRITION DIAGNOSIS:   Increased nutrient needs related to cancer and cancer related treatments as evidenced by estimated needs.  Ongoing.  GOAL:   Patient will meet greater than or equal to 90% of their needs  Progressing with TPN.  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 She started to have progressive abdominal bloating and pain in the last three weeks  -Patient has been NPO since 12/24. -RD now consulted for new TPN. Clinimix 5/20 (electrolyte-free formula) was initiated yesterday at 40 ml/hr. Patient was given Mg Sulfate 1 gram, KPhos 72mmol, and KCL 75mEq x 1 run (goal was 4 runs but patient could not tolerate). -Palliative Medicine has been consulted to discuss goals of care.    Patient Spanish-speaking but spoke with her daughter at bedside who was able to translate. Patient reports experiencing abdominal pain but reports she always has this at baseline. Denies nausea. Reports having flatus and small bowel movements daily.  Patient and daughter asking what TPN is. Explained purpose of TPN and wrote out what TPN stands for on marker board in patient's room.   Access: Implanted Port placed 05/24/2016. Per RN this port is single-lumen.   TPN: Currently running Clinimix 5/20 @ 40 ml/hr with 20% ILE @ 10 ml/hr. Plan is to advance to Clinimix E 5/20 @ 60 ml/hr with 20% ILE @ 10 ml/hr tonight at 1800 (slow advancement for risk of refeeding). Goal regimen is Clinimix E 5/20 @ 70 ml/hr with 20% ILE @ 10 ml/hr which provides 1958 kcal, 84 grams protein, 1680 ml daily.  NG Tube: low intermittent suction with 900 ml output past 24 hrs  Medications reviewed and include: Novolog sliding scale Q6hrs (received 4 units past 24 hrs), pantoprazole, D5-NS with KCl 20 mEq/L @ 60 ml/hr (72 grams of dextrose, 245 kcal daily).   Labs reviewed: CBG 140-153. Potassium, Phosphorus, Magnesium WNL today. Triglycerides 125.   Discussed with RN.   Diet Order:  Diet NPO time specified Except for: Ice Chips, Sips with Meds TPN (CLINIMIX) Adult without lytes TPN (CLINIMIX-E) Adult  Skin:  Reviewed, no issues  Last BM:  05/31/2016  Height:   Ht Readings from Last 1 Encounters:  05/20/16 5\' 3"  (1.6 m)    Weight:   Wt Readings from Last 1 Encounters:  05/20/16 176 lb 12.9 oz (80.2 kg)    Ideal Body Weight:  52.3 kg  BMI:  Body mass index is 31.32 kg/m.  Estimated Nutritional Needs:   Kcal:  [id number]  Protein:  75-85g  Fluid:  1.7-1.9L/day  EDUCATION NEEDS:  No education needs identified at this time  Willey Blade, MS, RD, LDN Pager: [phone] After Hours Pager: [phone]

## 2016-06-02 ENCOUNTER — Inpatient Hospital Stay: Payer: Self-pay | Admitting: Oncology

## 2016-06-02 ENCOUNTER — Telehealth: Payer: Self-pay | Admitting: Oncology

## 2016-06-02 ENCOUNTER — Ambulatory Visit: Payer: Self-pay

## 2016-06-02 ENCOUNTER — Other Ambulatory Visit: Payer: Self-pay

## 2016-06-02 LAB — GLUCOSE, CAPILLARY
GLUCOSE-CAPILLARY: 139 mg/dL — AB (ref 65–99)
Glucose-Capillary: 147 mg/dL — ABNORMAL HIGH (ref 65–99)
Glucose-Capillary: 176 mg/dL — ABNORMAL HIGH (ref 65–99)

## 2016-06-02 LAB — COMPREHENSIVE METABOLIC PANEL
ALK PHOS: 64 U/L (ref 38–126)
ALT: 8 U/L — AB (ref 14–54)
ANION GAP: 5 (ref 5–15)
AST: 13 U/L — ABNORMAL LOW (ref 15–41)
Albumin: 2.4 g/dL — ABNORMAL LOW (ref 3.5–5.0)
BUN: 14 mg/dL (ref 6–20)
CALCIUM: 7.9 mg/dL — AB (ref 8.9–10.3)
CHLORIDE: 102 mmol/L (ref 101–111)
CO2: 25 mmol/L (ref 22–32)
CREATININE: 0.52 mg/dL (ref 0.44–1.00)
GFR calc non Af Amer: >60 mL/min (ref >60)
Glucose, Bld: 170 mg/dL — ABNORMAL HIGH (ref 65–99)
Potassium: 3.7 mmol/L (ref 3.5–5.1)
SODIUM: 132 mmol/L — AB (ref 135–145)
Total Bilirubin: 0.4 mg/dL (ref 0.3–1.2)
Total Protein: 5.2 g/dL — ABNORMAL LOW (ref 6.5–8.1)

## 2016-06-02 LAB — PHOSPHORUS: PHOSPHORUS: 3.2 mg/dL (ref 2.5–4.6)

## 2016-06-02 LAB — MAGNESIUM: MAGNESIUM: 1.8 mg/dL (ref 1.7–2.4)

## 2016-06-02 MED ORDER — FAT EMULSION 20 % IV EMUL
240.0000 mL | INTRAVENOUS | Status: DC
  Filled 2016-06-02: qty 250

## 2016-06-02 MED ORDER — FAT EMULSION 20 % IV EMUL
240.0000 mL | INTRAVENOUS | Status: AC
  Administered 2016-06-02: 240 mL via INTRAVENOUS
  Filled 2016-06-02: qty 250

## 2016-06-02 MED ORDER — TRACE MINERALS CR-CU-MN-SE-ZN 10-1000-500-60 MCG/ML IV SOLN
INTRAVENOUS | Status: DC
  Filled 2016-06-02 (×2): qty 1680

## 2016-06-02 MED ORDER — TRACE MINERALS CR-CU-MN-SE-ZN 10-1000-500-60 MCG/ML IV SOLN
INTRAVENOUS | Status: AC
  Administered 2016-06-02: 18:00:00 via INTRAVENOUS
  Filled 2016-06-02: qty 1680

## 2016-06-02 MED ORDER — LIDOCAINE VISCOUS 2 % MT SOLN
15.0000 mL | OROMUCOSAL | Status: DC | PRN
  Administered 2016-06-02 – 2016-06-08 (×2): 15 mL via OROMUCOSAL
  Filled 2016-06-02 (×4): qty 15

## 2016-06-02 MED ORDER — POTASSIUM CHLORIDE IN NACL 40-0.9 MEQ/L-% IV SOLN
INTRAVENOUS | Status: DC
  Administered 2016-06-02: 30 mL/h via INTRAVENOUS
  Filled 2016-06-02 (×2): qty 1000

## 2016-06-02 NOTE — Telephone Encounter (Signed)
Spoke with patient dtr re 1/5 appointments.

## 2016-06-02 NOTE — Progress Notes (Signed)
PROGRESS NOTE    Audrey Garza  [id number] DOB: 1940-07-07 DOA: 05/20/2016 PCP: No primary care provider on file.    Brief Narrative:  75 year old female with a history of metastatic ovarian cancer presented with progressive abdominal bloating and pain for 3 weeks duration. She was feeling well until the first week of December, 2017 when she was sick with a virus from which she never recovered. She states that she began feeling distended in the abdomen and uncomfortable and was taken to the ED on 05/20/16. The patient reports being diagnosed with stage III ovarian cancer in January, 2017. She received primary debulking surgery followed by 8 adjuvant cycles of carboplatin and paclitaxel followed by an additional 4 cycles of what she describes as maintenance carboplatin, paclitaxel and avastin. Her last dose of therapy was in September 2017   CT chest/abd/pelvis9-21-17 reportedly had no evidence of malignancy, with some bullous emphysema, tortuous sigmoid colon with diverticulosis. . Recommendation was for maintenance avastin x 1 year, however patient then came to [facility] in 02-2016. She has not established with any physicians since coming to [place]. CT AP 05-20-16 lias arge volume ascites, extensive peritoneal disease with omental thickening, trace bilateral pleural effusions, left colonic diverticulosis, bilateral inguinal hernias containing fat, post hysterectomy, no adnexal masses, no adenopathy. US paracentesis 05-22-16 for 2.9 liters of yellow fluid. 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 Unfortunately, after paracentesis she began having increasing abdominal pain. Abdominal x-ray revealed distended small bowel loops concerning for small bowel ejection. Gen. surgery was consulted. NG was inserted to suction.   Assessment & Plan:   Active Problems:   Malignant ascites   Malnutrition of moderate degree   Malignant neoplasm of ovary (HCC)   SBO (small bowel  obstruction)   Gastroesophageal reflux disease   N&V (nausea and vomiting)   Impaired nasal gastric feeding tube   Ileus (HCC)   Metastatic ovarian cancer with extensive peritoneal disease with omental thickening with large ascites: -Family brought in prior medical record, but in [place]. -appreciate Drs. Marko Plume and Denman George - Progressive within 4 months of last systemic treatment, which seems to have been taxol + avastin 02-02-16 -rapidly symptomatic with malignant ascites and peritoneal carcinomatosis.  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 Denman George--- not a candidate for interval debulking surgery due to the platinum resistant nature of her disease--if she fails advancement of diet, she should be considered for palliative G tube (discussed with surgery) - Per oncology chemotherapy starting 05/30/2016 - next chemotherapy on 1/5 (per daughter report) - unclear patient's prognosis  Small Bowel Obstruction -05/23/2016 abdominal x-ray multiple dilated loops of bowel concerning for small bowel obstruction -Likely due to the extensive peritoneal carcinomatosis -12/19--NG tube inserted to suction -Patient now passing flatus and has had small bowel movements -12/21 Abdxray--persistent distended loops of smbowel c/w SBO - will be allowed to have small sips of fluids and if tolerates can try taking NG off suction or clamping it - Plain films of the abdomen done after clamping of NGT showed some distention of the small bowel consistent with recurrent small bowel obstruction - NGT still with significant output (567ml in last 24 hours) - General surgery to follow at distance - TPN started - d/c IVF with D5 and transition to NS with 55mEq of KCl to decrease risk of refeeding syndrome - patient can attempt  to  drink liquids (either with or without clamping NGT)  Mild hyponatremia:  -Secondary to volume depletion -Improved with hydration - slightly decreased this am - will continue to monitor - BMP ordered for am  Mild thrombocytosis: -likely reactive    DVT prophylaxis: Lovenox Code Status: Full Family Communication: Updated patients daughter who is bedside. Disposition Plan: unclear disposition at this time- patient SBO not resolving and she needs nutrition.  Patient currently is Medicaid potential but does not have insurance so even if she improved to be be able to discharge home with or without NGT cost of care, TPN (if needed) and follow up are a concern.    Consultants:   GYN oncology Dr. Denman George 05/23/2016  Gen. surgery Dr.Toth III 05/23/2016  Oncology: Dr. Marko Plume 05/23/2016  Procedures:   CT abdomen and pelvis 05/20/2016  Abdominal x-rays 05/23/2016, 05/24/2016, 05/25/2016, 05/27/2016, 05/28/2016, 05/30/2016, 05/28/2016, 05/29/2016  Right IJ single-lumen power port catheter insertion per Dr. Annamaria Boots 05/24/2016  2-D echo 05/24/2016  Ultrasound-guided paracentesis--2.9 L of clear yellow fluid removed--Dr. Earleen Newport 05/22/2016 cytology positive for adenocarcinoma  Antimicrobials:   IV Ancef 05/24/16 x 1 dose    Subjective: Patient laying in bed and states she has been having fairly significant pain but does not want to take pain medication secondary to concern of worsening bowel function.  Would like to try to drink juice with NGT suction off.  Daughter states patient wants to know when her pain will improve and when her bloating will improve.  Patient understands current plan.     Objective: Vitals:   06/01/16 1525 06/01/16 2011 06/02/16 0419 06/02/16 1310  BP: (!) 148/74 (!) 144/80 (!) 142/92 (!) 155/74  Pulse: 95 93 96 99  Resp: 16 16 14 16   Temp: 98.2 F (36.8 C) 98.7 F (37.1 C) 98.8 F (37.1 C) 98.1 F (36.7 C)  TempSrc: Oral Oral Oral Oral  SpO2: 95%  97% 97% 96%  Weight:      Height:        Intake/Output Summary (Last 24 hours) at 06/02/16 1449 Last data filed at 06/01/16 1830  Gross per 24 hour  Intake           404.16 ml  Output              725 ml  Net          -320.84 ml   Filed Weights   05/20/16 1332 05/20/16 1818  Weight: 78 kg (171 lb 15.3 oz) 80.2 kg (176 lb 12.9 oz)    Examination:  General exam: Appears calm and comfortable  Respiratory system: Clear to auscultation. Respiratory effort normal. Cardiovascular system: S1 & S2 heard, RRR. No JVD, murmurs, rubs, gallops or clicks. No pedal edema. Gastrointestinal system: Abdomen is distended, soft and minimally diffusely tender. Minimal Bowel sounds heard. Central nervous system: Alert and oriented. No focal neurological deficits. Extremities: Symmetric 4 x 5 power. Skin: No rashes, lesions or ulcers Psychiatry: Judgement appears normal. Unclear if patient understands prognosis. Mood & affect appropriate.     Data Reviewed: I have personally reviewed following labs and imaging studies  CBC:  Recent Labs Lab 05/27/16 1040 05/28/16 1007 05/29/16 1009 05/30/16 1200 05/31/16 1741 06/01/16 0416  WBC 8.0 7.0 9.6 10.1 10.0 10.6*  NEUTROABS 5.1 4.7  --  7.3  --  7.5  HGB 12.5 11.5* 12.7 12.6 12.0 11.8*  HCT 37.9 34.6* 38.1 37.5 36.0 35.8*  MCV 87.9 87.2 87.0 87.8 87.8 88.2  PLT 498* 429*  458* 445* 437* 123456*   Basic Metabolic Panel:  Recent Labs Lab 05/28/16 1007 05/29/16 1009 05/30/16 1200 05/31/16 1741 06/01/16 0416 06/02/16 0435  NA 134* 138 137 139 137 132*  K 3.3* 3.8 3.9 3.5 3.8 3.7  CL 100* 105 106 108 105 102  CO2 25 25 25 24 26 25   GLUCOSE 147* 136* 134* 147* 136* 170*  BUN 9 6 6 6 8 14   CREATININE 0.77 0.74 0.76 0.48 0.56 0.52  CALCIUM 8.0* 8.3* 8.1* 8.1* 7.9* 7.9*  MG 1.8 2.4  --  1.8 1.9 1.8  PHOS  --   --   --  3.3 3.5 3.2   GFR: Estimated Creatinine Clearance: 60.9 mL/min (by C-G formula based on SCr of 0.52 mg/dL). Liver  Function Tests:  Recent Labs Lab 05/30/16 1200 05/31/16 1741 06/01/16 0416 06/02/16 0435  AST 13* 11* 12* 13*  ALT 10* 7* 10* 8*  ALKPHOS 62 62 61 64  BILITOT 0.3 0.6 0.6 0.4  PROT 5.6* 5.5* 5.2* 5.2*  ALBUMIN 2.8* 2.4* 2.3* 2.4*   No results for input(s): LIPASE, AMYLASE in the last 168 hours. No results for input(s): AMMONIA in the last 168 hours. Coagulation Profile: No results for input(s): INR, PROTIME in the last 168 hours. Cardiac Enzymes: No results for input(s): CKTOTAL, CKMB, CKMBINDEX, TROPONINI in the last 168 hours. BNP (last 3 results) No results for input(s): PROBNP in the last 8760 hours. HbA1C: No results for input(s): HGBA1C in the last 72 hours. CBG:  Recent Labs Lab 06/01/16 1216 06/01/16 1834 06/01/16 2336 06/02/16 0611 06/02/16 1159  GLUCAP 140* 152* 152* 139* 147*   Lipid Profile:  Recent Labs  06/01/16 0416  TRIG 125   Thyroid Function Tests: No results for input(s): TSH, T4TOTAL, FREET4, T3FREE, THYROIDAB in the last 72 hours. Anemia Panel: No results for input(s): VITAMINB12, FOLATE, FERRITIN, TIBC, IRON, RETICCTPCT in the last 72 hours. Sepsis Labs: No results for input(s): PROCALCITON, LATICACIDVEN in the last 168 hours.  Recent Results (from the past 240 hour(s))  Urine culture     Status: Abnormal   Collection Time: 05/25/16 10:48 PM  Result Value Ref Range Status   Specimen Description URINE, CLEAN CATCH  Final   Special Requests NONE  Final   Culture (A)  Final    <10,000 COLONIES/mL INSIGNIFICANT GROWTH Performed at [facility]    Report Status 05/27/2016 FINAL  Final         Radiology Studies: No results found.      Scheduled Meds: . enoxaparin (LOVENOX) injection  40 mg Subcutaneous Q24H  . feeding supplement  1 Container Oral QID  . insulin aspart  0-9 Units Subcutaneous Q6H  . pantoprazole (PROTONIX) IV  40 mg Intravenous Q24H   Continuous Infusions: . 0.9 % NaCl with KCl 20 mEq / L 40 mL/hr  at 06/01/16 1830  . 0.9 % NaCl with KCl 40 mEq / L    . Marland KitchenTPN (CLINIMIX-E) Adult 60 mL/hr at 06/01/16 1713   And  . fat emulsion 240 mL (06/01/16 1713)  . Marland KitchenTPN (CLINIMIX-E) Adult     And  . fat emulsion       LOS: 12 days    Time spent: 30 minutes    Loretha Stapler, MD [facility] Pager [phone]  If 7PM-7AM, please contact night-coverage [email or web address] Password [facility] 06/02/2016, 2:49 PM

## 2016-06-02 NOTE — Progress Notes (Signed)
PHARMACY - ADULT TOTAL PARENTERAL NUTRITION CONSULT NOTE   Pharmacy Consult for TPN Indication: partial small bowel obstruction  Patient Measurements: Height: 5\' 3"  (160 cm) Weight: 176 lb 12.9 oz (80.2 kg) IBW/kg (Calculated) : 52.4 TPN AdjBW (KG): 58.8 Body mass index is 31.32 kg/m.  Insulin Requirements: 5 units Novolog in the past 24hr  Current Nutrition: NPO  IVF: NS with KCl 49meq/L at 40 ml/hr  Central access: implanted port TPN start date: 12/27  ASSESSMENT                                                                                                          HPI: 85 yoF with h/o metastatic ovarian cancer with extensive peritoneal disease with omental thickening with large malignant ascites and partial SBO likely 2/2 metastatic disease. Per MD note, has been 12 days since she has been able to take anything PO. She has NGT to LIWS and has failed multiple attempts to d/c NG and advance diet.  TPN to start 12/27.  Significant events:  12/26 D1C1 Doxil 40mg /m2 - tolerated well 12/27 TPN started 12/28 May small sips of fluids and if tolerates   Today, 06/02/2016:    Glucose - no h/o DM, CBG range 139-153  Electrolytes - Na low at 132, others WNL including corrected Ca 9.18  Renal - SCr WNL, CrCl 60 ml/min  Patient at risk of refeeding syndrome given NPO status for ~10 days; of note, she was unable to tolerate IV KCl runs on 12/28 due to burning at IV site, therefore, if K+ replacement is needed, will need to increase in IVF or attempt to use orally.  LFTs - low  TGs - 125 (12/28)  Prealbumin - <5 (12/28)  NGT - 275cc documented in past 24hr  NUTRITIONAL GOALS                                                                                             RD recs (12/26): 1750-1950 kcal, 75-85g protein, 1.7-1.9L/day fluid Clinimix E 5/20 at a goal rate of 70 ml/hr + 20% fat emulsion at 10 ml/hr to provide: 84 g/day protein, 1958 Kcal/day.  (+++There is currently a  Psychologist, prison and probation services of Clinimix solution.  Pharmacy will use Clinimix product based on what we have available in stock+++)  PLAN  At 1800 today:  Increase Clinimix E 5/20 to goal 70 ml/hr.  20% fat emulsion at 10 ml/hr.  TPN to contain standard multivitamins and trace elements.  IVF: Decrease NS to 61ml/hr and increase KCl to 96meq KCl per liter.  Continue sensitive SSI and CBG checks q6h.   TPN lab panels on Mondays & Thursdays.  F/u daily.  Peggyann Juba, PharmD, BCPS Pager: [phone] 06/02/2016,7:20 AM

## 2016-06-02 NOTE — Progress Notes (Signed)
MEDICAL ONCOLOGY June 02, 2016, 3:43 PM  Hospital day 14 Antibiotics: none Chemotherapy: first doxil 05-30-16 TNA begun 05-31-16   EMR reviewed. Spoke with Dr Adair Patter on unit. Patient seen with daughter here.   Subjective: Abdomen more uncomfortable, with lower cramping pain bilaterally when bowels move, loose today x 3. Too uncomfortable to walk in hall, has been up to BR.  Has not used prn IV pain meds, encouraged now. NG very uncomfortable left side of throat. No nausea or vomiting. No problems with PAC.  Oncologic History Patient had hysterectomy without oophorectomy in her 62s for benign indication, and been in her usual good health until July 2016, when she developed abdominal discomfort and bloating. In late 03-2015 she was found to have large pelvic mass and perihepatic ascites. CA 125 then was 359, with CEA of 3, CA 19-9 5.8 and alpha fetoprotein 2.3. She had exploratory laparotomy 04-19-15, which was not done by gyn oncologist, with bilateral ovaried an tumors, 10 cm with capsule ruptured on right , smaller on left, peritoneal carcinomatosis. Washings were done, however accompanying information does not describe extent of debulking. She had some bowel obstruction post operatively, with NG used ~ POD 3. Pathology available 05-05-15 showed cystic and solid left ovarian mass, 13 cm right ovarian mass, moderately differentiated tumor "tumor de celulas de la granulosa". 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 CT AP 122016 had ascites, carcinomatosis and multiple peritoneal implants. CA 125 on 05-24-15 baseline for chemo was 141. She received 3 cycles of taxol + carboplatin from 05-26-15 thru 07-07-15 (not clear if avastin given those cycles), after which carboplatin was not available in [place]. She received taxol 175 mg/m2, CDDP and avastin for 3 additional cycles thru 10-07-15. Second look laparotomy was  discussed, patient declined, and instead had 4 cycles avastin + taxol from 12-01-15 thru 02-02-16 (family is confirming, but we believe no CDDP after 10-07-15 and no carboplatin after 07-07-15). CT CAP 02-24-16 reportedly had no evidence of malignancy, with some bullous emphysema, tortuous sigmoid colon with diverticulosis. . Recommendation was for maintenance avastin x 1 year, however patient then came to [facility] in 02-2016. She has not established with any physicians since coming to [place]. CT AP 05-20-16 lias arge volume ascites, extensive peritoneal disease with omental thickening, trace bilateral pleural effusions, left colonic diverticulosis, bilateral inguinal hernias containing fat, post hysterectomy, no adnexal masses, no adenopathy. US paracentesis 05-22-16 for 2.9 liters of yellow fluid. Cytology adenocarcinoma withserous features, immunohistochemical stains confirm gyn. First doxil given 05-30-16, chosen due to side effect profile but note may still be platinum sensitive.    Objective: Vital signs in last 24 hours: Blood pressure (!) 155/74, pulse 99, temperature 98.1 F (36.7 C), temperature source Oral, resp. rate 16, height 5\' 3"  (1.6 m), weight 176 lb 12.9 oz (80.2 kg), SpO2 96 %.   Intake/Output from previous day: 12/28 0701 - 12/29 0700 In: 404.2 [I.V.:404.2] Out: 725 [Urine:450; Emesis/NG output:275] Intake/Output this shift: No intake/output data recorded.  Physical exam: awake, alert, looks uncomfortable supine in bed with NG to suction. Oral mucosa without ulcerations, posterior pharynx clear. Lung clear anteriorly and laterally. PAC site ok. NSL RUE. Heart RRR, no gallop. Abdomen at least as distended or slightly moreso than at my exam on 12-28, a few BS in lower quadrants, slightly tender thruout. LE no edema, cords. Feet warm. Moves all extremities.    Lab Results:  Recent Labs  05/31/16 1741 06/01/16 0416  WBC 10.0 10.6*  HGB 12.0 11.8*  HCT 36.0 35.8*  PLT 437* 411*    BMET  Recent Labs  06/01/16 0416 06/02/16 0435  NA 137 132*  K 3.8 3.7  CL 105 102  CO2 26 25  GLUCOSE 136* 170*  BUN 8 14  CREATININE 0.56 0.52  CALCIUM 7.9* 7.9*    Studies/Results: No results found.  DISCUSSION Partial SBO: Bowel rest with TNA as nutrition support to see if any improvement from doxil given 05-30-16 - tho do not expect any rapid improvement. Chemo given in attempt to improve situation, tho not situation of possible cure. She may NOT be platinum resistant based on my review of information from [place] with interpreter. TNA is short term intervention to  Poor candidate for surgical intervention for bowel obstruction due to extent of carcinomatosis.   Note has extensive diverticular disease by CT.     Assessment/Plan: 1.advanced ovarian carcinoma: Bilateral ovarian by history, appears high grade serous, IIIC. Progressive within 4 months of taxol + avastin (last 02-02-16); appears thatlast CDDP was 10-2015 andlast carboplatin was 07-07-15, so may not be technically platinum resistant.. Rapidly symptomatic with malignant ascites and peritoneal carcinomatosis in 2 weeks PTA. Post paracentesis 2.9 liters on 05-22-16, no significant ascites by [place] 12-24. . First doxil given 05-30-16 (see above) tho any improvement in carcinomatosis likely not rapid.  Even with this history, she may get significant improvement/ some period of control of diseasefrom additional systemic treatment; she has beenin very good condition otherwise, tho will need nutrition to maintain this. Short term TNA begun 05-21-16 Palliative Care support in hospital may be useful.  I have encouraged her to try the IV pain meds, and added viscous lidocaine for throat and Kpad. 2. Small bowel obstruction: partial, reoccurred within 24 hours when NG removed. Bowel rest with TNA for now. Concern that there may be multiple areas of obstruction with extensive carcinomatosis, continuing conservative management  to see if improvement with chemo given this week. G tube may be better tolerated for palliation than NG if obstruction does not improve.  3. marked diverticulosis by CT, without evidence diverticulitis then. ? If this is contributing to lower abdominal pain. Note loose stools today, would not be expected with doxil and has not been on antibiotics, would still check for C diff if ongoing. 4.small bilateral pleural effusions by CXR likely related to recurrent gyn cancer. Not symptomatic now. 5.inguinal hernias containing only fat 6.aortic atherosclerosis by CT 7.social situation: recently moved from [place] to be with daughters in [place], family very supportive. Patient speaks Spanish. No insurance tho may be medicaid eligible.  8. Nutrition: poor po intake beginning 2-3 days prior to admission, still NPO with SBO. Consider short term TNA to support chemo just given / possible surgical intervention  9.PAC placed by IR 10. Echocardiogram with good EF 05-25-16,  for doxil 11. flu vaccine done 05-28-16  Please page or call between my rounds if I can help  Cell [phone], pager [place]  Evlyn Clines MD

## 2016-06-02 NOTE — Progress Notes (Signed)
CM following along for DC needs. Pt is still with SBO, NGT and TPN. Unlikely for discharge soon. Pt is without insurance and has very supportive family. CM will continue to follow. Marney Doctor RN,BSN,NCM [phone]

## 2016-06-03 LAB — COMPREHENSIVE METABOLIC PANEL
ALBUMIN: 2.3 g/dL — AB (ref 3.5–5.0)
ALT: 7 U/L — ABNORMAL LOW (ref 14–54)
ANION GAP: 6 (ref 5–15)
AST: 12 U/L — ABNORMAL LOW (ref 15–41)
Alkaline Phosphatase: 73 U/L (ref 38–126)
BILIRUBIN TOTAL: 0.4 mg/dL (ref 0.3–1.2)
BUN: 16 mg/dL (ref 6–20)
CALCIUM: 8.1 mg/dL — AB (ref 8.9–10.3)
CO2: 26 mmol/L (ref 22–32)
Chloride: 100 mmol/L — ABNORMAL LOW (ref 101–111)
Creatinine, Ser: 0.55 mg/dL (ref 0.44–1.00)
GFR calc non Af Amer: >60 mL/min (ref >60)
GLUCOSE: 163 mg/dL — AB (ref 65–99)
POTASSIUM: 4.4 mmol/L (ref 3.5–5.1)
SODIUM: 132 mmol/L — AB (ref 135–145)
TOTAL PROTEIN: 5.5 g/dL — AB (ref 6.5–8.1)

## 2016-06-03 LAB — GLUCOSE, CAPILLARY
GLUCOSE-CAPILLARY: 145 mg/dL — AB (ref 65–99)
GLUCOSE-CAPILLARY: 152 mg/dL — AB (ref 65–99)
Glucose-Capillary: 134 mg/dL — ABNORMAL HIGH (ref 65–99)
Glucose-Capillary: 169 mg/dL — ABNORMAL HIGH (ref 65–99)
Glucose-Capillary: 123 mg/dL — ABNORMAL HIGH (ref 65–99)

## 2016-06-03 LAB — MAGNESIUM: Magnesium: 1.7 mg/dL (ref 1.7–2.4)

## 2016-06-03 LAB — PHOSPHORUS: PHOSPHORUS: 3.2 mg/dL (ref 2.5–4.6)

## 2016-06-03 MED ORDER — POTASSIUM CHLORIDE IN NACL 20-0.9 MEQ/L-% IV SOLN
INTRAVENOUS | Status: DC
  Administered 2016-06-03: 11:00:00 via INTRAVENOUS
  Filled 2016-06-03: qty 1000

## 2016-06-03 MED ORDER — MAGNESIUM SULFATE IN D5W 1-5 GM/100ML-% IV SOLN
1.0000 g | Freq: Once | INTRAVENOUS | Status: AC
  Administered 2016-06-03: 1 g via INTRAVENOUS
  Filled 2016-06-03: qty 100

## 2016-06-03 MED ORDER — FAT EMULSION 20 % IV EMUL
240.0000 mL | INTRAVENOUS | Status: AC
  Administered 2016-06-03: 240 mL via INTRAVENOUS
  Filled 2016-06-03: qty 250

## 2016-06-03 MED ORDER — HYDROMORPHONE HCL 2 MG/ML IJ SOLN
0.5000 mg | INTRAMUSCULAR | Status: DC | PRN
  Administered 2016-06-04 (×3): 0.5 mg via INTRAVENOUS
  Filled 2016-06-03 (×3): qty 1

## 2016-06-03 MED ORDER — TRACE MINERALS CR-CU-MN-SE-ZN 10-1000-500-60 MCG/ML IV SOLN
INTRAVENOUS | Status: AC
  Administered 2016-06-03: 19:00:00 via INTRAVENOUS
  Filled 2016-06-03: qty 1680

## 2016-06-03 MED ORDER — INSULIN ASPART 100 UNIT/ML ~~LOC~~ SOLN
0.0000 [IU] | SUBCUTANEOUS | Status: DC
  Administered 2016-06-03: 1 [IU] via SUBCUTANEOUS
  Administered 2016-06-03: 2 [IU] via SUBCUTANEOUS
  Administered 2016-06-03 – 2016-06-05 (×11): 1 [IU] via SUBCUTANEOUS

## 2016-06-03 MED ORDER — HYDROMORPHONE HCL 2 MG/ML IJ SOLN
1.0000 mg | INTRAMUSCULAR | Status: DC | PRN
  Administered 2016-06-03: 1 mg via INTRAVENOUS
  Filled 2016-06-03: qty 1

## 2016-06-03 MED ORDER — KETOROLAC TROMETHAMINE 30 MG/ML IJ SOLN
30.0000 mg | Freq: Once | INTRAMUSCULAR | Status: AC
  Administered 2016-06-03: 30 mg via INTRAVENOUS
  Filled 2016-06-03: qty 1

## 2016-06-03 NOTE — Progress Notes (Signed)
PHARMACY - ADULT TOTAL PARENTERAL NUTRITION CONSULT NOTE   Pharmacy Consult for TPN Indication: partial small bowel obstruction  Patient Measurements: Height: 5\' 3"  (160 cm) Weight: 176 lb 12.9 oz (80.2 kg) IBW/kg (Calculated) : 52.4 TPN AdjBW (KG): 58.8 Body mass index is 31.32 kg/m.  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 Per MD note, has been 12 days since she has been able to take anything PO. She has NGT to LIWS and has failed multiple attempts to d/c NG and advance diet.  TPN to start 12/27.  Significant events:  12/26 D1C1 Doxil 40mg /m2 - tolerated well 12/27 TPN started 12/28 May allow small sips of fluids  Today, 06/03/2016:    Glucose - no h/o DM, CBG range 134-176, will adjust SSI today  Electrolytes - Na low at 132, Cl 100, others WNL including corrected Ca. Mag at lower limit of normal.  Renal - SCr WNL, CrCl 60 ml/min  LFTs - low  TGs - 125 (12/28)  Prealbumin - <5 (12/28)  NGT - 850cc documented in past 24hr  NUTRITIONAL GOALS                                                                                             RD recs (12/26): 1750-1950 kcal, 75-85g protein, 1.7-1.9L/day fluid Clinimix E 5/20 at a goal rate of 70 ml/hr + 20% fat emulsion at 10 ml/hr to provide: 84 g/day protein, 1958 Kcal/day.  (+++There is currently a Psychologist, prison and probation services of Clinimix solution.  Pharmacy will use Clinimix product based on what we have available in stock+++)  PLAN  At 1800 today:  Continue Clinimix E 5/20 at goal 70 ml/hr.  20% fat emulsion at 10 ml/hr.  TPN to contain standard multivitamins and trace elements.  Magnesium sulfate 1gm IV x 1  IVF: Adjust KCl in IVF to 89mEq KCl per liter (from 40 mEq) since the mEq K+ provided in TPN appears to be adequate based on today's BMET.  Adjust sensitive SSI and CBG checks to q4h.   TPN lab panels on Mondays & Thursdays.  F/u daily. Check Mag, Phos, BMET in AM.  Hershal Coria, PharmD, BCPS Pager: [phone] 06/03/2016 8:11 AM

## 2016-06-03 NOTE — Progress Notes (Signed)
PROGRESS NOTE    Audrey Garza  [id number] DOB: 1940-09-06 DOA: 05/20/2016 PCP: No primary care provider on file.    Brief Narrative:  75 year old female with a history of metastatic ovarian cancer presented with progressive abdominal bloating and pain for 3 weeks duration. She was feeling well until the first week of December, 2017 when she was sick with a virus from which she never recovered. She states that she began feeling distended in the abdomen and uncomfortable and was taken to the ED on 05/20/16. The patient reports being diagnosed with stage III ovarian cancer in January, 2017. She received primary debulking surgery followed by 8 adjuvant cycles of carboplatin and paclitaxel followed by an additional 4 cycles of what she describes as maintenance carboplatin, paclitaxel and avastin. Her last dose of therapy was in September 2017   CT chest/abd/pelvis9-21-17 reportedly had no evidence of malignancy, with some bullous emphysema, tortuous sigmoid colon with diverticulosis. . Recommendation was for maintenance avastin x 1 year, however patient then came to [facility] in 02-2016. She has not established with any physicians since coming to [place]. CT AP 05-20-16 shows large volume ascites, extensive peritoneal disease with omental thickening, trace bilateral pleural effusions, left colonic diverticulosis, bilateral inguinal hernias containing fat, post hysterectomy, no adnexal masses, no adenopathy. US paracentesis 05-22-16 revealed 2.9 liters of yellow fluid. 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 Unfortunately, after paracentesis she began having increasing abdominal pain. Abdominal x-ray revealed distended small bowel loops concerning for small bowel ejection. Gen. surgery was consulted. NG was inserted to suction.  Currently she is NPO, on NG tube suction, hoping for resolution of SBO.    Assessment & Plan:   Active Problems:   Malignant ascites   Malnutrition  of moderate degree   Malignant neoplasm of ovary (HCC)   SBO (small bowel obstruction)   Gastroesophageal reflux disease   N&V (nausea and vomiting)   Impaired nasal gastric feeding tube   Ileus (HCC)   Metastatic ovarian cancer with extensive peritoneal disease with omental thickening with large ascites: -Family brought in prior medical records, but in [place]. -appreciate Drs. Livesay and Rossi's recommendations.  - Progressive within 4 months of last systemic treatment, which seems to have been taxol + avastin 02-02-16 -rapidly symptomatic with malignant ascites and peritoneal carcinomatosis.  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 Denman George--- not a candidate for interval debulking surgery due to the platinum resistant nature of her disease--if she fails advancement of diet, she should be considered for palliative G tube (discussed with surgery) - unclear patient's prognosis - symptomatic management with pain control .   Small Bowel Obstruction -05/23/2016 abdominal x-ray multiple dilated loops of bowel concerning for small bowel obstruction -Likely due to the extensive peritoneal carcinomatosis - recurrent SBO, if re occurs, she might need G tube.  - will be allowed to have small sips of fluids and if tolerates can try taking NG off suction or clamping it - TPN started - d/c IVF with D5 and transition to NS with 21mEq of KCl to decrease risk of refeeding syndrome - patient can attempt to drink liquids (either with or without clamping NGT)  Mild hyponatremia:  -Secondary to volume depletion -Improved with hydration Monitor prn.   Mild thrombocytosis: -likely reactive    DVT prophylaxis: Lovenox Code Status: Full Family Communication: Updated patients daughter  who is  bedside. Disposition Plan: pending further management.     Consultants:   GYN oncology Dr. Denman George 05/23/2016  Gen. surgery Dr.Toth III 05/23/2016  Oncology: Dr. Marko Plume 05/23/2016  Procedures:   CT abdomen and pelvis 05/20/2016  Abdominal x-rays 05/23/2016, 05/24/2016, 05/25/2016, 05/27/2016, 05/28/2016, 05/30/2016, 05/28/2016, 05/29/2016  Right IJ single-lumen power port catheter insertion per Dr. Annamaria Boots 05/24/2016  2-D echo 05/24/2016  Ultrasound-guided paracentesis--2.9 L of clear yellow fluid removed--Dr. Earleen Newport 05/22/2016 cytology positive for adenocarcinoma  Antimicrobials:   IV Ancef 05/24/16 x 1 dose    Subjective: Patient reports pain when swallowing, because of reinserting the NG tube.   Objective: Vitals:   06/02/16 1310 06/02/16 2128 06/03/16 0604 06/03/16 1445  BP: (!) 155/74 (!) 148/83 (!) 145/81 128/74  Pulse: 99 84 97 91  Resp: 16 16 16 16   Temp: 98.1 F (36.7 C) 98 F (36.7 C) 98.9 F (37.2 C) 98.7 F (37.1 C)  TempSrc: Oral Oral Oral Oral  SpO2: 96% 98% 95% 95%  Weight:      Height:        Intake/Output Summary (Last 24 hours) at 06/03/16 1724 Last data filed at 06/03/16 1027  Gross per 24 hour  Intake             1660 ml  Output             1380 ml  Net              280 ml   Filed Weights   05/20/16 1332 05/20/16 1818  Weight: 78 kg (171 lb 15.3 oz) 80.2 kg (176 lb 12.9 oz)    Examination:  General exam: Appears uncomfortable.  Respiratory system: Clear to auscultation. Respiratory effort normal. Cardiovascular system: S1 & S2 heard, RRR. No JVD, murmurs, rubs, gallops or clicks. No pedal edema. Gastrointestinal system: Abdomen is distended, soft and minimally diffusely tender. Minimal Bowel sounds heard. Central nervous system: Alert and oriented. No focal neurological deficits. Extremities: Symmetric 4 x 5 power. Skin: No rashes, lesions or ulcers     Data Reviewed: I have personally reviewed following labs and imaging  studies  CBC:  Recent Labs Lab 05/28/16 1007 05/29/16 1009 05/30/16 1200 05/31/16 1741 06/01/16 0416  WBC 7.0 9.6 10.1 10.0 10.6*  NEUTROABS 4.7  --  7.3  --  7.5  HGB 11.5* 12.7 12.6 12.0 11.8*  HCT 34.6* 38.1 37.5 36.0 35.8*  MCV 87.2 87.0 87.8 87.8 88.2  PLT 429* 458* 445* 437* 123456*   Basic Metabolic Panel:  Recent Labs Lab 05/29/16 1009 05/30/16 1200 05/31/16 1741 06/01/16 0416 06/02/16 0435 06/03/16 0532  NA 138 137 139 137 132* 132*  K 3.8 3.9 3.5 3.8 3.7 4.4  CL 105 106 108 105 102 100*  CO2 25 25 24 26 25 26   GLUCOSE 136* 134* 147* 136* 170* 163*  BUN 6 6 6 8 14 16   CREATININE 0.74 0.76 0.48 0.56 0.52 0.55  CALCIUM 8.3* 8.1* 8.1* 7.9* 7.9* 8.1*  MG 2.4  --  1.8 1.9 1.8 1.7  PHOS  --   --  3.3 3.5 3.2 3.2   GFR: Estimated Creatinine Clearance: 60.9 mL/min (by C-G formula based on SCr of 0.55 mg/dL). Liver Function Tests:  Recent Labs Lab 05/30/16 1200 05/31/16 1741 06/01/16 0416 06/02/16 0435 06/03/16 0532  AST 13* 11* 12* 13* 12*  ALT 10* 7* 10* 8* 7*  ALKPHOS 62 62 61 64 73  BILITOT 0.3 0.6 0.6 0.4 0.4  PROT  5.6* 5.5* 5.2* 5.2* 5.5*  ALBUMIN 2.8* 2.4* 2.3* 2.4* 2.3*   No results for input(s): LIPASE, AMYLASE in the last 168 hours. No results for input(s): AMMONIA in the last 168 hours. Coagulation Profile: No results for input(s): INR, PROTIME in the last 168 hours. Cardiac Enzymes: No results for input(s): CKTOTAL, CKMB, CKMBINDEX, TROPONINI in the last 168 hours. BNP (last 3 results) No results for input(s): PROBNP in the last 8760 hours. HbA1C: No results for input(s): HGBA1C in the last 72 hours. CBG:  Recent Labs Lab 06/02/16 1842 06/03/16 0023 06/03/16 0605 06/03/16 1204 06/03/16 1653  GLUCAP 176* 145* 134* 152* 169*   Lipid Profile:  Recent Labs  06/01/16 0416  TRIG 125   Thyroid Function Tests: No results for input(s): TSH, T4TOTAL, FREET4, T3FREE, THYROIDAB in the last 72 hours. Anemia Panel: No results for  input(s): VITAMINB12, FOLATE, FERRITIN, TIBC, IRON, RETICCTPCT in the last 72 hours. Sepsis Labs: No results for input(s): PROCALCITON, LATICACIDVEN in the last 168 hours.  Recent Results (from the past 240 hour(s))  Urine culture     Status: Abnormal   Collection Time: 05/25/16 10:48 PM  Result Value Ref Range Status   Specimen Description URINE, CLEAN CATCH  Final   Special Requests NONE  Final   Culture (A)  Final    <10,000 COLONIES/mL INSIGNIFICANT GROWTH Performed at [facility]    Report Status 05/27/2016 FINAL  Final         Radiology Studies: No results found.      Scheduled Meds: . enoxaparin (LOVENOX) injection  40 mg Subcutaneous Q24H  . feeding supplement  1 Container Oral QID  . insulin aspart  0-9 Units Subcutaneous Q4H  . pantoprazole (PROTONIX) IV  40 mg Intravenous Q24H   Continuous Infusions: . 0.9 % NaCl with KCl 20 mEq / L 30 mL/hr at 06/03/16 1032  . Marland KitchenTPN (CLINIMIX-E) Adult 70 mL/hr at 06/03/16 0600   And  . fat emulsion 240 mL (06/03/16 0600)  . Marland KitchenTPN (CLINIMIX-E) Adult     And  . fat emulsion       LOS: 13 days    Time spent: 69 minutes    Domonique Brouillard, MD [facility] Pager [phone]  If 7PM-7AM, please contact night-coverage [email or web address] Password [email or web address] 06/03/2016, 5:24 PM

## 2016-06-04 ENCOUNTER — Inpatient Hospital Stay (HOSPITAL_COMMUNITY): Payer: Medicaid Other

## 2016-06-04 DIAGNOSIS — B370 Candidal stomatitis: Secondary | ICD-10-CM

## 2016-06-04 DIAGNOSIS — K5790 Diverticulosis of intestine, part unspecified, without perforation or abscess without bleeding: Secondary | ICD-10-CM

## 2016-06-04 DIAGNOSIS — J918 Pleural effusion in other conditions classified elsewhere: Secondary | ICD-10-CM

## 2016-06-04 DIAGNOSIS — R140 Abdominal distension (gaseous): Secondary | ICD-10-CM

## 2016-06-04 DIAGNOSIS — I7090 Unspecified atherosclerosis: Secondary | ICD-10-CM

## 2016-06-04 DIAGNOSIS — Z4659 Encounter for fitting and adjustment of other gastrointestinal appliance and device: Secondary | ICD-10-CM

## 2016-06-04 DIAGNOSIS — R0602 Shortness of breath: Secondary | ICD-10-CM

## 2016-06-04 LAB — COMPREHENSIVE METABOLIC PANEL
ALK PHOS: 78 U/L (ref 38–126)
ALT: 8 U/L — ABNORMAL LOW (ref 14–54)
ANION GAP: 8 (ref 5–15)
AST: 14 U/L — ABNORMAL LOW (ref 15–41)
Albumin: 2.2 g/dL — ABNORMAL LOW (ref 3.5–5.0)
BILIRUBIN TOTAL: 0.5 mg/dL (ref 0.3–1.2)
BUN: 19 mg/dL (ref 6–20)
CALCIUM: 8 mg/dL — AB (ref 8.9–10.3)
CO2: 23 mmol/L (ref 22–32)
Chloride: 100 mmol/L — ABNORMAL LOW (ref 101–111)
Creatinine, Ser: 0.34 mg/dL — ABNORMAL LOW (ref 0.44–1.00)
Glucose, Bld: 123 mg/dL — ABNORMAL HIGH (ref 65–99)
POTASSIUM: 4.5 mmol/L (ref 3.5–5.1)
Sodium: 131 mmol/L — ABNORMAL LOW (ref 135–145)
Total Protein: 5.2 g/dL — ABNORMAL LOW (ref 6.5–8.1)

## 2016-06-04 LAB — GLUCOSE, CAPILLARY
GLUCOSE-CAPILLARY: 135 mg/dL — AB (ref 65–99)
Glucose-Capillary: 117 mg/dL — ABNORMAL HIGH (ref 65–99)
Glucose-Capillary: 122 mg/dL — ABNORMAL HIGH (ref 65–99)
Glucose-Capillary: 131 mg/dL — ABNORMAL HIGH (ref 65–99)
Glucose-Capillary: 137 mg/dL — ABNORMAL HIGH (ref 65–99)
Glucose-Capillary: 146 mg/dL — ABNORMAL HIGH (ref 65–99)

## 2016-06-04 LAB — MAGNESIUM: Magnesium: 1.8 mg/dL (ref 1.7–2.4)

## 2016-06-04 LAB — PHOSPHORUS: Phosphorus: 3.4 mg/dL (ref 2.5–4.6)

## 2016-06-04 MED ORDER — FAT EMULSION 20 % IV EMUL
240.0000 mL | INTRAVENOUS | Status: AC
  Administered 2016-06-04: 240 mL via INTRAVENOUS
  Filled 2016-06-04: qty 240

## 2016-06-04 MED ORDER — TRACE MINERALS CR-CU-MN-SE-ZN 10-1000-500-60 MCG/ML IV SOLN
INTRAVENOUS | Status: AC
  Administered 2016-06-04: 18:00:00 via INTRAVENOUS
  Filled 2016-06-04: qty 1680

## 2016-06-04 MED ORDER — KETOROLAC TROMETHAMINE 30 MG/ML IJ SOLN
30.0000 mg | Freq: Three times a day (TID) | INTRAMUSCULAR | Status: AC | PRN
  Administered 2016-06-04 – 2016-06-05 (×2): 30 mg via INTRAVENOUS
  Filled 2016-06-04 (×2): qty 1

## 2016-06-04 MED ORDER — MENTHOL 3 MG MT LOZG
1.0000 | LOZENGE | OROMUCOSAL | Status: DC | PRN
  Administered 2016-06-04: 3 mg via ORAL
  Filled 2016-06-04 (×2): qty 9

## 2016-06-04 MED ORDER — FLUCONAZOLE IN SODIUM CHLORIDE 100-0.9 MG/50ML-% IV SOLN
100.0000 mg | INTRAVENOUS | Status: AC
  Administered 2016-06-04 – 2016-06-10 (×7): 100 mg via INTRAVENOUS
  Filled 2016-06-04 (×9): qty 50

## 2016-06-04 MED ORDER — SODIUM CHLORIDE 0.9 % IV SOLN
INTRAVENOUS | Status: DC
Start: 2016-06-04 — End: 2016-06-06
  Administered 2016-06-04: 16:00:00 via INTRAVENOUS

## 2016-06-04 NOTE — Progress Notes (Addendum)
PHARMACY - ADULT TOTAL PARENTERAL NUTRITION CONSULT NOTE   Pharmacy Consult for TPN Indication: partial small bowel obstruction  Patient Measurements: Height: 5\' 3"  (160 cm) Weight: 176 lb 12.9 oz (80.2 kg) IBW/kg (Calculated) : 52.4 TPN AdjBW (KG): 58.8 Body mass index is 31.32 kg/m.  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 Per MD note, has been 12 days since she has been able to take anything PO. She has NGT to LIWS and has failed multiple attempts to d/c NG and advance diet.  TPN to start 12/27.  Significant events:  12/26 D1C1 Doxil 40mg /m2 - tolerated well 12/27 TPN started 12/28 May allow small sips of fluids  Today, 06/04/2016:    Glucose - no h/o DM, CBGs appear better controlled after adjusting SSI to q4h, had one high CBG at 169.  No adjustments today.  Electrolytes - Na remains low (131), Cl 100, others WNL including corrected Ca.   Renal - SCr WNL, CrCl 60 ml/min  LFTs - low  TGs - 125 (12/28)  Prealbumin - <5 (12/28)  NGT - 200cc output documented in past 24hr  NUTRITIONAL GOALS                                                                                             RD recs (12/26): 1750-1950 kcal, 75-85g protein, 1.7-1.9L/day fluid Clinimix E 5/20 at a goal rate of 70 ml/hr + 20% fat emulsion at 10 ml/hr to provide: 84 g/day protein, 1958 Kcal/day.  (+++There is currently a Psychologist, prison and probation services of Clinimix solution.  Pharmacy will use Clinimix product based on what we have available in stock+++)  PLAN  At 1800 today:  Continue Clinimix E 5/20 at goal 70 ml/hr.  20% fat emulsion at 10 ml/hr.  TPN to contain standard multivitamins and trace elements.  IVF: remove 11mEq KCl/L from IVF since the mEq K+ provided in TPN appears to be adequate, K+ WNL.  Continue sensitive SSI and CBG checks q4h.   TPN lab panels on Mondays & Thursdays.  F/u daily.   Hershal Coria, PharmD, BCPS Pager: [phone] 06/04/2016 10:39 AM

## 2016-06-04 NOTE — Progress Notes (Signed)
PROGRESS NOTE    Audrey Garza  [id number] DOB: 04-16-1941 DOA: 05/20/2016 PCP: No primary care provider on file.    Brief Narrative:  75 year old female with a history of metastatic ovarian cancer presented with progressive abdominal bloating and pain for 3 weeks duration. She was feeling well until the first week of December, 2017 when she was sick with a virus from which she never recovered. She states that she began feeling distended in the abdomen and uncomfortable and was taken to the ED on 05/20/16. The patient reports being diagnosed with stage III ovarian cancer in January, 2017. She received primary debulking surgery followed by 8 adjuvant cycles of carboplatin and paclitaxel followed by an additional 4 cycles of what she describes as maintenance carboplatin, paclitaxel and avastin. Her last dose of therapy was in September 2017   CT chest/abd/pelvis9-21-17 reportedly had no evidence of malignancy, with some bullous emphysema, tortuous sigmoid colon with diverticulosis. . Recommendation was for maintenance avastin x 1 year, however patient then came to [facility] in 02-2016. She has not established with any physicians since coming to [place]. CT AP 05-20-16 shows large volume ascites, extensive peritoneal disease with omental thickening, trace bilateral pleural effusions, left colonic diverticulosis, bilateral inguinal hernias containing fat, post hysterectomy, no adnexal masses, no adenopathy. US paracentesis 05-22-16 revealed 2.9 liters of yellow fluid. 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 Unfortunately, after paracentesis she began having increasing abdominal pain. Abdominal x-ray revealed distended small bowel loops concerning for small bowel ejection. Gen. surgery was consulted. NG was inserted to suction.  Currently she is NPO, on NG tube suction, hoping for resolution of SBO.     Assessment & Plan:   Active Problems:   Malignant ascites  Malnutrition of moderate degree   Malignant neoplasm of ovary (HCC)   SBO (small bowel obstruction)   Gastroesophageal reflux disease   N&V (nausea and vomiting)   Impaired nasal gastric feeding tube   Ileus (HCC)   Metastatic ovarian cancer with extensive peritoneal disease with omental thickening with large ascites: -appreciate Drs. Livesay and Rossi's recommendations.  - Progressive within 4 months of last systemic treatment, she has been taxol + avastin 02-02-16 -rapidly symptomatic with malignant ascites and peritoneal carcinomatosis.  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 Denman George--- not a candidate for interval debulking surgery due to the platinum resistant nature of her disease--if she fails advancement of diet, she should be considered for palliative G tube (discussed with surgery) - unclear patient's prognosis - symptomatic management with pain control .   Small Bowel Obstruction -05/23/2016 abdominal x-ray multiple dilated loops of bowel concerning for small bowel obstruction -Likely due to the extensive peritoneal carcinomatosis - recurrent SBO, if re occurs, she might need G tube.  - will be allowed to have small sips of fluids and if tolerates can try taking NG off suction or clamping it - TPN started - d/c IVF with D5 and transition to NS with 70mEq of KCl to decrease risk of refeeding syndrome. - pt reports worsening abdominal distention, ordered US abd to evalute for recurrent ascites.  - patient can attempt to drink liquids (either with or without clamping NGT)  Mild hyponatremia:  -Secondary to volume depletion -Improved with hydration Monitor prn.   Mild thrombocytosis: -likely reactive    DVT prophylaxis: Lovenox Code Status: Full Family Communication:  Updated  patients daughter who is bedside. Disposition Plan: pending further management.     Consultants:   GYN oncology Dr. Denman George 05/23/2016  Gen. surgery Dr.Toth III 05/23/2016  Oncology: Dr. Marko Plume 05/23/2016  Procedures:   CT abdomen and pelvis 05/20/2016  Abdominal x-rays 05/23/2016, 05/24/2016, 05/25/2016, 05/27/2016, 05/28/2016, 05/30/2016, 05/28/2016, 05/29/2016  Right IJ single-lumen power port catheter insertion per Dr. Annamaria Boots 05/24/2016  2-D echo 05/24/2016  Ultrasound-guided paracentesis--2.9 L of clear yellow fluid removed--Dr. Earleen Newport 05/22/2016 cytology positive for adenocarcinoma  Antimicrobials:   IV Ancef 05/24/16 x 1 dose    Subjective: Patient reports pain when swallowing, because of reinserting the NG tube.  Diflucan added by oncology.   Objective: Vitals:   06/03/16 1445 06/03/16 2030 06/04/16 0445 06/04/16 1226  BP: 128/74 (!) 144/89 (!) 167/86 126/71  Pulse: 91 90 97 98  Resp: 16 16 16 16   Temp: 98.7 F (37.1 C) 98.3 F (36.8 C) 98.8 F (37.1 C) 98.3 F (36.8 C)  TempSrc: Oral Oral Oral Oral  SpO2: 95% 98% 98% 96%  Weight:      Height:        Intake/Output Summary (Last 24 hours) at 06/04/16 1353 Last data filed at 06/04/16 1225  Gross per 24 hour  Intake             1784 ml  Output              200 ml  Net             1584 ml   Filed Weights   05/20/16 1332 05/20/16 1818  Weight: 78 kg (171 lb 15.3 oz) 80.2 kg (176 lb 12.9 oz)    Examination:  General exam: Appears uncomfortable.  Respiratory system: Clear to auscultation. Respiratory effort normal. Cardiovascular system: S1 & S2 heard, RRR. No JVD, murmurs, rubs, gallops or clicks. No pedal edema. Gastrointestinal system: Abdomen is distended, soft and minimally diffusely tender. Minimal Bowel sounds heard. Central nervous system: Alert and oriented. No focal neurological deficits. Extremities: Symmetric 4 x 5 power. Skin: No rashes, lesions or ulcers     Data Reviewed: I  have personally reviewed following labs and imaging studies  CBC:  Recent Labs Lab 05/29/16 1009 05/30/16 1200 05/31/16 1741 06/01/16 0416  WBC 9.6 10.1 10.0 10.6*  NEUTROABS  --  7.3  --  7.5  HGB 12.7 12.6 12.0 11.8*  HCT 38.1 37.5 36.0 35.8*  MCV 87.0 87.8 87.8 88.2  PLT 458* 445* 437* 123456*   Basic Metabolic Panel:  Recent Labs Lab 05/31/16 1741 06/01/16 0416 06/02/16 0435 06/03/16 0532 06/04/16 0727  NA 139 137 132* 132* 131*  K 3.5 3.8 3.7 4.4 4.5  CL 108 105 102 100* 100*  CO2 24 26 25 26 23   GLUCOSE 147* 136* 170* 163* 123*  BUN 6 8 14 16 19   CREATININE 0.48 0.56 0.52 0.55 0.34*  CALCIUM 8.1* 7.9* 7.9* 8.1* 8.0*  MG 1.8 1.9 1.8 1.7 1.8  PHOS 3.3 3.5 3.2 3.2 3.4   GFR: Estimated Creatinine Clearance: 60.9 mL/min (by C-G formula based on SCr of 0.34 mg/dL (L)). Liver Function Tests:  Recent Labs Lab 05/31/16 1741 06/01/16 0416 06/02/16 0435 06/03/16 0532 06/04/16 0727  AST 11* 12* 13* 12* 14*  ALT 7* 10* 8* 7* 8*  ALKPHOS 62 61 64 73 78  BILITOT 0.6 0.6 0.4 0.4 0.5  PROT 5.5* 5.2* 5.2* 5.5* 5.2*  ALBUMIN 2.4* 2.3* 2.4* 2.3* 2.2*   No results for input(s):  LIPASE, AMYLASE in the last 168 hours. No results for input(s): AMMONIA in the last 168 hours. Coagulation Profile: No results for input(s): INR, PROTIME in the last 168 hours. Cardiac Enzymes: No results for input(s): CKTOTAL, CKMB, CKMBINDEX, TROPONINI in the last 168 hours. BNP (last 3 results) No results for input(s): PROBNP in the last 8760 hours. HbA1C: No results for input(s): HGBA1C in the last 72 hours. CBG:  Recent Labs Lab 06/03/16 2010 06/04/16 0005 06/04/16 0423 06/04/16 0730 06/04/16 1217  GLUCAP 123* 146* 122* 117* 131*   Lipid Profile: No results for input(s): CHOL, HDL, LDLCALC, TRIG, CHOLHDL, LDLDIRECT in the last 72 hours. Thyroid Function Tests: No results for input(s): TSH, T4TOTAL, FREET4, T3FREE, THYROIDAB in the last 72 hours. Anemia Panel: No results for  input(s): VITAMINB12, FOLATE, FERRITIN, TIBC, IRON, RETICCTPCT in the last 72 hours. Sepsis Labs: No results for input(s): PROCALCITON, LATICACIDVEN in the last 168 hours.  Recent Results (from the past 240 hour(s))  Urine culture     Status: Abnormal   Collection Time: 05/25/16 10:48 PM  Result Value Ref Range Status   Specimen Description URINE, CLEAN CATCH  Final   Special Requests NONE  Final   Culture (A)  Final    <10,000 COLONIES/mL INSIGNIFICANT GROWTH Performed at [facility]    Report Status 05/27/2016 FINAL  Final         Radiology Studies: No results found.      Scheduled Meds: . enoxaparin (LOVENOX) injection  40 mg Subcutaneous Q24H  . feeding supplement  1 Container Oral QID  . fluconazole (DIFLUCAN) IV  100 mg Intravenous Q24H  . insulin aspart  0-9 Units Subcutaneous Q4H  . pantoprazole (PROTONIX) IV  40 mg Intravenous Q24H   Continuous Infusions: . sodium chloride    . Marland KitchenTPN (CLINIMIX-E) Adult 70 mL/hr at 06/04/16 0600   And  . fat emulsion 240 mL (06/03/16 1852)  . Marland KitchenTPN (CLINIMIX-E) Adult     And  . fat emulsion       LOS: 14 days    Time spent: 44 minutes    Kevyn Boquet, MD [facility] Pager [phone]  If 7PM-7AM, please contact night-coverage [email or web address] Password [email or web address] 06/04/2016, 1:53 PM

## 2016-06-04 NOTE — Progress Notes (Signed)
Medical Oncoloyg June 04, 2016, 12:17 PM  Hospital day 16 Chemotherapy: day 6 cycle 1 doxil Day 1 diflucan TNA begun 05-31-16 pm   Patient seen with daughter at bedside. Discussed with unit RN.  Subjective: Felt better after toradol on 12-30, able to be up in chair and stand by bed. Had toradol again 0800, then up in recliner for a few hours, but uncomfortable in abdomen so has returned to bed. NG to suction, 800 cc out yesterday including some po clear liquids. Taking sips of clears only today.  Passing some flatus, no BM today. Feels "mucous" low in throat, and uncomfortable in throat with NG. Has not tried viscous lidocaine or cepacol lozenges, ordered and discussed with RN. Slight pedal edema new 12-30, none in legs, no soreness. Is voiding. Sleeps some, with nursing interruptions. PAC ok. No skin irritation since doxil, using lotion.  Oncologic History Patient had hysterectomy without oophorectomy in her 65s for benign indication, and been in her usual good health until July 2016, when she developed abdominal discomfort and bloating. In late 03-2015 she was found to have large pelvic mass and perihepatic ascites. CA 125 then was 359, with CEA of 3, CA 19-9 5.8 and alpha fetoprotein 2.3. She had exploratory laparotomy 04-19-15, which was not done by gyn oncologist, with bilateral ovaried an tumors, 10 cm with capsule ruptured on right , smaller on left, peritoneal carcinomatosis. Washings were done, however accompanying information does not describe extent of debulking. She had some bowel obstruction post operatively, with NG used ~ POD 3. Pathology available 05-05-15 showed cystic and solid left ovarian mass, 13 cm right ovarian mass, moderately differentiated tumor "tumor de celulas de la granulosa". 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 CT AP 122016 had ascites, carcinomatosis and multiple peritoneal implants.  CA 125 on 05-24-15 baseline for chemo was 141. She received 3 cycles of taxol + carboplatin from 05-26-15 thru 07-07-15 (not clear if avastin given those cycles), after which carboplatin was not available in [place]. She received taxol 175 mg/m2, CDDP and avastin for 3 additional cycles thru 10-07-15. Second look laparotomy was discussed, patient declined, and instead had 4 cycles avastin + taxol from 12-01-15 thru 02-02-16 (family is confirming, but we believe no CDDP after 10-07-15 and no carboplatin after 07-07-15). CT CAP 02-24-16 reportedly had no evidence of malignancy, with some bullous emphysema, tortuous sigmoid colon with diverticulosis. . Recommendation was for maintenance avastin x 1 year, however patient then came to [facility] in 02-2016. She has not established with any physicians since coming to [place]. CT AP 05-20-16 lias arge volume ascites, extensive peritoneal disease with omental thickening, trace bilateral pleural effusions, left colonic diverticulosis, bilateral inguinal hernias containing fat, post hysterectomy, no adnexal masses, no adenopathy. US paracentesis 05-22-16 for 2.9 liters of yellow fluid. Cytology adenocarcinoma withserous features, immunohistochemical stains confirm gyn. First doxil given 05-30-16, chosen due to side effect profile but note may still be platinum sensitive.    Objective: Vital signs in last 24 hours: Blood pressure (!) 167/86, pulse 97, temperature 98.8 F (37.1 C), temperature source Oral, resp. rate 16, height 5\' 3"  (1.6 m), weight 176 lb 12.9 oz (80.2 kg), SpO2 98 %. Awake, alert, belching some, looks at least mildly uncomfortable. Oral mucosa moist, thrush on tongue and right buccal mucosa new. NG with clear tan fluid. Lungs clear anteriorly. Heart RRR. PAC site ok, infusing TNA. NSL RUE. Abdomen distended, quiet, not acutely tender, cannot tell  fluid wave, not tight. Trace pedal edema bilaterally new in last 48 hrs, none in lower legs or thighs, no tenderness or cords.  Moves all extremeties.   Intake/Output from previous day: 12/30 0701 - 12/31 0700 In: 1644 [I.V.:1544; IV Piggyback:100] Out: 325 [Urine:125; Emesis/NG output:200] Intake/Output this shift: No intake/output data recorded.    Lab Results: No results for input(s): WBC, HGB, HCT, PLT in the last 72 hours. BMET  Recent Labs  06/03/16 0532 06/04/16 0727  NA 132* 131*  K 4.4 4.5  CL 100* 100*  CO2 26 23  GLUCOSE 163* 123*  BUN 16 19  CREATININE 0.55 0.34*  CALCIUM 8.1* 8.0*   For CBC diff on 06-05-16  Studies/Results: No results found. Last abd xray 05-31-16 Last abd [place] 05-28-16 negative then for significant recurrent ascites Last CT AP 05-20-16  Assessment/Plan: 1.advanced ovarian carcinoma: Bilateral ovarian by history, appears high grade serous, IIIC. Progressive within 4 months of taxol + avastin (last 02-02-16); appears thatlast CDDP was 10-2015 andlast carboplatin was 07-07-15, so may not be technically platinum resistant.. Rapidly symptomatic with malignant ascites and peritoneal carcinomatosis in 2 weeks PTA. Post paracentesis 2.9 liters on 05-22-16, no significant ascites by [place] 12-24. . First doxil given 05-30-16 tho any improvement in carcinomatosis likely not rapid.  She may get significant improvement/ some period of control of diseasefrom additional systemic treatment; she wasin very good condition otherwise at time of recurrence, tho has declined in hospital. Short term TNA begun 05-31-16 Palliative Care support in hospital may be useful.   2. Small bowel obstruction: partial, reoccurred within 24 hours when NG removed 12-24.  Bowel rest with TNA for now. Concern that there may be multiple areas of obstruction with extensive carcinomatosis, continuing conservative management to see if improvement with chemo given this week. G tube may be better tolerated for palliation than NG if obstruction does not improve. May be useful to repeat abd xray in next 24 hrs.  3.  marked diverticulosis by CT, without evidence diverticulitis then. ? If this is contributing to lower abdominal pain.  4.small bilateral pleural effusions by CXR likely related to recurrent gyn cancer. Not symptomatic now. 5.inguinal hernias containing only fat 6.aortic atherosclerosis by CT 7.social situation: recently moved from [place] to be with daughters in [place], family very supportive. Patient speaks Spanish. No insurance tho may be medicaid eligible.  8. Nutrition: poor po intake beginning 2-3 days prior to admission, short term TNA to see if improvement from chemo or if surgery of some type appropriate.  9.PAC placed by IR 10. Echocardiogram with good EF 05-25-16, for doxil 11. flu vaccine done 05-28-16 12.oral candida: begin IV diflucan, ordered.  Please page [phone] or call [phone] if I can be of help between rounds.  Kamika [employer]

## 2016-06-05 ENCOUNTER — Inpatient Hospital Stay (HOSPITAL_COMMUNITY): Payer: Medicaid Other

## 2016-06-05 LAB — COMPREHENSIVE METABOLIC PANEL
ALK PHOS: 85 U/L (ref 38–126)
ALT: 9 U/L — AB (ref 14–54)
AST: 14 U/L — ABNORMAL LOW (ref 15–41)
Albumin: 2.2 g/dL — ABNORMAL LOW (ref 3.5–5.0)
Anion gap: 7 (ref 5–15)
BILIRUBIN TOTAL: 0.3 mg/dL (ref 0.3–1.2)
BUN: 22 mg/dL — ABNORMAL HIGH (ref 6–20)
CALCIUM: 8 mg/dL — AB (ref 8.9–10.3)
CO2: 25 mmol/L (ref 22–32)
CREATININE: 0.63 mg/dL (ref 0.44–1.00)
Chloride: 99 mmol/L — ABNORMAL LOW (ref 101–111)
GFR calc non Af Amer: >60 mL/min (ref >60)
Glucose, Bld: 144 mg/dL — ABNORMAL HIGH (ref 65–99)
Potassium: 4.3 mmol/L (ref 3.5–5.1)
Sodium: 131 mmol/L — ABNORMAL LOW (ref 135–145)
TOTAL PROTEIN: 5 g/dL — AB (ref 6.5–8.1)

## 2016-06-05 LAB — DIFFERENTIAL
BASOS ABS: 0 10*3/uL (ref 0.0–0.1)
BASOS PCT: 0 %
EOS ABS: 0.2 10*3/uL (ref 0.0–0.7)
EOS PCT: 2 %
Lymphocytes Relative: 12 %
Lymphs Abs: 1 10*3/uL (ref 0.7–4.0)
Monocytes Absolute: 0.7 10*3/uL (ref 0.1–1.0)
Monocytes Relative: 8 %
NEUTROS PCT: 78 %
Neutro Abs: 6.3 10*3/uL (ref 1.7–7.7)

## 2016-06-05 LAB — MAGNESIUM: MAGNESIUM: 1.8 mg/dL (ref 1.7–2.4)

## 2016-06-05 LAB — GLUCOSE, CAPILLARY
GLUCOSE-CAPILLARY: 143 mg/dL — AB (ref 65–99)
GLUCOSE-CAPILLARY: 127 mg/dL — AB (ref 65–99)
GLUCOSE-CAPILLARY: 122 mg/dL — AB (ref 65–99)
Glucose-Capillary: 127 mg/dL — ABNORMAL HIGH (ref 65–99)
Glucose-Capillary: 132 mg/dL — ABNORMAL HIGH (ref 65–99)
Glucose-Capillary: 140 mg/dL — ABNORMAL HIGH (ref 65–99)

## 2016-06-05 LAB — TRIGLYCERIDES: Triglycerides: 76 mg/dL (ref <150)

## 2016-06-05 LAB — CBC
HCT: 30.1 % — ABNORMAL LOW (ref 36.0–46.0)
Hemoglobin: 10 g/dL — ABNORMAL LOW (ref 12.0–15.0)
MCH: 28.7 pg (ref 26.0–34.0)
MCHC: 33.2 g/dL (ref 30.0–36.0)
MCV: 86.5 fL (ref 78.0–100.0)
Platelets: 474 10*3/uL — ABNORMAL HIGH (ref 150–400)
RBC: 3.48 MIL/uL — ABNORMAL LOW (ref 3.87–5.11)
RDW: 13 % (ref 11.5–15.5)
WBC: 8.2 10*3/uL (ref 4.0–10.5)

## 2016-06-05 LAB — PHOSPHORUS: PHOSPHORUS: 3.7 mg/dL (ref 2.5–4.6)

## 2016-06-05 LAB — PREALBUMIN

## 2016-06-05 MED ORDER — ALBUTEROL SULFATE (2.5 MG/3ML) 0.083% IN NEBU: 2.5000 mg | INHALATION_SOLUTION | RESPIRATORY_TRACT | Status: DC

## 2016-06-05 MED ORDER — HYDROMORPHONE HCL 1 MG/ML IJ SOLN
0.5000 mg | INTRAMUSCULAR | Status: DC | PRN
  Administered 2016-06-05 (×2): 0.5 mg via INTRAVENOUS
  Filled 2016-06-05 (×2): qty 1

## 2016-06-05 MED ORDER — HYDROMORPHONE HCL 1 MG/ML IJ SOLN
0.5000 mg | INTRAMUSCULAR | Status: DC | PRN
  Administered 2016-06-05 – 2016-06-07 (×9): 1 mg via INTRAVENOUS
  Filled 2016-06-05 (×10): qty 1

## 2016-06-05 MED ORDER — ALBUTEROL SULFATE (2.5 MG/3ML) 0.083% IN NEBU
2.5000 mg | INHALATION_SOLUTION | Freq: Four times a day (QID) | RESPIRATORY_TRACT | Status: DC | PRN
  Administered 2016-06-05: 2.5 mg via RESPIRATORY_TRACT
  Filled 2016-06-05: qty 3

## 2016-06-05 MED ORDER — FAT EMULSION 20 % IV EMUL
240.0000 mL | INTRAVENOUS | Status: AC
  Administered 2016-06-05: 240 mL via INTRAVENOUS
  Filled 2016-06-05: qty 250

## 2016-06-05 MED ORDER — TRACE MINERALS CR-CU-MN-SE-ZN 10-1000-500-60 MCG/ML IV SOLN
INTRAVENOUS | Status: AC
  Administered 2016-06-05: 17:00:00 via INTRAVENOUS
  Filled 2016-06-05: qty 1680

## 2016-06-05 NOTE — Progress Notes (Signed)
Medical Oncology June 05, 2016, 1:50 PM  Hospital day 17 Chemotherapy: day 7 cycle 1 doxil Diflucan day 2 TNA begun 12-27 PM  EMR reviewed, including abd [place] from 06-04-16 Patient seen, daughter at bedside. Discussed with unit RN.  Subjective: Very uncomfortable in throat from NG, and in abdomen. Able to stand beside bed and sit in recliner after IV dilaudid 0.5 mg, which does not last 3 hrs, but otherwise so uncomfortable from abdominal fullness that she has to lie supine. More weak and SOB with minimal exertion, including sitting up on SOB now. Some cough, some wheezing new today. Feels "mucous in throat". Still passing flatus. Swelling in feet ~ same as yesterday. Daughter is moving her legs for exercise when up in chair. Viscous lidocaine made her nauseated. Cloroseptic spray not very helpful. Trying lozenges for the throat pain. No problems with [facility]   Oncologic History Patient had hysterectomy without oophorectomy in her 37s for benign indication, and been in her usual good health until July 2016, when she developed abdominal discomfort and bloating. In late 03-2015 she was found to have large pelvic mass and perihepatic ascites. CA 125 then was 359, with CEA of 3, CA 19-9 5.8 and alpha fetoprotein 2.3. She had exploratory laparotomy 04-19-15, which was not done by gyn oncologist, with bilateral ovaried an tumors, 10 cm with capsule ruptured on right , smaller on left, peritoneal carcinomatosis. Washings were done, however accompanying information does not describe extent of debulking. She had some bowel obstruction post operatively, with NG used ~ POD 3. Pathology available 05-05-15 showed cystic and solid left ovarian mass, 13 cm right ovarian mass, moderately differentiated tumor "tumor de celulas de la granulosa". 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 CT AP 122016 had ascites, carcinomatosis and multiple  peritoneal implants. CA 125 on 05-24-15 baseline for chemo was 141. She received 3 cycles of taxol + carboplatin from 05-26-15 thru 07-07-15 (not clear if avastin given those cycles), after which carboplatin was not available in [place]. She received taxol 175 mg/m2, CDDP and avastin for 3 additional cycles thru 10-07-15. Second look laparotomy was discussed, patient declined, and instead had 4 cycles avastin + taxol from 12-01-15 thru 02-02-16 (family is confirming, but we believe no CDDP after 10-07-15 and no carboplatin after 07-07-15). CT CAP 02-24-16 reportedly had no evidence of malignancy, with some bullous emphysema, tortuous sigmoid colon with diverticulosis. . Recommendation was for maintenance avastin x 1 year, however patient then came to [facility] in 02-2016. She has not established with any physicians since coming to [place]. CT AP 05-20-16 lias arge volume ascites, extensive peritoneal disease with omental thickening, trace bilateral pleural effusions, left colonic diverticulosis, bilateral inguinal hernias containing fat, post hysterectomy, no adnexal masses, no adenopathy. US paracentesis 05-22-16 for 2.9 liters of yellow fluid. Cytology adenocarcinoma withserous features, immunohistochemical stains confirm gyn. First doxil given 05-30-16, chosen due to side effect profile but note may still be platinum sensitive.      Objective: Vital signs in last 24 hours: Blood pressure (!) 142/89, pulse 95, temperature 98.4 F (36.9 C), temperature source Oral, resp. rate 16, height 5\' 3"  (1.6 m), weight 193 lb 9 oz (87.8 kg), SpO2 95 %. Awake, alert, looks more uncomfortable and clearly more weak than 7-10 days ago. Dyspneic sitting up to side of bed for auscultation of lungs, and when lying back down. Some audible wheeze at times. Oral mucosa moist, thrush on tongue better, still small  amount bilateral buccal mucosa, no ulcerations in mouth concerning for doxil mucositis there. Lungs with decreased BS and some dullness  right base, fine crackles scattered, slight wheezing anteriorly. PAC site fine, infusing TNA without difficulty. NSL RUE. Abdomen distended, not quite tight, a few BS. No acute tenderness. LE 1+ pedal edema bilaterally without cords or tenderness. Moves all extremities equally, with effort.   Intake/Output from previous day: 12/31 0701 - 01/01 0700 In: 1675 [P.O.:490; I.V.:1135; IV Piggyback:50] Out: -  Intake/Output this shift: No intake/output data recorded.    Lab Results:  Recent Labs  06/05/16 0443  WBC 8.2  HGB 10.0*  HCT 30.1*  PLT 474*   Blood counts noted, day 7 cycle 1 doxil  BMET  Recent Labs  06/04/16 0727 06/05/16 0443  NA 131* 131*  K 4.5 4.3  CL 100* 99*  CO2 23 25  GLUCOSE 123* 144*  BUN 19 22*  CREATININE 0.34* 0.63  CALCIUM 8.0* 8.0*    Studies/Results: US Abdomen Port  Result Date: 06/04/2016 CLINICAL DATA:  Progressive abdominal distension over the last 3 weeks. History of metastatic ovarian cancer. EXAM: ABDOMEN ULTRASOUND COMPLETE COMPARISON:  Ultrasound 05/28/2016.  CT 05/20/2016. FINDINGS: Gallbladder: No gallstones or wall thickening visualized. No sonographic Murphy sign noted by sonographer. Common bile duct: Diameter: 3 mm, normal Liver: No focal lesion identified. Within normal limits in parenchymal echogenicity. IVC: No abnormality visualized. Pancreas: Not visualized because of bowel gas. Spleen: Size and appearance within normal limits. Right Kidney: Length: 9.6 cm. Echogenicity within normal limits. No mass or hydronephrosis visualized. Left Kidney: Length: 9 x 1 cm. Echogenicity within normal limits. No mass or hydronephrosis visualized. Abdominal aorta: No aneurysm visualized. Other findings: Moderate to large amount of ascites freely distributed in all 4 quadrants. Increased since 12/24. IMPRESSION: Increased amount of ascites, freely distributed in all 4 quadrants. No other acute finding by ultrasound. Electronically Signed   By: Nelson Chimes M.D.   On: 06/04/2016 16:11   Port CXR ordered with new wheezing, increased SOB, some cough Port KUB ordered to follow up bowel gas pattern still on NG suction.  Assessment/Plan: 1.advanced ovarian carcinoma: Bilateral ovarian by history, appearshigh grade serous, IIIC. Progressive within 4 months of taxol + avastin (last 02-02-16); appears thatlast CDDP was 10-2015 andlast carboplatin was 07-07-15, so may not be technically platinum resistant.. Rapidly symptomatic with malignant ascites and peritoneal carcinomatosis in 2 weeks PTA.  Paracentesis 2.9 liters on 05-22-16, no significant ascites by [place] 12-24, however significant ascites again by [place] 12-31. I have requested US paracentesis for 06-06-16.  First doxil given 05-30-16 thoany improvement in carcinomatosis likely not rapid. Counts ok today, hopefully will not drop with doxil. She may get significant improvement/ some period of control of diseasefrom additional systemic treatment; she wasin very good condition otherwise at time of recurrence, tho has declined in hospital. Short term TNA begun 05-31-16 Palliative Care support in hospital may be useful.  2. Small bowel obstruction: partial, reoccurred within 24 hours when NG removed 12-24.  Still passing some flatus. Bowel rest with NG and TNA for now. Concern that there may be multiple areas of obstruction with extensive carcinomatosis, continuingconservative management to see if improvement with chemo given just a week ago. She is having significant discomfort in throat from NG; G tube may be better tolerated for palliation than NG if obstruction does not improve. Abdominal Xray ordered to follow up bowel gas pattern.  3. Increased SOB today, with new cough and some wheezing.  CXR, albuterol neb and incentive spirometer ordered. Note small bilateral pleural effusions by CXR previously. Not febrile.  4.marked diverticulosis by CT, without evidence diverticulitis then. ? If this is  contributing to lower abdominal pain.  5.inguinal hernias containing only fat 6.aortic atherosclerosis by CT 7.social situation: recently moved from [place] to be with daughters in [place], family very supportive. Patient speaks Spanish. No insurance tho may be medicaid eligible.  8. Nutrition: poor po intake beginning 2-3 days prior to admission, short term TNA begun 12-27 to see if improvement from chemo or if surgery of some type appropriate. Daughter aware that TNA would not be appropriate for long term.  9.PAC placed by IR 10. Echocardiogram with good EF 05-25-16, for doxil 11. flu vaccine done 05-28-16 12.oral candida: IV diflucan started 06-04-16. Slightly better today. May be esophageal involvement also.   Please page [phone] or call [phone] if I can be of help between rounds.  [employer]

## 2016-06-05 NOTE — Progress Notes (Addendum)
PROGRESS NOTE    Audrey Garza  [id number] DOB: August 19, 1940 DOA: 05/20/2016  PCP: Pt has no PCP here yet as hse recently moved here  Brief Narrative:  76 year old female with a history of metastatic ovarian cancer diagnosed in January 2017 (s/p primary debulking surgery followed by 8 adjuvant cycles of carboplatin and paclitaxel and 4 additional cycles of carboplatin, paclitaxel, avastin), last chemo Sept 2017, now presented with progressive abdominal bloating and pain for 3 weeks duration. She was feeling well until the first week of December, 2017 when she was sick with a virus from which she never recovered. She states that she began feeling distended in the abdomen and uncomfortable and was taken to the ED on 05/20/16.  CT chest/abd/pelvis9-21-17 reportedly with no evidence of malignancy, with some bullous emphysema, tortuous sigmoid colon with diverticulosis. . Recommendation was for maintenance avastin x 1 year, however patient then came to [facility] in 02-2016. She has not established care with any physician since coming to [place]. CT AP 05-20-16 showed large volume ascites, extensive peritoneal disease with omental thickening, trace bilateral pleural effusions, left colonic diverticulosis, bilateral inguinal hernias containing fat, post hysterectomy, no adnexal masses, no adenopathy. US paracentesis 05-22-16 revealed 2.9 liters of yellow fluid. 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 Unfortunately, after paracentesis she began having increasing abdominal pain. Abdominal x-ray revealed distended small bowel loops concerning for small bowel obstruction. Gen. surgery was consulted. NG was inserted to suction.  Currently she is NPO, on NG tube suction, hoping for resolution of SBO.   Assessment & Plan:  Metastatic ovarian cancer with extensive peritoneal disease with omental thickening with large ascites - appreciate Drs. Livesay and Rossi's recommendations.  -  Progressive within 4 months of last systemic treatment, she has been taxol + avastin 02-02-16 - rapidly symptomatic with malignant ascites and peritoneal carcinomatosis.  - ca 125 elevated -S/p paracentesis on 12/18 with 2.9 litter fluids removed, repeat US 12/31 with interval progression of ascites  - Cytology revealed serous carcinoma favoring ovarian or peritoneal origin - palliative chemo after acute medical issues stablized per oncology  - Per Dr. Denman George, pt is not a candidate for interval debulking surgery due to the platinum resistant nature of her disease - if pt fails advancement of diet, she should be considered for palliative G tube (discussed with surgery) - unclear patient's prognosis - symptomatic management with pain control   Small Bowel Obstruction - 05/23/2016 abdominal x-ray multiple dilated loops of bowel concerning for small bowel obstruction - Likely due to the extensive peritoneal carcinomatosis - recurrent SBO, if re occurs, she might need G tube.  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 Disposition Plan: pending further management and PCT discussions   Consultants:   GYN oncology Dr. Denman George 05/23/2016  Gen. surgery Dr.Toth III 05/23/2016  Oncology: Dr. Marko Plume 05/23/2016  Procedures:   CT abdomen and pelvis 05/20/2016  Abdominal x-rays 05/23/2016, 05/24/2016, 05/25/2016, 05/27/2016, 05/28/2016, 05/30/2016, 05/28/2016, 05/29/2016  Right IJ single-lumen power port catheter insertion per Dr. Annamaria Boots 05/24/2016  2-D echo  05/24/2016  Ultrasound-guided paracentesis--2.9 L of clear yellow fluid  removed--Dr. Earleen Newport 05/22/2016 cytology positive for adenocarcinoma  Antimicrobials:   IV Ancef 05/24/16 x 1 dose   Subjective: Persistent abd pain, 10/10 in severity. Asking for more pain medication.   Objective: Vitals:   06/04/16 1226 06/04/16 2001 06/05/16 0409 06/05/16 0621  BP: 126/71 (!) 107/52 (!) 142/89   Pulse: 98 83 95   Resp: 16 14 16    Temp: 98.3 F (36.8 C) 98.6 F (37 C) 98.4 F (36.9 C)   TempSrc: Oral Oral Oral   SpO2: 96% 94% 95%   Weight:    87.8 kg (193 lb 9 oz)  Height:        Intake/Output Summary (Last 24 hours) at 06/05/16 1208 Last data filed at 06/04/16 1835  Gross per 24 hour  Intake             1675 ml  Output                0 ml  Net             1675 ml   Filed Weights   05/20/16 1332 05/20/16 1818 06/05/16 0621  Weight: 78 kg (171 lb 15.3 oz) 80.2 kg (176 lb 12.9 oz) 87.8 kg (193 lb 9 oz)    Examination:  General exam: Appears uncomfortable.  Respiratory system: Clear to auscultation. Respiratory effort normal. Cardiovascular system: S1 & S2 heard, RRR. No JVD, murmurs, rubs, gallops or clicks. No pedal edema. Gastrointestinal system: Abdomen is distended, soft and moderately diffusely tender. Bowel sounds heard. Central nervous system: Alert and oriented. No focal neurological deficits. Extremities: Symmetric 4 x 5 power.  Data Reviewed: I have personally reviewed following labs and imaging studies  CBC:  Recent Labs Lab 05/30/16 1200 05/31/16 1741 06/01/16 0416 06/05/16 0443  WBC 10.1 10.0 10.6* 8.2  NEUTROABS 7.3  --  7.5 6.3  HGB 12.6 12.0 11.8* 10.0*  HCT 37.5 36.0 35.8* 30.1*  MCV 87.8 87.8 88.2 86.5  PLT 445* 437* 411* [id number]*   Basic Metabolic Panel:  Recent Labs Lab 06/01/16 0416 06/02/16 0435 06/03/16 0532 06/04/16 0727 06/05/16 0443  NA 137 132* 132* 131* 131*  K 3.8 3.7 4.4 4.5 4.3  CL 105 102 100* 100* 99*  CO2 26 25 26 23 25    GLUCOSE 136* 170* 163* 123* 144*  BUN 8 14 16 19  22*  CREATININE 0.56 0.52 0.55 0.34* 0.63  CALCIUM 7.9* 7.9* 8.1* 8.0* 8.0*  MG 1.9 1.8 1.7 1.8 1.8  PHOS 3.5 3.2 3.2 3.4 3.7   Liver Function Tests:  Recent Labs Lab 06/01/16 0416 06/02/16 0435 06/03/16 0532 06/04/16 0727 06/05/16 0443  AST 12* 13* 12* 14* 14*  ALT 10* 8* 7* 8* 9*  ALKPHOS 61 64 73 78 85  BILITOT 0.6 0.4 0.4 0.5 0.3  PROT 5.2* 5.2* 5.5* 5.2* 5.0*  ALBUMIN 2.3* 2.4* 2.3* 2.2* 2.2*   CBG:  Recent Labs Lab 06/04/16 1632 06/04/16 1957 06/05/16 0010 06/05/16 0403 06/05/16 0745  GLUCAP 137* 135* 132* 143* 127*   Lipid Profile:  Recent Labs  06/05/16 0443  TRIG 76    Radiology Studies: US Abdomen Port  Result Date: 06/04/2016 CLINICAL DATA:  Progressive abdominal distension over the last 3 weeks. History of metastatic ovarian cancer. EXAM: ABDOMEN ULTRASOUND COMPLETE COMPARISON:  Ultrasound 05/28/2016.  CT 05/20/2016. FINDINGS: Gallbladder: No gallstones or wall thickening visualized. No sonographic Murphy sign noted by sonographer. Common bile duct: Diameter: 3 mm, normal Liver: No focal lesion identified. Within normal limits in  parenchymal echogenicity. IVC: No abnormality visualized. Pancreas: Not visualized because of bowel gas. Spleen: Size and appearance within normal limits. Right Kidney: Length: 9.6 cm. Echogenicity within normal limits. No mass or hydronephrosis visualized. Left Kidney: Length: 9 x 1 cm. Echogenicity within normal limits. No mass or hydronephrosis visualized. Abdominal aorta: No aneurysm visualized. Other findings: Moderate to large amount of ascites freely distributed in all 4 quadrants. Increased since 12/24. IMPRESSION: Increased amount of ascites, freely distributed in all 4 quadrants. No other acute finding by ultrasound. Electronically Signed   By: Nelson Chimes M.D.   On: 06/04/2016 16:11    Scheduled Meds: . enoxaparin (LOVENOX) injection  40 mg Subcutaneous Q24H  .  feeding supplement  1 Container Oral QID  . fluconazole (DIFLUCAN) IV  100 mg Intravenous Q24H  . insulin aspart  0-9 Units Subcutaneous Q4H  . pantoprazole (PROTONIX) IV  40 mg Intravenous Q24H   Continuous Infusions: . sodium chloride 30 mL/hr at 06/04/16 1530  . Marland KitchenTPN (CLINIMIX-E) Adult 70 mL/hr at 06/04/16 1752   And  . fat emulsion 240 mL (06/04/16 1752)  . Marland KitchenTPN (CLINIMIX-E) Adult     And  . fat emulsion       LOS: 15 days   Time spent: 30 minutes  Faye Ramsay, MD [facility] Pager [phone]  If 7PM-7AM, please contact night-coverage [email or web address] Password [email or web address] 06/05/2016, 12:08 PM

## 2016-06-05 NOTE — Progress Notes (Signed)
PHARMACY - ADULT TOTAL PARENTERAL NUTRITION CONSULT NOTE   Pharmacy Consult for TPN Indication: partial small bowel obstruction  Patient Measurements: Height: 5\' 3"  (160 cm) Weight: 193 lb 9 oz (87.8 kg) IBW/kg (Calculated) : 52.4 TPN AdjBW (KG): 58.8 Body mass index is 34.29 kg/m.  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 Per MD note, has been 12 days since she has been able to take anything PO. She has NGT to LIWS and has failed multiple attempts to d/c NG and advance diet.  TPN to start 12/27.  Significant events:  12/26 D1C1 Doxil 40mg /m2 - tolerated well 12/27 TPN started 12/28 May allow small sips of fluids  Today, 06/05/2016:   Glucose - no h/o DM, CBGs at goal < 150  Electrolytes - Na remains low but stable (131)  Renal - SCr WNL, CrCl 60 ml/min  LFTs - low, stable  TGs - 125 (12/28)  Prealbumin - <5 (12/28)  NGT - 200cc output 12/30 to 12/31  NUTRITIONAL GOALS                                                                                             RD recs (12/26): 1750-1950 kcal, 75-85g protein, 1.7-1.9L/day fluid Clinimix E 5/20 at a goal rate of 70 ml/hr + 20% fat emulsion at 10 ml/hr to provide: 84 g/day protein, 1958 Kcal/day.  (+++There is currently a Psychologist, prison and probation services of Clinimix solution.  Pharmacy will use Clinimix product based on what we have available in stock+++)  PLAN                                                                                                                         At 1800 today:  Continue Clinimix E 5/20 at goal  70 ml/hr.  20% fat emulsion at 10 ml/hr.  TPN to contain standard multivitamins and trace elements.  IVF: continue NS at 30 ml/hr  Continue sensitive SSI and CBG checks q4h.   TPN lab panels on Mondays & Thursdays.   Adrian Saran, PharmD, BCPS Pager [phone] 06/05/2016 7:41 AM

## 2016-06-06 ENCOUNTER — Inpatient Hospital Stay (HOSPITAL_COMMUNITY): Payer: Medicaid Other

## 2016-06-06 LAB — GLUCOSE, CAPILLARY
GLUCOSE-CAPILLARY: 113 mg/dL — AB (ref 65–99)
GLUCOSE-CAPILLARY: 127 mg/dL — AB (ref 65–99)
GLUCOSE-CAPILLARY: 118 mg/dL — AB (ref 65–99)
Glucose-Capillary: 119 mg/dL — ABNORMAL HIGH (ref 65–99)
Glucose-Capillary: 124 mg/dL — ABNORMAL HIGH (ref 65–99)

## 2016-06-06 LAB — BASIC METABOLIC PANEL
ANION GAP: 6 (ref 5–15)
BUN: 22 mg/dL — ABNORMAL HIGH (ref 6–20)
CALCIUM: 8 mg/dL — AB (ref 8.9–10.3)
CO2: 25 mmol/L (ref 22–32)
CREATININE: 0.68 mg/dL (ref 0.44–1.00)
Chloride: 101 mmol/L (ref 101–111)
GFR calc Af Amer: >60 mL/min (ref >60)
GLUCOSE: 136 mg/dL — AB (ref 65–99)
Potassium: 4.6 mmol/L (ref 3.5–5.1)
Sodium: 132 mmol/L — ABNORMAL LOW (ref 135–145)

## 2016-06-06 LAB — CBC
HCT: 29.3 % — ABNORMAL LOW (ref 36.0–46.0)
HEMOGLOBIN: 9.7 g/dL — AB (ref 12.0–15.0)
MCH: 28.7 pg (ref 26.0–34.0)
MCHC: 33.1 g/dL (ref 30.0–36.0)
MCV: 86.7 fL (ref 78.0–100.0)
PLATELETS: 455 10*3/uL — AB (ref 150–400)
RBC: 3.38 MIL/uL — ABNORMAL LOW (ref 3.87–5.11)
RDW: 13.1 % (ref 11.5–15.5)
WBC: 10.1 10*3/uL (ref 4.0–10.5)

## 2016-06-06 LAB — LACTIC ACID, PLASMA
Lactic Acid, Venous: 1.1 mmol/L (ref 0.5–1.9)
Lactic Acid, Venous: 1.2 mmol/L (ref 0.5–1.9)

## 2016-06-06 LAB — PROCALCITONIN: Procalcitonin: 0.16 ng/mL

## 2016-06-06 MED ORDER — SODIUM CHLORIDE 0.9 % IV SOLN
INTRAVENOUS | Status: DC
  Administered 2016-06-09: 10 mL via INTRAVENOUS
  Administered 2016-06-11: 250 mL via INTRAVENOUS

## 2016-06-06 MED ORDER — TRACE MINERALS CR-CU-MN-SE-ZN 10-1000-500-60 MCG/ML IV SOLN
INTRAVENOUS | Status: AC
  Administered 2016-06-06: 17:00:00 via INTRAVENOUS
  Filled 2016-06-06: qty 1680

## 2016-06-06 MED ORDER — INSULIN ASPART 100 UNIT/ML ~~LOC~~ SOLN
0.0000 [IU] | Freq: Four times a day (QID) | SUBCUTANEOUS | Status: DC
  Administered 2016-06-06: 100 [IU] via SUBCUTANEOUS

## 2016-06-06 MED ORDER — FAT EMULSION 20 % IV EMUL
240.0000 mL | INTRAVENOUS | Status: AC
  Administered 2016-06-06: 240 mL via INTRAVENOUS
  Filled 2016-06-06: qty 240

## 2016-06-06 MED ORDER — ACETAMINOPHEN 160 MG/5ML PO SOLN
650.0000 mg | Freq: Four times a day (QID) | ORAL | Status: DC | PRN
  Administered 2016-06-07 – 2016-06-08 (×3): 650 mg via ORAL
  Administered 2016-06-09: 18:00:00 via ORAL
  Administered 2016-06-09: 650 mg via ORAL
  Filled 2016-06-06 (×5): qty 20.3

## 2016-06-06 NOTE — Procedures (Signed)
Ultrasound-guided therapeutic paracentesis performed yielding 4 liters of yellow colored fluid. No immediate complications.  Halford Goetzke E 11:28 AM 06/06/2016

## 2016-06-06 NOTE — Progress Notes (Signed)
Nutrition Follow-up  DOCUMENTATION CODES:   Non-severe (moderate) malnutrition in context of chronic illness  INTERVENTION:   Monitor magnesium, potassium, and phosphorus daily for at least 3 days, MD to replete as needed, as pt is at risk for refeeding syndrome given moderate malnutrition, NPO/CLD with inadequate intake for 9-10 days.  TPN per Pharmacy  D/c Boost Breeze. If diet advanced, will reorder. Will monitor for discussions regarding goals of care.  RD to continue to monitor  NUTRITION DIAGNOSIS:   Increased nutrient needs related to cancer and cancer related treatments as evidenced by estimated needs.  Ongoing.  GOAL:   Patient will meet greater than or equal to 90% of their needs  Meeting with TPN.  MONITOR:   Labs, Weight trends, I & O's, Other (Comment) (TPN)  ASSESSMENT:   With H/o metastatic ovarian cancer s/o surgery and chemo, last chemo in 02/2016 ,she  [place]ly moved from [place]  A few weeks ago. She started to have progressive abdominal bloating and pain in the last three weeks  Pt continues with TPN: Clinimix E 5/20 at goal 70 ml/hr w/ ILE @ 10 ml/hr providing 1958 kcal and 84g protein.  Pt continues to not tolerate POs and NGT removal. NGT to suction now, output: 200 ml.  S/p paracentesis 1/2: yield 4L. Per surgery note, consider palliative PEG. Pt's weight is +22 lb since admission.  Medications reviewed. Labs reviewed: CBGs: 124-127 Low Na  Plan per Pharmacy 1/2: At 1800 today:  Continue Clinimix E 5/20 at goal 70 ml/hr; do not anticipate weaning to oral diet at this point  20% fat emulsion at 10 ml/hr.  Diet Order:  Diet NPO time specified Except for: Ice Chips, Sips with Meds TPN (CLINIMIX-E) Adult TPN (CLINIMIX-E) Adult  Skin:  Reviewed, no issues  Last BM:  12/29  Height:   Ht Readings from Last 1 Encounters:  05/20/16 5\' 3"  (1.6 m)    Weight:   Wt Readings from Last 1 Encounters:  06/05/16 193 lb 9  oz (87.8 kg)    Ideal Body Weight:  52.3 kg  BMI:  Body mass index is 34.29 kg/m.  Estimated Nutritional Needs:   Kcal:  [id number]  Protein:  75-85g  Fluid:  1.7-1.9L/day  EDUCATION NEEDS:   No education needs identified at this time  Clayton Bibles, MS, RD, LDN Pager: [phone] After Hours Pager: [phone]

## 2016-06-06 NOTE — Progress Notes (Signed)
Patient ID: Audrey Garza, female   DOB: January 15, 1941, 76 y.o.   MRN: [id number]  [facility] Surgery Progress Note     Subjective: Audrey Garza is a 76yo female with h/o Metastatic ovarian cancer with extensive peritoneal disease and omental thickening with large ascites. She underwent oophorectomy in [place] last year and finished chemo about 3 months ago. Patient has been admitted to [facility] since 05/20/16 with SBO likely 2/2 to metastatic disease. Chemo started 12/26  Objective: Vital signs in last 24 hours: Temp:  [97.9 F (36.6 C)-98.9 F (37.2 C)] 98.9 F (37.2 C) (01/02 0445) Pulse Rate:  [94-97] 96 (01/02 0445) Resp:  [16-20] 20 (01/02 0445) BP: (120-157)/(72-77) 131/75 (01/02 0445) SpO2:  [92 %-99 %] 99 % (01/02 0445) Last BM Date: 05/03/16  Intake/Output from previous day: 01/01 0701 - 01/02 0700 In: 50 [IV Piggyback:50] Out: 400 [Emesis/NG output:400] Intake/Output this shift: No intake/output data recorded.  PE: Gen:  Alert, NAD, pleasant Card:  RRR, no M/G/R heard Pulm:  CTAB, no W/R/R, effort normal Abd: Soft, moderate distension, tympanic, mild diffuse tenderness, +BS, no HSM Ext:  No erythema, edema, or tenderness   Lab Results:   Recent Labs  06/05/16 0443 06/06/16 0411  WBC 8.2 10.1  HGB 10.0* 9.7*  HCT 30.1* 29.3*  PLT 474* 455*   BMET  Recent Labs  06/05/16 0443 06/06/16 0411  NA 131* 132*  K 4.3 4.6  CL 99* 101  CO2 25 25  GLUCOSE 144* 136*  BUN 22* 22*  CREATININE 0.63 0.68  CALCIUM 8.0* 8.0*   PT/INR No results for input(s): LABPROT, INR in the last 72 hours. CMP     Component Value Date/Time   NA 132 (L) 06/06/2016 0411   K 4.6 06/06/2016 0411   CL 101 06/06/2016 0411   CO2 25 06/06/2016 0411   GLUCOSE 136 (H) 06/06/2016 0411   BUN 22 (H) 06/06/2016 0411   CREATININE 0.68 06/06/2016 0411   CALCIUM 8.0 (L) 06/06/2016 0411   PROT 5.0 (L) 06/05/2016 0443   ALBUMIN 2.2 (L) 06/05/2016 0443   AST 14 (L) 06/05/2016 0443   ALT  9 (L) 06/05/2016 0443   ALKPHOS 85 06/05/2016 0443   BILITOT 0.3 06/05/2016 0443   GFRNONAA >60 06/06/2016 0411   GFRAA >60 06/06/2016 0411   Lipase  No results found for: LIPASE     Studies/Results: US Abdomen Port  Result Date: 06/04/2016 CLINICAL DATA:  Progressive abdominal distension over the last 3 weeks. History of metastatic ovarian cancer. EXAM: ABDOMEN ULTRASOUND COMPLETE COMPARISON:  Ultrasound 05/28/2016.  CT 05/20/2016. FINDINGS: Gallbladder: No gallstones or wall thickening visualized. No sonographic Murphy sign noted by sonographer. Common bile duct: Diameter: 3 mm, normal Liver: No focal lesion identified. Within normal limits in parenchymal echogenicity. IVC: No abnormality visualized. Pancreas: Not visualized because of bowel gas. Spleen: Size and appearance within normal limits. Right Kidney: Length: 9.6 cm. Echogenicity within normal limits. No mass or hydronephrosis visualized. Left Kidney: Length: 9 x 1 cm. Echogenicity within normal limits. No mass or hydronephrosis visualized. Abdominal aorta: No aneurysm visualized. Other findings: Moderate to large amount of ascites freely distributed in all 4 quadrants. Increased since 12/24. IMPRESSION: Increased amount of ascites, freely distributed in all 4 quadrants. No other acute finding by ultrasound. Electronically Signed   By: Nelson Chimes M.D.   On: 06/04/2016 16:11   Dg Chest Port 1v Same Day  Result Date: 06/05/2016 CLINICAL DATA:  Shortness of Breath EXAM: PORTABLE CHEST 1 VIEW  COMPARISON:  None. FINDINGS: Right Port-A-Cath tip is in the lower SVC. NG tube enters the stomach. Heart is normal size. Low lung volumes. Right perihilar and left base atelectasis. No effusions. No acute bony abnormality. IMPRESSION: Areas of atelectasis bilaterally.  Low lung volumes. Electronically Signed   By: Rolm Baptise M.D.   On: 06/05/2016 14:27   Dg Abd Portable 1v  Result Date: 06/05/2016 CLINICAL DATA:  Patient with shortness of  breath. EXAM: PORTABLE ABDOMEN - 1 VIEW COMPARISON:  Abdominal radiograph 05/31/2016. FINDINGS: Enteric tube tip and side-port project over the left upper quadrant. Re- demonstrated gaseous distended loops of small bowel within the central abdomen measuring up to 5.5 cm. Gas is demonstrated within the colon. Supine evaluation limited for the detection of free intraperitoneal air. Lumbar spine degenerative changes. IMPRESSION: Re- demonstrated distended loops of small bowel within the central abdomen concerning for obstruction, potentially partial given colonic gas. Electronically Signed   By: Lovey Newcomer M.D.   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 Denman George: not a candidate for interval debulking surgery due to the platinum resistant nature of her disease - Per oncology started chemotherapy (doxil) 05/30/2016  Plan - Consider palliative PEG to help with pt's comfort.  Can be placed in IR.  No further surgical issues.  Will sign off.  Please call us with any questions of concerns.  No surgical f/u needed.       LOS: 16 days    Rosario Adie , [place] [facility] 06/06/2016, 8:45 AM

## 2016-06-06 NOTE — Progress Notes (Signed)
PROGRESS NOTE    Geneive Kawahara  [id number] DOB: 02/10/1941 DOA: 05/20/2016  PCP: Pt has no PCP here yet as hse recently moved here  Brief Narrative:  76 year old female with a history of metastatic ovarian cancer diagnosed in January 2017 (s/p primary debulking surgery followed by 8 adjuvant cycles of carboplatin and paclitaxel and 4 additional cycles of carboplatin, paclitaxel, avastin), last chemo Sept 2017, now presented with progressive abdominal bloating and pain for 3 weeks duration. She was feeling well until the first week of December, 2017 when she was sick with a virus from which she never recovered. She states that she began feeling distended in the abdomen and uncomfortable and was taken to the ED on 05/20/16.  CT chest/abd/pelvis9-21-17 reportedly with no evidence of malignancy, with some bullous emphysema, tortuous sigmoid colon with diverticulosis. . Recommendation was for maintenance avastin x 1 year, however patient then came [facility] in [facility]-2016. She has not established care with any physician since coming to [place]. CT AP 05-20-16 showed large volume ascites, extensive peritoneal disease with omental thickening, trace bilateral pleural effusions, left colonic diverticulosis, bilateral inguinal hernias containing fat, post hysterectomy, no adnexal masses, no adenopathy. US paracentesis 05-22-16 revealed 2.9 liters of yellow fluid. 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 Unfortunately, after paracentesis she began having increasing abdominal pain. Abdominal x-ray revealed distended small bowel loops concerning for small bowel obstruction. Gen. surgery was consulted. NG was inserted to suction.  Currently she is NPO, on NG tube suction, hoping for resolution of SBO.   Assessment & Plan:  Metastatic ovarian cancer with extensive peritoneal disease with omental thickening with large ascites - appreciate Drs. Livesay and Rossi's recommendations.  -  Progressive within 4 months of last systemic treatment, she has been taxol + avastin 02-02-16 - rapidly symptomatic with malignant ascites and peritoneal carcinomatosis.  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 Denman George, pt is not a candidate for interval debulking surgery due to the platinum resistant nature of her disease - if pt fails advancement of diet, she should be considered for palliative G tube (discussed with surgery) - unclear patient's prognosis - symptomatic management with pain control  - palliative care team consulted for assistance   Fever - noted today 06/06/2016 - with HR in 100's, pt meets SIRS criteria - will ask for UA, urine and blood cultures, lactic acid and procalcitonin level - will also ask for CT chest to make sure no lung involvement, infections vs malignant etiology - ABX will be given pending results above   Small Bowel Obstruction - 05/23/2016 abdominal x-ray multiple dilated loops of bowel concerning for small bowel obstruction - Likely due to the extensive peritoneal carcinomatosis - recurrent SBO, if re occurs, she might need G tube.  - pt reports worsening abdominal pain this am, asking for more pain medications - PCT consulted for further GOC discussion  - IR consulted for consideration of palliative PEG   Mild hyponatremia - Secondary to volume depletion - Improved with hydration - BMP in AM  Mild thrombocytosis - likely reactive  Non severe PCM - in the context of acute on chronic illness - nutritionist assistance appreciated   Anemia of chronic disease, malignancy -  Hg overall stable   Oral Thrush - continue Diflucan   DVT prophylaxis:  Lovenox SQ Code Status: Full Family Communication: Updated patient's daughter at bedside. Disposition Plan: pending further management and PCT discussions   Consultants:   GYN oncology Dr. Denman George 05/23/2016  Gen. surgery Dr.Toth III 05/23/2016  Oncology: Dr. Marko Plume 05/23/2016  PCT - pending   IR for US guided paracentesis and palliative PEG placement   Procedures:   CT abdomen and pelvis 05/20/2016  Abdominal x-rays 05/23/2016, 05/24/2016, 05/25/2016, 05/27/2016, 05/28/2016, 05/30/2016, 05/28/2016, 05/29/2016  Right IJ single-lumen power port catheter insertion per Dr. Annamaria Boots 05/24/2016  2-D echo 05/24/2016  Ultrasound-guided paracentesis - 2.9 L of clear yellow fluid removed--Dr. Earleen Newport 05/22/2016 cytology positive for adenocarcinoma  Ultrasound-guided paracentesis - 4.0 L of clear yellow fluid removed 06/06/2016  Antimicrobials:   IV Ancef 05/24/16 x 1 dose   Diflucan 12/31 -->   Subjective: Persistent abd pain, 10/10 in severity. Asking for more pain medication.   Objective: Vitals:   06/06/16 1030 06/06/16 1040 06/06/16 1050 06/06/16 1440  BP: 138/78 135/73 123/67 120/62  Pulse:    (!) 108  Resp:    18  Temp:    (!) 101 F (38.3 C)  TempSrc:    Oral  SpO2:    94%  Weight:      Height:        Intake/Output Summary (Last 24 hours) at 06/06/16 1658 Last data filed at 06/06/16 1429  Gross per 24 hour  Intake               50 ml  Output              200 ml  Net             -150 ml   Filed Weights   05/20/16 1332 05/20/16 1818 06/05/16 0621  Weight: 78 kg (171 lb 15.3 oz) 80.2 kg (176 lb 12.9 oz) 87.8 kg (193 lb 9 oz)    Examination:  General exam: Appears uncomfortable.  Respiratory system: Clear to auscultation. Respiratory effort normal. Cardiovascular system: S1 & S2 heard, RRR. No JVD, murmurs, rubs, gallops or clicks. No pedal edema. Gastrointestinal system: Abdomen is distended, soft and moderately diffusely tender. Bowel sounds  heard. Central nervous system: Alert and oriented. No focal neurological deficits. Extremities: Symmetric 4 x 5 power.  Data Reviewed: I have personally reviewed following labs and imaging studies  CBC:  Recent Labs Lab 05/31/16 1741 06/01/16 0416 06/05/16 0443 06/06/16 0411  WBC 10.0 10.6* 8.2 10.1  NEUTROABS  --  7.5 6.3  --   HGB 12.0 11.8* 10.0* 9.7*  HCT 36.0 35.8* 30.1* 29.3*  MCV 87.8 88.2 86.5 86.7  PLT 437* 411* 474* [id number]*   Basic Metabolic Panel:  Recent Labs Lab 06/01/16 0416 06/02/16 0435 06/03/16 0532 06/04/16 0727 06/05/16 0443 06/06/16 0411  NA 137 132* 132* 131* 131* 132*  K 3.8 3.7 4.4 4.5 4.3 4.6  CL 105 102 100* 100* 99* 101  CO2 26 25 26 23 25 25   GLUCOSE 136* 170* 163* 123* 144* 136*  BUN 8 14 16 19  22* 22*  CREATININE 0.56 0.52 0.55 0.34* 0.63 0.68  CALCIUM 7.9* 7.9* 8.1* 8.0* 8.0* 8.0*  MG 1.9 1.8 1.7 1.8 1.8  --   PHOS 3.5 3.2 3.2 3.4 3.7  --    Liver Function Tests:  Recent Labs Lab 06/01/16 0416 06/02/16 0435 06/03/16 0532 06/04/16 0727 06/05/16 0443  AST 12* 13* 12* 14* 14*  ALT 10* 8* 7* 8* 9*  ALKPHOS 61 64 73 78 85  BILITOT 0.6 0.4 0.4 0.5 0.3  PROT 5.2* 5.2* 5.5* 5.2* 5.0*  ALBUMIN 2.3* 2.4* 2.3* 2.2* 2.2*   CBG:  Recent Labs Lab 06/05/16 2022 06/06/16 0100 06/06/16 0442 06/06/16 0818 06/06/16 1204  GLUCAP 122* 113* 119* 127* 124*   Lipid Profile:  Recent Labs  06/05/16 0443  TRIG 76    Radiology Studies: US Paracentesis  Result Date: 06/06/2016 INDICATION: Patient with a history of metastatic ovarian cancer now with recurrent abdominal ascites. Request is made for therapeutic paracentesis. EXAM: ULTRASOUND GUIDED THERAPEUTIC PARACENTESIS MEDICATIONS: 1% lidocaine COMPLICATIONS: None immediate. PROCEDURE: Informed written consent was obtained from the patient after a discussion of the risks, benefits and alternatives to treatment. A timeout was performed prior to the initiation of the procedure. Initial  ultrasound scanning demonstrates a moderate amount of ascites within the right lower abdominal quadrant. The right lower abdomen was prepped and draped in the usual sterile fashion. 1% lidocaine was used for local anesthesia. Following this, a 19 gauge, 7-cm, Yueh catheter was introduced. An ultrasound image was saved for documentation purposes. The paracentesis was performed. The catheter was removed and a dressing was applied. The patient tolerated the procedure well without immediate post procedural complication. FINDINGS: A total of approximately 4 L of yellow fluid was removed. IMPRESSION: Successful ultrasound-guided paracentesis yielding 4 liters of peritoneal fluid. Read by: Saverio Danker, PA-C Electronically Signed   By: Sandi Mariscal M.D.   On: 06/06/2016 11:31   Dg Chest Port 1v Same Day  Result Date: 06/05/2016 CLINICAL DATA:  Shortness of Breath EXAM: PORTABLE CHEST 1 VIEW COMPARISON:  None. FINDINGS: Right Port-A-Cath tip is in the lower SVC. NG tube enters the stomach. Heart is normal size. Low lung volumes. Right perihilar and left base atelectasis. No effusions. No acute bony abnormality. IMPRESSION: Areas of atelectasis bilaterally.  Low lung volumes. Electronically Signed   By: Rolm Baptise M.D.   On: 06/05/2016 14:27   Dg Abd Portable 1v  Result Date: 06/05/2016 CLINICAL DATA:  Patient with shortness of breath. EXAM: PORTABLE ABDOMEN - 1 VIEW COMPARISON:  Abdominal radiograph 05/31/2016. FINDINGS: Enteric tube tip and side-port project over the left upper quadrant. Re- demonstrated gaseous distended loops of small bowel within the central abdomen measuring up to 5.5 cm. Gas is demonstrated within the colon. Supine evaluation limited for the detection of free intraperitoneal air. Lumbar spine degenerative changes. IMPRESSION: Re- demonstrated distended loops of small bowel within the central abdomen concerning for obstruction, potentially partial given colonic gas. Electronically Signed   By:  Lovey Newcomer M.D.   On: 06/05/2016 14:30    Scheduled Meds: . enoxaparin (LOVENOX) injection  40 mg Subcutaneous Q24H  . feeding supplement  1 Container Oral QID  . fluconazole (DIFLUCAN) IV  100 mg Intravenous Q24H  . insulin aspart  0-9 Units Subcutaneous Q6H  . pantoprazole (PROTONIX) IV  40 mg Intravenous Q24H   Continuous Infusions: . sodium chloride 10 mL/hr at 06/06/16 1430  . Marland KitchenTPN (CLINIMIX-E) Adult 70 mL/hr at 06/05/16 1726   And  . fat emulsion 240 mL (06/05/16 1726)  . Marland KitchenTPN (CLINIMIX-E) Adult     And  . fat emulsion       LOS: 16 days   Time spent: 30 minutes  Faye Ramsay, MD [facility] Pager [phone]  If 7PM-7AM, please contact night-coverage [email or web address] Password [facility] 06/06/2016, 4:58 PM

## 2016-06-06 NOTE — Progress Notes (Addendum)
PHARMACY - ADULT TOTAL PARENTERAL NUTRITION CONSULT NOTE   Pharmacy Consult for TPN Indication: partial small bowel obstruction  Patient Measurements: Height: 5\' 3"  (160 cm) Weight: 193 lb 9 oz (87.8 kg) IBW/kg (Calculated) : 52.4 TPN AdjBW (KG): 58.8 Body mass index is 34.29 kg/m.  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 Per MD note, has been 12 days since she has been able to take anything PO. She has NGT to LIWS and has failed multiple attempts to d/c NG and advance diet.  TPN to start 12/27.  Significant events:  12/26 D1C1 Doxil 40mg /m2 - tolerated well 12/27 TPN started 12/28 May allow small sips of fluids 1/2: 4L paracentesis  Today, 06/06/2016:   Glucose - no h/o DM, CBGs at goal 100-150  Electrolytes - Na remains slightly low but stable  Renal - SCr WNL, CrCl 60 ml/min  I/O - 400 ml NGT output (not significantly improved); +13L since admit  LFTs - low, stable  TGs - wnl  Prealbumin - <5 (12/28)  NUTRITIONAL GOALS                                                                                             RD recs (12/26): 1750-1950 kcal, 75-85g protein, 1.7-1.9L/day fluid Clinimix E 5/20 at a goal rate of 70 ml/hr + 20% fat emulsion at 10 ml/hr to provide: 84 g/day protein, 1958 Kcal/day.  (+++There is currently a Psychologist, prison and probation services of Clinimix solution.  Pharmacy will use Clinimix product based on what we have available in  stock+++)  PLAN  At 1800 today:  Continue Clinimix E 5/20 at goal 70 ml/hr; do not anticipate weaning to oral diet at this point  20% fat emulsion at 10 ml/hr.  TPN to contain standard multivitamins and trace elements.  IVF to [facility] will still achieve fluid goals; can increase if getting dry.  Continue sensitive SSI, will liberalize CBG checks to q6h. Can liberalize further if remains stable as TPN at goal.   TPN lab panels on Mondays & Thursdays; labs stable today, no need to recheck tomorrow   Reuel Boom, PharmD, BCPS Pager: [phone] 06/06/2016, 11:18 AM

## 2016-06-06 NOTE — Progress Notes (Signed)
I met briefly today with Ms. Keleher and her daughter, Olam Idler.  They would like to arrange for family meeting tomorrow with her other daughter, Juliann Pulse, can also be present.  We have set up a family meeting for tomorrow at 1 PM.  Please let me know if we can be of further assistance in the interim.  Micheline Rough, MD [place] Team [phone]

## 2016-06-07 DIAGNOSIS — K219 Gastro-esophageal reflux disease without esophagitis: Secondary | ICD-10-CM

## 2016-06-07 DIAGNOSIS — R140 Abdominal distension (gaseous): Secondary | ICD-10-CM

## 2016-06-07 DIAGNOSIS — T85598D Other mechanical complication of other gastrointestinal prosthetic devices, implants and grafts, subsequent encounter: Secondary | ICD-10-CM

## 2016-06-07 DIAGNOSIS — E440 Moderate protein-calorie malnutrition: Secondary | ICD-10-CM

## 2016-06-07 DIAGNOSIS — R062 Wheezing: Secondary | ICD-10-CM

## 2016-06-07 DIAGNOSIS — B370 Candidal stomatitis: Secondary | ICD-10-CM

## 2016-06-07 DIAGNOSIS — Z7189 Other specified counseling: Secondary | ICD-10-CM

## 2016-06-07 DIAGNOSIS — R509 Fever, unspecified: Secondary | ICD-10-CM

## 2016-06-07 DIAGNOSIS — R05 Cough: Secondary | ICD-10-CM

## 2016-06-07 DIAGNOSIS — B3781 Candidal esophagitis: Secondary | ICD-10-CM

## 2016-06-07 DIAGNOSIS — Z515 Encounter for palliative care: Secondary | ICD-10-CM

## 2016-06-07 LAB — CBC
HCT: 27.4 % — ABNORMAL LOW (ref 36.0–46.0)
HCT: 30 % — ABNORMAL LOW (ref 36.0–46.0)
HEMOGLOBIN: 10.2 g/dL — AB (ref 12.0–15.0)
Hemoglobin: 9.2 g/dL — ABNORMAL LOW (ref 12.0–15.0)
MCH: 28.8 pg (ref 26.0–34.0)
MCH: 29.3 pg (ref 26.0–34.0)
MCHC: 33.6 g/dL (ref 30.0–36.0)
MCHC: 34 g/dL (ref 30.0–36.0)
MCV: 85.9 fL (ref 78.0–100.0)
MCV: 86.2 fL (ref 78.0–100.0)
PLATELETS: 448 10*3/uL — AB (ref 150–400)
PLATELETS: 494 10*3/uL — AB (ref 150–400)
RBC: 3.19 MIL/uL — AB (ref 3.87–5.11)
RBC: 3.48 MIL/uL — ABNORMAL LOW (ref 3.87–5.11)
RDW: 13 % (ref 11.5–15.5)
RDW: 12.8 % (ref 11.5–15.5)
WBC: 9.7 10*3/uL (ref 4.0–10.5)
WBC: 8 10*3/uL (ref 4.0–10.5)

## 2016-06-07 LAB — URINALYSIS, ROUTINE W REFLEX MICROSCOPIC
BACTERIA UA: NONE SEEN
BILIRUBIN URINE: NEGATIVE
Glucose, UA: NEGATIVE mg/dL
HGB URINE DIPSTICK: NEGATIVE
Ketones, ur: NEGATIVE mg/dL
NITRITE: NEGATIVE
PROTEIN: NEGATIVE mg/dL
Specific Gravity, Urine: 1.008 (ref 1.005–1.030)
pH: 6 (ref 5.0–8.0)

## 2016-06-07 LAB — BASIC METABOLIC PANEL
ANION GAP: 7 (ref 5–15)
BUN: 16 mg/dL (ref 6–20)
CALCIUM: 7.8 mg/dL — AB (ref 8.9–10.3)
CO2: 27 mmol/L (ref 22–32)
Chloride: 97 mmol/L — ABNORMAL LOW (ref 101–111)
Creatinine, Ser: 0.55 mg/dL (ref 0.44–1.00)
GFR calc Af Amer: >60 mL/min (ref >60)
GFR calc non Af Amer: >60 mL/min (ref >60)
Glucose, Bld: 162 mg/dL — ABNORMAL HIGH (ref 65–99)
POTASSIUM: 4.3 mmol/L (ref 3.5–5.1)
SODIUM: 131 mmol/L — AB (ref 135–145)

## 2016-06-07 LAB — GLUCOSE, CAPILLARY
GLUCOSE-CAPILLARY: 119 mg/dL — AB (ref 65–99)
GLUCOSE-CAPILLARY: 137 mg/dL — AB (ref 65–99)

## 2016-06-07 MED ORDER — SODIUM CHLORIDE 0.9 % IV SOLN
5.0000 ug/h | INTRAVENOUS | Status: DC
  Administered 2016-06-07 – 2016-06-09 (×2): 15 ug/h via INTRAVENOUS
  Administered 2016-06-10: 10 ug/h via INTRAVENOUS
  Filled 2016-06-07 (×5): qty 1

## 2016-06-07 MED ORDER — SUCRALFATE 1 G PO TABS
1.0000 g | ORAL_TABLET | Freq: Three times a day (TID) | ORAL | Status: DC
  Administered 2016-06-07 – 2016-06-10 (×11): 1 g via ORAL
  Filled 2016-06-07 (×12): qty 1

## 2016-06-07 MED ORDER — FAT EMULSION 20 % IV EMUL
240.0000 mL | INTRAVENOUS | Status: AC
  Administered 2016-06-07: 240 mL via INTRAVENOUS
  Filled 2016-06-07: qty 240

## 2016-06-07 MED ORDER — CEFAZOLIN SODIUM-DEXTROSE 2-4 GM/100ML-% IV SOLN
2.0000 g | INTRAVENOUS | Status: AC
  Administered 2016-06-08: 2 g via INTRAVENOUS
  Filled 2016-06-07 (×2): qty 100

## 2016-06-07 MED ORDER — M.V.I. ADULT IV INJ
INJECTION | INTRAVENOUS | Status: AC
  Administered 2016-06-07: 18:00:00 via INTRAVENOUS
  Filled 2016-06-07: qty 1680

## 2016-06-07 NOTE — Consult Note (Signed)
Consultation Note Date: 06/07/2016   Patient Name: Audrey Garza  DOB: January 07, 1941  MRN: [id number]  Age / Sex: 76 y.o., female  PCP: No primary care provider on file. Referring Physician: Kerney Elbe, DO  Reason for Consultation: Establishing goals of care, Non pain symptom management and Pain control  HPI/Patient Profile: 76 y.o. female  with past medical history of ovarian cancer admitted on 05/20/2016 with bowel obstruction.  Palliative consulted for nausea related to obstruction.   Clinical Assessment and Goals of Care: I met today with patient, her 2 daughters, her son in law and her grandson.  Patient speaks Spanish and offered translator, but patient declined and stated preference that family translate.  They report that the most important things to her are her family and living as well as possible for as long as possible.  We reviewed her clinical course to this point in time.  She and her family are clear in desire to continue with current therapies at this time.  SUMMARY OF RECOMMENDATIONS   - Patient and her family report understanding that she has incurable disease.  They want to continue to pursue any treatment that may add quality and time to her life.  They are invested in continuing with current therapies including continuing chemotherapy with the hope that she will have improvement of obstruction and subsequently be able to improve her functional status. - Discussed symptom management as below. Plan for trial of octreotide. Agree with eval for venting g tube. - Family was not ready to discuss long term goals and/or CODE STATUS.  We had a preliminary discussion that her goal is to pursue interventions that are likely to add time and quality to her life, but she has a non-curable, life-limiting illness.  I introduced the concept that we are at a point where some interventions (such as  mechanical ventilation and attempting resuscitation at the time of cardiac arrest) are not likely to result in her being well enough to have meaningful time outside the hospital with her family.  Family expressed understanding, but is not ready to discuss further at this time.  Will continue to progress this conversation as patient and family are emotionally able.   Code Status/Advance Care Planning:  Full code   Symptom Management:   Nausea/abdominal discomfort: Secondary to bowel obstruction.  I agree that she will likely benefit from venting g tube for palliative purposes and recommend pursue this option.  Her main concern today is pain in throat from mucositis as well as NGT being in place.  Will plan for trial of octreotide to see if this is beneficial in relieving symptoms and decreasing output enough to clamp/remove NGT prior to venting PEG placement.  IR consulted for evaluation.  Could also consider reglan as it appears that this is not a complete obstruction, but will hold at this time as reglan can cause more discomfort/cramping if it is actually a complete obstruction.    Mucositis: unfortunately, this remains a difficult symptoms, particularly in light of NGT being in  place. Agree with carafate and dilaudid as needed.  Palliative Prophylaxis:   Bowel Regimen and Frequent Pain Assessment  Additional Recommendations (Limitations, Scope, Preferences):  Full Scope Treatment  Psycho-social/Spiritual:   Desire for further Chaplaincy support: Did not address today  Prognosis:   Unable to determine due to acute illness.  Discharge Planning: To Be Determined      Primary Diagnoses: Present on Admission: . Malignant ascites   I have reviewed the medical record, interviewed the patient and family, and examined the patient. The following aspects are pertinent.  Past Medical History:  Diagnosis Date  . Cancer [facility])    Social History   Social History  . Marital status:  Unknown    Spouse name: N/A  . Number of children: N/A  . Years of education: N/A   Social History Main Topics  . Smoking status: Never Smoker  . Smokeless tobacco: Never Used  . Alcohol use No  . Drug use: Unknown  . Sexual activity: Not Asked   Other Topics Concern  . None   Social History Narrative  . None   History reviewed. No pertinent family history. Scheduled Meds: . [START ON 06/08/2016]  ceFAZolin (ANCEF) IV  2 g Intravenous to XRAY  . enoxaparin (LOVENOX) injection  40 mg Subcutaneous Q24H  . fluconazole (DIFLUCAN) IV  100 mg Intravenous Q24H  . pantoprazole (PROTONIX) IV  40 mg Intravenous Q24H  . sucralfate  1 g Oral TID WC & HS   Continuous Infusions: . sodium chloride 10 mL/hr at 06/06/16 1430  . Marland KitchenTPN (CLINIMIX-E) Adult 70 mL/hr at 06/07/16 1730   And  . fat emulsion 240 mL (06/07/16 1730)  . octreotide  (SANDOSTATIN)    IV infusion 15 mcg/hr (06/07/16 1628)   PRN Meds:.acetaminophen (TYLENOL) oral liquid 160 mg/5 mL, albuterol, alteplase, alum & mag hydroxide-simeth, butamben-tetracaine-benzocaine, heparin lock flush, heparin lock flush, hydrALAZINE, HYDROmorphone (DILAUDID) injection, ketorolac, lidocaine, LORazepam, menthol-cetylpyridinium, ondansetron (ZOFRAN) IV, oxyCODONE-acetaminophen, phenol, promethazine, simethicone, sodium chloride flush, sodium chloride flush, sodium chloride flush, zolpidem Medications Prior to Admission:  Prior to Admission medications   Medication Sig Start Date End Date Taking? Authorizing Provider  acetaminophen (TYLENOL) 325 MG tablet Take 650 mg by mouth every 5 (five) hours as needed for moderate pain.   Yes Historical Provider, MD  magnesium 30 MG tablet Take 30 mg by mouth 2 (two) times daily.   Yes Historical Provider, MD  Potassium 75 MG TABS Take 75 mg by mouth daily.   Yes Historical Provider, MD  simethicone (GAS-X) 80 MG chewable tablet Chew 160 mg by mouth every 6 (six) hours as needed for flatulence.   Yes Historical  Provider, MD  lidocaine-prilocaine (EMLA) cream Apply to port 1 hour before access with needle. Cover with plastic wrap. 05/30/16   Lennis Marion Downer, MD  ondansetron (ZOFRAN ODT) 8 MG disintegrating tablet Take 1 tablet (8 mg total) by mouth every 8 (eight) hours as needed for nausea or vomiting. 05/30/16   Lennis Marion Downer, MD   No Known Allergies Review of Systems  Constitutional: Positive for activity change, appetite change, fatigue and unexpected weight change.  HENT: Positive for mouth sores, sore throat and trouble swallowing.   Gastrointestinal: Positive for abdominal distention, abdominal pain and nausea.  Musculoskeletal: Positive for back pain and neck pain.  Neurological: Positive for weakness.  Psychiatric/Behavioral: Positive for sleep disturbance.      Physical Exam  General: Alert, awake, in no acute distress. NGT in place HEENT: No bruits, no  goiter, no JVD, + mucositis Heart: Regular rate and rhythm. No murmur appreciated. Lungs: Good air movement, crackles scattered in bases Abdomen: Soft, + distended, positive bowel sounds.  Ext: + LE edema Skin: Warm and dry Neuro: Grossly intact, nonfocal.    Vital Signs: BP (!) 108/57 (BP Location: Left Arm)   Pulse 91   Temp 98.3 F (36.8 C) (Oral)   Resp 17   Ht '5\' 3"'$  (1.6 m)   Wt 87.8 kg (193 lb 9 oz)   SpO2 97%   BMI 34.29 kg/m  Pain Assessment: 0-10 POSS *See Group Information*: S-Acceptable,Sleep, easy to arouse Pain Score: 0-No pain   SpO2: SpO2: 97 % O2 Device:SpO2: 97 % O2 Flow Rate: .   IO: Intake/output summary:  Intake/Output Summary (Last 24 hours) at 06/07/16 1814 Last data filed at 06/07/16 1309  Gross per 24 hour  Intake          1219.33 ml  Output              150 ml  Net          1069.33 ml    LBM: Last BM Date: 06/05/16 Baseline Weight: Weight: 78 kg (171 lb 15.3 oz) Most recent weight: Weight: 87.8 kg (193 lb 9 oz)     Palliative Assessment/Data:   Flowsheet Rows   Flowsheet  Row Most Recent Value  Intake Tab  Referral Department  Hospitalist  Unit at Time of Referral  Oncology Unit  Palliative Care Primary Diagnosis  Cancer  Date Notified  06/05/16  Palliative Care Type  New Palliative care  Reason for referral  Clarify Goals of Care, Non-pain Symptom, Pain  Date of Admission  05/20/16  Date first seen by Palliative Care  06/06/16  # of days Palliative referral response time  1 Day(s)  # of days IP prior to Palliative referral  16  Clinical Assessment  Palliative Performance Scale Score  40%  Pain Max last 24 hours  9  Pain Min Last 24 hours  4  Psychosocial & Spiritual Assessment  Palliative Care Outcomes  Patient/Family meeting held?  Yes  Who was at the meeting?  Patient, 2 daughters, son in law, grandson      Time In: 37 Time Out: 1400 Time Total: 46 Greater than 50%  of this time was spent counseling and coordinating care related to the above assessment and plan.  Signed by: Micheline Rough, MD   Please contact Palliative Medicine Team phone at [phone] for questions and concerns.  For individual provider: See Shea Evans

## 2016-06-07 NOTE — Consult Note (Signed)
Patient seen today in consultation at the request of Dr. Marko Plume.  Oncologic History Patient had hysterectomy without oophorectomy in her 84s for benign indication, and been in her usual good health until July 2016, when she developed abdominal discomfort and bloating. In late 03-2015 she was found to have large pelvic mass and perihepatic ascites. CA 125 then was 359, with CEA of 3, CA 19-9 5.8 and alpha fetoprotein 2.3. She had exploratory laparotomy 04-19-15, which was not done by gyn oncologist, with bilateral ovaried an tumors, 10 cm with capsule ruptured on right , smaller on left, peritoneal carcinomatosis. Washings were done, however accompanying information does not describe extent of debulking. She had some bowel obstruction post operatively, with NG used ~ POD 3. Pathology available 05-05-15 showed cystic and solid left ovarian mass, 13 cm right ovarian mass, moderately differentiated tumor "tumor de celulas de la granulosa". 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 CT AP 122016 had ascites, carcinomatosis and multiple peritoneal implants. CA 125 on 05-24-15 baseline for chemo was 141. She received 3 cycles of taxol + carboplatin from 05-26-15 thru 07-07-15 (not clear if avastin given those cycles), after which carboplatin was not available in [place]. She received taxol 175 mg/m2, CDDP and avastin for 3 additional cycles thru 10-07-15. Second look laparotomy was discussed, patient declined, and instead had 4 cycles avastin + taxol from 12-01-15 thru 02-02-16 (family is confirming, but we believe no CDDP after 10-07-15 and no carboplatin after 07-07-15). CT CAP 02-24-16 reportedly had no evidence of malignancy, with some bullous emphysema, tortuous sigmoid colon with diverticulosis. . Recommendation was for maintenance avastin x 1 year, however patient then came to [facility] in 02-2016. She has not established with any physicians since  coming to [place]. CT AP 05-20-16 lias arge volume ascites, extensive peritoneal disease with omental thickening, trace bilateral pleural effusions, left colonic diverticulosis, bilateral inguinal hernias containing fat, post hysterectomy, no adnexal masses, no adenopathy. US paracentesis 05-22-16 for 2.9 liters of yellow fluid. Cytology adenocarcinoma withserous features, immunohistochemical stains confirm gyn. First doxil given 05-30-16, chosen due to side effect profile but note may still be platinum sensitive.   She had a recurrent paracentesis yesterday.  I was asked to see her for recommendations regarding current management.  The patient herself is otherwise without significant complaints of exception of throat pain from which she describes as the NG tube. She continues to pass gas every day. She has minimal abdominal pain. She does have distention which is improved after her high-volume paracentesis yesterday.  Physical exam: Well-appearing female with an NG tube in hospital bed.  Abdomen: Well-healed surgical incisions with no obvious incisional hernias. Positive fluid wave. Normal active bowel sounds. Distended and tympanitic.  Assessment: Advanced ovarian cancer status post bilateral salpingo-oophorectomy and some type of debulking procedure in [place]. She received multiple rounds of chemotherapy with her last chemotherapy in August 2017. She now has recurrent disease with large volume ascites, diffuse carcinomatosis. She is status post cycle #1 of liposomal doxorubicin. She has an NG tube in and started TPN on December 27.  Plan: 1) I would recommend consideration of an apsira drain or Pleuryx catheter for continuous ability to drain her ascites which is reaccumulating. Clinically on exam today she does have a palpable fluid wave. I did speak with vascular radiology and they will most likely start with catheter placement tomorrow and see if that helps with her symptoms prior to moving to  a G-tube.  If high-volume repeated drainage of her ascites does not help with her GI function and will consider G-tube placement after proceeding with repeat imaging to ensure that there is an appropriate track for the G-tube.  2) In speaking with the patient and her daughter she does pass gas continuously she has been tolerating short-term NG-tube clamping without nausea and vomiting. I would recommend that her NG-tube be clamped overnight and if she tolerates that for the next 12 hours without nausea and vomiting that she be allowed to clears around her NG tube. If that is tolerated I would DC the NG tube.  3) I agree with the symptomatic recommendations that she has for her throat pain which could either be from the NG tube and or mucositis from her chemotherapy. I suspect is a combination of both. She can have the Chloraseptic to bedside so that she can use that as needed when necessary. I do not see it at her bedside today.  4) GYN oncology will continue to follow. Please feel free to call us when necessary [phone].

## 2016-06-07 NOTE — Progress Notes (Signed)
PHARMACY - ADULT TOTAL PARENTERAL NUTRITION CONSULT NOTE   Pharmacy Consult for TPN Indication: partial small bowel obstruction  Patient Measurements: Height: 5\' 3"  (160 cm) Weight: 193 lb 9 oz (87.8 kg) IBW/kg (Calculated) : 52.4 TPN AdjBW (KG): 58.8 Body mass index is 34.29 kg/m.  Insulin Requirements: 5 units Novolog in the past 24hr  Current Nutrition: NPO; TPN per yesterday's note - ordered Ensure but refusing (NGT set to drain anyway, so would not receive any nutrition even if taking)  IVF: NS at 10 ml/hr KVO  Central access: implanted port TPN start date: 12/27  ASSESSMENT                                                                                                          HPI: 7 yoF with h/o metastatic ovarian cancer with extensive peritoneal disease with omental thickening with large malignant ascites and partial SBO likely 2/2 metastatic disease. Per MD note, has been 12 days since she has been able to take anything PO. She has NGT to LIWS and has failed multiple attempts to d/c NG and advance diet.  TPN to start 12/27.  Significant events:  12/26 D1C1 Doxil 40mg /m2 - tolerated well 12/27 TPN started 12/28 May allow small sips of fluids 1/2: 4L paracentesis  Today, 06/07/2016:   Glucose - no h/o DM, CBGs at goal 100-150  Electrolytes - Na remains slightly low but stable  Renal - SCr WNL, CrCl 60 ml/min  I/O - 150 ml NGT output (worsening); +~15L since admit  LFTs - low, stable  TGs - wnl  Prealbumin - <5 (12/28)  NUTRITIONAL GOALS                                                                                             RD recs (12/26): 1750-1950 kcal, 75-85g protein, 1.7-1.9L/day fluid Clinimix E 5/20 at a goal rate of 70 ml/hr + 20% fat emulsion at 10 ml/hr to provide: 84 g/day protein, 1958 Kcal/day.  (+++There is currently a Psychologist, prison and probation services of Clinimix solution.  Pharmacy will use Clinimix product based on what we have available in  stock+++)  PLAN  At 1800 today:  Continue Clinimix E 5/20 at goal 70 ml/hr; do not anticipate weaning to oral diet at this point  20% fat emulsion at 10 ml/hr.  TPN to contain standard multivitamins and trace elements.  IVF to [facility] will still achieve fluid goals; can increase if getting dry.  Due to patient's status, will stop CBG checks and SSI  TPN lab panels on Mondays & Thursdays; labs stable today, no need to recheck tomorrow    Adrian Saran, PharmD, BCPS Pager [phone] 06/07/2016 10:16 AM

## 2016-06-07 NOTE — Consult Note (Signed)
Chief Complaint: Patient was seen in consultation today for recurrent ascites  Referring Physician(s):  Dr. Nancy Marus  Supervising Physician: Markus Daft  Patient Status: Audrey Garza - [place] - In-pt  History of Present Illness: Audrey Garza is a 76 y.o. female with a history of metastatic ovarian cancer diagnosed in Jan 2017.  She has been managed with chemotherapy; last dose Sept 2017.  For the past 3 weeks she has had progressive abdominal pain with distention and was admitted 05/20/16 with a SBO.  Currently she remains on unit and is being treated conservatively with NGT for decompression.   Patient is familiar to Radiology due to recent paracentesis and Port-A-Cath placement.  Last paracentesis performed 06/06/16 with 4 L removed.   IR consulted for G-tube vs. Pleurx catheter placement in patient with recurrent ascites and abdominal distention. Discussed case with Dr. Alycia Rossetti and Dr. Anselm Pancoast who agree that Pleurx catheter placement as first approach may provide symptom relief from recurrent ascites and alleviate some discomfort from distention.  Patient has inconsistently tolerated clamping trials in the past with last attempt at clamping trial being 05/28/16.  She continues to pass gas and will re-attempt clamp trial tonight.   Patient has been NPO.  She is on lovenox.   Past Medical History:  Diagnosis Date  . Cancer [facility])     Past Surgical History:  Procedure Laterality Date  . ABDOMINAL HYSTERECTOMY    . IR GENERIC HISTORICAL  05/24/2016   IR FLUORO GUIDE PORT INSERTION RIGHT 05/24/2016 Greggory Keen, MD [facility]  . IR GENERIC HISTORICAL  05/24/2016   IR US GUIDE VASC ACCESS RIGHT 05/24/2016 Greggory Keen, MD [facility]    Allergies: Patient has no known allergies.  Medications: Prior to Admission medications   Medication Sig Start Date End Date Taking? Authorizing Provider  acetaminophen (TYLENOL) 325 MG tablet Take 650 mg by mouth every 5 (five) hours as needed for moderate  pain.   Yes Historical Provider, MD  magnesium 30 MG tablet Take 30 mg by mouth 2 (two) times daily.   Yes Historical Provider, MD  Potassium 75 MG TABS Take 75 mg by mouth daily.   Yes Historical Provider, MD  simethicone (GAS-X) 80 MG chewable tablet Chew 160 mg by mouth every 6 (six) hours as needed for flatulence.   Yes Historical Provider, MD  lidocaine-prilocaine (EMLA) cream Apply to port 1 hour before access with needle. Cover with plastic wrap. 05/30/16   Lennis Marion Downer, MD  ondansetron (ZOFRAN ODT) 8 MG disintegrating tablet Take 1 tablet (8 mg total) by mouth every 8 (eight) hours as needed for nausea or vomiting. 05/30/16   Lennis Marion Downer, MD     History reviewed. No pertinent family history.  Social History   Social History  . Marital status: Unknown    Spouse name: N/A  . Number of children: N/A  . Years of education: N/A   Social History Main Topics  . Smoking status: Never Smoker  . Smokeless tobacco: Never Used  . Alcohol use No  . Drug use: Unknown  . Sexual activity: Not Asked   Other Topics Concern  . None   Social History Narrative  . None    Review of Systems  Constitutional: Positive for activity change and fatigue.  Respiratory: Negative for cough and shortness of breath.   Cardiovascular: Negative for chest pain.  Gastrointestinal: Positive for abdominal distention and abdominal pain.  Psychiatric/Behavioral: Negative for behavioral problems and confusion.    Vital Signs: BP  137/70 (BP Location: Left Arm)   Pulse 98   Temp 99.5 F (37.5 C) (Oral)   Resp 18   Ht 5\' 3"  (1.6 m)   Wt 193 lb 9 oz (87.8 kg)   SpO2 97%   BMI 34.29 kg/m   Physical Exam  Constitutional: She is oriented to person, place, and time. She appears well-developed.  Cardiovascular: Normal rate, regular rhythm and normal heart sounds.   Pulmonary/Chest: Effort normal and breath sounds normal. No respiratory distress.  Abdominal: She exhibits distension. There is  tenderness.  Small fluid wave present   Neurological: She is alert and oriented to person, place, and time.  Skin: Skin is warm and dry.  Psychiatric: She has a normal mood and affect. Her behavior is normal. Judgment and thought content normal.  Nursing note and vitals reviewed.   Mallampati Score:  MD Evaluation Airway: WNL Heart: WNL Abdomen: WNL Chest/ Lungs: WNL ASA  Classification: 3 Mallampati/Airway Score: Two  Imaging: Dg Abd 1 View  Result Date: 05/29/2016 CLINICAL DATA:  76 year old female with nasogastric tube placement. Subsequent encounter. EXAM: ABDOMEN - 1 VIEW COMPARISON:  05/29/2016. FINDINGS: Nasogastric tube tip curled upon itself in the distal esophagus and needs to be reposition. Gas distended small bowel loops measuring up to 6.8 cm consistent with small bowel obstruction similar to earlier exam. The possibility of free intraperitoneal air cannot be assessed on a supine view. IMPRESSION: Nasogastric tube tip curled upon itself in the distal esophagus and needs to be reposition. Small bowel obstruction. These results will be called to the ordering clinician or representative by the Radiologist Assistant, and communication documented in the PACS or zVision Dashboard. Electronically Signed   By: Genia Del M.D.   On: 05/29/2016 14:46   Dg Abd 1 View  Result Date: 05/28/2016 CLINICAL DATA:  Increased abdominal distention and patient with recent small bowel obstruction. Status post NG tube removal. EXAM: ABDOMEN - 1 VIEW COMPARISON:  Two views of the abdomen 05/27/2016. FINDINGS: NG tube has been removed. There is new dilatation of small bowel loops up to 4.9 cm. No evidence of free air on single view of the abdomen. IMPRESSION: New gaseous distention of small bowel worrisome for recurrent obstruction. Electronically Signed   By: Inge Rise M.D.   On: 05/28/2016 10:36   Dg Abd 1 View  Result Date: 05/23/2016 CLINICAL DATA:  Assess nasogastric tube  positioning. EXAM: ABDOMEN - 1 VIEW COMPARISON:  Chest x-ray of earlier today. FINDINGS: The nasogastric tube has been partially withdrawn such that the proximal port is at or just below the GE junction. The distal port lies in the proximal gastric fundus. There is a small amount of gas within the stomach. There is moderate gaseous distention of small-bowel loops in the mid abdomen. IMPRESSION: The nasogastric tubes proximal port is at or just below the GE junction. Advancement by 5 cm is recommended to assure that the proximal port does remain below the GE junction. Distended small bowel loops consistent with obstruction. Electronically Signed   By: David  Martinique M.D.   On: 05/23/2016 15:10   Dg Abd 1 View  Result Date: 05/23/2016 CLINICAL DATA:  Diffuse abdominal pain with nausea and vomiting EXAM: ABDOMEN - 1 VIEW COMPARISON:  CT abdomen and pelvis May 20, 2016 FINDINGS: Multiple loops of dilated bowel raise concern for a degree of bowel obstruction. No free air evident. There is moderate stool in the colon. IMPRESSION: Bowel gas pattern concerning for obstruction.  No free air  evident. These results will be called to the ordering clinician or representative by the Radiologist Assistant, and communication documented in the PACS or zVision Dashboard. Electronically Signed   By: Lowella Grip III M.D.   On: 05/23/2016 09:28   Ct Chest Wo Contrast  Result Date: 06/06/2016 CLINICAL DATA:  History of metastatic ovarian cancer presents with progressive abdominal bloating and pain for 3 weeks. Fever and dyspnea EXAM: CT CHEST WITHOUT CONTRAST TECHNIQUE: Multidetector CT imaging of the chest was performed following the standard protocol without IV contrast. COMPARISON:  Chest x-ray 06/05/2016, CT abdomen pelvis 05/20/2016 FINDINGS: Cardiovascular: Limited evaluation without the presence of intravenous contrast. Atherosclerotic calcifications of the aorta. Right-sided central venous catheter is present,  the tip terminates in the distal SVC. There are coronary artery calcifications. Heart size nonenlarged. No significant pericardial effusion. Mediastinum/Nodes: Imaged thyroid gland within normal limits. Trachea is midline. Esophageal tube is present and there is a moderate hiatal hernia, the tip terminates in the proximal stomach. Limited assessment for hilar adenopathy without contrast. Subcentimeter nonspecific mediastinal lymph nodes. Lungs/Pleura: Small bilateral pleural effusions, increased compared to prior CT, larger on the left side. Interim development of partial consolidation within the bilateral lower lobes. Few mild foci of ground-glass density within the apical portion of the right upper lobe and the subpleural anterior left upper lobe which may reflect small foci of infection or inflammation. No pneumothorax. Upper Abdomen: Moderate volume ascites in the upper abdomen. Extensive mesenteric and omental nodularity as noted on prior CT and consistent with metastatic disease. Gallbladder appears dilated. Musculoskeletal: No suspicious bone lesions. IMPRESSION: 1. Small left greater than right pleural effusions, increased compared to prior CT. Interim development of partial consolidations within the bilateral lower lobes which may reflect atelectasis or pneumonia. Few scattered foci of ground-glass density within the right upper and left upper lobes, suspect small foci of inflammation or infection. 2. Moderate to large volume of ascites in the upper abdomen with extensive mesenteric and omental metastatic disease partially visualized. 3. Dilated gallbladder. Electronically Signed   By: Donavan Foil M.D.   On: 06/06/2016 20:07   Ct Abdomen Pelvis W Contrast  Result Date: 05/20/2016 CLINICAL DATA:  Abdominal distention, pain. EXAM: CT ABDOMEN AND PELVIS WITH CONTRAST TECHNIQUE: Multidetector CT imaging of the abdomen and pelvis was performed using the standard protocol following bolus administration of  intravenous contrast. CONTRAST:  130mL ISOVUE-300 IOPAMIDOL (ISOVUE-300) INJECTION 61% COMPARISON:  None. FINDINGS: Lower chest: Bibasilar atelectasis. Heart is normal size. Moderate-sized hiatal hernia. Trace bilateral effusions. Hepatobiliary: No focal hepatic abnormality. Gallbladder unremarkable. Pancreas: No focal abnormality or ductal dilatation. Spleen: No focal abnormality.  Normal size. Adrenals/Urinary Tract: Small cyst in the midpole of the left kidney. No hydronephrosis. Adrenal glands and urinary bladder unremarkable. Stomach/Bowel: Descending colonic and sigmoid diverticulosis. No active diverticulitis. Stomach and small bowel unremarkable. Vascular/Lymphatic: Aortic and iliac calcifications. No aneurysm. No adenopathy. Reproductive: Prior hysterectomy.  No adnexal masses. Other: There is large volume ascites in the abdomen or pelvis. Extensive abnormal soft tissue throughout the omentum R with peritoneal malignancy. No free air. Bilateral inguinal hernias containing fat. Musculoskeletal: No acute bony abnormality or focal bone lesion. IMPRESSION: Extensive peritoneal disease with omental thickening/ 18 and large volume ascites. 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 Trace bilateral pleural effusions. Moderate hiatal hernia. Bilateral inguinal hernias containing fat. Left colonic diverticulosis. Aortoiliac atherosclerosis. Electronically Signed   By: Rolm Baptise M.D.   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 Evaluate for paracentesis. EXAM: LIMITED ABDOMEN ULTRASOUND FOR ASCITES TECHNIQUE: Limited ultrasound survey for ascites was performed in all four abdominal quadrants. COMPARISON:  CT abdomen and pelvis 05/20/2016. FINDINGS: Small volume of abdominal and pelvic ascites is identified and inadequate for paracentesis. IMPRESSION: As above.  Electronically Signed   By: Inge Rise M.D.   On: 05/28/2016 11:12   US Abdomen Port  Result Date: 06/04/2016 CLINICAL DATA:  Progressive abdominal distension over the last 3 weeks. History of metastatic ovarian cancer. EXAM: ABDOMEN ULTRASOUND COMPLETE COMPARISON:  Ultrasound 05/28/2016.  CT 05/20/2016. FINDINGS: Gallbladder: No gallstones or wall thickening visualized. No sonographic Murphy sign noted by sonographer. Common bile duct: Diameter: 3 mm, normal Liver: No focal lesion identified. Within normal limits in parenchymal echogenicity. IVC: No abnormality visualized. Pancreas: Not visualized because of bowel gas. Spleen: Size and appearance within normal limits. Right Kidney: Length: 9.6 cm. Echogenicity within normal limits. No mass or hydronephrosis visualized. Left Kidney: Length: 9 x 1 cm. Echogenicity within normal limits. No mass or hydronephrosis visualized. Abdominal aorta: No aneurysm visualized. Other findings: Moderate to large amount of ascites freely distributed in all 4 quadrants. Increased since 12/24. IMPRESSION: Increased amount of ascites, freely distributed in all 4 quadrants. No other acute finding by ultrasound. Electronically Signed   By: Nelson Chimes M.D.   On: 06/04/2016 16:11   US Paracentesis  Result Date: 06/06/2016 INDICATION: Patient with a history of metastatic ovarian cancer now with recurrent abdominal ascites. Request is made for therapeutic paracentesis. EXAM: ULTRASOUND GUIDED THERAPEUTIC PARACENTESIS MEDICATIONS: 1% lidocaine COMPLICATIONS: None immediate. PROCEDURE: Informed written consent was obtained from the patient after a discussion of the risks, benefits and alternatives to treatment. A timeout was performed prior to the initiation of the procedure. Initial ultrasound scanning demonstrates a moderate amount of ascites within the right lower abdominal quadrant. The right lower abdomen was prepped and draped in the usual sterile fashion. 1% lidocaine was  used for local anesthesia. Following this, a 19 gauge, 7-cm, Yueh catheter was introduced. An ultrasound image was saved for documentation purposes. The paracentesis was performed. The catheter was removed and a dressing was applied. The patient tolerated the procedure well without immediate post procedural complication. FINDINGS: A total of approximately 4 L of yellow fluid was removed. IMPRESSION: Successful ultrasound-guided paracentesis yielding 4 liters of peritoneal fluid. Read by: Saverio Danker, PA-C Electronically Signed   By: Sandi Mariscal M.D.   On: 06/06/2016 11:31   US Paracentesis  Result Date: 05/22/2016 INDICATION: Patient with metastatic ovarian cancer and malignant ascites presents for diagnostic and therapeutic paracentesis. EXAM: ULTRASOUND GUIDED DIAGNOSTIC AND THERAPEUTIC PARACENTESIS MEDICATIONS: 10 mL 1% lidocaine COMPLICATIONS: None immediate. PROCEDURE: Informed written consent was obtained from the patient after a discussion of the risks, benefits and alternatives to treatment. A timeout was performed prior to the initiation of the procedure. Initial ultrasound scanning demonstrates a large amount of ascites within the right lower abdominal quadrant. The right lower abdomen was prepped and draped in the usual sterile fashion. 1% lidocaine was used for local anesthesia. Following this, a 19 gauge, 7-cm, Yueh catheter was introduced. An ultrasound image was saved for documentation purposes. The paracentesis was performed. The catheter was removed and a dressing was applied. The patient tolerated the procedure well without immediate post procedural complication. FINDINGS: A total of approximately 2.9 liters of clear, yellow fluid was removed. Samples were sent to the laboratory  as requested by the clinical team. IMPRESSION: Successful ultrasound-guided paracentesis yielding 2.9 liters of peritoneal fluid. Read by:  Brynda Greathouse PA-C Electronically Signed   By: Corrie Mckusick D.O.   On:  05/22/2016 12:49   Ir US Guide Vasc Access Right  Result Date: 05/24/2016 CLINICAL DATA:  PERITONEAL CARCINOMA WITH ASCITES, ACCESS FOR CHEMO EXAM: RIGHT INTERNAL JUGULAR SINGLE LUMEN POWER PORT CATHETER INSERTION Date:  12/20/201712/20/2017 2:46 pm Radiologist:  M. Daryll Brod, MD Guidance:  Ultrasound and fluoroscopic MEDICATIONS: 2 g Ancef; The antibiotic was administered within an appropriate time interval prior to skin puncture. ANESTHESIA/SEDATION: Versed 2.0 mg IV; Fentanyl 100 mcg IV; Moderate Sedation Time:  28 minutes The patient was continuously monitored during the procedure by the interventional radiology nurse under my direct supervision. FLUOROSCOPY TIME:  , 30 seconds (4 mGy) COMPLICATIONS: None immediate. CONTRAST:  None. PROCEDURE: Informed consent was obtained from the patient following explanation of the procedure, risks, benefits and alternatives. The patient understands, agrees and consents for the procedure. All questions were addressed. A time out was performed. Maximal barrier sterile technique utilized including caps, mask, sterile gowns, sterile gloves, large sterile drape, hand hygiene, and 2% chlorhexidine scrub. Under sterile conditions and local anesthesia, right internal jugular micropuncture venous access was performed. Access was performed with ultrasound. Images were obtained for documentation. A guide wire was inserted followed by a transitional dilator. This allowed insertion of a guide wire and catheter into the IVC. Measurements were obtained from the SVC / RA junction back to the right IJ venotomy site. In the right infraclavicular chest, a subcutaneous pocket was created over the second anterior rib. This was done under sterile conditions and local anesthesia. 1% lidocaine with epinephrine was utilized for this. A 2.5 cm incision was made in the skin. Blunt dissection was performed to create a subcutaneous pocket over the right pectoralis major muscle. The pocket was  flushed with saline vigorously. There was adequate hemostasis. The port catheter was assembled and checked for leakage. The port catheter was secured in the pocket with two retention sutures. The tubing was tunneled subcutaneously to the right venotomy site and inserted into the SVC/RA junction through a valved peel-away sheath. Position was confirmed with fluoroscopy. Images were obtained for documentation. The patient tolerated the procedure well. No immediate complications. Incisions were closed in a two layer fashion with 4 - 0 Vicryl suture. Dermabond was applied to the skin. The port catheter was accessed, blood was aspirated followed by saline and heparin flushes. Needle was removed. A dry sterile dressing was applied. IMPRESSION: Ultrasound and fluoroscopically guided right internal jugular single lumen power port catheter insertion. Tip in the SVC/RA junction. Catheter ready for use. Electronically Signed   By: Jerilynn Mages.  Shick M.D.   On: 05/24/2016 14:59   Dg Chest Port 1v Same Day  Result Date: 06/05/2016 CLINICAL DATA:  Shortness of Breath EXAM: PORTABLE CHEST 1 VIEW COMPARISON:  None. FINDINGS: Right Port-A-Cath tip is in the lower SVC. NG tube enters the stomach. Heart is normal size. Low lung volumes. Right perihilar and left base atelectasis. No effusions. No acute bony abnormality. IMPRESSION: Areas of atelectasis bilaterally.  Low lung volumes. Electronically Signed   By: Rolm Baptise M.D.   On: 06/05/2016 14:27   Dg Abd 2 Views  Result Date: 05/31/2016 CLINICAL DATA:  Abdominal bloating. History of ovarian carcinoma. Small bowel obstruction. EXAM: ABDOMEN - 2 VIEW COMPARISON:  May 30, 2016 FINDINGS: Supine and upright images obtained. Nasogastric tube tip and side  port are in the stomach. There are multiple loops of dilated small bowel multiple air-fluid levels. Minimal areas seen in the rectum. No free air is evident. IMPRESSION: Bowel gas pattern appearance is consistent with small bowel  obstruction. No evident free air. Nasogastric tube tip and side port in stomach. Appearance is similar to 1 day prior. Electronically Signed   By: Lowella Grip III M.D.   On: 05/31/2016 09:33   Dg Abd 2 Views  Result Date: 05/30/2016 CLINICAL DATA:  Followup of small bowel obstruction. Nasogastric tube in place. EXAM: ABDOMEN - 2 VIEW COMPARISON:  1 day prior FINDINGS: Supine and right-sided decubitus views. The right-sided decubitus view demonstrates no free intraperitoneal air. Supine view demonstrates a nasogastric terminating at the body of the stomach. Gaseous distension of small bowel loops. Example loop in the mid abdomen measures 5.7 cm today versus 7.1 cm on the prior. Paucity of colonic gas. Minimal rectal gas. IMPRESSION: Improvement in high-grade small bowel obstruction. Electronically Signed   By: Abigail Miyamoto M.D.   On: 05/30/2016 08:47   Dg Abd 2 Views  Result Date: 05/29/2016 CLINICAL DATA:  History of small bowel obstruction and abdominal distension. Diagnosed with ovarian cancer 1 year ago EXAM: ABDOMEN - 2 VIEW COMPARISON:  05/28/2016 and CT 05/20/2016 FINDINGS: Exam demonstrates multiple air-filled dilated small bowel loops measuring 6.8 cm in diameter. Several air-fluid levels on the upright film. Findings are slightly worse. No free peritoneal air. Minimal air within the colon. Mild degenerate change of the spine and hips. 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 Electronically Signed   By: Marin Olp M.D.   On: 05/29/2016 09:38   Dg Abd 2 Views  Result Date: 05/27/2016 CLINICAL DATA:  Follow-up small bowel obstruction. EXAM: ABDOMEN - 2 VIEW COMPARISON:  05/25/2016 FINDINGS: Nasogastric tube in the left upper abdomen and probably in the stomach. Negative for free air on the upright view. Difficult to exclude patchy densities  in the right lower lung. The dilated gas-filled loops of small bowel have resolved. There is a gas and stool in the colon. Stable oval-shaped sclerotic density in the left iliac bone. IMPRESSION: Dilated small bowel loops are no longer identified. Findings are compatible with a resolving or resolved small bowel obstruction. Vague densities at the right lung base and cannot exclude right lung disease. Electronically Signed   By: Markus Daft M.D.   On: 05/27/2016 13:01   Dg Abd Portable 1v  Result Date: 05/29/2016 CLINICAL DATA:  NG tube repositioning EXAM: PORTABLE ABDOMEN - 1 VIEW COMPARISON:  05/29/2016 1335 hours FINDINGS: NG tube tip is now on the fundus of the stomach. Dilated small bowel loops persist across the abdomen. There is also gas within colon. There is no obvious free intraperitoneal gas. IMPRESSION: NG tube tip is now all in the fundus of the stomach. Distended loops of small and large bowel persists. Electronically Signed   By: Marybelle Killings M.D.   On: 05/29/2016 15:55   Dg Abd Portable 1v  Result Date: 05/25/2016 CLINICAL DATA:  Initial evaluation for small bowel obstruction. EXAM: PORTABLE ABDOMEN - 1 VIEW COMPARISON:  Prior radiograph from 05/24/2016. FINDINGS: Enteric tube remains in place with side hole at or just beyond the level of the GE junction. Multiple dilated gas-filled loops of small bowel again seen within the abdomen. These measure up to 6.4 cm in diameter. Visualized loops of colon  within normal limits. No definite free air on this limited supine view the abdomen. No soft tissue mass or abnormal calcification. Osseous structures unchanged. IMPRESSION: 1. Persistent gas filled distended loops of small bowel within the mid and lower abdomen, compatible with small bowel obstruction. Overall, the appearance is interval changed relative to 05/24/2016. 2. NG tube in place. Electronically Signed   By: Jeannine Boga M.D.   On: 05/25/2016 06:07   Dg Abd Portable 1v  Result  Date: 05/24/2016 CLINICAL DATA:  NG decompression of partial bowel obstruction/ileus. EXAM: PORTABLE ABDOMEN - 1 VIEW COMPARISON:  One-view abdomen 05/23/2016. FINDINGS: The side port of the NG tube appears to be at or just beyond the GE junction. Stomach is decompressed. Several loops of small small bowel in the central abdomen demonstrate increased distention compared to the based recent films. Some gas gas is again noted in the hepatic flexure of the colon. IMPRESSION: Increased distention of small bowel compatible with small bowel obstruction despite NG tube are placement. Electronically Signed   By: San Morelle M.D.   On: 05/24/2016 15:35   Dg Abd Portable 1v  Result Date: 05/23/2016 CLINICAL DATA:  NG tube placement. EXAM: PORTABLE ABDOMEN - 1 VIEW COMPARISON:  05/23/2016 at 0858 hours FINDINGS: An enteric tube has been placed and overlies the gastric body with tip and side hole well beyond the GE junction. The right side of the abdomen was incompletely imaged, however small bowel dilatation is similar to the study from earlier today. IMPRESSION: 1. Enteric tube overlies the stomach. 2. Unchanged small bowel dilatation concerning for obstruction. Electronically Signed   By: Logan Bores M.D.   On: 05/23/2016 12:37   Dg Abd Portable 1v  Result Date: 06/05/2016 CLINICAL DATA:  Patient with shortness of breath. EXAM: PORTABLE ABDOMEN - 1 VIEW COMPARISON:  Abdominal radiograph 05/31/2016. FINDINGS: Enteric tube tip and side-port project over the left upper quadrant. Re- demonstrated gaseous distended loops of small bowel within the central abdomen measuring up to 5.5 cm. Gas is demonstrated within the colon. Supine evaluation limited for the detection of free intraperitoneal air. Lumbar spine degenerative changes. IMPRESSION: Re- demonstrated distended loops of small bowel within the central abdomen concerning for obstruction, potentially partial given colonic gas. Electronically Signed   By:  Lovey Newcomer M.D.   On: 06/05/2016 14:30   Ir Fluoro Guide Port Insertion Right  Result Date: 05/24/2016 CLINICAL DATA:  PERITONEAL CARCINOMA WITH ASCITES, ACCESS FOR CHEMO EXAM: RIGHT INTERNAL JUGULAR SINGLE LUMEN POWER PORT CATHETER INSERTION Date:  12/20/201712/20/2017 2:46 pm Radiologist:  M. Daryll Brod, MD Guidance:  Ultrasound and fluoroscopic MEDICATIONS: 2 g Ancef; The antibiotic was administered within an appropriate time interval prior to skin puncture. ANESTHESIA/SEDATION: Versed 2.0 mg IV; Fentanyl 100 mcg IV; Moderate Sedation Time:  28 minutes The patient was continuously monitored during the procedure by the interventional radiology nurse under my direct supervision. FLUOROSCOPY TIME:  , 30 seconds (4 mGy) COMPLICATIONS: None immediate. CONTRAST:  None. PROCEDURE: Informed consent was obtained from the patient following explanation of the procedure, risks, benefits and alternatives. The patient understands, agrees and consents for the procedure. All questions were addressed. A time out was performed. Maximal barrier sterile technique utilized including caps, mask, sterile gowns, sterile gloves, large sterile drape, hand hygiene, and 2% chlorhexidine scrub. Under sterile conditions and local anesthesia, right internal jugular micropuncture venous access was performed. Access was performed with ultrasound. Images were obtained for documentation. A guide wire was inserted followed by a transitional  dilator. This allowed insertion of a guide wire and catheter into the IVC. Measurements were obtained from the SVC / RA junction back to the right IJ venotomy site. In the right infraclavicular chest, a subcutaneous pocket was created over the second anterior rib. This was done under sterile conditions and local anesthesia. 1% lidocaine with epinephrine was utilized for this. A 2.5 cm incision was made in the skin. Blunt dissection was performed to create a subcutaneous pocket over the right pectoralis  major muscle. The pocket was flushed with saline vigorously. There was adequate hemostasis. The port catheter was assembled and checked for leakage. The port catheter was secured in the pocket with two retention sutures. The tubing was tunneled subcutaneously to the right venotomy site and inserted into the SVC/RA junction through a valved peel-away sheath. Position was confirmed with fluoroscopy. Images were obtained for documentation. The patient tolerated the procedure well. No immediate complications. Incisions were closed in a two layer fashion with 4 - 0 Vicryl suture. Dermabond was applied to the skin. The port catheter was accessed, blood was aspirated followed by saline and heparin flushes. Needle was removed. A dry sterile dressing was applied. IMPRESSION: Ultrasound and fluoroscopically guided right internal jugular single lumen power port catheter insertion. Tip in the SVC/RA junction. Catheter ready for use. Electronically Signed   By: Jerilynn Mages.  Shick M.D.   On: 05/24/2016 14:59    Labs:  CBC:  Recent Labs  06/01/16 0416 06/05/16 0443 06/06/16 0411 06/07/16 0529  WBC 10.6* 8.2 10.1 9.7  HGB 11.8* 10.0* 9.7* 9.2*  HCT 35.8* 30.1* 29.3* 27.4*  PLT 411* 474* 455* 448*    COAGS:  Recent Labs  05/21/16 0351  INR 0.96    BMP:  Recent Labs  06/04/16 0727 06/05/16 0443 06/06/16 0411 06/07/16 0529  NA 131* 131* 132* 131*  K 4.5 4.3 4.6 4.3  CL 100* 99* 101 97*  CO2 23 25 25 27   GLUCOSE 123* 144* 136* 162*  BUN 19 22* 22* 16  CALCIUM 8.0* 8.0* 8.0* 7.8*  CREATININE 0.34* 0.63 0.68 0.55  GFRNONAA >60 >60 >60 >60  GFRAA >60 >60 >60 >60    LIVER FUNCTION TESTS:  Recent Labs  06/02/16 0435 06/03/16 0532 06/04/16 0727 06/05/16 0443  BILITOT 0.4 0.4 0.5 0.3  AST 13* 12* 14* 14*  ALT 8* 7* 8* 9*  ALKPHOS 64 73 78 85  PROT 5.2* 5.5* 5.2* 5.0*  ALBUMIN 2.4* 2.3* 2.2* 2.2*    TUMOR MARKERS: No results for input(s): AFPTM, CEA, CA199, CHROMGRNA in the last 8760  hours.  Assessment and Plan: Recurrent ascites and abdominal distention Patient with history of incurable metastatic ovarian cancer presented to [facility] with abdominal pain and distention.  She was found to have a SBO and has been treated conservatively with NGT for decompression.  Since admission, patient has also required paracentesis x2 for recurrent ascites.  IR was consulted for gastric tube placement vs. Pleurx catheter placement for management of ascites and distention.  After discussion and review of case by Dr. Markus Daft, a Pleurx catheter would better control ascites. Risks and benefits discussed with the patient including, but not limited to bleeding, infection, peritonitis, or damage to adjacent structures. All of the patient's questions were answered, patient is agreeable to proceed with Pleurx catheter placement. Consent signed and in chart. Anticipate procedure tomorrow (06/09/15).  Orders in place to hold lovenox overnight.  If patient's symptoms not effectively controlled with resolution of SBO and Pleurx catheter alone  she will likely need a repeat CT scan for evaluation of her anatomy and disease prior to gastric tube placement.   Thank you for this interesting consult.  I greatly enjoyed meeting Saleah Angers and look forward to participating in their care.  A copy of this report was sent to the requesting provider on this date.  Electronically Signed: Docia Barrier 06/07/2016, 3:29 PM   I spent a total of    25 Minutes in face to face in clinical consultation, greater than 50% of which was counseling/coordinating care for recurrent ascites, abdominal distention.

## 2016-06-07 NOTE — Progress Notes (Signed)
PROGRESS NOTE    Audrey Garza  [id number] DOB: [birth year] DOA: 05/20/2016 PCP: No primary care provider on file.  Brief Narrative:  76 year old female with a history of metastatic ovarian cancer diagnosed in January 2017 (s/p primary debulking surgery followed by 8 adjuvant cycles of carboplatin and paclitaxel and 4 additional cycles of carboplatin, paclitaxel, avastin), last chemo Sept 2017, now presented with progressive abdominal bloating and pain for 3 weeks duration. She was feeling well until the first week of December, 2017 when she was sick with a virus from which she never recovered. She states that she began feeling distended in the abdomen and uncomfortable and was taken to the ED on 05/20/16.  CT chest/abd/pelvis9-21-17 reportedly with no evidence of malignancy, with some bullous emphysema, tortuous sigmoid colon with diverticulosis. . Recommendation was for maintenance avastin x 1 year, however patient then came [facility] in 02-2016. She has not established care with any physician since coming to [place]. CT AP 05-20-16 showed large volume ascites, extensive peritoneal disease with omental thickening, trace bilateral pleural effusions, left colonic diverticulosis, bilateral inguinal hernias containing fat, post hysterectomy, no adnexal masses, no adenopathy. US paracentesis 05-22-16 revealed 2.9 liters of yellow fluid. 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 Unfortunately, after paracentesis she began having increasing abdominal pain. Abdominal x-ray revealed distended small bowel loops concerning for small bowel obstruction. Gen. surgery was consulted. NG was inserted to suction. She is s/p repeat Paracentesis 06-06-16 which removed 4 liters.   Currently she is NPO, on NG tube suction, hoping for resolution of SBO.    Assessment & Plan:   Active Problems:   Malignant ascites   Malnutrition of moderate degree   Malignant neoplasm of ovary (HCC)   SBO  (small bowel obstruction)   Gastroesophageal reflux disease   N&V (nausea and vomiting)   Impaired nasal gastric feeding tube   Ileus (HCC)   Abdominal distention   Encounter for nasogastric (NG) tube placement  Metastatic ovarian cancer with extensive peritoneal disease with omental thickening with large ascites - appreciate Drs. Livesay and Rossi's recommendations. Dr. Nancy Marus of [place].  - Progressive within 4 months of last systemic treatment, she has been taxol + avastin 02-02-16 - rapidly symptomatic with malignant ascites and peritoneal carcinomatosis.  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 Denman George, pt is not a candidate for interval debulking surgery due to the ? platinum resistant nature of her disease; WILL HAVE Dr. Alycia Rossetti re-evaluate per Dr. Mariana Kaufman recommendatons - if pt fails advancement of diet, she should be considered for palliative G tube (discussed with surgery and they signed off). Consulted IR for G tube placement and recommend Ascites to be controlled/resolved prior to Gastrostomy tube placement. Trial of NGT Clamping tonight - unclear patient's prognosis - symptomatic management with pain control  - palliative care team consulted for assistance - Started on Octreotide by Dr. Domingo Cocking and agree -Dr. Alycia Rossetti consulted and recommend Pleruyx Cathether drain and discussed with Vascular Radiology will likely place catheter tomorrow to see if that helps symptoms prior  Fever - noted 06/06/2016; None today - Had HR in 100's, pt meets SIRS criteria - will ask for UA Negative, urine and blood cultures pending,  lactic acid and procalcitonin level - CT of Chest showed  Small left greater than right pleural effusions, increased compared to prior CT. Interim development of partial consolidations within the bilateral lower lobes which may reflect atelectasis or pneumonia. Few scattered foci of ground-glass density within the right upper and left upper lobes, suspect small foci of inflammation or infection.  Moderate to large volume of ascites in the upper abdomen with extensive mesenteric and omental metastatic disease partially visualized.  Dilated gallbladder. - ABX will be given pending results above   Small Bowel Obstruction - 05/23/2016 abdominal x-ray multiple dilated loops of bowel concerning for small bowel obstruction - Likely due to the extensive peritoneal carcinomatosis - recurrent SBO, if re occurs, she might need G tube.  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 Denman George and Dr. Cranston Neighbor Surgery  Oncology Dr. Marko Plume  Palliative Care Medicine  Interventional Radiology   Procedures: CT abdomen and pelvis 05/20/2016  Abdominal x-rays 05/23/2016, 05/24/2016, 05/25/2016, 05/27/2016, 05/28/2016, 05/30/2016, 05/28/2016, 05/29/2016  Right IJ single-lumen power port catheter insertion per  Dr. Annamaria Boots 05/24/2016  2-D echo 05/24/2016  Ultrasound-guided paracentesis - 2.9 L of clear yellow fluid removed--Dr. Earleen Newport 05/22/2016 cytology positive for adenocarcinoma  Ultrasound-guided paracentesis - 4.0 L of clear yellow fluid removed 06/06/2016  CT Abd/Pelvis 06/06/16   Antimicrobials: IV Ancef 05/24/16 x 1 dose   Diflucan 12/31 -->   IV Cefazolin Tomorrow   Subjective: Seen and examined at bedside and daughter translated as they refused to have outside translator. Patient states her mouth was hurting and NG was bothering her. No nausea. Had Abdominal Pain. No CP or lower extremity swelling.   Objective: Vitals:   06/06/16 1440 06/06/16 1902 06/06/16 2045 06/07/16 0651  BP: 120/62 107/67 135/64 137/70  Pulse: (!) 108 (!) 102 95 98  Resp: 18 18 18 18   Temp: (!) 101 F (38.3 C) 99.2 F (37.3 C) 99.5 F (37.5 C) 99.5 F (37.5 C)  TempSrc: Oral Oral Oral Oral  SpO2: 94% 98% 98% 97%  Weight:      Height:        Intake/Output Summary (Last 24 hours) at 06/07/16 0805 Last data filed at 06/07/16 0600  Gross per 24 hour  Intake          1619.33 ml  Output              150 ml  Net          1469.33 ml   Filed Weights   05/20/16 1332 05/20/16 1818 06/05/16 0621  Weight: 78 kg (171 lb 15.3 oz) 80.2 kg (176 lb 12.9 oz) 87.8 kg (193 lb 9 oz)    Examination: Physical Exam:  Constitutional: WN/WD, NAD and appears uncomfortable Eyes:  Lids and conjunctivae normal, sclerae anicteric  ENMT: External Ears, Nose appear normal. Grossly normal hearing.  Neck: Appears normal, supple, no cervical masses, normal ROM, no appreciable thyromegaly Respiratory: Clear to auscultation bilaterally, no wheezing, rales, rhonchi or crackles. Normal respiratory effort and patient is not tachypenic. No accessory muscle use.  Cardiovascular: RRR, no murmurs / rubs / gallops. S1 and S2 auscultated. No extremity edema. 2+ pedal pulses. No carotid bruits.  Abdomen: Soft, distended with palpable  ascites. No masses palpated. No appreciable hepatosplenomegaly. Bowel sounds positive x4 GU: Deferred. Musculoskeletal: No clubbing / cyanosis of digits/nails. No joint deformity upper and lower extremities.  Skin: No rashes, lesions, ulcers. No induration; Warm and dry.  Neurologic: CN 2-12 grossly intact with no focal deficits. Sensation intact in all 4 Extremities, DTR normal.  Psychiatric: Normal judgment and insight. Normal mood and appropriate affect.   Data Reviewed: I have personally reviewed following labs and imaging studies  CBC:  Recent Labs Lab 05/31/16 1741 06/01/16 0416 06/05/16 0443 06/06/16 0411 06/07/16 0529  WBC 10.0 10.6* 8.2 10.1 9.7  NEUTROABS  --  7.5 6.3  --   --   HGB 12.0 11.8* 10.0* 9.7* 9.2*  HCT 36.0 35.8* 30.1* 29.3* 27.4*  MCV 87.8 88.2 86.5 86.7 85.9  PLT 437* 411* 474* 455* [id number]*   Basic Metabolic Panel:  Recent Labs Lab 06/01/16 0416 06/02/16 0435 06/03/16 0532 06/04/16 0727 06/05/16 0443 06/06/16 0411 06/07/16 0529  NA 137 132* 132* 131* 131* 132* 131*  K 3.8 3.7 4.4 4.5 4.3 4.6 4.3  CL 105 102 100* 100* 99* 101 97*  CO2 26 25 26 23 25 25 27   GLUCOSE 136* 170* 163* 123* 144* 136* 162*  BUN 8 14 16 19  22* 22* 16  CREATININE 0.56 0.52 0.55 0.34* 0.63 0.68 0.55  CALCIUM 7.9* 7.9* 8.1* 8.0* 8.0* 8.0* 7.8*  MG 1.9 1.8 1.7 1.8 1.8  --   --   PHOS 3.5 3.2 3.2 3.4 3.7  --   --    GFR: Estimated Creatinine Clearance: 63.9 mL/min (by C-G formula based on SCr of 0.55 mg/dL). Liver Function Tests:  Recent Labs Lab 06/01/16 0416 06/02/16 0435 06/03/16 0532 06/04/16 0727 06/05/16 0443  AST 12* 13* 12* 14* 14*  ALT 10* 8* 7* 8* 9*  ALKPHOS 61 64 73 78 85  BILITOT 0.6 0.4 0.4 0.5 0.3  PROT 5.2* 5.2* 5.5* 5.2* 5.0*  ALBUMIN 2.3* 2.4* 2.3* 2.2* 2.2*   No results for input(s): LIPASE, AMYLASE in the last 168 hours. No results for input(s): AMMONIA in the last 168 hours. Coagulation Profile: No results for input(s): INR, PROTIME in the  last 168 hours. Cardiac Enzymes: No results for input(s): CKTOTAL, CKMB, CKMBINDEX, TROPONINI in the last 168 hours. BNP (last 3 results) No results for input(s): PROBNP in the last 8760 hours. HbA1C: No results for input(s): HGBA1C in the last 72 hours. CBG:  Recent Labs Lab 06/06/16 0818 06/06/16 1204 06/06/16 1834 06/07/16 0028 06/07/16 0648  GLUCAP 127* 124* 118* 119* 137*   Lipid Profile:  Recent Labs  06/05/16 0443  TRIG 76   Thyroid Function Tests: No results for input(s): TSH, T4TOTAL, FREET4, T3FREE, THYROIDAB in the last 72 hours. Anemia Panel: No results for input(s): VITAMINB12, FOLATE, FERRITIN, TIBC, IRON, RETICCTPCT in the last 72 hours. Sepsis Labs:  Recent Labs Lab 06/06/16 1726 06/06/16 2007  PROCALCITON 0.16  --   LATICACIDVEN 1.1 1.2    No results found for this or any  previous visit (from the past 240 hour(s)).   Radiology Studies: Ct Chest Wo Contrast  Result Date: 06/06/2016 CLINICAL DATA:  History of metastatic ovarian cancer presents with progressive abdominal bloating and pain for 3 weeks. Fever and dyspnea EXAM: CT CHEST WITHOUT CONTRAST TECHNIQUE: Multidetector CT imaging of the chest was performed following the standard protocol without IV contrast. COMPARISON:  Chest x-ray 06/05/2016, CT abdomen pelvis 05/20/2016 FINDINGS: Cardiovascular: Limited evaluation without the presence of intravenous contrast. Atherosclerotic calcifications of the aorta. Right-sided central venous catheter is present, the tip terminates in the distal SVC. There are coronary artery calcifications. Heart size nonenlarged. No significant pericardial effusion. Mediastinum/Nodes: Imaged thyroid gland within normal limits. Trachea is midline. Esophageal tube is present and there is a moderate hiatal hernia, the tip terminates in the proximal stomach. Limited assessment for hilar adenopathy without contrast. Subcentimeter nonspecific mediastinal lymph nodes. Lungs/Pleura:  Small bilateral pleural effusions, increased compared to prior CT, larger on the left side. Interim development of partial consolidation within the bilateral lower lobes. Few mild foci of ground-glass density within the apical portion of the right upper lobe and the subpleural anterior left upper lobe which may reflect small foci of infection or inflammation. No pneumothorax. Upper Abdomen: Moderate volume ascites in the upper abdomen. Extensive mesenteric and omental nodularity as noted on prior CT and consistent with metastatic disease. Gallbladder appears dilated. Musculoskeletal: No suspicious bone lesions. IMPRESSION: 1. Small left greater than right pleural effusions, increased compared to prior CT. Interim development of partial consolidations within the bilateral lower lobes which may reflect atelectasis or pneumonia. Few scattered foci of ground-glass density within the right upper and left upper lobes, suspect small foci of inflammation or infection. 2. Moderate to large volume of ascites in the upper abdomen with extensive mesenteric and omental metastatic disease partially visualized. 3. Dilated gallbladder. Electronically Signed   By: Donavan Foil M.D.   On: 06/06/2016 20:07   US Paracentesis  Result Date: 06/06/2016 INDICATION: Patient with a history of metastatic ovarian cancer now with recurrent abdominal ascites. Request is made for therapeutic paracentesis. EXAM: ULTRASOUND GUIDED THERAPEUTIC PARACENTESIS MEDICATIONS: 1% lidocaine COMPLICATIONS: None immediate. PROCEDURE: Informed written consent was obtained from the patient after a discussion of the risks, benefits and alternatives to treatment. A timeout was performed prior to the initiation of the procedure. Initial ultrasound scanning demonstrates a moderate amount of ascites within the right lower abdominal quadrant. The right lower abdomen was prepped and draped in the usual sterile fashion. 1% lidocaine was used for local anesthesia.  Following this, a 19 gauge, 7-cm, Yueh catheter was introduced. An ultrasound image was saved for documentation purposes. The paracentesis was performed. The catheter was removed and a dressing was applied. The patient tolerated the procedure well without immediate post procedural complication. FINDINGS: A total of approximately 4 L of yellow fluid was removed. IMPRESSION: Successful ultrasound-guided paracentesis yielding 4 liters of peritoneal fluid. Read by: Saverio Danker, PA-C Electronically Signed   By: Sandi Mariscal M.D.   On: 06/06/2016 11:31   Dg Chest Port 1v Same Day  Result Date: 06/05/2016 CLINICAL DATA:  Shortness of Breath EXAM: PORTABLE CHEST 1 VIEW COMPARISON:  None. FINDINGS: Right Port-A-Cath tip is in the lower SVC. NG tube enters the stomach. Heart is normal size. Low lung volumes. Right perihilar and left base atelectasis. No effusions. No acute bony abnormality. IMPRESSION: Areas of atelectasis bilaterally.  Low lung volumes. Electronically Signed   By: Rolm Baptise M.D.   On: 06/05/2016 14:27  Dg Abd Portable 1v  Result Date: 06/05/2016 CLINICAL DATA:  Patient with shortness of breath. EXAM: PORTABLE ABDOMEN - 1 VIEW COMPARISON:  Abdominal radiograph 05/31/2016. FINDINGS: Enteric tube tip and side-port project over the left upper quadrant. Re- demonstrated gaseous distended loops of small bowel within the central abdomen measuring up to 5.5 cm. Gas is demonstrated within the colon. Supine evaluation limited for the detection of free intraperitoneal air. Lumbar spine degenerative changes. IMPRESSION: Re- demonstrated distended loops of small bowel within the central abdomen concerning for obstruction, potentially partial given colonic gas. Electronically Signed   By: Lovey Newcomer M.D.   On: 06/05/2016 14:30   Scheduled Meds: . enoxaparin (LOVENOX) injection  40 mg Subcutaneous Q24H  . feeding supplement  1 Container Oral QID  . fluconazole (DIFLUCAN) IV  100 mg Intravenous Q24H  .  insulin aspart  0-9 Units Subcutaneous Q6H  . pantoprazole (PROTONIX) IV  40 mg Intravenous Q24H   Continuous Infusions: . sodium chloride 10 mL/hr at 06/06/16 1430  . Marland KitchenTPN (CLINIMIX-E) Adult 70 mL/hr at 06/06/16 1719   And  . fat emulsion 240 mL (06/06/16 1721)     LOS: 17 days    Kerney Elbe, DO [facility] Pager [phone]  If 7PM-7AM, please contact night-coverage [email or web address] Password [email or web address] 06/07/2016, 8:05 AM

## 2016-06-07 NOTE — Progress Notes (Signed)
[facility] Oncology June 07, 2016, 9:32 AM  Hospital day 19 Diflucan day 4. No antibiotics Chemotherapy: day 9 cycle 1 doxil TNA since 05-31-16  EMR reviewed:  4 liter US paracentesis 06-06-16, previously 2.9 liters on 05-22-16 with cytology confirming gyn malignancy  CT chest 06-06-16  Discussed with hospitalist physician on unit.  Subjective: Patient drowsy now from IV dilaudid used for throat pain related to NG; between dilaudid doses she is fully awake and alert. Abdomen feels much better since paracentesis 4 liters yesterday, yellow fluid; she does not need pain meds for abdomen now. She continues to pass flatus. No nausea. Secretions in throat difficult to clear with NG, will try suction at bedside. Still some cough, less SOB since paracentesis, is using incentive spirometer. Fever last pm to 101, no chills, back diaphoretic now. Did not get up to chair yesterday with procedures.   Oncologic History Patient had hysterectomy without oophorectomy in her 39s for benign indication, and been in her usual good health until July 2016, when she developed abdominal discomfort and bloating. In late 03-2015 she was found to have large pelvic mass and perihepatic ascites. CA 125 then was 359, with CEA of 3, CA 19-9 5.8 and alpha fetoprotein 2.3. She had exploratory laparotomy 04-19-15, which was not done by gyn oncologist, with bilateral ovaried an tumors, 10 cm with capsule ruptured on right , smaller on left, peritoneal carcinomatosis. Washings were done, however accompanying information does not describe extent of debulking. She had some bowel obstruction post operatively, with NG used ~ POD 3. Pathology available 05-05-15 showed cystic and solid left ovarian mass, 13 cm right ovarian mass, moderately differentiated tumor "tumor de celulas de la granulosa". 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 CT AP 122016 had ascites,  carcinomatosis and multiple peritoneal implants. CA 125 on 05-24-15 baseline for chemo was 141. She received 3 cycles of taxol + carboplatin from 05-26-15 thru 07-07-15 (not clear if avastin given those cycles), after which carboplatin was not available in [place]. She received taxol 175 mg/m2, CDDP and avastin for 3 additional cycles thru 10-07-15. Second look laparotomy was discussed, patient declined, and instead had 4 cycles avastin + taxol from 12-01-15 thru 02-02-16 (family is confirming, but we believe no CDDP after 10-07-15 and no carboplatin after 07-07-15). CT CAP 02-24-16 reportedly had no evidence of malignancy, with some bullous emphysema, tortuous sigmoid colon with diverticulosis. . Recommendation was for maintenance avastin x 1 year, however patient then came to [facility] in 02-2016. She has not established with any physicians since coming to [place]. CT AP 05-20-16 lias arge volume ascites, extensive peritoneal disease with omental thickening, trace bilateral pleural effusions, left colonic diverticulosis, bilateral inguinal hernias containing fat, post hysterectomy, no adnexal masses, no adenopathy. US paracentesis 05-22-16 for 2.9 liters of yellow fluid. Cytology adenocarcinoma withserous features, immunohistochemical stains confirm gyn. First doxil given 05-30-16, chosen due to side effect profile  NOTE  MAY STILL BE PLATINUM SENSITIVE   Objective: Vital signs in last 24 hours: Blood pressure 137/70, pulse 98, temperature 99.5 F (37.5 C), temperature source Oral, resp. rate 18, height 5\' 3"  (1.6 m), weight 193 lb 9 oz (87.8 kg), SpO2 97 %. Drowsy but rouses to voice and exam, quite strong when assisted up to sitting position for exam. PERRL, not icteric. Oral mucosa still slight thrush tongue and buccal mucosa + likely some chemo mucositis also on buccal mucosa now. Cannot tell thrush or mucositis posterior pharynx.  NG with clear tan fluid. Lungs with scattered crackles posteriorly, no wheezing now. Clear  secretions when coughed. Back diaphoretic, sheets wet. Abdomen less distended, still likely fluid, some BS RLQ. LE 1+ pedal edema no cords or tenderness. Feet warm. PAC site ok, TNA infusing without difficulty. Moves all extremities.   Intake/Output from previous day: 01/02 0701 - 01/03 0700 In: 1619.3 [P.O.:400; I.V.:1169.3; IV Piggyback:50] Out: 150 [Emesis/NG output:150] Intake/Output this shift: No intake/output data recorded.   Lab Results:  Recent Labs  06/06/16 0411 06/07/16 0529  WBC 10.1 9.7  HGB 9.7* 9.2*  HCT 29.3* 27.4*  PLT 455* 448*   BMET  Recent Labs  06/06/16 0411 06/07/16 0529  NA 132* 131*  K 4.6 4.3  CL 101 97*  CO2 25 27  GLUCOSE 136* 162*  BUN 22* 16  CREATININE 0.68 0.55  CALCIUM 8.0* 7.8*    Studies/Results: PACs images for CT chest reviewed. Ct Chest Wo Contrast  Result Date: 06/06/2016 CLINICAL DATA:  History of metastatic ovarian cancer presents with progressive abdominal bloating and pain for 3 weeks. Fever and dyspnea EXAM: CT CHEST WITHOUT CONTRAST TECHNIQUE: Multidetector CT imaging of the chest was performed following the standard protocol without IV contrast. COMPARISON:  Chest x-ray 06/05/2016, CT abdomen pelvis 05/20/2016 FINDINGS: Cardiovascular: Limited evaluation without the presence of intravenous contrast. Atherosclerotic calcifications of the aorta. Right-sided central venous catheter is present, the tip terminates in the distal SVC. There are coronary artery calcifications. Heart size nonenlarged. No significant pericardial effusion. Mediastinum/Nodes: Imaged thyroid gland within normal limits. Trachea is midline. Esophageal tube is present and there is a moderate hiatal hernia, the tip terminates in the proximal stomach. Limited assessment for hilar adenopathy without contrast. Subcentimeter nonspecific mediastinal lymph nodes. Lungs/Pleura: Small bilateral pleural effusions, increased compared to prior CT, larger on the left side.  Interim development of partial consolidation within the bilateral lower lobes. Few mild foci of ground-glass density within the apical portion of the right upper lobe and the subpleural anterior left upper lobe which may reflect small foci of infection or inflammation. No pneumothorax. Upper Abdomen: Moderate volume ascites in the upper abdomen. Extensive mesenteric and omental nodularity as noted on prior CT and consistent with metastatic disease. Gallbladder appears dilated. Musculoskeletal: No suspicious bone lesions. IMPRESSION: 1. Small left greater than right pleural effusions, increased compared to prior CT. Interim development of partial consolidations within the bilateral lower lobes which may reflect atelectasis or pneumonia. Few scattered foci of ground-glass density within the right upper and left upper lobes, suspect small foci of inflammation or infection. 2. Moderate to large volume of ascites in the upper abdomen with extensive mesenteric and omental metastatic disease partially visualized. 3. Dilated gallbladder. Electronically Signed   By: Donavan Foil M.D.   On: 06/06/2016 20:07   US Paracentesis  Result Date: 06/06/2016 INDICATION: Patient with a history of metastatic ovarian cancer now with recurrent abdominal ascites. Request is made for therapeutic paracentesis. EXAM: ULTRASOUND GUIDED THERAPEUTIC PARACENTESIS MEDICATIONS: 1% lidocaine COMPLICATIONS: None immediate. PROCEDURE: Informed written consent was obtained from the patient after a discussion of the risks, benefits and alternatives to treatment. A timeout was performed prior to the initiation of the procedure. Initial ultrasound scanning demonstrates a moderate amount of ascites within the right lower abdominal quadrant. The right lower abdomen was prepped and draped in the usual sterile fashion. 1% lidocaine was used for local anesthesia. Following this, a 19 gauge, 7-cm, Yueh catheter was introduced. An ultrasound image was saved  for  documentation purposes. The paracentesis was performed. The catheter was removed and a dressing was applied. The patient tolerated the procedure well without immediate post procedural complication. FINDINGS: A total of approximately 4 L of yellow fluid was removed. IMPRESSION: Successful ultrasound-guided paracentesis yielding 4 liters of peritoneal fluid. Read by: Saverio Danker, PA-C Electronically Signed   By: Sandi Mariscal M.D.   On: 06/06/2016 11:31   Dg Chest Port 1v Same Day  Result Date: 06/05/2016 CLINICAL DATA:  Shortness of Breath EXAM: PORTABLE CHEST 1 VIEW COMPARISON:  None. FINDINGS: Right Port-A-Cath tip is in the lower SVC. NG tube enters the stomach. Heart is normal size. Low lung volumes. Right perihilar and left base atelectasis. No effusions. No acute bony abnormality. IMPRESSION: Areas of atelectasis bilaterally.  Low lung volumes. Electronically Signed   By: Rolm Baptise M.D.   On: 06/05/2016 14:27   Dg Abd Portable 1v  Result Date: 06/05/2016 CLINICAL DATA:  Patient with shortness of breath. EXAM: PORTABLE ABDOMEN - 1 VIEW COMPARISON:  Abdominal radiograph 05/31/2016. FINDINGS: Enteric tube tip and side-port project over the left upper quadrant. Re- demonstrated gaseous distended loops of small bowel within the central abdomen measuring up to 5.5 cm. Gas is demonstrated within the colon. Supine evaluation limited for the detection of free intraperitoneal air. Lumbar spine degenerative changes. IMPRESSION: Re- demonstrated distended loops of small bowel within the central abdomen concerning for obstruction, potentially partial given colonic gas. Electronically Signed   By: Lovey Newcomer M.D.   On: 06/05/2016 14:30   DISCUSSION NG most bothersome now. Not complete SBO, tho did not tolerate NG out >24 hours the ONE TIME removal attempted ~ 12-24.  Last abd Xray 06-05-16 still distended small bowel tho also colonic gas. Consider clamping NG again, if does not tolerate then likely needs G  tube. TNA continues, trying to let this situation improve if possible with rest + recent chemo, as well as supporting thru recent chemo.  She may have doxil related mucositis mouth and throat, in addition to discomfort from NG. On diflucan, still some thrush in mouth + more mucositis today. IV dilaudid helpful. Did not tolerate oral viscous lidocaine. Try carafate as liquid now (tablet dissolved in water, as liquid has sorbital in base)    Gyn oncology review of situation requested , those physicians in [place] today and 1-5 if availability . Not curable situation with recurrent ovarian cancer, but if responds to systemic treatment may have disease control for some time longer. Patient was in excellent condition prior to rapid onset of symptoms over 1-2 weeks prior to admission, but has clearly declined in hospital.  Bowel obstruction is ongoing most difficult problem. Initial gyn onc consultation, when thought to be platinum resistant, did not feel that surgery for bowel obstruction then was best course, suggested bowel rest and TNA after chemo. Note review of records from [place] with translator history as follows:   3 cycles of taxol + carboplatin from 05-26-15 thru 07-07-15 (not clear if avastin given those cycles), after which carboplatin was not available in [place]. She received taxol 175 mg/m2, CDDP and avastin for 3 additional cycles thru 10-07-15. Second look laparotomy was discussed, instead had 4 cycles avastin + taxol from 12-01-15 thru 02-02-16; seems no CDDP after 10-07-15 and no carboplatin after 07-07-15. Scans in [place] early Sept NED per report. Clinically no improvement yet from first doxil given 05-30-16. May not get improvement quickly.   Palliative Care to meet with patient and daughters this afternoon; I  have told daughter that Palliative Care often can be of help with symptom management and focusing on patient's comfort and goals in relation to treatment  options   Assessment/Plan: 1.advanced ovarian carcinoma: cytology of asciteshigh grade serous, IIIC. Progressive within 4 months of taxol + avastin (last 02-02-16); appears thatlast CDDP was 10-2015 andlast carboplatin was 07-07-15, so may not be technically platinum resistant.. Rapidly symptomatic with malignant ascites and peritoneal carcinomatosis in 2 weeks PTA.  Paracentesis 2.9 liters malignant ascites on 05-22-16 and 4 liters 06-06-16..  First doxil given 05-30-16 thoany improvement in carcinomatosis likely not rapid. Hemoglobin dropping but other counts still ok. May have oral and esophageal mucositis from doxil Could get improvement/ some period of control of diseasefrom more chemotherapy; she wasin very good condition otherwise at time of recurrence, tho has declined in hospital. Short term TNA begun 05-31-16. Bowel obstruction main limiting complication. Gyn oncology review will be appreciated. 2. Small bowel obstruction: partial, reoccurred within 24 hours when NG removed 12-24. Has NOT tried off NG since that one time.Still passing some flatus. Concern from general surgeons that there may be multiple areas of obstruction with extensive carcinomatosis. She is having significant discomfort in throat from NG; G tube may be better tolerated for palliation than NG if obstruction does not improve.  3. New cough and some wheezing, fever to 101 last pm. CT chest 06-06-16 noted. Encouraged incentive spirometer, easier since paracentesis  4.marked diverticulosis by CT, without evidence diverticulitis then.  5.inguinal hernias containing only fat 6.aortic atherosclerosis by CT 7.social situation: recently moved from [place] to be with daughters in [place], family very supportive. Patient speaks Spanish. No insurance tho may be medicaid eligible.  8. Nutrition: poor po intake beginning 2-3 days prior to admission, essentially nothing po in hospital,  short term TNA begun 12-27 to see if improvement  from chemo or if surgery of some type appropriate. Daughter aware that TNA would not be appropriate for long term.  9.PAC placed by IR 10. Echocardiogram with good EF 05-25-16, for doxil 11. flu vaccine done 05-28-16 12.oral candida: IV diflucan started 06-04-16. May also be mucositis mouth and throat from doxil.   Please call or page if I can help between my rounds. Pager [phone] ,cell [place]   Evlyn Clines  MD

## 2016-06-08 ENCOUNTER — Inpatient Hospital Stay (HOSPITAL_COMMUNITY): Payer: Medicaid Other

## 2016-06-08 ENCOUNTER — Encounter (HOSPITAL_COMMUNITY): Payer: Self-pay | Admitting: Interventional Radiology

## 2016-06-08 DIAGNOSIS — C801 Malignant (primary) neoplasm, unspecified: Secondary | ICD-10-CM

## 2016-06-08 DIAGNOSIS — C786 Secondary malignant neoplasm of retroperitoneum and peritoneum: Secondary | ICD-10-CM

## 2016-06-08 DIAGNOSIS — K209 Esophagitis, unspecified without bleeding: Secondary | ICD-10-CM

## 2016-06-08 HISTORY — PX: IR GENERIC HISTORICAL: IMG1180011

## 2016-06-08 LAB — CBC WITH DIFFERENTIAL/PLATELET
Basophils Absolute: 0 10*3/uL (ref 0.0–0.1)
Basophils Relative: 0 %
Eosinophils Absolute: 0.1 10*3/uL (ref 0.0–0.7)
Eosinophils Relative: 1 %
HEMATOCRIT: 28.6 % — AB (ref 36.0–46.0)
HEMOGLOBIN: 9.6 g/dL — AB (ref 12.0–15.0)
LYMPHS ABS: 1 10*3/uL (ref 0.7–4.0)
LYMPHS PCT: 13 %
MCH: 28.9 pg (ref 26.0–34.0)
MCHC: 33.6 g/dL (ref 30.0–36.0)
MCV: 86.1 fL (ref 78.0–100.0)
MONO ABS: 0.5 10*3/uL (ref 0.1–1.0)
MONOS PCT: 6 %
NEUTROS ABS: 6 10*3/uL (ref 1.7–7.7)
NEUTROS PCT: 80 %
Platelets: 483 10*3/uL — ABNORMAL HIGH (ref 150–400)
RBC: 3.32 MIL/uL — ABNORMAL LOW (ref 3.87–5.11)
RDW: 12.7 % (ref 11.5–15.5)
WBC: 7.6 10*3/uL (ref 4.0–10.5)

## 2016-06-08 LAB — COMPREHENSIVE METABOLIC PANEL
ALBUMIN: 2 g/dL — AB (ref 3.5–5.0)
ALK PHOS: 111 U/L (ref 38–126)
ALT: 13 U/L — AB (ref 14–54)
AST: 16 U/L (ref 15–41)
Anion gap: 6 (ref 5–15)
BILIRUBIN TOTAL: 0.3 mg/dL (ref 0.3–1.2)
BUN: 14 mg/dL (ref 6–20)
CALCIUM: 8.1 mg/dL — AB (ref 8.9–10.3)
CO2: 27 mmol/L (ref 22–32)
CREATININE: 0.55 mg/dL (ref 0.44–1.00)
Chloride: 98 mmol/L — ABNORMAL LOW (ref 101–111)
GFR calc Af Amer: >60 mL/min (ref >60)
GLUCOSE: 167 mg/dL — AB (ref 65–99)
POTASSIUM: 4.6 mmol/L (ref 3.5–5.1)
Sodium: 131 mmol/L — ABNORMAL LOW (ref 135–145)
TOTAL PROTEIN: 5.3 g/dL — AB (ref 6.5–8.1)

## 2016-06-08 LAB — PROTIME-INR
INR: 1.02
Prothrombin Time: 13.4 seconds (ref 11.4–15.2)

## 2016-06-08 LAB — URINE CULTURE

## 2016-06-08 LAB — MAGNESIUM: MAGNESIUM: 1.7 mg/dL (ref 1.7–2.4)

## 2016-06-08 LAB — PROCALCITONIN: Procalcitonin: 0.13 ng/mL

## 2016-06-08 LAB — PHOSPHORUS: Phosphorus: 3.6 mg/dL (ref 2.5–4.6)

## 2016-06-08 MED ORDER — LIDOCAINE-EPINEPHRINE (PF) 2 %-1:200000 IJ SOLN
INTRAMUSCULAR | Status: AC
  Filled 2016-06-08: qty 20

## 2016-06-08 MED ORDER — TRACE MINERALS CR-CU-MN-SE-ZN 10-1000-500-60 MCG/ML IV SOLN
INTRAVENOUS | Status: AC
  Administered 2016-06-08: 17:00:00 via INTRAVENOUS
  Filled 2016-06-08: qty 1680

## 2016-06-08 MED ORDER — MIDAZOLAM HCL 2 MG/2ML IJ SOLN
INTRAMUSCULAR | Status: AC | PRN
  Administered 2016-06-08: 1 mg via INTRAVENOUS

## 2016-06-08 MED ORDER — FENTANYL CITRATE (PF) 100 MCG/2ML IJ SOLN
INTRAMUSCULAR | Status: AC
  Filled 2016-06-08: qty 4

## 2016-06-08 MED ORDER — ENOXAPARIN SODIUM 40 MG/0.4ML ~~LOC~~ SOLN
40.0000 mg | SUBCUTANEOUS | Status: DC
  Administered 2016-06-09 – 2016-06-13 (×5): 40 mg via SUBCUTANEOUS
  Filled 2016-06-08 (×5): qty 0.4

## 2016-06-08 MED ORDER — FAT EMULSION 20 % IV EMUL
240.0000 mL | INTRAVENOUS | Status: AC
  Administered 2016-06-08: 240 mL via INTRAVENOUS
  Filled 2016-06-08: qty 250

## 2016-06-08 MED ORDER — MIDAZOLAM HCL 2 MG/2ML IJ SOLN
INTRAMUSCULAR | Status: AC
  Filled 2016-06-08: qty 6

## 2016-06-08 MED ORDER — FENTANYL CITRATE (PF) 100 MCG/2ML IJ SOLN
INTRAMUSCULAR | Status: AC | PRN
  Administered 2016-06-08 (×2): 25 ug via INTRAVENOUS

## 2016-06-08 NOTE — Progress Notes (Signed)
PHARMACY - ADULT TOTAL PARENTERAL NUTRITION CONSULT NOTE   Pharmacy Consult for TPN Indication: partial small bowel obstruction  Patient Measurements: Height: 5\' 3"  (160 cm) Weight: 193 lb 9 oz (87.8 kg) IBW/kg (Calculated) : 52.4 TPN AdjBW (KG): 58.8 Body mass index is 34.29 kg/m.  Insulin Requirements: 0 units Novolog in the past 24hr  Current Nutrition: NPO; TPN at goal rate  IVF: NS at 10 ml/hr KVO  Central access: implanted port TPN start date: 12/27  ASSESSMENT                                                                                                          HPI: 52 yoF with h/o metastatic ovarian cancer with extensive peritoneal disease with omental thickening with large malignant ascites and partial SBO likely 2/2 metastatic disease. Per MD note, has been 12 days since she has been able to take anything PO. She has NGT to LIWS and has failed multiple attempts to d/c NG and advance diet.  TPN to start 12/27.  Significant events:  12/26 D1C1 Doxil 40mg /m2 - tolerated well 12/27 TPN started 12/28 May allow small sips of fluids 1/2: 4L paracentesis 1/3: trial of octreotide infusion 1/4: pleurx catheter placement for management of continued ascites and distention  Today, 06/08/2016:   Glucose - no h/o DM, CBGs were continually at goal 100-150 with minimal SSI use; CBG checks and SSI stopped 1/3 for patient comfort  Electrolytes - Na and Cl remain slightly low but stable  Renal - SCr WNL, CrCl 60 ml/min  I/O - positive several liters with weight increasing since admission; NG clamped overnight  LFTs - low, stable  TGs - wnl  Prealbumin - <5 (12/28)  NUTRITIONAL GOALS                                                                                             RD recs (12/26): 1750-1950 kcal, 75-85g protein, 1.7-1.9L/day fluid Clinimix E 5/20 at a goal rate of 70 ml/hr + 20% fat emulsion at 10 ml/hr to provide: 84 g/day protein, 1958 Kcal/day.  (+++There is  currently a Psychologist, prison and probation services of Clinimix solution.  Pharmacy will use Clinimix product based on what we have available in stock+++)  PLAN  At 1800 today:  Continue Clinimix E 5/20 at goal 70 ml/hr  20% fat emulsion at 10 ml/hr.  TPN to contain standard multivitamins and trace elements.  Continue IVF at [facility].  TPN lab panels on Mondays & Thursdays   Hershal Coria, PharmD, BCPS Pager: [phone] 06/08/2016 8:54 AM

## 2016-06-08 NOTE — Progress Notes (Signed)
Nutrition Follow-up  DOCUMENTATION CODES:   Non-severe (moderate) malnutrition in context of chronic illness  INTERVENTION:   TPN per Pharmacy RD to continue to monitor  NUTRITION DIAGNOSIS:   Increased nutrient needs related to cancer and cancer related treatments as evidenced by estimated needs.  Ongoing.  GOAL:   Patient will meet greater than or equal to 90% of their needs  Meeting with TPN.  MONITOR:   Labs, Weight trends, I & O's, Other (Comment) (TPN)  ASSESSMENT:   With H/o metastatic ovarian cancer s/o surgery and chemo, last chemo in 02/2016 ,she  recently moved from [place]  A few weeks ago. She started to have progressive abdominal bloating and pain in the last three weeks  Pt is s/p placement of tunneled peritoneal drainage catheter today. TPN continuing: Clinimix E 5/20 at a goal rate of 70 ml/hr + 20% fat emulsion at 10 ml/hr to provide: 84 g/day protein, 1958 Kcal/day. No changes in nutrition support plan at this time.  Per palliative care meeting 1/3: patient remains full code.   Medications reviewed. Labs reviewed: Low Na Mg/K/Phos WNL  Diet Order:  TPN (CLINIMIX-E) Adult Diet NPO time specified Except for: Sips with Meds TPN (CLINIMIX-E) Adult  Skin:  Reviewed, no issues  Last BM:  1/1  Height:   Ht Readings from Last 1 Encounters:  05/20/16 5\' 3"  (1.6 m)    Weight:   Wt Readings from Last 1 Encounters:  06/05/16 193 lb 9 oz (87.8 kg)    Ideal Body Weight:  52.3 kg  BMI:  Body mass index is 34.29 kg/m.  Estimated Nutritional Needs:   Kcal:  [id number]  Protein:  75-85g  Fluid:  1.7-1.9L/day  EDUCATION NEEDS:   No education needs identified at this time  Clayton Bibles, MS, RD, LDN Pager: [phone] After Hours Pager: [phone]

## 2016-06-08 NOTE — Procedures (Signed)
Successful placement of a tunneled peritoneal drainage catheter. Approximately 900 cc of serous pleural fluid aspirated after catheter placement. No immediate complications.  Ronny Bacon, MD Pager #: [phone]

## 2016-06-08 NOTE — Progress Notes (Signed)
Palliative care encounter  Chief complaint: Nausea and establishing goals of care.  I met today with Audrey Garza and her daughter.  She had just returned from having Pleurx placed with 900 mL removed from her abdomen.  She reports feeling much better today and believes that the medication started last evening (octreotide) and having Pleurex placed have been beneficial to her.  Audrey Garza seems in much better spirits today and reports that her throat still hurts, but overall she is feeling much better.  She has had NG tube clamped for a period of time now and is on clear liquid diet with trial planned to see if she tolerates an eventual plan to remove NG tube.  Once NG is removed, we will need to see if she is able to tolerate being off octreotide. If not, will need to consider either placement of venting G-tube as we have previously been discussing or continuation of octreotide with subcutaneous injections or long-acting Sandostatin [place].  I am concerned about her being able to get Sandostatin long-term due to the fact that she does not have insurance coverage at this time.  Her daughter had requested a letter stating medical necessity for request for humanitarian visa for her sister to come and visit. We talked again today about this including the fact that her mother has been noncurable illness and the goal of continued care is to add time and quality to her life. I provided a note outlining this for them to give with application for humanitarian visa.  Will continue to follow daily to work in collaboration with her other physicians on developing plan for management of symptoms and long term goals.  Time in: 1645 Time out: 1715 Total time: 30 minutes Greater than 50%  of this time was spent counseling and coordinating care related to the above assessment and plan.   Micheline Rough, MD Derma Team [phone]

## 2016-06-08 NOTE — Progress Notes (Signed)
PROGRESS NOTE    Audrey Garza  [id number] DOB: [birth year] DOA: 05/20/2016 PCP: No primary care provider on file.  Brief Narrative:  76 year old female with a history of metastatic ovarian cancer diagnosed in January 2017 (s/p primary debulking surgery followed by 8 adjuvant cycles of carboplatin and paclitaxel and 4 additional cycles of carboplatin, paclitaxel, avastin), last chemo Sept 2017, now presented with progressive abdominal bloating and pain for 3 weeks duration. She was feeling well until the first week of December, 2017 when she was sick with a virus from which she never recovered. She states that she began feeling distended in the abdomen and uncomfortable and was taken to the ED on 05/20/16.  CT chest/abd/pelvis9-21-17 reportedly with no evidence of malignancy, with some bullous emphysema, tortuous sigmoid colon with diverticulosis. . Recommendation was for maintenance avastin x 1 year, however patient then came to [facility] in 02-2016. She has not established care with any physician since coming to [place]. CT AP 05-20-16 showed large volume ascites, extensive peritoneal disease with omental thickening, trace bilateral pleural effusions, left colonic diverticulosis, bilateral inguinal hernias containing fat, post hysterectomy, no adnexal masses, no adenopathy. US paracentesis 05-22-16 revealed 2.9 liters of yellow fluid. 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 Unfortunately, after paracentesis she began having increasing abdominal pain. Abdominal x-ray revealed distended small bowel loops concerning for small bowel obstruction. Gen. surgery was consulted. NG was inserted to suction. She is s/p repeat Paracentesis 06-06-16 which removed 4 liters. Today she went down for Pleux of abdomen with 900 mL removed. Patient was transitioned to Clear Liquid Diet with NG Clamped.   Assessment & Plan:   Active Problems:   Malignant ascites   Malnutrition of moderate  degree   Ovarian carcinoma (HCC)   SBO (small bowel obstruction)   Gastroesophageal reflux disease   N&V (nausea and vomiting)   Impaired nasal gastric feeding tube   Ileus (HCC)   Abdominal distention   Encounter for nasogastric (NG) tube placement   Thrush of mouth and esophagus (HCC)  Metastatic ovarian cancer with extensive peritoneal disease with omental thickening and peritoneal carcinomatosis with large ascites s/p Pleurx Catheter Drain - appreciate Drs. Livesay and Rossi's recommendations. Dr. Nancy Marus of [place].  - Progressive within 4 months of last systemic treatment, she has been taxol + avastin 02-02-16 - rapidly symptomatic with malignant ascites and peritoneal carcinomatosis.  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 Denman George, pt is not a candidate for interval debulking surgery due to the ? platinum resistant nature of her disease; WILL HAVE Dr. Alycia Rossetti re-evaluate per Dr. Mariana Kaufman recommendatons - if pt fails advancement of diet, she should be considered for palliative G tube (discussed with surgery and they signed off). Consulted IR for G tube placement and recommend Ascites to be controlled/resolved prior to Gastrostomy tube placement. Trial of NGT Clamping tonight - unclear patient's prognosis - symptomatic management with pain control  - palliative care team consulted for assistance - Started on Octreotide by Dr. Domingo Cocking and agree; C/w Octreotide; Once NG is removed will need to evaluate if she can tolerate being off octreotide or consider placing venting G Tube -Dr. Alycia Rossetti consulted and recommend Plerux Cathether drain  -Pluerx  Cath in place and is s/p 900 mL of  removal  Fever - noted 06/06/2016; None today - Had HR in 100's, pt meets SIRS criteria - will ask for UA Negative, urine and blood cultures pending, lactic acid and procalcitonin level - CT of Chest showed Small left greater than right pleural effusions, increased compared to prior CT. Interim development of partial consolidations within the bilateral lower lobes which may reflect atelectasis or pneumonia. Few scattered foci of ground-glass density within the right upper and left upper lobes, suspect small foci of inflammation or infection.  Moderate to large volume of ascites in the upper abdomen with extensive mesenteric and omental metastatic disease partially visualized.  Dilated gallbladder. - ABX will be given pending results above   Small Bowel Obstruction - 05/23/2016 abdominal x-ray multiple dilated loops of bowel concerning for small bowel obstruction - Likely due to the extensive peritoneal carcinomatosis - recurrent SBO, if re occurs, she might need G tube.  - pt reports worsening abdominal pain this am, asking for more pain medications - PCT consulted for further GOC discussion  - IR consulted and Tunneled Plurex Cather in place - Started on Octreotide - NGT Clamping; Clear Liquid Diet started; Will evaluate how patient does and plan is to remove NGT  Mild Hyponatremia - Secondary to volume depletion vs. Hyponatremia from Asicites; Na+ is 131 - BMP in AM  Mild thrombocytosis - likely reactive; Platelet went from 448 -> 483  Protein Calorie Malnutrition - in the context of acute on chronic illness - nutritionist assistance appreciated   Anemia of chronic disease, malignancy - Hg overall stable  - Hb/Hct went from [phone] -> 9.2/27.4 -> 9.6/28.6  Oral Thrush - continue Diflucan   DVT prophylaxis: Lovenox 40 mg sq Code Status: FULL CODE Family Communication: Daughter present at bedside Disposition Plan: Pending Workup   Consultants:   Gyn Oncology Dr.  Denman George and Dr. Alycia Rossetti  General Surgery  Oncology Dr. Marko Plume  Palliative Care Medicine  Interventional Radiology   Procedures: CT abdomen and pelvis 05/20/2016  Abdominal x-rays 05/23/2016, 05/24/2016, 05/25/2016, 05/27/2016, 05/28/2016, 05/30/2016, 05/28/2016, 05/29/2016  Right IJ single-lumen power port catheter insertion per Dr. Annamaria Boots 05/24/2016  2-D echo 05/24/2016  Ultrasound-guided paracentesis - 2.9 L of clear yellow fluid removed--Dr. Earleen Newport 05/22/2016 cytology positive for adenocarcinoma  Ultrasound-guided paracentesis - 4.0 L of clear yellow fluid removed 06/06/2016  CT Abd/Pelvis 06/06/16  Tunneled Peritoneal Drainage Catheter in place and 900 mL of Serous Pleural Fluid removed 06/08/16   Antimicrobials: IV Ancef 05/24/16 x 1 dose   Diflucan 12/31 -->   IV Cefazolin for Pleurex Cathether  Subjective: Seen and examined at bedside and daughter translated as they refused to have outside translator again. Patient states her throat was hurting and  bothering her. No nausea. No Abdominal Pain today. No CP or lower extremity swelling. Wanted to try something to eat after catheter placement.   Objective: Vitals:   06/08/16 1320 06/08/16 1343 06/08/16 1414 06/08/16 1445  BP: (!) 146/71 132/66 (!) 145/77 (!) 142/73  Pulse: 95 90 88 91  Resp: 20 20 20 18   Temp:  98.3 F (36.8 C) 99 F (37.2 C) 98.5 F (36.9 C)  TempSrc:  Oral Oral Oral  SpO2: 96% 95% 95% 95%  Weight:      Height:        Intake/Output Summary (Last 24 hours) at 06/08/16 1824 Last data filed at 06/08/16 1800  Gross per 24 hour  Intake  1097.17 ml  Output                0 ml  Net          1097.17 ml   Filed Weights   05/20/16 1332 05/20/16 1818 06/05/16 0621  Weight: 78 kg (171 lb 15.3 oz) 80.2 kg (176 lb 12.9 oz) 87.8 kg (193 lb 9 oz)    Examination: Physical Exam:  Constitutional: WN/WD, NAD and appears uncomfortable Eyes: Lids and conjunctivae normal, sclerae anicteric  ENMT:  External Ears, Nose appear normal. Grossly normal hearing. NGT in place Neck: Appears normal, supple, no cervical masses, normal ROM, no appreciable thyromegaly Respiratory: Clear to auscultation bilaterally, no wheezing, rales, rhonchi or crackles. Normal respiratory effort and patient is not tachypenic. No accessory muscle use.  Cardiovascular: RRR, no murmurs / rubs / gallops. S1 and S2 auscultated. No extremity edema.  Abdomen: Soft, distended with palpable ascites. No masses palpated. No appreciable hepatosplenomegaly. Bowel sounds positive x4 GU: Deferred. Musculoskeletal: No clubbing / cyanosis of digits/nails. No joint deformity upper and lower extremities.  Skin: No rashes, lesions, ulcers. No induration; Warm and dry.  Neurologic: CN 2-12 grossly intact with no focal deficits. Sensation intact in all 4 Extremities, DTR normal.  Psychiatric: Normal judgment and insight. Normal mood and appropriate affect.   Data Reviewed: I have personally reviewed following labs and imaging studies  CBC:  Recent Labs Lab 06/05/16 0443 06/06/16 0411 06/07/16 0529 06/07/16 1917 06/08/16 0423  WBC 8.2 10.1 9.7 8.0 7.6  NEUTROABS 6.3  --   --   --  6.0  HGB 10.0* 9.7* 9.2* 10.2* 9.6*  HCT 30.1* 29.3* 27.4* 30.0* 28.6*  MCV 86.5 86.7 85.9 86.2 86.1  PLT 474* 455* 448* 494* [id number]*   Basic Metabolic Panel:  Recent Labs Lab 06/02/16 0435 06/03/16 0532 06/04/16 0727 06/05/16 0443 06/06/16 0411 06/07/16 0529 06/08/16 0423  NA 132* 132* 131* 131* 132* 131* 131*  K 3.7 4.4 4.5 4.3 4.6 4.3 4.6  CL 102 100* 100* 99* 101 97* 98*  CO2 25 26 23 25 25 27 27   GLUCOSE 170* 163* 123* 144* 136* 162* 167*  BUN 14 16 19  22* 22* 16 14  CREATININE 0.52 0.55 0.34* 0.63 0.68 0.55 0.55  CALCIUM 7.9* 8.1* 8.0* 8.0* 8.0* 7.8* 8.1*  MG 1.8 1.7 1.8 1.8  --   --  1.7  PHOS 3.2 3.2 3.4 3.7  --   --  3.6   GFR: Estimated Creatinine Clearance: 63.9 mL/min (by C-G formula based on SCr of 0.55 mg/dL). Liver  Function Tests:  Recent Labs Lab 06/02/16 0435 06/03/16 0532 06/04/16 0727 06/05/16 0443 06/08/16 0423  AST 13* 12* 14* 14* 16  ALT 8* 7* 8* 9* 13*  ALKPHOS 64 73 78 85 111  BILITOT 0.4 0.4 0.5 0.3 0.3  PROT 5.2* 5.5* 5.2* 5.0* 5.3*  ALBUMIN 2.4* 2.3* 2.2* 2.2* 2.0*   No results for input(s): LIPASE, AMYLASE in the last 168 hours. No results for input(s): AMMONIA in the last 168 hours. Coagulation Profile:  Recent Labs Lab 06/08/16 0423  INR 1.02   Cardiac Enzymes: No results for input(s): CKTOTAL, CKMB, CKMBINDEX, TROPONINI in the last 168 hours. BNP (last 3 results) No results for input(s): PROBNP in the last 8760 hours. HbA1C: No results for input(s): HGBA1C in the last 72 hours. CBG:  Recent Labs Lab 06/06/16 0818 06/06/16 1204 06/06/16 1834 06/07/16 0028 06/07/16 0648  GLUCAP 127* 124* 118* 119* 137*   Lipid  Profile: No results for input(s): CHOL, HDL, LDLCALC, TRIG, CHOLHDL, LDLDIRECT in the last 72 hours. Thyroid Function Tests: No results for input(s): TSH, T4TOTAL, FREET4, T3FREE, THYROIDAB in the last 72 hours. Anemia Panel: No results for input(s): VITAMINB12, FOLATE, FERRITIN, TIBC, IRON, RETICCTPCT in the last 72 hours. Sepsis Labs:  Recent Labs Lab 06/06/16 1726 06/06/16 2007 06/08/16 0423  PROCALCITON 0.16  --  0.13  LATICACIDVEN 1.1 1.2  --     Recent Results (from the past 240 hour(s))  Culture, blood (routine x 2)     Status: None (Preliminary result)   Collection Time: 06/06/16  5:09 PM  Result Value Ref Range Status   Specimen Description BLOOD RIGHT HAND  Final   Special Requests IN PEDIATRIC BOTTLE 3CC  Final   Culture   Final    NO GROWTH 2 DAYS Performed at [facility]    Report Status PENDING  Incomplete  Culture, blood (routine x 2)     Status: None (Preliminary result)   Collection Time: 06/06/16  5:20 PM  Result Value Ref Range Status   Specimen Description BLOOD LEFT ARM  Final   Special Requests BOTTLES  DRAWN AEROBIC AND ANAEROBIC [place]  Final   Culture   Final    NO GROWTH 2 DAYS Performed at [facility]    Report Status PENDING  Incomplete  Culture, Urine     Status: Abnormal   Collection Time: 06/07/16 12:32 AM  Result Value Ref Range Status   Specimen Description URINE, RANDOM  Final   Special Requests NONE  Final   Culture MULTIPLE SPECIES PRESENT, SUGGEST RECOLLECTION (A)  Final   Report Status 06/08/2016 FINAL  Final     Radiology Studies: Ir Perc Athena Masse Perit Cath Wo Port  Result Date: 06/08/2016 INDICATION: History of ovarian cancer recurrent symptomatic malignant ascites. Please perform ultrasound and fluoroscopic guided peritoneal drainage catheter placement for palliative purposes. EXAM: ULTRASOUND AND FLUOROSCOPIC GUIDED PLACEMENT OF TUNNELED PERITONEAL CATHETER COMPARISON:  Ultrasound-guided paracentesis - 06/06/2016; CT abdomen pelvis - 05/20/2016 MEDICATIONS: Ancef 2 gm IV; Antibiotics were administered within an appropriate time frame prior to skin puncture. CONTRAST:  None. ANESTHESIA/SEDATION: Moderate (conscious) sedation was employed during this procedure. A total of Versed 2 mg and Fentanyl 50 mcg was administered intravenously. Moderate Sedation Time: 18 minutes. The patient's level of consciousness and vital signs were monitored continuously by radiology nursing throughout the procedure under my direct supervision. FLUOROSCOPY TIME:  42 seconds (8 mGy) COMPLICATIONS: None immediate. TECHNIQUE: Informed written consent was obtained from the patient after a discussion of the risks, benefits and alternatives to treatment. Questions regarding the procedure were encouraged and answered. A timeout was performed prior to the initiation of the procedure. Ultrasound scanning of the right lower abdominal quadrant demonstrates a small amount of fluid within the right lower abdominal quadrant. The right lower abdomen was prepped and draped usual sterile fashion a sterile drape was  applied, covering the operative table. Maximum barrier sterile technique with sterile gowns and gloves were used for the procedure. A timeout was performed prior to the initiation of the procedure. Local anesthesia was provided with 1% lidocaine with epinephrine. Under direct ultrasound guidance, an 18 gauge trocar needle was advanced into the peritoneal space with the right abdominal quadrant. Appropriate positioning was confirmed with the efflux of ascites. An Amplatz super stiff wire was advanced across the abdomen under intermittent fluoroscopic guidance. The access site was dilated over the Amplatz wire, allowing advancement of a peel-away  sheath. The peritoneal catheter was tunneled in an antegrade fashion from a site along the right mid clavicular line to the access site and inserted through the peel-away sheath with tip ultimately terminating within midline of the lower abdomen. Several postprocedural spot abdominal radiographs were obtained. The catheter was connected to wall suction and approximately 900 cc of serous ascites was aspirated. The access site was closed with an interrupted 4-0 Vicryl suture, Dermabond and Steri-Strips. Dressings were placed. The patient tolerated procedure well without immediate postprocedural complication. FINDINGS: After successful ultrasound and fluoroscopic guided peritoneal drainage catheter placement, the right lower quadrant abdominal approach drainage catheter terminates within the midline of the lower abdomen Following tunneled peritoneal drainage catheter placement, approximately 900 cc of serous fluid was aspirated. IMPRESSION: 1. Successful ultrasound and fluoroscopic guided placement of a tunneled peritoneal drainage catheter. 2. Successful aspiration of 900 cc of serous ascites following tunneled peritoneal drainage catheter placement. Electronically Signed   By: Sandi Mariscal M.D.   On: 06/08/2016 16:47   Scheduled Meds: . [START ON 06/09/2016] enoxaparin  (LOVENOX) injection  40 mg Subcutaneous Q24H  . fluconazole (DIFLUCAN) IV  100 mg Intravenous Q24H  . lidocaine-EPINEPHrine      . pantoprazole (PROTONIX) IV  40 mg Intravenous Q24H  . sucralfate  1 g Oral TID WC & HS   Continuous Infusions: . sodium chloride 10 mL/hr at 06/06/16 1430  . Marland KitchenTPN (CLINIMIX-E) Adult 70 mL/hr at 06/08/16 1722   And  . fat emulsion 240 mL (06/08/16 1722)  . octreotide  (SANDOSTATIN)    IV infusion 15 mcg/hr (06/07/16 1628)    LOS: 18 days    Kerney Elbe, DO [facility] Pager [phone]  If 7PM-7AM, please contact night-coverage [email or web address] Password [facility] 06/08/2016, 6:24 PM

## 2016-06-08 NOTE — Progress Notes (Signed)
MEDICATION-RELATED CONSULT NOTE   IR Procedure Consult - Anticoagulant/Antiplatelet PTA/Inpatient Med List Review by Pharmacist    Procedure: placement of a tunneled peritoneal drainage catheter    Completed: 1300 1/4  Post-Procedural bleeding risk per IR MD assessment:  Standard  Antithrombotic medications on inpatient or PTA profile prior to procedure:   Lovenox 40 mg daily for VTE prophylaxis    Recommended restart time per IR Post-Procedure Guidelines:  Next AM   Plan:     Resume Lovenox 40 mg SQ q24h tomorrow morning  Hershal Coria, PharmD, BCPS Pager: [phone] 06/08/2016 1:55 PM

## 2016-06-09 ENCOUNTER — Ambulatory Visit: Payer: Self-pay | Admitting: Oncology

## 2016-06-09 ENCOUNTER — Other Ambulatory Visit: Payer: Self-pay

## 2016-06-09 DIAGNOSIS — Z515 Encounter for palliative care: Secondary | ICD-10-CM

## 2016-06-09 LAB — COMPREHENSIVE METABOLIC PANEL
ALK PHOS: 112 U/L (ref 38–126)
ALK PHOS: 131 U/L — AB (ref 38–126)
ALT: 18 U/L (ref 14–54)
ALT: 20 U/L (ref 14–54)
ANION GAP: 7 (ref 5–15)
AST: 23 U/L (ref 15–41)
AST: 22 U/L (ref 15–41)
Albumin: 2.1 g/dL — ABNORMAL LOW (ref 3.5–5.0)
Albumin: 2.2 g/dL — ABNORMAL LOW (ref 3.5–5.0)
Anion gap: 6 (ref 5–15)
BILIRUBIN TOTAL: 0.3 mg/dL (ref 0.3–1.2)
BUN: 15 mg/dL (ref 6–20)
BUN: 14 mg/dL (ref 6–20)
CALCIUM: 8.2 mg/dL — AB (ref 8.9–10.3)
CO2: 28 mmol/L (ref 22–32)
CO2: 28 mmol/L (ref 22–32)
CREATININE: 0.6 mg/dL (ref 0.44–1.00)
CREATININE: 0.5 mg/dL (ref 0.44–1.00)
Calcium: 8.2 mg/dL — ABNORMAL LOW (ref 8.9–10.3)
Chloride: 99 mmol/L — ABNORMAL LOW (ref 101–111)
Chloride: 97 mmol/L — ABNORMAL LOW (ref 101–111)
GFR calc Af Amer: >60 mL/min (ref >60)
Glucose, Bld: 147 mg/dL — ABNORMAL HIGH (ref 65–99)
Glucose, Bld: 159 mg/dL — ABNORMAL HIGH (ref 65–99)
Potassium: 4.2 mmol/L (ref 3.5–5.1)
Potassium: 4.2 mmol/L (ref 3.5–5.1)
SODIUM: 134 mmol/L — AB (ref 135–145)
Sodium: 131 mmol/L — ABNORMAL LOW (ref 135–145)
TOTAL PROTEIN: 6.1 g/dL — AB (ref 6.5–8.1)
Total Bilirubin: 0.3 mg/dL (ref 0.3–1.2)
Total Protein: 5.3 g/dL — ABNORMAL LOW (ref 6.5–8.1)

## 2016-06-09 LAB — CBC WITH DIFFERENTIAL/PLATELET
BASOS ABS: 0 10*3/uL (ref 0.0–0.1)
BASOS ABS: 0 10*3/uL (ref 0.0–0.1)
BASOS PCT: 0 %
Basophils Relative: 0 %
EOS ABS: 0.1 10*3/uL (ref 0.0–0.7)
EOS PCT: 2 %
Eosinophils Absolute: 0.2 10*3/uL (ref 0.0–0.7)
Eosinophils Relative: 2 %
HCT: 29.9 % — ABNORMAL LOW (ref 36.0–46.0)
HEMATOCRIT: 30.7 % — AB (ref 36.0–46.0)
Hemoglobin: 10 g/dL — ABNORMAL LOW (ref 12.0–15.0)
Hemoglobin: 10 g/dL — ABNORMAL LOW (ref 12.0–15.0)
LYMPHS PCT: 12 %
LYMPHS PCT: 18 %
Lymphs Abs: 0.8 10*3/uL (ref 0.7–4.0)
Lymphs Abs: 1.2 10*3/uL (ref 0.7–4.0)
MCH: 28.7 pg (ref 26.0–34.0)
MCH: 27.5 pg (ref 26.0–34.0)
MCHC: 33.4 g/dL (ref 30.0–36.0)
MCHC: 32.6 g/dL (ref 30.0–36.0)
MCV: 85.9 fL (ref 78.0–100.0)
MCV: 84.3 fL (ref 78.0–100.0)
MONO ABS: 0.4 10*3/uL (ref 0.1–1.0)
Monocytes Absolute: 0.3 10*3/uL (ref 0.1–1.0)
Monocytes Relative: 5 %
Monocytes Relative: 6 %
NEUTROS ABS: 4.9 10*3/uL (ref 1.7–7.7)
Neutro Abs: 5.7 10*3/uL (ref 1.7–7.7)
Neutrophils Relative %: 81 %
Neutrophils Relative %: 74 %
PLATELETS: 517 10*3/uL — AB (ref 150–400)
Platelets: 529 10*3/uL — ABNORMAL HIGH (ref 150–400)
RBC: 3.48 MIL/uL — AB (ref 3.87–5.11)
RBC: 3.64 MIL/uL — AB (ref 3.87–5.11)
RDW: 12.7 % (ref 11.5–15.5)
RDW: 12.4 % (ref 11.5–15.5)
WBC: 6.9 10*3/uL (ref 4.0–10.5)
WBC: 6.7 10*3/uL (ref 4.0–10.5)

## 2016-06-09 LAB — PHOSPHORUS: PHOSPHORUS: 3.4 mg/dL (ref 2.5–4.6)

## 2016-06-09 LAB — MAGNESIUM: MAGNESIUM: 1.7 mg/dL (ref 1.7–2.4)

## 2016-06-09 MED ORDER — FAT EMULSION 20 % IV EMUL
240.0000 mL | INTRAVENOUS | Status: AC
  Administered 2016-06-09: 240 mL via INTRAVENOUS
  Filled 2016-06-09: qty 250

## 2016-06-09 MED ORDER — GUAIFENESIN-DM 100-10 MG/5ML PO SYRP
5.0000 mL | ORAL_SOLUTION | ORAL | Status: DC | PRN
  Administered 2016-06-09 – 2016-06-10 (×2): 5 mL via ORAL
  Filled 2016-06-09 (×2): qty 10

## 2016-06-09 MED ORDER — TRACE MINERALS CR-CU-MN-SE-ZN 10-1000-500-60 MCG/ML IV SOLN
INTRAVENOUS | Status: AC
  Administered 2016-06-09: 17:00:00 via INTRAVENOUS
  Filled 2016-06-09: qty 1680

## 2016-06-09 NOTE — Progress Notes (Signed)
PROGRESS NOTE    Audrey Garza  [id number] DOB: 08/18/1940 DOA: 05/20/2016 PCP: No primary care provider on file.  Brief Narrative:  76 year old female with a history of metastatic ovarian cancer diagnosed in January 2017 (s/p primary debulking surgery followed by 8 adjuvant cycles of carboplatin and paclitaxel and 4 additional cycles of carboplatin, paclitaxel, avastin), last chemo Sept 2017, now presented with progressive abdominal bloating and pain for 3 weeks duration. She was feeling well until the first week of December, 2017 when she was sick with a virus from which she never recovered. She states that she began feeling distended in the abdomen and uncomfortable and was taken to the ED on 05/20/16.  CT chest/abd/pelvis9-21-17 reportedly with no evidence of malignancy, with some bullous emphysema, tortuous sigmoid colon with diverticulosis. . Recommendation was for maintenance avastin x 1 year, however patient then came to [facility] in 02-2016. She has not established care with any physician since coming to [place]. CT AP 05-20-16 showed large volume ascites, extensive peritoneal disease with omental thickening, trace bilateral pleural effusions, left colonic diverticulosis, bilateral inguinal hernias containing fat, post hysterectomy, no adnexal masses, no adenopathy. US paracentesis 05-22-16 revealed 2.9 liters of yellow fluid. 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 Unfortunately, after paracentesis she began having increasing abdominal pain. Abdominal x-ray revealed distended small bowel loops concerning for small bowel obstruction. Gen. surgery was consulted. NG was inserted to suction. She is s/p repeat Paracentesis 06-06-16 which removed 4 liters. Today she went down for Pleux of abdomen with 900 mL removed. Patient was transitioned to Clear Liquid Diet and NGT was removed today.  Assessment & Plan:   Active Problems:   Malignant ascites   Malnutrition of moderate  degree   Malignant neoplasm of ovary (HCC)   Partial small bowel obstruction   Gastroesophageal reflux disease   N&V (nausea and vomiting)   Impaired nasal gastric feeding tube   Ileus (HCC)   Abdominal distention   Encounter for nasogastric (NG) tube placement   Thrush of mouth and esophagus (HCC)   Acute esophagitis   Peritoneal carcinomatosis ([place])  Metastatic ovarian cancer with extensive peritoneal disease with omental thickening and peritoneal carcinomatosis with large ascites s/p Pleurx Catheter Drain - appreciate Drs. Livesay and Rossi's recommendations. Dr. Nancy Marus of [place].  - Progressive within 4 months of last systemic treatment, she has been taxol + avastin 02-02-16; Received Doxil during hospitalization on 05/30/16 - rapidly symptomatic with malignant ascites and peritoneal carcinomatosis.  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 Denman George, pt is not a candidate for interval debulking surgery due to the ? platinum resistant nature of her disease; Dr. Alycia Rossetti re-evaluated per Dr. Mariana Kaufman recommendatons - if pt fails advancement of diet, she should be considered for palliative G tube (discussed with surgery and they signed off). Consulted IR for G tube placement and recommend Ascites to be controlled/resolved prior to Gastrostomy tube placement.  - unclear patient's prognosis and response to recent doxil - symptomatic management with pain control  - palliative care team consulted for assistance - Started on Octreotide by Dr. Domingo Cocking and agree; C/w Octreotide; NG is removed will need to evaluate if she can tolerate being off octreotide  or consider placing venting G  Tube -Dr. Alycia Rossetti consulted and recommended Plerux Cathether drain which was placed by IR  -Pluerx Cath in place and is s/p 900 mL of removal -NGT removed and patient tolerating Clear Liquid Diet so far; IF tolerates will advance to Full Liquid diet tomorrow  Fever - noted 06/06/2016; None today or yesterday - Had HR in 100's, pt meets SIRS criteria - will ask for UA Negative, urine and blood cultures pending, lactic acid and procalcitonin level - CT of Chest showed Small left greater than right pleural effusions, increased compared to prior CT. Interim development of partial consolidations within the bilateral lower lobes which may reflect atelectasis or pneumonia. Few scattered foci of ground-glass density within the right upper and left upper lobes, suspect small foci of inflammation or infection.  Moderate to large volume of ascites in the upper abdomen with extensive mesenteric and omental metastatic disease partially visualized.  Dilated gallbladder. - ABX will be given pending results above   Small Bowel Obstruction - 05/23/2016 abdominal x-ray multiple dilated loops of bowel concerning for small bowel obstruction - Likely due to the extensive peritoneal carcinomatosis - recurrent SBO, if re-occurs, she might need G tube as she is not a candidate for bowel Surgery per Gyn Onc and General Surgery.  - PCT consulted for further [place] discussion  - IR consulted and Tunneled Plurex Cather in place for ascites - Started on Octreotide per Palliative Care - Clear Liquid Diet started and NGT discontinued; Will have to see how patient tolerates without NGT; -TPN going  Mild Hyponatremia - Secondary to volume depletion vs. Hyponatremia from As[place]ites; Na+ is 134 and slowly improving - BMP in AM  Mild Thrombocytosis - likely reactive; Platelet went from 448 -> 483 -> 517  Protein Calorie Malnutrition - in the context of acute on chronic illness - nutritionist assistance appreciated    Anemia of chronic disease, malignancy - Hg overall stable  - Hb/Hct went from [phone] -> 9.2/27.4 -> 9.6/28.6 -> 10.0/29.9  Oral Thrush - Continue Diflucan   DVT prophylaxis: Lovenox 40 mg sq Code Status: FULL CODE Family Communication: Daughter present at bedside Disposition Plan: Pending Workup   Consultants:   Gyn Oncology Dr. Denman George and Dr. Alycia Rossetti  General Surgery  Oncology Dr. Marko Plume  Palliative Care Medicine  Interventional Radiology   Procedures: CT abdomen and pelvis 05/20/2016  Abdominal x-rays 05/23/2016, 05/24/2016, 05/25/2016, 05/27/2016, 05/28/2016, 05/30/2016, 05/28/2016, 05/29/2016  Right IJ single-lumen power port catheter insertion per Dr. Annamaria Boots 05/24/2016  2-D echo 05/24/2016  Ultrasound-guided paracentesis - 2.9 L of clear yellow fluid removed--Dr. Earleen Newport 05/22/2016 cytology positive for adenocarcinoma  Ultrasound-guided paracentesis - 4.0 L of clear yellow fluid removed 06/06/2016  CT Abd/Pelvis 06/06/16  Tunneled Peritoneal Drainage Catheter in place and 900 mL of Serous Pleural Fluid removed 06/08/16  NGT Removed 06/09/16   Antimicrobials: IV Ancef 05/24/16 x 1 dose   Diflucan 12/31 -->   IV Cefazolin for Pleurex Cathether  Subjective: Seen and examined at bedside and daughter translated as they refused to have outside translator again. Patient states her throat was hurting and  bothering her. No nausea. No Abdominal Pain today. Stated she was tolerating clears and passing gas and had a small bowel movement. No other concerns at this time.    Objective: Vitals:   06/08/16 1414 06/08/16 1445 06/08/16 2135 06/09/16 0445  BP: (!) 145/77 (!) 142/73 (!) 141/75 135/73  Pulse: 88 91 76 82  Resp: 20 18 16 16   Temp:  99 F (37.2 C) 98.5 F (36.9 C) 98.8 F (37.1 C) 99.3 F (37.4 C)  TempSrc: Oral Oral Oral Oral  SpO2: 95% 95% 97% 92%  Weight:      Height:        Intake/Output Summary (Last 24 hours) at 06/09/16 1234 Last data filed  at 06/09/16 0900  Gross per 24 hour  Intake          1777.83 ml  Output                0 ml  Net          1777.83 ml   Filed Weights   05/20/16 1332 05/20/16 1818 06/05/16 0621  Weight: 78 kg (171 lb 15.3 oz) 80.2 kg (176 lb 12.9 oz) 87.8 kg (193 lb 9 oz)    Examination: Physical Exam:  Constitutional: WN/WD, NAD and appears uncomfortable Eyes: Lids and conjunctivae normal, sclerae anicteric  ENMT: External Ears, Nose appear normal. Grossly normal hearing. NGT in place Neck: Appears normal, supple, no cervical masses, normal ROM, no appreciable thyromegaly Respiratory: Clear to auscultation bilaterally, no wheezing, rales, rhonchi or crackles. Normal respiratory effort and patient is not tachypenic. No accessory muscle use.  Cardiovascular: RRR, no murmurs / rubs / gallops. S1 and S2 auscultated. No extremity edema.  Abdomen: Soft, distended with some ascites. Mildly tender. No masses palpated. No appreciable hepatosplenomegaly. Bowel sounds positive x4 GU: Deferred. Musculoskeletal: No clubbing / cyanosis of digits/nails. No joint deformity upper and lower extremities.  Skin: No rashes, lesions, ulcers. No induration; Warm and dry.  Neurologic: CN 2-12 grossly intact with no focal deficits. Sensation intact in all 4 Extremities, DTR normal.  Psychiatric: Normal judgment and insight. Normal mood and appropriate affect.   Data Reviewed: I have personally reviewed following labs and imaging studies  CBC:  Recent Labs Lab 06/05/16 0443 06/06/16 0411 06/07/16 0529 06/07/16 1917 06/08/16 0423 06/09/16 0421  WBC 8.2 10.1 9.7 8.0 7.6 6.9  NEUTROABS 6.3  --   --   --  6.0 5.7  HGB 10.0* 9.7* 9.2* 10.2* 9.6* 10.0*  HCT 30.1* 29.3* 27.4* 30.0* 28.6* 29.9*  MCV 86.5 86.7 85.9 86.2 86.1 85.9  PLT 474* 455* 448* 494* 483* [id number]*   Basic Metabolic Panel:  Recent Labs Lab 06/03/16 0532 06/04/16 0727 06/05/16 0443 06/06/16 0411 06/07/16 0529 06/08/16 0423 06/09/16 0421  NA 132*  131* 131* 132* 131* 131* 134*  K 4.4 4.5 4.3 4.6 4.3 4.6 4.2  CL 100* 100* 99* 101 97* 98* 99*  CO2 26 23 25 25 27 27 28   GLUCOSE 163* 123* 144* 136* 162* 167* 147*  BUN 16 19 22* 22* 16 14 15   CREATININE 0.55 0.34* 0.63 0.68 0.55 0.55 0.60  CALCIUM 8.1* 8.0* 8.0* 8.0* 7.8* 8.1* 8.2*  MG 1.7 1.8 1.8  --   --  1.7 1.7  PHOS 3.2 3.4 3.7  --   --  3.6 3.4   GFR: Estimated Creatinine Clearance: 63.9 mL/min (by C-G formula based on SCr of 0.6 mg/dL). Liver Function Tests:  Recent Labs Lab 06/03/16 0532 06/04/16 0727 06/05/16 0443 06/08/16 0423 06/09/16 0421  AST 12* 14* 14* 16 23  ALT 7* 8* 9* 13* 18  ALKPHOS 73 78 85 111 112  BILITOT 0.4 0.5 0.3 0.3 0.3  PROT 5.5* 5.2* 5.0* 5.3* 5.3*  ALBUMIN 2.3* 2.2* 2.2* 2.0* 2.1*   No results for input(s): LIPASE, AMYLASE in the last 168 hours. No results for input(s): AMMONIA  in the last 168 hours. Coagulation Profile:  Recent Labs Lab 06/08/16 0423  INR 1.02   Cardiac Enzymes: No results for input(s): CKTOTAL, CKMB, CKMBINDEX, TROPONINI in the last 168 hours. BNP (last 3 results) No results for input(s): PROBNP in the last 8760 hours. HbA1C: No results for input(s): HGBA1C in the last 72 hours. CBG:  Recent Labs Lab 06/06/16 0818 06/06/16 1204 06/06/16 1834 06/07/16 0028 06/07/16 0648  GLUCAP 127* 124* 118* 119* 137*   Lipid Profile: No results for input(s): CHOL, HDL, LDLCALC, TRIG, CHOLHDL, LDLDIRECT in the last 72 hours. Thyroid Function Tests: No results for input(s): TSH, T4TOTAL, FREET4, T3FREE, THYROIDAB in the last 72 hours. Anemia Panel: No results for input(s): VITAMINB12, FOLATE, FERRITIN, TIBC, IRON, RETICCTPCT in the last 72 hours. Sepsis Labs:  Recent Labs Lab 06/06/16 1726 06/06/16 2007 06/08/16 0423  PROCALCITON 0.16  --  0.13  LATICACIDVEN 1.1 1.2  --     Recent Results (from the past 240 hour(s))  Culture, blood (routine x 2)     Status: None (Preliminary result)   Collection Time: 06/06/16   5:09 PM  Result Value Ref Range Status   Specimen Description BLOOD RIGHT HAND  Final   Special Requests IN PEDIATRIC BOTTLE 3CC  Final   Culture   Final    NO GROWTH 2 DAYS Performed at [facility]    Report Status PENDING  Incomplete  Culture, blood (routine x 2)     Status: None (Preliminary result)   Collection Time: 06/06/16  5:20 PM  Result Value Ref Range Status   Specimen Description BLOOD LEFT ARM  Final   Special Requests BOTTLES DRAWN AEROBIC AND ANAEROBIC [place]  Final   Culture   Final    NO GROWTH 2 DAYS Performed at [facility]    Report Status PENDING  Incomplete  Culture, Urine     Status: Abnormal   Collection Time: 06/07/16 12:32 AM  Result Value Ref Range Status   Specimen Description URINE, RANDOM  Final   Special Requests NONE  Final   Culture MULTIPLE SPECIES PRESENT, SUGGEST RECOLLECTION (A)  Final   Report Status 06/08/2016 FINAL  Final    Radiology Studies: Ir Perc Athena Masse Perit Cath Wo Port  Result Date: 06/08/2016 INDICATION: History of ovarian cancer recurrent symptomatic malignant ascites. Please perform ultrasound and fluoroscopic guided peritoneal drainage catheter placement for palliative purposes. EXAM: ULTRASOUND AND FLUOROSCOPIC GUIDED PLACEMENT OF TUNNELED PERITONEAL CATHETER COMPARISON:  Ultrasound-guided paracentesis - 06/06/2016; CT abdomen pelvis - 05/20/2016 MEDICATIONS: Ancef 2 gm IV; Antibiotics were administered within an appropriate time frame prior to skin puncture. CONTRAST:  None. ANESTHESIA/SEDATION: Moderate (conscious) sedation was employed during this procedure. A total of Versed 2 mg and Fentanyl 50 mcg was administered intravenously. Moderate Sedation Time: 18 minutes. The patient's level of consciousness and vital signs were monitored continuously by radiology nursing throughout the procedure under my direct supervision. FLUOROSCOPY TIME:  42 seconds (8 mGy) COMPLICATIONS: None immediate. TECHNIQUE: Informed written  consent was obtained from the patient after a discussion of the risks, benefits and alternatives to treatment. Questions regarding the procedure were encouraged and answered. A timeout was performed prior to the initiation of the procedure. Ultrasound scanning of the right lower abdominal quadrant demonstrates a small amount of fluid within the right lower abdominal quadrant. The right lower abdomen was prepped and draped usual sterile fashion a sterile drape was applied, covering the operative table. Maximum barrier sterile technique with sterile gowns and gloves  were used for the procedure. A timeout was performed prior to the initiation of the procedure. Local anesthesia was provided with 1% lidocaine with epinephrine. Under direct ultrasound guidance, an 18 gauge trocar needle was advanced into the peritoneal space with the right abdominal quadrant. Appropriate positioning was confirmed with the efflux of ascites. An Amplatz super stiff wire was advanced across the abdomen under intermittent fluoroscopic guidance. The access site was dilated over the Amplatz wire, allowing advancement of a peel-away sheath. The peritoneal catheter was tunneled in an antegrade fashion from a site along the right mid clavicular line to the access site and inserted through the peel-away sheath with tip ultimately terminating within midline of the lower abdomen. Several postprocedural spot abdominal radiographs were obtained. The catheter was connected to wall suction and approximately 900 cc of serous ascites was aspirated. The access site was closed with an interrupted 4-0 Vicryl suture, Dermabond and Steri-Strips. Dressings were placed. The patient tolerated procedure well without immediate postprocedural complication. FINDINGS: After successful ultrasound and fluoroscopic guided peritoneal drainage catheter placement, the right lower quadrant abdominal approach drainage catheter terminates within the midline of the lower abdomen  Following tunneled peritoneal drainage catheter placement, approximately 900 cc of serous fluid was aspirated. IMPRESSION: 1. Successful ultrasound and fluoroscopic guided placement of a tunneled peritoneal drainage catheter. 2. Successful aspiration of 900 cc of serous ascites following tunneled peritoneal drainage catheter placement. Electronically Signed   By: Sandi Mariscal M.D.   On: 06/08/2016 16:47   Scheduled Meds: . enoxaparin (LOVENOX) injection  40 mg Subcutaneous Q24H  . fluconazole (DIFLUCAN) IV  100 mg Intravenous Q24H  . pantoprazole (PROTONIX) IV  40 mg Intravenous Q24H  . sucralfate  1 g Oral TID WC & HS   Continuous Infusions: . sodium chloride 10 mL (06/09/16 0521)  . Marland KitchenTPN (CLINIMIX-E) Adult 70 mL/hr at 06/08/16 1722   And  . fat emulsion 240 mL (06/08/16 1722)  . Marland KitchenTPN (CLINIMIX-E) Adult     And  . fat emulsion    . octreotide  (SANDOSTATIN)    IV infusion 15 mcg/hr (06/09/16 0521)    LOS: 19 days    Kerney Elbe, DO [facility] Pager [phone]  If 7PM-7AM, please contact night-coverage [email or web address] Password [facility] 06/09/2016, 12:34 PM

## 2016-06-09 NOTE — Progress Notes (Signed)
CM continues to follow for DC needs. Pt had pleurex catheter placed and unit secretary ordered a box of drainage containers for pt. Will need order form for additional drainage containers either faxed from here at discharge or faxed from PCP office. Forms on front of chart in yellow envelope. Staff state that pt will be here over weekend. CM will continue to follow. Marney Doctor RN,BSN,NCM [phone]

## 2016-06-09 NOTE — Progress Notes (Signed)
Daily Progress Note   Patient Name: Audrey Garza       Date: 06/09/2016 DOB: Feb 18, 1941  Age: 76 y.o. MRN#: [id number] Attending Physician: Kerney Elbe, DO Primary Care Physician: No primary care provider on file. Admit Date: 05/20/2016  Reason for Consultation/Follow-up: Establishing goals of care, Non pain symptom management and Pain control  Subjective: I met today with patient and her daughter.  She had NG removed and is very happy about this.  Reports she is feeling much better and hopeful after having it removed.  We discussed again her obstruction, plan to see how she does with clears, decreasing octreotide (likely tomorrow), and potential she may still require venting g tube.  We also discussed that nutrition is going to continue to be a concern moving forward.  Length of Stay: 19  Current Medications: Scheduled Meds:  . enoxaparin (LOVENOX) injection  40 mg Subcutaneous Q24H  . fluconazole (DIFLUCAN) IV  100 mg Intravenous Q24H  . pantoprazole (PROTONIX) IV  40 mg Intravenous Q24H  . sucralfate  1 g Oral TID WC & HS    Continuous Infusions: . sodium chloride 10 mL (06/09/16 0521)  . Marland KitchenTPN (CLINIMIX-E) Adult 70 mL/hr at 06/08/16 1722   And  . fat emulsion 240 mL (06/08/16 1722)  . Marland KitchenTPN (CLINIMIX-E) Adult     And  . fat emulsion    . octreotide  (SANDOSTATIN)    IV infusion 15 mcg/hr (06/09/16 0521)    PRN Meds: acetaminophen (TYLENOL) oral liquid 160 mg/5 mL, albuterol, alteplase, alum & mag hydroxide-simeth, butamben-tetracaine-benzocaine, heparin lock flush, heparin lock flush, hydrALAZINE, HYDROmorphone (DILAUDID) injection, lidocaine, LORazepam, menthol-cetylpyridinium, ondansetron (ZOFRAN) IV, oxyCODONE-acetaminophen, phenol, promethazine, simethicone, sodium  chloride flush, sodium chloride flush, sodium chloride flush, zolpidem  Physical Exam        General: Alert, awake, in no acute distress.  HEENT: No bruits, no goiter, no JVD Heart: Regular rate and rhythm. No murmur appreciated. Lungs: Good air movement, clear Abdomen: Soft, nontender, mild distended, pleurex in place, positive bowel sounds.  Ext: No significant edema Skin: Warm and dry Neuro: Grossly intact, nonfocal.   Vital Signs: BP 135/73 (BP Location: Left Arm)   Pulse 82   Temp 99.3 F (37.4 C) (Oral)   Resp 16   Ht '5\' 3"'$  (1.6 m)   Wt 87.8 kg (  193 lb 9 oz)   SpO2 92%   BMI 34.29 kg/m  SpO2: SpO2: 92 % O2 Device: O2 Device: Not Delivered O2 Flow Rate: O2 Flow Rate (L/min): 2 L/min  Intake/output summary:  Intake/Output Summary (Last 24 hours) at 06/09/16 1226 Last data filed at 06/09/16 0900  Gross per 24 hour  Intake          1777.83 ml  Output                0 ml  Net          1777.83 ml   LBM: Last BM Date: 06/08/16 Baseline Weight: Weight: 78 kg (171 lb 15.3 oz) Most recent weight: Weight: 87.8 kg (193 lb 9 oz)       Palliative Assessment/Data:    Flowsheet Rows   Flowsheet Row Most Recent Value  Intake Tab  Referral Department  Hospitalist  Unit at Time of Referral  Oncology Unit  Palliative Care Primary Diagnosis  Cancer  Date Notified  06/05/16  Palliative Care Type  New Palliative care  Reason for referral  Clarify Goals of Care, Non-pain Symptom, Pain  Date of Admission  05/20/16  Date first seen by Palliative Care  06/06/16  # of days Palliative referral response time  1 Day(s)  # of days IP prior to Palliative referral  16  Clinical Assessment  Palliative Performance Scale Score  40%  Pain Max last 24 hours  9  Pain Min Last 24 hours  4  Psychosocial & Spiritual Assessment  Palliative Care Outcomes  Patient/Family meeting held?  Yes  Who was at the meeting?  Patient, 2 daughters, son in law, grandson      Patient Active Problem  List   Diagnosis Date Noted  . Acute esophagitis   . Peritoneal carcinomatosis (HCC)   . Thrush of mouth and esophagus (HCC)   . Abdominal distention   . Encounter for nasogastric (NG) tube placement   . Ileus (HCC)   . Impaired nasal gastric feeding tube   . Gastroesophageal reflux disease   . N&V (nausea and vomiting)   . Partial small bowel obstruction 05/24/2016  . Malnutrition of moderate degree 05/23/2016  . Malignant neoplasm of ovary (HCC)   . 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 female  with past medical history of ovarian cancer admitted on 05/20/2016 with bowel obstruction.  Palliative consulted for nausea related to obstruction.   Recommendations/Plan: - Nausea: NG out and patient tolerating small amounts of clear liquids per daughter.  I would recommend decreasing octreotide tomorrow to see how she responds.  If she cannot be weaned from octreotide, she will likely need venting G-tube.    Code Status:    Code Status Orders        Start     Ordered   05/20/16 1810  Full code  Continuous     05/20/16 1809    Code Status History    Date Active Date Inactive Code Status Order ID Comments User Context   This patient has a current code status but no historical code status.       Prognosis:   < 6 months  Discharge Planning:  To Be Determined  Care plan was discussed with patient, daughter, RN  Thank you for allowing the Palliative Medicine Team to assist in the care of this patient.   Time In: 1145 Time Out: 1210 Total Time 25 Prolonged  Time Billed No      Greater than 50%  of this time was spent counseling and coordinating care related to the above assessment and plan.  Micheline Rough, MD  Please contact Palliative Medicine Team phone at [phone] for questions and concerns.

## 2016-06-09 NOTE — Progress Notes (Signed)
Medical Oncology June 09, 2016, 8:50 AM  Hospital day 21 Diflucan day 6 Chemotherapy: day 11 cycle 1 doxil TNA since 05-31-16 Octreotide infusion begun 06-07-16  Spoke with Dr Alycia Rossetti on 06-07-16, with Dr Domingo Cocking on 06-07-16, their notes also reviewed. Discussed with Dr Alfredia Ferguson on 06-08-16.  Patient seen with daughter and unit RN now.  Subjective: Seems to be doing better, other than still very uncomfortable from NG. Per daughter, NG has been clamped for at least 2 days without nausea or vomiting. She is tolerating sips of liquids including soup, mango juice from home, coffee. Has not resumed Resource but will do that. Passing more gas and small bowel movement last pm. Intermittent cough minimally productive. Some pain left upper abdomen with cough. No problems with peritoneal catheter right side of abdomen. Tolerates po carafate dissolved in water, using for possible mucositis in throat from chemo / NG etc.   Objective: Vital signs in last 24 hours: Blood pressure 135/73, pulse 82, temperature 99.3 F (37.4 C), temperature source Oral, resp. rate 16, height 5\' 3"  (1.6 m), weight 193 lb 9 oz (87.8 kg), SpO2 92 %. Awake, alert, looks some better today. Slight cough x1 during visit, does not sound productive. Able to sit up with some assistance. Oral mucosa moist, no lesions now left buccal mucosa, one area right posterior pharynx, improved on right buccal mucosa, tongue still coated. Lungs with BS heart to bases anteriorly and posteriorly, no wheezes, no rales. PAC site ok, TNA infusing. NSL RUE. Heart RRR. Abdomen less distended, still seems to be some fluid wave. Good active BS, not high pitched or rushing.  Peritoneal drain site ok. Some bruising lower abdomen from lovenox. LE swelling resolved feet and lower legs, feet warm, no cords or tenderness. Moves all extremities. Skin no rash or irritation.  Intake/Output from previous day: 01/04 0701 - 01/05 0700 In: 1537.8 [P.O.:240; I.V.:1247.8; IV  Piggyback:50] Out: -  Intake/Output this shift: No intake/output data recorded.  Lab Results:  Recent Labs  06/08/16 0423 06/09/16 0421  WBC 7.6 6.9  HGB 9.6* 10.0*  HCT 28.6* 29.9*  PLT 483* 517*   BMET  Recent Labs  06/08/16 0423 06/09/16 0421  NA 131* 134*  K 4.6 4.2  CL 98* 99*  CO2 27 28  GLUCOSE 167* 147*  BUN 14 15  CREATININE 0.55 0.60  CALCIUM 8.1* 8.2*    Studies/Results: Ir Perc Athena Masse Perit Cath Wo Port  Result Date: 06/08/2016 INDICATION: History of ovarian cancer recurrent symptomatic malignant ascites. Please perform ultrasound and fluoroscopic guided peritoneal drainage catheter placement for palliative purposes. EXAM: ULTRASOUND AND FLUOROSCOPIC GUIDED PLACEMENT OF TUNNELED PERITONEAL CATHETER COMPARISON:  Ultrasound-guided paracentesis - 06/06/2016; CT abdomen pelvis - 05/20/2016 MEDICATIONS: Ancef 2 gm IV; Antibiotics were administered within an appropriate time frame prior to skin puncture. CONTRAST:  None. ANESTHESIA/SEDATION: Moderate (conscious) sedation was employed during this procedure. A total of Versed 2 mg and Fentanyl 50 mcg was administered intravenously. Moderate Sedation Time: 18 minutes. The patient's level of consciousness and vital signs were monitored continuously by radiology nursing throughout the procedure under my direct supervision. FLUOROSCOPY TIME:  42 seconds (8 mGy) COMPLICATIONS: None immediate. TECHNIQUE: Informed written consent was obtained from the patient after a discussion of the risks, benefits and alternatives to treatment. Questions regarding the procedure were encouraged and answered. A timeout was performed prior to the initiation of the procedure. Ultrasound scanning of the right lower abdominal quadrant demonstrates a small amount of fluid within the right lower  abdominal quadrant. The right lower abdomen was prepped and draped usual sterile fashion a sterile drape was applied, covering the operative table. Maximum barrier  sterile technique with sterile gowns and gloves were used for the procedure. A timeout was performed prior to the initiation of the procedure. Local anesthesia was provided with 1% lidocaine with epinephrine. Under direct ultrasound guidance, an 18 gauge trocar needle was advanced into the peritoneal space with the right abdominal quadrant. Appropriate positioning was confirmed with the efflux of ascites. An Amplatz super stiff wire was advanced across the abdomen under intermittent fluoroscopic guidance. The access site was dilated over the Amplatz wire, allowing advancement of a peel-away sheath. The peritoneal catheter was tunneled in an antegrade fashion from a site along the right mid clavicular line to the access site and inserted through the peel-away sheath with tip ultimately terminating within midline of the lower abdomen. Several postprocedural spot abdominal radiographs were obtained. The catheter was connected to wall suction and approximately 900 cc of serous ascites was aspirated. The access site was closed with an interrupted 4-0 Vicryl suture, Dermabond and Steri-Strips. Dressings were placed. The patient tolerated procedure well without immediate postprocedural complication. FINDINGS: After successful ultrasound and fluoroscopic guided peritoneal drainage catheter placement, the right lower quadrant abdominal approach drainage catheter terminates within the midline of the lower abdomen Following tunneled peritoneal drainage catheter placement, approximately 900 cc of serous fluid was aspirated. IMPRESSION: 1. Successful ultrasound and fluoroscopic guided placement of a tunneled peritoneal drainage catheter. 2. Successful aspiration of 900 cc of serous ascites following tunneled peritoneal drainage catheter placement. Electronically Signed   By: Sandi Mariscal M.D.   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  Received first doxil 05-30-16, counts maintaining, possibly some oral/ esophageal mucositis. Not candidate for surgery including for the bowel obstruction per gyn onc and general surgery. Paracentesis 2.9 liters malignant ascites on 05-22-16,  4 liters 06-06-16 and almost 1 liter with peritoneal drain placementon 06-08-16.  May not be platinum resistant, last CDDP 10-2015 and last carboplatin 07-07-15 by review of records from [place]. Could get improvement/ some period of control of diseasefrom more chemotherapy; she wasin very good condition otherwise at time of recurrence, tho has declined in hospital. Short term TNA begun 05-31-16. Gyn oncology review will be appreciated. 2. Small bowel obstruction: tolerating NG clamped over 24 hrs / may have been 2 days. On octreotide infusion. Best bowel sounds this AM that I have heard, passing more gas and some BM last night. Expect ok to remove NG now if hospitalist agrees.  3. Occasional cough, no further fever. Encouraged incentive spirometer, deep breathing also easier since paracentesis. May be throat irritation from NG 4.marked diverticulosis by CT, without evidence diverticulitis then.  5.inguinal hernias containing only fat 6.aortic atherosclerosis by CT 7.social situation: recently moved from [place] to be with daughters in [place], family very supportive. Patient speaks Spanish. No insurance tho may be medicaid eligible. Will need to know if any insurance options for DC planning.  8. Nutrition: poor po intake beginning 2-3 days prior to admission, essentially nothing po in hospital,  short term TNA begun 12-27 . Needs to use Resource if tolerating clears po.  9.PAC placed by IR 10. Echocardiogram with good EF 05-25-16, for doxil 11. flu vaccine done 05-28-16 12.oral candida: IV diflucan started 06-04-16, thrush seems better and likely  DC after 7 days. May also be mucositis  mouth and throat from doxil.   Please call or page if I can help between my rounds. Pager [phone] ,cell [place]  Evlyn Clines  MD

## 2016-06-09 NOTE — Progress Notes (Signed)
Referring Physician(s): Terrilee Files  Supervising Physician: Aletta Edouard  Patient Status:  [facility] - In-pt  Chief Complaint:  Metastatic ovarian cancer, recurrent ascites  Subjective:  Pt seems to be tolerating NG clamping ok; taking in liquids, +BM yesterday; denies N/V or worsening abd pain; some abd discomfort when coughing; ambulating in hallway  Allergies: Patient has no known allergies.  Medications: Prior to Admission medications   Medication Sig Start Date End Date Taking? Authorizing Provider  acetaminophen (TYLENOL) 325 MG tablet Take 650 mg by mouth every 5 (five) hours as needed for moderate pain.   Yes Historical Provider, MD  magnesium 30 MG tablet Take 30 mg by mouth 2 (two) times daily.   Yes Historical Provider, MD  Potassium 75 MG TABS Take 75 mg by mouth daily.   Yes Historical Provider, MD  simethicone (GAS-X) 80 MG chewable tablet Chew 160 mg by mouth every 6 (six) hours as needed for flatulence.   Yes Historical Provider, MD  lidocaine-prilocaine (EMLA) cream Apply to port 1 hour before access with needle. Cover with plastic wrap. 05/30/16   Lennis Marion Downer, MD  ondansetron (ZOFRAN ODT) 8 MG disintegrating tablet Take 1 tablet (8 mg total) by mouth every 8 (eight) hours as needed for nausea or vomiting. 05/30/16   Lennis Marion Downer, MD     Vital Signs: BP 135/73 (BP Location: Left Arm)   Pulse 82   Temp 99.3 F (37.4 C) (Oral)   Resp 16   Ht 5\' 3"  (1.6 m)   Wt 193 lb 9 oz (87.8 kg)   SpO2 92%   BMI 34.29 kg/m   Physical Exam rt perit drain intact, dressing clean and dry, site NT  Imaging: Ct Chest Wo Contrast  Result Date: 06/06/2016 CLINICAL DATA:  History of metastatic ovarian cancer presents with progressive abdominal bloating and pain for 3 weeks. Fever and dyspnea EXAM: CT CHEST WITHOUT CONTRAST TECHNIQUE: Multidetector CT imaging of the chest was performed following the standard protocol without IV contrast. COMPARISON:  Chest x-ray  06/05/2016, CT abdomen pelvis 05/20/2016 FINDINGS: Cardiovascular: Limited evaluation without the presence of intravenous contrast. Atherosclerotic calcifications of the aorta. Right-sided central venous catheter is present, the tip terminates in the distal SVC. There are coronary artery calcifications. Heart size nonenlarged. No significant pericardial effusion. Mediastinum/Nodes: Imaged thyroid gland within normal limits. Trachea is midline. Esophageal tube is present and there is a moderate hiatal hernia, the tip terminates in the proximal stomach. Limited assessment for hilar adenopathy without contrast. Subcentimeter nonspecific mediastinal lymph nodes. Lungs/Pleura: Small bilateral pleural effusions, increased compared to prior CT, larger on the left side. Interim development of partial consolidation within the bilateral lower lobes. Few mild foci of ground-glass density within the apical portion of the right upper lobe and the subpleural anterior left upper lobe which may reflect small foci of infection or inflammation. No pneumothorax. Upper Abdomen: Moderate volume ascites in the upper abdomen. Extensive mesenteric and omental nodularity as noted on prior CT and consistent with metastatic disease. Gallbladder appears dilated. Musculoskeletal: No suspicious bone lesions. IMPRESSION: 1. Small left greater than right pleural effusions, increased compared to prior CT. Interim development of partial consolidations within the bilateral lower lobes which may reflect atelectasis or pneumonia. Few scattered foci of ground-glass density within the right upper and left upper lobes, suspect small foci of inflammation or infection. 2. Moderate to large volume of ascites in the upper abdomen with extensive mesenteric and omental metastatic disease partially visualized. 3. Dilated gallbladder. Electronically  Signed   By: Donavan Foil M.D.   On: 06/06/2016 20:07   US Paracentesis  Result Date: 06/06/2016 INDICATION:  Patient with a history of metastatic ovarian cancer now with recurrent abdominal ascites. Request is made for therapeutic paracentesis. EXAM: ULTRASOUND GUIDED THERAPEUTIC PARACENTESIS MEDICATIONS: 1% lidocaine COMPLICATIONS: None immediate. PROCEDURE: Informed written consent was obtained from the patient after a discussion of the risks, benefits and alternatives to treatment. A timeout was performed prior to the initiation of the procedure. Initial ultrasound scanning demonstrates a moderate amount of ascites within the right lower abdominal quadrant. The right lower abdomen was prepped and draped in the usual sterile fashion. 1% lidocaine was used for local anesthesia. Following this, a 19 gauge, 7-cm, Yueh catheter was introduced. An ultrasound image was saved for documentation purposes. The paracentesis was performed. The catheter was removed and a dressing was applied. The patient tolerated the procedure well without immediate post procedural complication. FINDINGS: A total of approximately 4 L of yellow fluid was removed. IMPRESSION: Successful ultrasound-guided paracentesis yielding 4 liters of peritoneal fluid. Read by: Saverio Danker, PA-C Electronically Signed   By: Sandi Mariscal M.D.   On: 06/06/2016 11:31   Ir Perc Athena Masse Perit Cath Wo Port  Result Date: 06/08/2016 INDICATION: History of ovarian cancer recurrent symptomatic malignant ascites. Please perform ultrasound and fluoroscopic guided peritoneal drainage catheter placement for palliative purposes. EXAM: ULTRASOUND AND FLUOROSCOPIC GUIDED PLACEMENT OF TUNNELED PERITONEAL CATHETER COMPARISON:  Ultrasound-guided paracentesis - 06/06/2016; CT abdomen pelvis - 05/20/2016 MEDICATIONS: Ancef 2 gm IV; Antibiotics were administered within an appropriate time frame prior to skin puncture. CONTRAST:  None. ANESTHESIA/SEDATION: Moderate (conscious) sedation was employed during this procedure. A total of Versed 2 mg and Fentanyl 50 mcg was administered  intravenously. Moderate Sedation Time: 18 minutes. The patient's level of consciousness and vital signs were monitored continuously by radiology nursing throughout the procedure under my direct supervision. FLUOROSCOPY TIME:  42 seconds (8 mGy) COMPLICATIONS: None immediate. TECHNIQUE: Informed written consent was obtained from the patient after a discussion of the risks, benefits and alternatives to treatment. Questions regarding the procedure were encouraged and answered. A timeout was performed prior to the initiation of the procedure. Ultrasound scanning of the right lower abdominal quadrant demonstrates a small amount of fluid within the right lower abdominal quadrant. The right lower abdomen was prepped and draped usual sterile fashion a sterile drape was applied, covering the operative table. Maximum barrier sterile technique with sterile gowns and gloves were used for the procedure. A timeout was performed prior to the initiation of the procedure. Local anesthesia was provided with 1% lidocaine with epinephrine. Under direct ultrasound guidance, an 18 gauge trocar needle was advanced into the peritoneal space with the right abdominal quadrant. Appropriate positioning was confirmed with the efflux of ascites. An Amplatz super stiff wire was advanced across the abdomen under intermittent fluoroscopic guidance. The access site was dilated over the Amplatz wire, allowing advancement of a peel-away sheath. The peritoneal catheter was tunneled in an antegrade fashion from a site along the right mid clavicular line to the access site and inserted through the peel-away sheath with tip ultimately terminating within midline of the lower abdomen. Several postprocedural spot abdominal radiographs were obtained. The catheter was connected to wall suction and approximately 900 cc of serous ascites was aspirated. The access site was closed with an interrupted 4-0 Vicryl suture, Dermabond and Steri-Strips. Dressings were  placed. The patient tolerated procedure well without immediate postprocedural complication. FINDINGS: After successful ultrasound  and fluoroscopic guided peritoneal drainage catheter placement, the right lower quadrant abdominal approach drainage catheter terminates within the midline of the lower abdomen Following tunneled peritoneal drainage catheter placement, approximately 900 cc of serous fluid was aspirated. IMPRESSION: 1. Successful ultrasound and fluoroscopic guided placement of a tunneled peritoneal drainage catheter. 2. Successful aspiration of 900 cc of serous ascites following tunneled peritoneal drainage catheter placement. Electronically Signed   By: Sandi Mariscal M.D.   On: 06/08/2016 16:47   Dg Chest Port 1v Same Day  Result Date: 06/05/2016 CLINICAL DATA:  Shortness of Breath EXAM: PORTABLE CHEST 1 VIEW COMPARISON:  None. FINDINGS: Right Port-A-Cath tip is in the lower SVC. NG tube enters the stomach. Heart is normal size. Low lung volumes. Right perihilar and left base atelectasis. No effusions. No acute bony abnormality. IMPRESSION: Areas of atelectasis bilaterally.  Low lung volumes. Electronically Signed   By: Rolm Baptise M.D.   On: 06/05/2016 14:27   Dg Abd Portable 1v  Result Date: 06/05/2016 CLINICAL DATA:  Patient with shortness of breath. EXAM: PORTABLE ABDOMEN - 1 VIEW COMPARISON:  Abdominal radiograph 05/31/2016. FINDINGS: Enteric tube tip and side-port project over the left upper quadrant. Re- demonstrated gaseous distended loops of small bowel within the central abdomen measuring up to 5.5 cm. Gas is demonstrated within the colon. Supine evaluation limited for the detection of free intraperitoneal air. Lumbar spine degenerative changes. IMPRESSION: Re- demonstrated distended loops of small bowel within the central abdomen concerning for obstruction, potentially partial given colonic gas. Electronically Signed   By: Lovey Newcomer M.D.   On: 06/05/2016 14:30     Labs:  CBC:  Recent Labs  06/07/16 0529 06/07/16 1917 06/08/16 0423 06/09/16 0421  WBC 9.7 8.0 7.6 6.9  HGB 9.2* 10.2* 9.6* 10.0*  HCT 27.4* 30.0* 28.6* 29.9*  PLT 448* 494* 483* 517*    COAGS:  Recent Labs  05/21/16 0351 06/08/16 0423  INR 0.96 1.02    BMP:  Recent Labs  06/06/16 0411 06/07/16 0529 06/08/16 0423 06/09/16 0421  NA 132* 131* 131* 134*  K 4.6 4.3 4.6 4.2  CL 101 97* 98* 99*  CO2 25 27 27 28   GLUCOSE 136* 162* 167* 147*  BUN 22* 16 14 15   CALCIUM 8.0* 7.8* 8.1* 8.2*  CREATININE 0.68 0.55 0.55 0.60  GFRNONAA >60 >60 >60 >60  GFRAA >60 >60 >60 >60    LIVER FUNCTION TESTS:  Recent Labs  06/04/16 0727 06/05/16 0443 06/08/16 0423 06/09/16 0421  BILITOT 0.5 0.3 0.3 0.3  AST 14* 14* 16 23  ALT 8* 9* 13* 18  ALKPHOS 78 85 111 112  PROT 5.2* 5.0* 5.3* 5.3*  ALBUMIN 2.2* 2.2* 2.0* 2.1*    Assessment and Plan: Metastatic ovarian cancer, recurrent ascites; s/p tunneled perit cath placement 1/4 with 900 cc ascitic fluid removed; temp 99.3, WBC nl; hgb stable; creat nl; further plans as per IM, gyn/oncology; drain ascites via cath prn/keep access site clean and dry   Electronically Signed: D. Rowe Robert 06/09/2016, 9:33 AM   I spent a total of 15 minutes at the the patient's bedside AND on the patient's hospital floor or unit, greater than 50% of which was counseling/coordinating care for peritoneal drain    Patient ID: Renaldo Reel, female   DOB: 1941/05/08, 76 y.o.   MRN: [id number]

## 2016-06-09 NOTE — Progress Notes (Signed)
PHARMACY - ADULT TOTAL PARENTERAL NUTRITION CONSULT NOTE   Pharmacy Consult for TPN Indication: partial small bowel obstruction  Patient Measurements: Height: 5\' 3"  (160 cm) Weight: 193 lb 9 oz (87.8 kg) IBW/kg (Calculated) : 52.4 TPN AdjBW (KG): 58.8 Body mass index is 34.29 kg/m.  Insulin Requirements: none  Current Nutrition: CLD; TPN at goal rate  IVF: NS at 10 ml/hr KVO  Central access: implanted port TPN start date: 12/27  ASSESSMENT                                                                                                          HPI: 68 yoF with h/o metastatic ovarian cancer with extensive peritoneal disease with omental thickening with large malignant ascites and partial SBO likely 2/2 metastatic disease. Per MD note, has been 12 days since she has been able to take anything PO. She has NGT to [facility] and has failed multiple attempts to d/c NG and advance diet.  TPN to start 12/27.  Significant events:  12/26 D1C1 Doxil 40mg /m2 - tolerated well 12/27 TPN started 12/28 May allow small sips of fluids 1/2: 4L paracentesis 1/3: trial of octreotide infusion 1/4: pleurx catheter placed for management of continued ascites and distention; 900 mL ascitic fluid removed  Today, 06/09/2016:   Glucose - no h/o DM, CBGs were controlled with minimal SSI use; CBG checks and SSI stopped 1/3 for patient comfort  Electrolytes - Na and Cl remain slightly low but stable  Renal - SCr WNL, CrCl 60 ml/min  LFTs - low, stable  TGs - wnl  Prealbumin - <5 (12/28), <5 (1/1)  NG clamped since 1/3 w/o N/V and tolerating sips of liquids. NG may be removed soon. MD encouraging use of PO liquids.  NUTRITIONAL GOALS                                                                                             RD recs (12/26): 1750-1950 kcal, 75-85g protein, 1.7-1.9L/day fluid Clinimix E 5/20 at a goal rate of 70 ml/hr + 20% fat emulsion at 10 ml/hr to provide: 84 g/day protein, 1958  Kcal/day.  (+++There is currently a Psychologist, prison and probation services of Clinimix solution.  Pharmacy will use Clinimix product based on what we have available in stock+++)  PLAN  At 1800 today:  Continue Clinimix E 5/20 at goal 70 ml/hr  20% fat emulsion at 10 ml/hr.  TPN to contain standard multivitamins and trace elements.  Continue [facility]F at [facility].  TPN lab panels on Mondays & Thursdays   Hershal Coria, PharmD, BCPS Pager: [phone] 06/09/2016 8:52 AM

## 2016-06-10 LAB — CREATININE, SERUM: CREATININE: 0.52 mg/dL (ref 0.44–1.00)

## 2016-06-10 LAB — PROCALCITONIN: Procalcitonin: 0.15 ng/mL

## 2016-06-10 LAB — PHOSPHORUS: Phosphorus: 3.4 mg/dL (ref 2.5–4.6)

## 2016-06-10 LAB — MAGNESIUM: MAGNESIUM: 1.8 mg/dL (ref 1.7–2.4)

## 2016-06-10 MED ORDER — TRACE MINERALS CR-CU-MN-SE-ZN 10-1000-500-60 MCG/ML IV SOLN
INTRAVENOUS | Status: DC
  Filled 2016-06-10: qty 1680

## 2016-06-10 MED ORDER — FAT EMULSION 20 % IV EMUL
120.0000 mL | INTRAVENOUS | Status: AC
  Administered 2016-06-10: 120 mL via INTRAVENOUS
  Filled 2016-06-10: qty 250

## 2016-06-10 MED ORDER — FAT EMULSION 20 % IV EMUL
240.0000 mL | INTRAVENOUS | Status: DC
  Filled 2016-06-10: qty 250

## 2016-06-10 MED ORDER — TRACE MINERALS CR-CU-MN-SE-ZN 10-1000-500-60 MCG/ML IV SOLN
INTRAVENOUS | Status: AC
  Administered 2016-06-10: 18:00:00 via INTRAVENOUS
  Filled 2016-06-10: qty 1200

## 2016-06-10 NOTE — Progress Notes (Signed)
Daily Progress Note   Patient Name: Audrey Garza       Date: 06/10/2016 DOB: Jan 14, 1941  Age: 76 y.o. MRN#: [id number] Attending Physician: Kerney Elbe, DO Primary Care Physician: No primary care provider on file. Admit Date: 05/20/2016  Reason for Consultation/Follow-up: Establishing goals of care, Non pain symptom management and Pain control  Subjective: I met today with patient and her daughter.  We discussed again her obstruction, plan to continue to advance diet, decreasing octreotide, and potential she may still require venting g tube if she is not able to be weaned from octreotide.  She understands to let us know if she becomes sick after decreasing infusion rate so that is can be increased again rather than reinsert NG.  We also discussed that nutrition is going to continue to be a concern moving forward.  Length of Stay: 20  Current Medications: Scheduled Meds:  . enoxaparin (LOVENOX) injection  40 mg Subcutaneous Q24H  . fluconazole (DIFLUCAN) IV  100 mg Intravenous Q24H  . pantoprazole (PROTONIX) IV  40 mg Intravenous Q24H  . sucralfate  1 g Oral TID WC & HS    Continuous Infusions: . sodium chloride 10 mL (06/09/16 0521)  . Marland KitchenTPN (CLINIMIX-E) Adult     And  . fat emulsion    . Marland KitchenTPN (CLINIMIX-E) Adult 70 mL/hr at 06/09/16 1721   And  . fat emulsion 240 mL (06/09/16 1721)  . octreotide  (SANDOSTATIN)    IV infusion 15 mcg/hr (06/09/16 0521)    PRN Meds: acetaminophen (TYLENOL) oral liquid 160 mg/5 mL, albuterol, alteplase, alum & mag hydroxide-simeth, butamben-tetracaine-benzocaine, guaiFENesin-dextromethorphan, heparin lock flush, heparin lock flush, hydrALAZINE, HYDROmorphone (DILAUDID) injection, lidocaine, LORazepam, menthol-cetylpyridinium, ondansetron (ZOFRAN)  IV, oxyCODONE-acetaminophen, phenol, promethazine, simethicone, sodium chloride flush, sodium chloride flush, sodium chloride flush, zolpidem  Physical Exam        General: Alert, awake, in no acute distress.  Sitting in bedside chair HEENT: No bruits, no goiter, no JVD Heart: Regular rate and rhythm. No murmur appreciated. Lungs: Good air movement, clear Abdomen: Soft, nontender, mild distended, pleurex in place, positive bowel sounds.  Ext: No significant edema Skin: Warm and dry Neuro: Grossly intact, nonfocal.   Vital Signs: BP 113/62 (BP Location: Left Arm)   Pulse 77   Temp 98.6 F (37 C) (Oral)   Resp  16   Ht '5\' 3"'$  (1.6 m)   Wt 87.8 kg (193 lb 9 oz)   SpO2 98%   BMI 34.29 kg/m  SpO2: SpO2: 98 % O2 Device: O2 Device: Not Delivered O2 Flow Rate: O2 Flow Rate (L/min): 2 L/min  Intake/output summary:   Intake/Output Summary (Last 24 hours) at 06/10/16 1225 Last data filed at 06/10/16 1000  Gross per 24 hour  Intake           2923.5 ml  Output              800 ml  Net           2123.5 ml   LBM: Last BM Date: 06/10/16 Baseline Weight: Weight: 78 kg (171 lb 15.3 oz) Most recent weight: Weight: 87.8 kg (193 lb 9 oz)       Palliative Assessment/Data:    Flowsheet Rows   Flowsheet Row Most Recent Value  Intake Tab  Referral Department  Hospitalist  Unit at Time of Referral  Oncology Unit  Palliative Care Primary Diagnosis  Cancer  Date Notified  06/05/16  Palliative Care Type  New Palliative care  Reason for referral  Clarify Goals of Care, Non-pain Symptom, Pain  Date of Admission  05/20/16  Date first seen by Palliative Care  06/06/16  # of days Palliative referral response time  1 Day(s)  # of days IP prior to Palliative referral  16  Clinical Assessment  Palliative Performance Scale Score  40%  Pain Max last 24 hours  9  Pain Min Last 24 hours  4  Psychosocial & Spiritual Assessment  Palliative Care Outcomes  Patient/Family meeting held?  Yes  Who  was at the meeting?  Patient, 2 daughters, son in law, grandson      Patient Active Problem List   Diagnosis Date Noted  . Palliative care encounter   . Acute esophagitis   . Peritoneal carcinomatosis ([place])   . Thrush of mouth and esophagus ([place])   . Abdominal distention   . Encounter for nasogastric (NG) tube placement   . Ileus ([place])   . Impaired nasal gastric feeding tube   . Gastroesophageal reflux disease   . N&V (nausea and vomiting)   . SBO (small bowel obstruction) 05/24/2016  . Malnutrition of moderate degree 05/23/2016  . Ovarian carcinoma ([place])   . 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 female  with past medical history of ovarian cancer admitted on 05/20/2016 with bowel obstruction.  Palliative consulted for nausea related to obstruction.   Recommendations/Plan: - Nausea: NG out and patient tolerating clear liquids per daughter.  Will decrease octreotide to 59mg/hr to see how she responds.  If she becomes sick again, would increase back to 152m/hr to see is she feels better before attempting to reinsert NG.  If she cannot be weaned from octreotide, she will likely need venting G-tube.    Code Status:    Code Status Orders        Start     Ordered   05/20/16 1810  Full code  Continuous     05/20/16 1809    Code Status History    Date Active Date Inactive Code Status Order ID Comments User Context   This patient has a current code status but no historical code status.       Prognosis:   < 6 months  Discharge Planning:  To Be Determined  Care  plan was discussed with patient, daughter, RN  Thank you for allowing the Palliative Medicine Team to assist in the care of this patient.   Time In: 1200 Time Out: 1220 Total Time 20 Prolonged Time Billed No      Greater than 50%  of this time was spent counseling and coordinating care related to the above assessment and plan.  Micheline Rough,  MD  Please contact Palliative Medicine Team phone at [phone] for questions and concerns.

## 2016-06-10 NOTE — Progress Notes (Signed)
Pt's daughter requested for [place] to drain the pleurx catheter because pts abd looked more distended and was reporting some discomfort in the lower area. Drained 175cc of clear yellow fluid. Daughter inquired as to why the abdomen was still "large". Discussed other causes of distention such as the SBO, gas, etc. Pt tolerated the procedure well. Continue to monitor. Hortencia Conradi RN.

## 2016-06-10 NOTE — Progress Notes (Addendum)
PROGRESS NOTE    Audrey Garza  [id number] DOB: 11-Jul-1940 DOA: 05/20/2016 PCP: No primary care provider on file.  Brief Narrative:  76 year old female with a history of metastatic ovarian cancer diagnosed in January 2017 (s/p primary debulking surgery followed by 8 adjuvant cycles of carboplatin and paclitaxel and 4 additional cycles of carboplatin, paclitaxel, avastin), last chemo Sept 2017, now presented with progressive abdominal bloating and pain for 3 weeks duration. She was feeling well until the first week of December, 2017 when she was sick with a virus from which she never recovered. She states that she began feeling distended in the abdomen and uncomfortable and was taken to the ED on 05/20/16.  CT chest/abd/pelvis9-21-17 reportedly with no evidence of malignancy, with some bullous emphysema, tortuous sigmoid colon with diverticulosis. . Recommendation was for maintenance avastin x 1 year, however patient then came to [facility] in 02-2016. She has not established care with any physician since coming to [place]. CT AP 05-20-16 showed large volume ascites, extensive peritoneal disease with omental thickening, trace bilateral pleural effusions, left colonic diverticulosis, bilateral inguinal hernias containing fat, post hysterectomy, no adnexal masses, no adenopathy. US paracentesis 05-22-16 revealed 2.9 liters of yellow fluid. 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 Unfortunately, after paracentesis she began having increasing abdominal pain. Abdominal x-ray revealed distended small bowel loops concerning for small bowel obstruction. Gen. surgery was consulted. NG was inserted to suction. She is s/p repeat Paracentesis 06-06-16 which removed 4 liters. Today she went down for Pleux of abdomen with 900 mL removed. Patient was transitioned to Clear Liquid Diet and NGT was removed yesterday. Diet was advanced to Full Liquid Diet and Patient was having bowel movements now.    Assessment & Plan:   Active Problems:   Malignant ascites   Malnutrition of moderate degree   Ovarian carcinoma ([facility])   SBO (small bowel obstruction)   Gastroesophageal reflux disease   N&V (nausea and vomiting)   Impaired nasal gastric feeding tube   Ileus ([facility])   Abdominal distention   Encounter for nasogastric (NG) tube placement   Thrush of mouth and esophagus ([facility])   Acute esophagitis   Peritoneal carcinomatosis ([place])   Palliative care encounter  Metastatic ovarian cancer with extensive peritoneal disease with omental thickening and peritoneal carcinomatosis with large ascites s/p Pleurx Catheter Drain - Appreciate Drs. Livesay and Rossi's recommendations. Dr. Nancy Marus of [place].  - Progressive within 4 months of last systemic treatment, she has been taxol + avastin 02-02-16; Received Doxil during hospitalization on 05/30/16 - rapidly symptomatic with malignant ascites and peritoneal carcinomatosis.  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 Denman George, pt is not a candidate for interval debulking surgery due to the ? platinum resistant nature of her disease; Dr. Alycia Rossetti re-evaluated per Dr. Mariana Kaufman recommendatons - if pt fails advancement of diet, she should be considered for palliative G tube (Discussed with surgery and they signed off). Consulted IR for G tube placement and recommend Ascites to be controlled/resolved prior to Gastrostomy tube placement.  - Unclear patient's prognosis and response to recent doxil - symptomatic management with pain control  - palliative care team consulted for assistance - Started on Octreotide by Dr. Domingo Cocking  and agree; Octreotide dose  reduced by Palliative Care Medicine.  -Dr. Alycia Rossetti consulted and recommended Plerux Cathether drain which was placed by IR  -Pluerx Cath in place and is s/p 900 mL of removal -Diet advanced to Full Liquids and patient tolerating and had bowel movements today; IF continues to improve will advance to Soft Diet tomorrow; Will Slowly Wean TPA and discussed with Pharmacy to reduce dose tonight  Fever, IMPROVED - noted 06/06/2016;  - Had HR in 100's, pt meets SIRS criteria - will ask for UA Negative, urine and blood cultures pending, lactic acid and procalcitonin level - CT of Chest showed Small left greater than right pleural effusions, increased compared to prior CT. Interim development of partial consolidations within the bilateral lower lobes which may reflect atelectasis or pneumonia. Few scattered foci of ground-glass density within the right upper and left upper lobes, suspect small foci of inflammation or infection.  Moderate to large volume of ascites in the upper abdomen with extensive mesenteric and omental metastatic disease partially visualized.  Dilated gallbladder. - ABX will be given pending results above   Small Bowel Obstruction, improving - 05/23/2016 abdominal x-ray multiple dilated loops of bowel concerning for small bowel obstruction - Likely due to the extensive peritoneal carcinomatosis - recurrent SBO, if re-occurs, she might need G tube as she is not a candidate for bowel Surgery per Gyn Onc and General Surgery.  - PCT consulted for further [place] discussion  - IR consulted and Tunneled Plurex Cather in place for ascites - Octreotide per Palliative Care; Dose Reduced by Dr. Domingo Cocking -Tolerated Clears and Advanced to Full Liquid Diet today; Had Bowel Movements today -TPN going but will decerase it slowly and wean by tomorrow hopefully  Mild Hyponatremia - Secondary to volume depletion vs. Hyponatremia from Asicites; Na+ is 131 - BMP in AM  Mild Thrombocytosis - likely  reactive; Platelet went from 448 -> 483 -> 517 -> 529  Protein Calorie Malnutrition - in the context of acute on chronic illness - nutritionist assistance appreciated   Anemia of chronic disease, malignancy - Hg overall stable  - Hb/Hct went from [phone] -> 9.2/27.4 -> 9.6/28.6 -> 10.0/29.9 -> 10.0/30.7  Oral Thrush - Continue Diflucan   DVT prophylaxis: Lovenox 40 mg sq Code Status: FULL CODE Family Communication: Daughter present at bedside Disposition Plan: SNF at D/C when Stable  Consultants:   Gyn Oncology Dr. Denman George and Dr. Alycia Rossetti  General Surgery  Oncology Dr. Marko Plume  Palliative Care Medicine  Interventional Radiology   Procedures: CT abdomen and pelvis 05/20/2016  Abdominal x-rays 05/23/2016, 05/24/2016, 05/25/2016, 05/27/2016, 05/28/2016, 05/30/2016, 05/28/2016, 05/29/2016  Right IJ single-lumen power port catheter insertion per Dr. Annamaria Boots 05/24/2016  2-D echo 05/24/2016  Ultrasound-guided paracentesis - 2.9 L of clear yellow fluid removed--Dr. Earleen Newport 05/22/2016 cytology positive for adenocarcinoma  Ultrasound-guided paracentesis - 4.0 L of clear yellow fluid removed 06/06/2016  CT Abd/Pelvis 06/06/16  Tunneled Peritoneal Drainage Catheter in place and 900 mL of Serous Pleural Fluid removed 06/08/16  NGT Removed 06/09/16   Antimicrobials: IV Ancef 05/24/16 x 1 dose   Diflucan 12/31 -->   IV Cefazolin for Pleurex Cathether  Subjective: Seen and examined at bedside and daughter translated as they refused to have outside translator once again. Patient states she is tolerating diet well and ambulating. Started having bowel movements now and feels better. No N/V/Abdominal pain with diet. No other concerns or complaint at this time.   Objective: Vitals:   06/09/16 1437 06/09/16 2129 06/10/16 [id number]  06/10/16 1324  BP: (!) 145/66 (!) 151/75 113/62 126/69  Pulse: 90 80 77 88  Resp: 16 16 16 16   Temp: 98.8 F (37.1 C) 98.2 F (36.8 C) 98.6 F (37 C) 98.8  F (37.1 C)  TempSrc: Oral Oral Oral Oral  SpO2: 100% 97% 98% 99%  Weight:      Height:        Intake/Output Summary (Last 24 hours) at 06/10/16 1822 Last data filed at 06/10/16 1700  Gross per 24 hour  Intake           3363.5 ml  Output              800 ml  Net           2563.5 ml   Filed Weights   05/20/16 1332 05/20/16 1818 06/05/16 0621  Weight: 78 kg (171 lb 15.3 oz) 80.2 kg (176 lb 12.9 oz) 87.8 kg (193 lb 9 oz)   Examination: Physical Exam:  Constitutional: WN/WD, NAD and appears uncomfortable Eyes: Lids and conjunctivae normal, sclerae anicteric  ENMT: External Ears, Nose appear normal. Grossly normal hearing.  Neck: Appears normal, supple, no cervical masses, normal ROM, no appreciable thyromegaly Respiratory: Clear to auscultation bilaterally, no wheezing, rales, rhonchi or crackles. Normal respiratory effort and patient is not tachypenic. No accessory muscle use.  Cardiovascular: RRR, no murmurs / rubs / gallops. S1 and S2 auscultated. No extremity edema.  Abdomen: Soft, distended with some ascites. Mildly tender to palpate. No masses palpated. No appreciable hepatosplenomegaly. Bowel sounds positive x4 GU: Deferred. Musculoskeletal: No clubbing / cyanosis of digits/nails. No joint deformity upper and lower extremities.  Skin: No rashes, lesions, ulcers. No induration; Warm and dry.  Neurologic: CN 2-12 grossly intact with no focal deficits. Sensation intact in all 4 Extremities, DTR normal.  Psychiatric: Normal judgment and insight. Normal mood and appropriate affect.   Data Reviewed: I have personally reviewed following labs and imaging studies  CBC:  Recent Labs Lab 06/05/16 0443  06/07/16 0529 06/07/16 1917 06/08/16 0423 06/09/16 0421 06/09/16 1951  WBC 8.2  < > 9.7 8.0 7.6 6.9 6.7  NEUTROABS 6.3  --   --   --  6.0 5.7 4.9  HGB 10.0*  < > 9.2* 10.2* 9.6* 10.0* 10.0*  HCT 30.1*  < > 27.4* 30.0* 28.6* 29.9* 30.7*  MCV 86.5  < > 85.9 86.2 86.1 85.9 84.3    PLT 474*  < > 448* 494* 483* 517* 529*  < > = values in this interval not displayed. Basic Metabolic Panel:  Recent Labs Lab 06/04/16 0727 06/05/16 0443 06/06/16 0411 06/07/16 0529 06/08/16 0423 06/09/16 0421 06/09/16 1951 06/10/16 1030  NA 131* 131* 132* 131* 131* 134* 131*  --   K 4.5 4.3 4.6 4.3 4.6 4.2 4.2  --   CL 100* 99* 101 97* 98* 99* 97*  --   CO2 23 25 25 27 27 28 28   --   GLUCOSE 123* 144* 136* 162* 167* 147* 159*  --   BUN 19 22* 22* 16 14 15 14   --   CREATININE 0.34* 0.63 0.68 0.55 0.55 0.60 0.50 0.52  CALCIUM 8.0* 8.0* 8.0* 7.8* 8.1* 8.2* 8.2*  --   MG 1.8 1.8  --   --  1.7 1.7  --  1.8  PHOS 3.4 3.7  --   --  3.6 3.4  --  3.4   GFR: Estimated Creatinine Clearance: 63.9 mL/min (by C-G formula based on SCr of  0.52 mg/dL). Liver Function Tests:  Recent Labs Lab 06/04/16 0727 06/05/16 0443 06/08/16 0423 06/09/16 0421 06/09/16 1951  AST 14* 14* 16 23 22   ALT 8* 9* 13* 18 20  ALKPHOS 78 85 111 112 131*  BILITOT 0.5 0.3 0.3 0.3 0.3  PROT 5.2* 5.0* 5.3* 5.3* 6.1*  ALBUMIN 2.2* 2.2* 2.0* 2.1* 2.2*   No results for input(s): LIPASE, AMYLASE in the last 168 hours. No results for input(s): AMMONIA in the last 168 hours. Coagulation Profile:  Recent Labs Lab 06/08/16 0423  INR 1.02   Cardiac Enzymes: No results for input(s): CKTOTAL, CKMB, CKMBINDEX, TROPONINI in the last 168 hours. BNP (last 3 results) No results for input(s): PROBNP in the last 8760 hours. HbA1C: No results for input(s): HGBA1C in the last 72 hours. CBG:  Recent Labs Lab 06/06/16 0818 06/06/16 1204 06/06/16 1834 06/07/16 0028 06/07/16 0648  GLUCAP 127* 124* 118* 119* 137*   Lipid Profile: No results for input(s): CHOL, HDL, LDLCALC, TRIG, CHOLHDL, LDLDIRECT in the last 72 hours. Thyroid Function Tests: No results for input(s): TSH, T4TOTAL, FREET4, T3FREE, THYROIDAB in the last 72 hours. Anemia Panel: No results for input(s): VITAMINB12, FOLATE, FERRITIN, TIBC, IRON,  RETICCTPCT in the last 72 hours. Sepsis Labs:  Recent Labs Lab 06/06/16 1726 06/06/16 2007 06/08/16 0423 06/10/16 1030  PROCALCITON 0.16  --  0.13 0.15  LATICACIDVEN 1.1 1.2  --   --     Recent Results (from the past 240 hour(s))  Culture, blood (routine x 2)     Status: None (Preliminary result)   Collection Time: 06/06/16  5:09 PM  Result Value Ref Range Status   Specimen Description BLOOD RIGHT HAND  Final   Special Requests IN PEDIATRIC BOTTLE 3CC  Final   Culture   Final    NO GROWTH 4 DAYS Performed at [facility]    Report Status PENDING  Incomplete  Culture, blood (routine x 2)     Status: None (Preliminary result)   Collection Time: 06/06/16  5:20 PM  Result Value Ref Range Status   Specimen Description BLOOD LEFT ARM  Final   Special Requests BOTTLES DRAWN AEROBIC AND ANAEROBIC [place]  Final   Culture   Final    NO GROWTH 4 DAYS Performed at [facility]    Report Status PENDING  Incomplete  Culture, Urine     Status: Abnormal   Collection Time: 06/07/16 12:32 AM  Result Value Ref Range Status   Specimen Description URINE, RANDOM  Final   Special Requests NONE  Final   Culture MULTIPLE SPECIES PRESENT, SUGGEST RECOLLECTION (A)  Final   Report Status 06/08/2016 FINAL  Final    Radiology Studies: No results found. Scheduled Meds: . enoxaparin (LOVENOX) injection  40 mg Subcutaneous Q24H  . pantoprazole (PROTONIX) IV  40 mg Intravenous Q24H  . sucralfate  1 g Oral TID WC & HS   Continuous Infusions: . sodium chloride 10 mL (06/09/16 0521)  . Marland KitchenTPN (CLINIMIX-E) Adult 50 mL/hr at 06/10/16 1732   And  . fat emulsion 120 mL (06/10/16 1731)  . octreotide  (SANDOSTATIN)    IV infusion 10 mcg/hr (06/10/16 1321)    LOS: 20 days    Kerney Elbe, DO [facility] Pager [phone]  If 7PM-7AM, please contact night-coverage [email or web address] Password [facility] 06/10/2016, 6:22 PM

## 2016-06-10 NOTE — Progress Notes (Addendum)
PHARMACY - ADULT TOTAL PARENTERAL NUTRITION CONSULT NOTE   Pharmacy Consult for TPN Indication: partial small bowel obstruction  Patient Measurements: Height: 5\' 3"  (160 cm) Weight: 193 lb 9 oz (87.8 kg) IBW/kg (Calculated) : 52.4 TPN AdjBW (KG): 58.8 Body mass index is 34.29 kg/m.  Insulin Requirements: none  Current Nutrition: CLD; TPN at goal rate  IVF: NS at 10 ml/hr KVO  Central access: implanted port TPN start date: 12/27  ASSESSMENT                                                                                                          HPI: 2 yoF with h/o metastatic ovarian cancer with extensive peritoneal disease with omental thickening with large malignant ascites and partial SBO likely 2/2 metastatic disease. Per MD note, has been 12 days since she has been able to take anything PO. She has NGT to LIWS and has failed multiple attempts to d/c NG and advance diet.  TPN to start 12/27.  Significant events:  12/26 D1C1 Doxil 40mg /m2 - tolerated well 12/27 TPN started 12/28 May allow small sips of fluids 1/2: 4L paracentesis 1/3: trial of octreotide infusion 1/4: pleurx catheter placed for management of continued ascites and distention; 900 mL ascitic fluid removed 1/5: NGT removed, tolerating CLD, considering advancement to FLD 1/6: Spoke with Dr. Alfredia Ferguson, agreed to begin weaning TPN today with the goal of stopping tomorrow as long as she continues to tolerate oral diet.  Today, 06/10/2016:   Glucose - no h/o DM, CBGs were controlled with minimal SSI use; CBG checks and SSI stopped 1/3 for patient comfort  Electrolytes - Na and Cl remain slightly low but stable (1/5)  Renal - SCr WNL, CrCl 60 ml/min (1/5)  LFTs - low, stable (1/5)  TGs - wnl (1/1)  Prealbumin - <5 (12/28), <5 (1/1)  NUTRITIONAL GOALS                                                                                             RD recs (12/26): 1750-1950 kcal, 75-85g protein, 1.7-1.9L/day  fluid Clinimix E 5/20 at a goal rate of 70 ml/hr + 20% fat emulsion at 10 ml/hr to provide: 84 g/day protein, 1958 Kcal/day.  (+++There is currently a Psychologist, prison and probation services of Clinimix solution.  Pharmacy will use Clinimix product based on what we have available in stock+++)  PLAN  At 1800 today:  Decrease Clinimix E 5/20 to 50 ml/hr with plan to stop after this bag is complete tomorrow at 18:00 as long as patient continues to tolerate diet  Decrease 20% fat emulsion to 5 ml/hr.  TPN to contain standard multivitamins and trace elements.  [facility] IVF at [facility]  TPN lab panels on Mondays & Thursdays  F/u diet advancement, ability to wean TPN   Peggyann Juba, PharmD, BCPS Pager: [phone] 06/10/2016 7:31 AM

## 2016-06-11 DIAGNOSIS — D649 Anemia, unspecified: Secondary | ICD-10-CM

## 2016-06-11 DIAGNOSIS — R112 Nausea with vomiting, unspecified: Secondary | ICD-10-CM

## 2016-06-11 LAB — COMPREHENSIVE METABOLIC PANEL
ALK PHOS: 123 U/L (ref 38–126)
ALT: 20 U/L (ref 14–54)
ANION GAP: 8 (ref 5–15)
AST: 19 U/L (ref 15–41)
Albumin: 2.2 g/dL — ABNORMAL LOW (ref 3.5–5.0)
BILIRUBIN TOTAL: 0.3 mg/dL (ref 0.3–1.2)
BUN: 14 mg/dL (ref 6–20)
CALCIUM: 8 mg/dL — AB (ref 8.9–10.3)
CO2: 25 mmol/L (ref 22–32)
Chloride: 98 mmol/L — ABNORMAL LOW (ref 101–111)
Creatinine, Ser: 0.6 mg/dL (ref 0.44–1.00)
GFR calc non Af Amer: >60 mL/min (ref >60)
GLUCOSE: 148 mg/dL — AB (ref 65–99)
Potassium: 4.4 mmol/L (ref 3.5–5.1)
Sodium: 131 mmol/L — ABNORMAL LOW (ref 135–145)
TOTAL PROTEIN: 5.3 g/dL — AB (ref 6.5–8.1)

## 2016-06-11 LAB — CBC WITH DIFFERENTIAL/PLATELET
Basophils Absolute: 0 10*3/uL (ref 0.0–0.1)
Basophils Relative: 1 %
EOS PCT: 2 %
Eosinophils Absolute: 0.1 10*3/uL (ref 0.0–0.7)
HCT: 28.8 % — ABNORMAL LOW (ref 36.0–46.0)
Hemoglobin: 9.3 g/dL — ABNORMAL LOW (ref 12.0–15.0)
LYMPHS ABS: 1.3 10*3/uL (ref 0.7–4.0)
LYMPHS PCT: 24 %
MCH: 27.3 pg (ref 26.0–34.0)
MCHC: 32.3 g/dL (ref 30.0–36.0)
MCV: 84.5 fL (ref 78.0–100.0)
MONO ABS: 0.4 10*3/uL (ref 0.1–1.0)
Monocytes Relative: 7 %
Neutro Abs: 3.6 10*3/uL (ref 1.7–7.7)
Neutrophils Relative %: 66 %
PLATELETS: 497 10*3/uL — AB (ref 150–400)
RBC: 3.41 MIL/uL — ABNORMAL LOW (ref 3.87–5.11)
RDW: 12.6 % (ref 11.5–15.5)
WBC: 5.3 10*3/uL (ref 4.0–10.5)

## 2016-06-11 LAB — CULTURE, BLOOD (ROUTINE X 2)
Culture: NO GROWTH
Culture: NO GROWTH

## 2016-06-11 LAB — PHOSPHORUS: Phosphorus: 3.3 mg/dL (ref 2.5–4.6)

## 2016-06-11 LAB — MAGNESIUM: Magnesium: 1.8 mg/dL (ref 1.7–2.4)

## 2016-06-11 MED ORDER — BOOST / RESOURCE BREEZE PO LIQD
1.0000 | Freq: Four times a day (QID) | ORAL | Status: DC
  Administered 2016-06-11 – 2016-06-13 (×8): 1 via ORAL

## 2016-06-11 NOTE — Progress Notes (Signed)
PROGRESS NOTE    Audrey Garza  [id number] DOB: May 17, 1941 DOA: 05/20/2016 PCP: No primary care provider on file.  Brief Narrative:  76 year old female with a history of metastatic ovarian cancer diagnosed in January 2017 (s/p primary debulking surgery followed by 8 adjuvant cycles of carboplatin and paclitaxel and 4 additional cycles of carboplatin, paclitaxel, avastin), last chemo Sept 2017, now presented with progressive abdominal bloating and pain for 3 weeks duration. She was feeling well until the first week of December, 2017 when she was sick with a virus from which she never recovered. She states that she began feeling distended in the abdomen and uncomfortable and was taken to the ED on 05/20/16.  CT chest/abd/pelvis9-21-17 reportedly with no evidence of malignancy, with some bullous emphysema, tortuous sigmoid colon with diverticulosis. . Recommendation was for maintenance avastin x 1 year, however patient then came to [facility] in 02-2016. She has not established care with any physician since coming to [place]. CT AP 05-20-16 showed large volume ascites, extensive peritoneal disease with omental thickening, trace bilateral pleural effusions, left colonic diverticulosis, bilateral inguinal hernias containing fat, post hysterectomy, no adnexal masses, no adenopathy. US paracentesis 05-22-16 revealed 2.9 liters of yellow fluid. 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 Unfortunately, after paracentesis she began having increasing abdominal pain. Abdominal x-ray revealed distended small bowel loops concerning for small bowel obstruction. Gen. surgery was consulted. NG was inserted to suction. She is s/p repeat Paracentesis 06-06-16 which removed 4 liters. Today she went down for Pleux of abdomen with 900 mL removed. Patient was transitioned to Clear Liquid Diet and NGT was removed. Diet was advanced to Full Liquid Diet and Patient was having bowel movements now. Patient with  Dr. Marko Plume this AM who prefers keeping the patient at a liquid diet for now and slowly gradually increase as an outpatient if improves with chemotherapy.   Assessment & Plan:   Active Problems:   Malignant ascites   Malnutrition of moderate degree   Carcinoma of ovary (HCC)   SBO (small bowel obstruction)   Gastroesophageal reflux disease   N&V (nausea and vomiting)   Impaired nasal gastric feeding tube   Ileus (HCC)   Abdominal distention   Encounter for nasogastric (NG) tube placement   Thrush of mouth and esophagus (HCC)   Acute esophagitis   Peritoneal carcinomatosis ([place])   Palliative care encounter   Intractable vomiting with nausea  Metastatic ovarian cancer with extensive peritoneal disease with omental thickening and peritoneal carcinomatosis with large ascites s/p Pleurx Catheter Drain - Appreciate Drs. Livesay and Rossi's recommendations. Dr. Nancy Marus of [place].  - Progressive within 4 months of last systemic treatment, she has been taxol + avastin 02-02-16; Received Doxil during hospitalization on 05/30/16 - rapidly symptomatic with malignant ascites and peritoneal carcinomatosis.  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 Denman George, pt is not a candidate for interval debulking surgery due to the ? platinum resistant nature of her disease; Dr. Alycia Rossetti re-evaluated per Dr. Mariana Kaufman recommendatons - if pt fails advancement of diet, she should be considered for palliative G tube (Discussed with surgery and they  signed off). Consulted IR for G tube placement and recommend Ascites to be  controlled/resolved prior to Gastrostomy tube placement.  - Unclear patient's prognosis and response to recent doxil - symptomatic management with pain control  - palliative care team consulted for assistance - Started on Octreotide by Dr. Domingo Cocking and agree; Octreotide dose reduced by Palliative Care Medicine.  -Dr. Alycia Rossetti consulted and recommended Plerux Cathether drain which was placed by IR  -Pluerx Cath in place and had 900 mL removed when it was inserted; Had 175 mL drained yesterday -Diet advanced to Full Liquids and patient tolerating; Per Dr. Jeanette Caprice say prefer to keep diet on clear liquid and continue on [employer] TPA weaned off and to be discontinued today.   Small Bowel Obstruction, improving - 05/23/2016 abdominal x-ray multiple dilated loops of bowel concerning for small bowel obstruction - Likely due to the extensive peritoneal carcinomatosis - recurrent SBO, if re-occurs, she might need G tube as she is not a candidate for bowel Surgery per Gyn Onc and General Surgery.  - PCT consulted for further [place] discussion  - IR consulted and Tunneled Plurex Cather in place for ascites - Octreotide per Palliative Care; Dose Reduced by Dr. Domingo Cocking -Tolerated Clears and Advanced to Full Liquid Diet yesterday; Continue Full Liquids per Dr. Marko Plume and titrate off TPN and Discontinue -Avoid Milk Products   Mild Hyponatremia - Secondary to volume depletion vs. Hyponatremia from Asicites; Na+ is 131 - BMP in AM  Mild Thrombocytosis - likely reactive; Platelet went from 448 -> 483 -> 517 -> 529 -> 497  Protein Calorie Malnutrition - in the context of acute on chronic illness - nutritionist assistance appreciated; C/w Resource Breeze Supplements    Anemia of chronic disease, malignancy - Hg overall stable  - Hb/Hct went from [phone] -> 9.2/27.4 -> 9.6/28.6 -> 10.0/29.9 -> 10.0/30.7 -> 9.3/28.8  Oral Thrush - Diflucan Discontinued   DVT prophylaxis: Lovenox 40 mg  sq Code Status: FULL CODE Family Communication: Daughter present at bedside Disposition Plan: SNF at D/C when Stable  Consultants:   Gyn Oncology Dr. Denman George and Dr. Alycia Rossetti  General Surgery  Oncology Dr. Marko Plume  Palliative Care Medicine  Interventional Radiology   Procedures: CT abdomen and pelvis 05/20/2016  Abdominal x-rays 05/23/2016, 05/24/2016, 05/25/2016, 05/27/2016, 05/28/2016, 05/30/2016, 05/28/2016, 05/29/2016  Right IJ single-lumen power port catheter insertion per Dr. Annamaria Boots 05/24/2016  2-D echo 05/24/2016  Ultrasound-guided paracentesis - 2.9 L of clear yellow fluid removed--Dr. Earleen Newport 05/22/2016 cytology positive for adenocarcinoma  Ultrasound-guided paracentesis - 4.0 L of clear yellow fluid removed 06/06/2016  CT Abd/Pelvis 06/06/16  Tunneled Peritoneal Drainage Catheter in place and 900 mL of Serous Pleural Fluid removed 06/08/16  NGT Removed 06/09/16  Peritoneal Catheter Drainage 175 mL on 06/10/16   Antimicrobials: IV Ancef 05/24/16 x 1 dose   Diflucan 06/04/16 --> 06/10/16  IV Cefazolin for Pleurex Cathether on 06/08/16  Subjective: Seen and examined at bedside and granddaughter translated as they refused to have outside translator once again. Patient states she was belching more and walking the hallways without problems. Had no bowel movements today. No other concerns or complaints and states abdomen was not tender or hurting but had fluid drained yesterday from peritoneal catheter.   Objective: Vitals:   06/10/16 1324 06/10/16 2105 06/11/16 0538 06/11/16 1334  BP: 126/69 132/61 119/69 131/72  Pulse: 88 80 79 79  Resp: 16 16 16 16   Temp: 98.8 F (37.1 C) 99.3 F (37.4 C) 98.8 F (37.1 C) 98.3  F (36.8 C)  TempSrc: Oral Oral Oral Oral  SpO2: 99% 96% 95% 100%  Weight:      Height:        Intake/Output Summary (Last 24 hours) at 06/11/16 2045 Last data filed at 06/11/16 1600  Gross per 24 hour  Intake          1696.25 ml  Output                0  ml  Net          1696.25 ml   Filed Weights   05/20/16 1332 05/20/16 1818 06/05/16 0621  Weight: 78 kg (171 lb 15.3 oz) 80.2 kg (176 lb 12.9 oz) 87.8 kg (193 lb 9 oz)   Examination: Physical Exam:  Constitutional: WN/WD, NAD and appears uncomfortable Eyes: Lids and conjunctivae normal, sclerae anicteric  ENMT: External Ears, Nose appear normal. Grossly normal hearing.  Neck: Appears normal, supple, no cervical masses, normal ROM, no appreciable thyromegaly Respiratory: Clear to auscultation bilaterally, no wheezing, rales, rhonchi or crackles. Normal respiratory effort and patient is not tachypenic. No accessory muscle use.  Cardiovascular: RRR, no murmurs / rubs / gallops. S1 and S2 auscultated. No extremity edema.  Abdomen: Soft, distended with some ascites. Mildly tender to palpate. No masses palpated. No appreciable hepatosplenomegaly. Bowel sounds positive x4 GU: Deferred. Musculoskeletal: No clubbing / cyanosis of digits/nails. No joint deformity upper and lower extremities.  Skin: No rashes, lesions, ulcers. No induration; Warm and dry.  Neurologic: CN 2-12 grossly intact with no focal deficits. Sensation intact in all 4 Extremities, DTR normal.  Psychiatric: Normal judgment and insight. Normal mood and appropriate affect.   Data Reviewed: I have personally reviewed following labs and imaging studies  CBC:  Recent Labs Lab 06/05/16 0443  06/07/16 1917 06/08/16 0423 06/09/16 0421 06/09/16 1951 06/11/16 0423  WBC 8.2  < > 8.0 7.6 6.9 6.7 5.3  NEUTROABS 6.3  --   --  6.0 5.7 4.9 3.6  HGB 10.0*  < > 10.2* 9.6* 10.0* 10.0* 9.3*  HCT 30.1*  < > 30.0* 28.6* 29.9* 30.7* 28.8*  MCV 86.5  < > 86.2 86.1 85.9 84.3 84.5  PLT 474*  < > 494* 483* 517* 529* 497*  < > = values in this interval not displayed. Basic Metabolic Panel:  Recent Labs Lab 06/05/16 0443  06/07/16 0529 06/08/16 0423 06/09/16 0421 06/09/16 1951 06/10/16 1030 06/11/16 0423  NA 131*  < > 131* 131* 134*  131*  --  131*  K 4.3  < > 4.3 4.6 4.2 4.2  --  4.4  CL 99*  < > 97* 98* 99* 97*  --  98*  CO2 25  < > 27 27 28 28   --  25  GLUCOSE 144*  < > 162* 167* 147* 159*  --  148*  BUN 22*  < > 16 14 15 14   --  14  CREATININE 0.63  < > 0.55 0.55 0.60 0.50 0.52 0.60  CALCIUM 8.0*  < > 7.8* 8.1* 8.2* 8.2*  --  8.0*  MG 1.8  --   --  1.7 1.7  --  1.8 1.8  PHOS 3.7  --   --  3.6 3.4  --  3.4 3.3  < > = values in this interval not displayed. GFR: Estimated Creatinine Clearance: 63.9 mL/min (by C-G formula based on SCr of 0.6 mg/dL). Liver Function Tests:  Recent Labs Lab 06/05/16 [id number] 06/08/16 0423 06/09/16 0421 06/09/16 1951  06/11/16 0423  AST 14* 16 23 22 19   ALT 9* 13* 18 20 20   ALKPHOS 85 111 112 131* 123  BILITOT 0.3 0.3 0.3 0.3 0.3  PROT 5.0* 5.3* 5.3* 6.1* 5.3*  ALBUMIN 2.2* 2.0* 2.1* 2.2* 2.2*   No results for input(s): LIPASE, AMYLASE in the last 168 hours. No results for input(s): AMMONIA in the last 168 hours. Coagulation Profile:  Recent Labs Lab 06/08/16 0423  INR 1.02   Cardiac Enzymes: No results for input(s): CKTOTAL, CKMB, CKMBINDEX, TROPONINI in the last 168 hours. BNP (last 3 results) No results for input(s): PROBNP in the last 8760 hours. HbA1C: No results for input(s): HGBA1C in the last 72 hours. CBG:  Recent Labs Lab 06/06/16 0818 06/06/16 1204 06/06/16 1834 06/07/16 0028 06/07/16 0648  GLUCAP 127* 124* 118* 119* 137*   Lipid Profile: No results for input(s): CHOL, HDL, LDLCALC, TRIG, CHOLHDL, LDLDIRECT in the last 72 hours. Thyroid Function Tests: No results for input(s): TSH, T4TOTAL, FREET4, T3FREE, THYROIDAB in the last 72 hours. Anemia Panel: No results for input(s): VITAMINB12, FOLATE, FERRITIN, TIBC, IRON, RETICCTPCT in the last 72 hours. Sepsis Labs:  Recent Labs Lab 06/06/16 1726 06/06/16 2007 06/08/16 0423 06/10/16 1030  PROCALCITON 0.16  --  0.13 0.15  LATICACIDVEN 1.1 1.2  --   --     Recent Results (from the past 240  hour(s))  Culture, blood (routine x 2)     Status: None   Collection Time: 06/06/16  5:09 PM  Result Value Ref Range Status   Specimen Description BLOOD RIGHT HAND  Final   Special Requests IN PEDIATRIC BOTTLE 3CC  Final   Culture   Final    NO GROWTH 5 DAYS Performed at [facility]    Report Status 06/11/2016 FINAL  Final  Culture, blood (routine x 2)     Status: None   Collection Time: 06/06/16  5:20 PM  Result Value Ref Range Status   Specimen Description BLOOD LEFT ARM  Final   Special Requests BOTTLES DRAWN AEROBIC AND ANAEROBIC [place]  Final   Culture   Final    NO GROWTH 5 DAYS Performed at [facility]    Report Status 06/11/2016 FINAL  Final  Culture, Urine     Status: Abnormal   Collection Time: 06/07/16 12:32 AM  Result Value Ref Range Status   Specimen Description URINE, RANDOM  Final   Special Requests NONE  Final   Culture MULTIPLE SPECIES PRESENT, SUGGEST RECOLLECTION (A)  Final   Report Status 06/08/2016 FINAL  Final    Radiology Studies: No results found. Scheduled Meds: . enoxaparin (LOVENOX) injection  40 mg Subcutaneous Q24H  . feeding supplement  1 Container Oral QID  . pantoprazole (PROTONIX) IV  40 mg Intravenous Q24H   Continuous Infusions: . sodium chloride 250 mL (06/11/16 1249)  . octreotide  (SANDOSTATIN)    IV infusion 5 mcg/hr (06/11/16 1350)    LOS: 21 days    Kerney Elbe, DO [facility] Pager [phone]  If 7PM-7AM, please contact night-coverage [email or web address] Password [email or web address] 06/11/2016, 8:45 PM

## 2016-06-11 NOTE — Progress Notes (Signed)
Daily Progress Note   Patient Name: Audrey Garza       Date: 06/11/2016 DOB: August 02, 1940  Age: 76 y.o. MRN#: [id number] Attending Physician: Kerney Elbe, DO Primary Care Physician: No primary care provider on file. Admit Date: 05/20/2016  Reason for Consultation/Follow-up: Establishing goals of care, Non pain symptom management and Pain control  Subjective: I reviewed her overall condition with patient and her granddaughter, including obstruction, plan to continue to with clear diet, decreasing/stopping octreotide, and potential she may still require venting g tube if she is not able to be weaned from octreotide.    Reviewed with patient to let us know if she becomes sick after decreasing infusion rate so that is can be increased again rather than reinsert NG.  We also discussed that nutrition is going to continue to be a concern moving forward and about trying to get in 4 Breeze supplements daily.  Length of Stay: 21  Current Medications: Scheduled Meds:  . enoxaparin (LOVENOX) injection  40 mg Subcutaneous Q24H  . feeding supplement  1 Container Oral QID  . pantoprazole (PROTONIX) IV  40 mg Intravenous Q24H    Continuous Infusions: . sodium chloride 250 mL (06/11/16 1249)  . Marland KitchenTPN (CLINIMIX-E) Adult 50 mL/hr at 06/10/16 1732   And  . fat emulsion 120 mL (06/10/16 1731)  . octreotide  (SANDOSTATIN)    IV infusion 10 mcg/hr (06/10/16 1321)    PRN Meds: acetaminophen (TYLENOL) oral liquid 160 mg/5 mL, albuterol, alteplase, alum & mag hydroxide-simeth, butamben-tetracaine-benzocaine, heparin lock flush, heparin lock flush, hydrALAZINE, HYDROmorphone (DILAUDID) injection, lidocaine, LORazepam, menthol-cetylpyridinium, ondansetron (ZOFRAN) IV, oxyCODONE-acetaminophen, phenol,  promethazine, simethicone, sodium chloride flush, sodium chloride flush, sodium chloride flush, zolpidem  Physical Exam        General: Alert, awake, in no acute distress.  Sitting in bedside chair HEENT: No bruits, no goiter, no JVD Heart: Regular rate and rhythm. No murmur appreciated. Lungs: Good air movement, clear Abdomen: Soft, nontender, mild distended, pleurex in place, positive bowel sounds.  Ext: No significant edema Skin: Warm and dry Neuro: Grossly intact, nonfocal.   Vital Signs: BP 131/72 (BP Location: Right Arm)   Pulse 79   Temp 98.3 F (36.8 C) (Oral)   Resp 16   Ht 5\' 3"  (1.6 m)   Wt 87.8 kg (193 lb 9 oz)  SpO2 100%   BMI 34.29 kg/m  SpO2: SpO2: 100 % O2 Device: O2 Device: Not Delivered O2 Flow Rate: O2 Flow Rate (L/min): 2 L/min  Intake/output summary:   Intake/Output Summary (Last 24 hours) at 06/11/16 1426 Last data filed at 06/11/16 1300  Gross per 24 hour  Intake              865 ml  Output                0 ml  Net              865 ml   LBM: Last BM Date: 06/10/16 Baseline Weight: Weight: 78 kg (171 lb 15.3 oz) Most recent weight: Weight: 87.8 kg (193 lb 9 oz)       Palliative Assessment/Data:    Flowsheet Rows   Flowsheet Row Most Recent Value  Intake Tab  Referral Department  Hospitalist  Unit at Time of Referral  Oncology Unit  Palliative Care Primary Diagnosis  Cancer  Date Notified  06/05/16  Palliative Care Type  New Palliative care  Reason for referral  Clarify Goals of Care, Non-pain Symptom, Pain  Date of Admission  05/20/16  Date first seen by Palliative Care  06/06/16  # of days Palliative referral response time  1 Day(s)  # of days IP prior to Palliative referral  16  Clinical Assessment  Palliative Performance Scale Score  40%  Pain Max last 24 hours  9  Pain Min Last 24 hours  4  Psychosocial & Spiritual Assessment  Palliative Care Outcomes  Patient/Family meeting held?  Yes  Who was at the meeting?  Patient, 2  daughters, son in law, grandson      Patient Active Problem List   Diagnosis Date Noted  . Palliative care encounter   . Acute esophagitis   . Peritoneal carcinomatosis ([place])   . Thrush of mouth and esophagus ([place])   . Abdominal distention   . Encounter for nasogastric (NG) tube placement   . Ileus ([place])   . Impaired nasal gastric feeding tube   . Gastroesophageal reflux disease   . N&V (nausea and vomiting)   . SBO (small bowel obstruction) 05/24/2016  . Malnutrition of moderate degree 05/23/2016  . Malignant neoplasm of ovary ([place])   . 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 female  with past medical history of ovarian cancer admitted on 05/20/2016 with bowel obstruction.  Palliative consulted for nausea related to obstruction.   Recommendations/Plan: - Nausea: NG out and patient tolerating clear liquids.  Will decrease octreotide again today to 13mcg/hr.  Plan to stop tomorrow or when this bag is empty.  I would not hang another bag.  If she becomes sick again after decreasing or stopping pctreotide, would restart octreotide to see is she feels better before attempting to reinsert NG.  If she cannot be weaned from octreotide, she will likely need venting G-tube.    Code Status:    Code Status Orders        Start     Ordered   05/20/16 1810  Full code  Continuous     05/20/16 1809    Code Status History    Date Active Date Inactive Code Status Order ID Comments User Context   This patient has a current code status but no historical code status.       Prognosis:   < 6 months  Discharge  Planning:  To Be Determined  Care plan was discussed with patient, granddaughter, RN, Dr. Alfredia Ferguson  Thank you for allowing the Palliative Medicine Team to assist in the care of this patient.   Total Time 30 Prolonged Time Billed No      Greater than 50%  of this time was spent counseling and coordinating care related to  the above assessment and plan.  Micheline Rough, MD  Please contact Palliative Medicine Team phone at [phone] for questions and concerns.

## 2016-06-11 NOTE — Progress Notes (Signed)
MEDICAL ONCOLOGY June 11, 2016, 10:26 AM  Hospital day 23 Antibiotics: none. Diflucan completed Chemotherapy: day 13 cycle 1 doxil TNA since 05-31-16, tapering per pharmacy Octreotide infusion  No physicians established outpatient PTA.  EMR reviewed. Patient seen, together with adult granddaughter  Subjective: Feeling better. Throat and cough improved since NG out. No nausea except when takes liquid for cough. Significant belching gas, is passing flatus, had BM after MD visit today as she has had daily for ~ 3-4 days. Tolerating clears and has had full liquids, THO NOTE LACTOSE INTOLERANT; has not tried the [employer] that she liked previously. Feels stronger, able to do some exercising and walking in room today, tolerating up in chair. No pain requiring pain medication. Is able to sleep with medication, reports chronic insomnia x years. No SOB with present activity level. Peritoneal drain not uncomfortable, drained only 175 cc on 06-10-16. Voiding every 1.5 hrs per patient.  As still high risk for bowel obstruction, I would prefer keeping diet at liquid for now, including when ready for DC home. As abdominal gas is very uncomfortable for her now, and wiith known lactose intolerance, should have no milk products, this discussed with unit RN and family member now. Can increase gradually outpatient depending if situation improves further with chemo. I have asked her to try to get in 3-4 [employer] daily. Needs as much protein as possible due to losses from malignant ascites.   Oncologic History Patient had hysterectomy without oophorectomy in her 44s for benign indication, and been in her usual good health until July 2016, when she developed abdominal discomfort and bloating. In late 03-2015 she was found to have large pelvic mass and perihepatic ascites. CA 125 then was 359, with CEA of 3, CA 19-9 5.8 and alpha fetoprotein 2.3. She had exploratory laparotomy 04-19-15, which was not done by  gyn oncologist, with bilateral ovaried an tumors, 10 cm with capsule ruptured on right , smaller on left, peritoneal carcinomatosis. Washings were done, however accompanying information does not describe extent of debulking. She had some bowel obstruction post operatively, with NG used ~ POD 3. Pathology available 05-05-15 showed cystic and solid left ovarian mass, 13 cm right ovarian mass, moderately differentiated tumor "tumor de celulas de la granulosa". 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 CT AP 122016 had ascites, carcinomatosis and multiple peritoneal implants. CA 125 on 05-24-15 baseline for chemo was 141. She received 3 cycles of taxol + carboplatin from 05-26-15 thru 07-07-15 (not clear if avastin given those cycles), after which carboplatin was not available in [place]. She received taxol 175 mg/m2, CDDP and avastin for 3 additional cycles thru 10-07-15. Second look laparotomy was discussed, patient declined, and instead had 4 cycles avastin + taxol from 12-01-15 thru 02-02-16 (family is confirming, but we believe no CDDP after 10-07-15 and no carboplatin after 07-07-15). CT CAP 02-24-16 reportedly had no evidence of malignancy, with some bullous emphysema, tortuous sigmoid colon with diverticulosis. . Recommendation was for maintenance avastin x 1 year, however patient then came to [facility] in 02-2016. She has not established with any physicians since coming to [place]. CT AP 05-20-16 lias arge volume ascites, extensive peritoneal disease with omental thickening, trace bilateral pleural effusions, left colonic diverticulosis, bilateral inguinal hernias containing fat, post hysterectomy, no adnexal masses, no adenopathy. US paracentesis 05-22-16 for 2.9 liters of yellow fluid. Cytology adenocarcinoma withserous features, immunohistochemical stains confirm gyn. First doxil given 05-30-16, chosen due to  side effect profile  NOTE  MAY STILL BE  PLATINUM SENSITIVE. Tolerated DC of NG 06-09-16.  Objective: Vital signs in last 24 hours: Blood pressure 119/69, pulse 79, temperature 98.8 F (37.1 C), temperature source Oral, resp. rate 16, height 5\' 3"  (1.6 m), weight 193 lb 9 oz (87.8 kg), SpO2 95 %. Up in chair, ambulates to BR. Alert, not in any acute discomfort, looks better today. Somewhat pale, not icteric. Oral mucosa moist, slight coating still on tongue not frank thrush. Lungs with slightly decreased BS right base otherwise clear. Heart RRR. PAC site fine, TNA infusing on taper per pharmacy. Abdomen soft, mildly distended unchanged, a few BS. Peritoneal drain dressing clean and dry. Belching during visit. LE no pitting edema, cords ,tenderness. No skin irritation from doxil. Changes position in chair easily, strength good in extremities.   Intake/Output from previous day: 01/06 0701 - 01/07 0700 In: 1762.5 [P.O.:720; I.V.:992.5; IV Piggyback:50] Out: -  Intake/Output this shift: No intake/output data recorded.   Lab Results:  Recent Labs  06/09/16 1951 06/11/16 0423  WBC 6.7 5.3  HGB 10.0* 9.3*  HCT 30.7* 28.8*  PLT 529* 497*   BMET  Recent Labs  06/09/16 1951 06/10/16 1030 06/11/16 0423  NA 131*  --  131*  K 4.2  --  4.4  CL 97*  --  98*  CO2 28  --  25  GLUCOSE 159*  --  148*  BUN 14  --  14  CREATININE 0.50 0.52 0.60  CALCIUM 8.2*  --  8.0*  full CMET noted.  Studies/Results: No results found.   Assessment/Plan: 1.advanced ovarian carcinoma: IIIC high grade serous, carcinomatosis with large volume ascites developing within 4 months of last chemo (taxol + avastin 02-02-16).  Received first doxil 05-30-16, WBC and platelets maintaining,  oral/ esophageal mucositis improved. Not candidate for surgery including for the bowel obstruction per gyn onc and general surgery, but may not be platinum resistant and may still get some control/ some improvement with systemic treatment. Doxil used every 4 weeks.   Paracentesis 2.9 liters malignant ascites on 05-22-16,  4 liters 06-06-16 and almost 1 liter with peritoneal drain placementon 06-08-16, but minimal ascites (175 ml) on 06-10-16. Last CDDP 10-2015 and last carboplatin 07-07-15 by review of records from [place].  2. Partial small bowel obstruction: tolerating NG out, passing gas and BMs. Needs to strictly avoid milk products to lessen gas. Did improve after starting octreotide infusion, per Dr Domingo Cocking. I would suggest not advancing diet beyond liquids until hopefully additional improvement in carcinomatosis from chemo, but will need to be mainly clears + Resource breeze due to lactose intolerance.  3. Nutrition: TNA begun 12-27 and being tapered now. Encouraged Resource supplement 3-4x daily, will need to continue this at home. I would prefer liquid diet for now, due to high risk for recurrent obstruction. NOTE lactose intolerant, so most full liquids likely to make abdominal gas worse. Needs best protein possible due to losses with malignant ascites.  4.Cough improved, not febrile. Encouraged incentive spirometer, deep breathing also easier since paracentesis.  5.marked diverticulosis by CT, without evidence diverticulitis then.  6.social situation: recently moved from [place] to be with daughters in [place], family very supportive. Patient speaks Spanish. No insurance tho may be medicaid eligible. Will need to know if any insurance options for DC planning.  7..PAC placed by IR 8.deconditioning from present illness: had been walking several miles daily and in very good physical condition prior to admission. Encouraged her to  be up in room and again walk in halls as tolerated 10. Echocardiogram with good EF 05-25-16, for doxil. Aortic atherosclerosis by CT 11. flu vaccine done 05-28-16 12.oral candida, oral and presumed esophageal mucositis from doxil: improved. Off diflucan. Stop carafate liquid. 13. Anemia: likely from chemo, blood draws etc. No bleeding.  Will check iron with labs already planned 06-12-16  Please call or page if I can help between my rounds. Pager [phone] ,cell [place]  Evlyn Clines  MD

## 2016-06-11 NOTE — Progress Notes (Signed)
Pt ambulating around the unit with daughter, tolerating well.

## 2016-06-12 MED ORDER — UNJURY CHICKEN SOUP POWDER
8.0000 [oz_av] | Freq: Two times a day (BID) | ORAL | Status: DC
  Administered 2016-06-12 – 2016-06-13 (×2): 8 [oz_av] via ORAL
  Filled 2016-06-12 (×3): qty 27

## 2016-06-12 NOTE — Progress Notes (Signed)
PROGRESS NOTE    Audrey Garza  [id number] DOB: Mar 04, 1941 DOA: 05/20/2016 PCP: No primary care provider on file.  Brief Narrative:  76 year old female with a history of metastatic ovarian cancer diagnosed in January 2017 (s/p primary debulking surgery followed by 8 adjuvant cycles of carboplatin and paclitaxel and 4 additional cycles of carboplatin, paclitaxel, avastin), last chemo Sept 2017, now presented with progressive abdominal bloating and pain for 3 weeks duration. She was feeling well until the first week of December, 2017 when she was sick with a virus from which she never recovered. She states that she began feeling distended in the abdomen and uncomfortable and was taken to the ED on 05/20/16.  CT chest/abd/pelvis9-21-17 reportedly with no evidence of malignancy, with some bullous emphysema, tortuous sigmoid colon with diverticulosis. . Recommendation was for maintenance avastin x 1 year, however patient then came to [facility] in 02-2016. She has not established care with any physician since coming to [place]. CT AP 05-20-16 showed large volume ascites, extensive peritoneal disease with omental thickening, trace bilateral pleural effusions, left colonic diverticulosis, bilateral inguinal hernias containing fat, post hysterectomy, no adnexal masses, no adenopathy. US paracentesis 05-22-16 revealed 2.9 liters of yellow fluid. 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 Unfortunately, after paracentesis she began having increasing abdominal pain. Abdominal x-ray revealed distended small bowel loops concerning for small bowel obstruction. Gen. surgery was consulted. NG was inserted to suction. She is s/p repeat Paracentesis 06-06-16 which removed 4 liters. Today she went down for Pleux of abdomen with 900 mL removed. Patient was transitioned to Clear Liquid Diet and NGT was removed. Diet was advanced to Full Liquid Diet and Patient was having bowel movements now. Patient with  Dr. Marko Plume this AM who prefers keeping the patient at a liquid diet for now and slowly gradually increase as an outpatient if improves with chemotherapy. Has tolerated Clears and Octreotide decreasing. No Labs were drawn 06/12/16 even though they were ordered.   Assessment & Plan:   Active Problems:   Malignant ascites   Malnutrition of moderate degree   Malignant neoplasm of ovary (HCC)   Partial small bowel obstruction   Gastroesophageal reflux disease   N&V (nausea and vomiting)   Impaired nasal gastric feeding tube   Ileus (HCC)   Abdominal distention   Encounter for nasogastric (NG) tube placement   Thrush of mouth and esophagus (HCC)   Acute esophagitis   Peritoneal carcinomatosis ([place])   Palliative care encounter   Intractable vomiting with nausea  Metastatic ovarian cancer with extensive peritoneal disease with omental thickening and peritoneal carcinomatosis with large ascites s/p Pleurx Catheter Drain - Appreciate Drs. Livesay and Rossi's recommendations. Dr. Nancy Marus of [place].  - Progressive within 4 months of last systemic treatment, she has been taxol + avastin 02-02-16; Received Doxil during hospitalization on 05/30/16 - rapidly symptomatic with malignant ascites and peritoneal carcinomatosis.  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 Denman George, pt is not a candidate for interval debulking surgery due to the ? platinum resistant nature of her disease; Dr. Alycia Rossetti re-evaluated per Dr. Mariana Kaufman recommendatons - if pt  fails advancement of diet, she should be considered for palliative G  tube (Discussed with surgery and they signed off). Consulted IR for G tube placement and recommend Ascites to be controlled/resolved prior to Gastrostomy tube placement.  - Unclear patient's prognosis and response to recent doxil - symptomatic management with pain control  - palliative care team consulted for assistance - Started on Octreotide by Dr. Domingo Cocking and agree; Octreotide dose reduced by Palliative Care Medicine.  -Dr. Alycia Rossetti consulted and recommended Plerux Cathether drain which was placed by IR  -Pluerx Cath in place and had 900 mL removed when it was inserted; Had 175 mL drained yesterday -Diet advanced to Full Liquids and patient tolerating; Per Dr. Jeanette Caprice say prefer to keep diet on clear liquid and continue on [employer] TPA Discontinued Yesterday  Small Bowel Obstruction, improving - 05/23/2016 abdominal x-ray multiple dilated loops of bowel concerning for small bowel obstruction - Likely due to the extensive peritoneal carcinomatosis - recurrent SBO, if re-occurs, she might need G tube as she is not a candidate for bowel Surgery per Gyn Onc and General Surgery.  - PCT consulted for further [place] discussion  - IR consulted and Tunneled Plurex Cather in place for ascites - Octreotide per Palliative Care; Dose Reduced by Dr. Domingo Cocking -> to be D/C'd this evening.  -Tolerated Clears and Advanced to Full Liquid Diet yesterday; Continue Full Liquids per Dr. Marko Plume and titrate off TPN and Discontinue -Avoid Milk Products   Mild Hyponatremia - Secondary to volume depletion vs. Hyponatremia from Asicites; Na+ was 131 - BMP in AM  Mild Thrombocytosis - likely reactive; Platelet went from 448 -> 483 -> 517 -> 529 -> 497  Protein Calorie Malnutrition - in the context of acute on chronic illness - nutritionist assistance appreciated; C/w Resource Breeze Supplements    Anemia of chronic disease, malignancy - Hg overall stable  - Hb/Hct went from [phone] -> 9.2/27.4 ->  9.6/28.6 -> 10.0/29.9 -> 10.0/30.7 -> 9.3/28.8  Oral Thrush - Diflucan Discontinued   DVT prophylaxis: Lovenox 40 mg sq Code Status: FULL CODE Family Communication: Daughter present at bedside Disposition Plan: SNF at D/C when Stable  Consultants:   Gyn Oncology Dr. Denman George and Dr. Alycia Rossetti  General Surgery  Oncology Dr. Marko Plume  Palliative Care Medicine  Interventional Radiology   Procedures: CT abdomen and pelvis 05/20/2016  Abdominal x-rays 05/23/2016, 05/24/2016, 05/25/2016, 05/27/2016, 05/28/2016, 05/30/2016, 05/28/2016, 05/29/2016  Right IJ single-lumen power port catheter insertion per Dr. Annamaria Boots 05/24/2016  2-D echo 05/24/2016  Ultrasound-guided paracentesis - 2.9 L of clear yellow fluid removed--Dr. Earleen Newport 05/22/2016 cytology positive for adenocarcinoma  Ultrasound-guided paracentesis - 4.0 L of clear yellow fluid removed 06/06/2016  CT Abd/Pelvis 06/06/16  Tunneled Peritoneal Drainage Catheter in place and 900 mL of Serous Pleural Fluid removed 06/08/16  NGT Removed 06/09/16  Peritoneal Catheter Drainage 175 mL on 06/10/16   Antimicrobials: IV Ancef 05/24/16 x 1 dose   Diflucan 06/04/16 --> 06/10/16  IV Cefazolin for Pleurex Cathether on 06/08/16  Subjective: Seen and examined at bedside and daughter translated. Patient has had bowel movements daily and been ambulating. Only has abdominal pain on deep palpation. No N/V and no other complaints. No other concerns.  Objective: Vitals:   06/11/16 1334 06/11/16 2106 06/12/16 0443 06/12/16 1630  BP: 131/72 126/63 129/71 136/73  Pulse: 79 86 79 86  Resp: 16 16 16 16   Temp: 98.3 F (36.8 C) 99 F (37.2 C) 99 F (37.2 C) 97.7 F (36.5 C)  TempSrc:  Oral Oral Oral Oral  SpO2: 100% 97% 95% 100%  Weight:   75 kg (165 lb 5.5 oz)   Height:        Intake/Output Summary (Last 24 hours) at 06/12/16 1913 Last data filed at 06/12/16 1700  Gross per 24 hour  Intake             1380 ml  Output                0 ml  Net              1380 ml   Filed Weights   05/20/16 1818 06/05/16 0621 06/12/16 0443  Weight: 80.2 kg (176 lb 12.9 oz) 87.8 kg (193 lb 9 oz) 75 kg (165 lb 5.5 oz)   Examination: Physical Exam:  Constitutional: WN/WD, NAD and appears uncomfortable Eyes: Lids and conjunctivae normal, sclerae anicteric  ENMT: External Ears, Nose appear normal. Grossly normal hearing.  Neck: Appears normal, supple, no cervical masses, normal ROM, no appreciable thyromegaly Respiratory: Clear to auscultation bilaterally, no wheezing, rales, rhonchi or crackles. Normal respiratory effort and patient is not tachypenic. No accessory muscle use.  Cardiovascular: RRR, no murmurs / rubs / gallops. S1 and S2 auscultated. No extremity edema.  Abdomen: Soft, distended with some ascites. Mildly tender to palpate on deep palpation. No masses palpated. No appreciable hepatosplenomegaly. Bowel sounds positive x4 GU: Deferred. Musculoskeletal: No clubbing / cyanosis of digits/nails. No joint deformity upper and lower extremities.  Skin: No rashes, lesions, ulcers. No induration; Warm and dry.  Neurologic: CN 2-12 grossly intact with no focal deficits. Sensation intact in all 4 Extremities, DTR normal.  Psychiatric: Normal judgment and insight. Normal mood and appropriate affect.   Data Reviewed: I have personally reviewed following labs and imaging studies  CBC:  Recent Labs Lab 06/07/16 1917 06/08/16 0423 06/09/16 0421 06/09/16 1951 06/11/16 0423  WBC 8.0 7.6 6.9 6.7 5.3  NEUTROABS  --  6.0 5.7 4.9 3.6  HGB 10.2* 9.6* 10.0* 10.0* 9.3*  HCT 30.0* 28.6* 29.9* 30.7* 28.8*  MCV 86.2 86.1 85.9 84.3 84.5  PLT 494* 483* 517* 529* [id number]*   Basic Metabolic Panel:  Recent Labs Lab 06/07/16 0529 06/08/16 0423 06/09/16 0421 06/09/16 1951 06/10/16 1030 06/11/16 0423  NA 131* 131* 134* 131*  --  131*  K 4.3 4.6 4.2 4.2  --  4.4  CL 97* 98* 99* 97*  --  98*  CO2 27 27 28 28   --  25  GLUCOSE 162* 167* 147* 159*  --  148*    BUN 16 14 15 14   --  14  CREATININE 0.55 0.55 0.60 0.50 0.52 0.60  CALCIUM 7.8* 8.1* 8.2* 8.2*  --  8.0*  MG  --  1.7 1.7  --  1.8 1.8  PHOS  --  3.6 3.4  --  3.4 3.3   GFR: Estimated Creatinine Clearance: 58.9 mL/min (by C-G formula based on SCr of 0.6 mg/dL). Liver Function Tests:  Recent Labs Lab 06/08/16 0423 06/09/16 0421 06/09/16 1951 06/11/16 0423  AST 16 23 22 19   ALT 13* 18 20 20   ALKPHOS 111 112 131* 123  BILITOT 0.3 0.3 0.3 0.3  PROT 5.3* 5.3* 6.1* 5.3*  ALBUMIN 2.0* 2.1* 2.2* 2.2*   No results for input(s): LIPASE, AMYLASE in the last 168 hours. No results for input(s): AMMONIA in the last 168 hours. Coagulation Profile:  Recent Labs Lab 06/08/16 0423  INR 1.02   Cardiac Enzymes: No results  for input(s): CKTOTAL, CKMB, CKMBINDEX, TROPONINI in the last 168 hours. BNP (last 3 results) No results for input(s): PROBNP in the last 8760 hours. HbA1C: No results for input(s): HGBA1C in the last 72 hours. CBG:  Recent Labs Lab 06/06/16 0818 06/06/16 1204 06/06/16 1834 06/07/16 0028 06/07/16 0648  GLUCAP 127* 124* 118* 119* 137*   Lipid Profile: No results for input(s): CHOL, HDL, LDLCALC, TRIG, CHOLHDL, LDLDIRECT in the last 72 hours. Thyroid Function Tests: No results for input(s): TSH, T4TOTAL, FREET4, T3FREE, THYROIDAB in the last 72 hours. Anemia Panel: No results for input(s): VITAMINB12, FOLATE, FERRITIN, TIBC, IRON, RETICCTPCT in the last 72 hours. Sepsis Labs:  Recent Labs Lab 06/06/16 1726 06/06/16 2007 06/08/16 0423 06/10/16 1030  PROCALCITON 0.16  --  0.13 0.15  LATICACIDVEN 1.1 1.2  --   --     Recent Results (from the past 240 hour(s))  Culture, blood (routine x 2)     Status: None   Collection Time: 06/06/16  5:09 PM  Result Value Ref Range Status   Specimen Description BLOOD RIGHT HAND  Final   Special Requests IN PEDIATRIC BOTTLE 3CC  Final   Culture   Final    NO GROWTH 5 DAYS Performed at [facility]     Report Status 06/11/2016 FINAL  Final  Culture, blood (routine x 2)     Status: None   Collection Time: 06/06/16  5:20 PM  Result Value Ref Range Status   Specimen Description BLOOD LEFT ARM  Final   Special Requests BOTTLES DRAWN AEROBIC AND ANAEROBIC [place]  Final   Culture   Final    NO GROWTH 5 DAYS Performed at [facility]    Report Status 06/11/2016 FINAL  Final  Culture, Urine     Status: Abnormal   Collection Time: 06/07/16 12:32 AM  Result Value Ref Range Status   Specimen Description URINE, RANDOM  Final   Special Requests NONE  Final   Culture MULTIPLE SPECIES PRESENT, SUGGEST RECOLLECTION (A)  Final   Report Status 06/08/2016 FINAL  Final    Radiology Studies: No results found. Scheduled Meds: . enoxaparin (LOVENOX) injection  40 mg Subcutaneous Q24H  . feeding supplement  1 Container Oral QID  . pantoprazole (PROTONIX) IV  40 mg Intravenous Q24H  . protein supplement  8 oz Oral BID WC   Continuous Infusions: . sodium chloride 250 mL (06/11/16 1249)    LOS: 22 days    Kerney Elbe, DO [facility] Pager [phone]  If 7PM-7AM, please contact night-coverage [email or web address] Password [facility] 06/12/2016, 7:13 PM

## 2016-06-12 NOTE — Progress Notes (Signed)
Daily Progress Note   Patient Name: Audrey Garza       Date: 06/12/2016 DOB: Jun 06, 1940  Age: 76 y.o. MRN#: [id number] Attending Physician: Kerney Elbe, DO Primary Care Physician: No primary care provider on file. Admit Date: 05/20/2016  Reason for Consultation/Follow-up: Establishing goals of care, Non pain symptom management and Pain control  Subjective: Met today with patient and her daughter.    We discussed that she is now off TPN and plan to d/c octreotide this evening.  Reviewed with patient to let us know if she becomes sick after stopping to restart octreotide rather than reinsert NG.  If this occurs, she still may need venting g tube.  We discussed that nutrition is going to continue to be a concern moving forward and about trying to get in 4 Breeze supplements daily.  Discussed that she still has incurable illness and goal to pursue measures to add time and quality to life, however she still is with limited life expectancy and her daughter should still plan to make trip to visit as soon as it can be arranged.  Length of Stay: 22  Current Medications: Scheduled Meds:  . enoxaparin (LOVENOX) injection  40 mg Subcutaneous Q24H  . feeding supplement  1 Container Oral QID  . pantoprazole (PROTONIX) IV  40 mg Intravenous Q24H  . protein supplement  8 oz Oral BID WC    Continuous Infusions: . sodium chloride 250 mL (06/11/16 1249)    PRN Meds: acetaminophen (TYLENOL) oral liquid 160 mg/5 mL, albuterol, alteplase, alum & mag hydroxide-simeth, butamben-tetracaine-benzocaine, heparin lock flush, heparin lock flush, hydrALAZINE, HYDROmorphone (DILAUDID) injection, lidocaine, LORazepam, menthol-cetylpyridinium, ondansetron (ZOFRAN) IV, oxyCODONE-acetaminophen, phenol,  promethazine, simethicone, sodium chloride flush, sodium chloride flush, sodium chloride flush, zolpidem  Physical Exam        General: Alert, awake, in no acute distress.  Sitting in bedside chair HEENT: No bruits, no goiter, no JVD Heart: Regular rate and rhythm. No murmur appreciated. Lungs: Good air movement, clear Abdomen: Soft, nontender, mild distended, pleurex in place, positive bowel sounds.  Ext: No significant edema Skin: Warm and dry Neuro: Grossly intact, nonfocal.   Vital Signs: BP 136/73 (BP Location: Right Arm)   Pulse 86   Temp 97.7 F (36.5 C) (Oral)   Resp 16   Ht '5\' 3"'$  (1.6 m)  Wt 75 kg (165 lb 5.5 oz)   SpO2 100%   BMI 29.29 kg/m  SpO2: SpO2: 100 % O2 Device: O2 Device: Not Delivered O2 Flow Rate: O2 Flow Rate (L/min): 2 L/min  Intake/output summary:   Intake/Output Summary (Last 24 hours) at 06/12/16 1910 Last data filed at 06/12/16 1700  Gross per 24 hour  Intake             1380 ml  Output                0 ml  Net             1380 ml   LBM: Last BM Date: 06/11/16 Baseline Weight: Weight: 78 kg (171 lb 15.3 oz) Most recent weight: Weight: 75 kg (165 lb 5.5 oz)       Palliative Assessment/Data:    Flowsheet Rows   Flowsheet Row Most Recent Value  Intake Tab  Referral Department  Hospitalist  Unit at Time of Referral  Oncology Unit  Palliative Care Primary Diagnosis  Cancer  Date Notified  06/05/16  Palliative Care Type  New Palliative care  Reason for referral  Clarify Goals of Care, Non-pain Symptom, Pain  Date of Admission  05/20/16  Date first seen by Palliative Care  06/06/16  # of days Palliative referral response time  1 Day(s)  # of days IP prior to Palliative referral  16  Clinical Assessment  Palliative Performance Scale Score  40%  Pain Max last 24 hours  9  Pain Min Last 24 hours  4  Psychosocial & Spiritual Assessment  Palliative Care Outcomes  Patient/Family meeting held?  Yes  Who was at the meeting?  Patient, 2  daughters, son in law, grandson      Patient Active Problem List   Diagnosis Date Noted  . Intractable vomiting with nausea   . Palliative care encounter   . Acute esophagitis   . Peritoneal carcinomatosis ([place])   . Thrush of mouth and esophagus ([place])   . Abdominal distention   . Encounter for nasogastric (NG) tube placement   . Ileus ([place])   . Impaired nasal gastric feeding tube   . Gastroesophageal reflux disease   . N&V (nausea and vomiting)   . Partial small bowel obstruction 05/24/2016  . Malnutrition of moderate degree 05/23/2016  . 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 female  with past medical history of ovarian cancer admitted on 05/20/2016 with bowel obstruction.  Palliative consulted for nausea related to obstruction.   Recommendations/Plan: - Nausea: NG out and patient tolerating clear liquids.  Will stop octreotide.  If she becomes sick again, would restart octreotide to see is she feels better before attempting to reinsert NG.   - Goals of care: Desire is for FULL CODE, continue chemotherapy, follow-up with Dr. Marko Plume. - Palliative to see if called, but otherwise we will not round on [name] unless called.  Please let us know if we can be of further assistance in the care of [name].  Code Status:    Code Status Orders        Start     Ordered   05/20/16 1810  Full code  Continuous     05/20/16 1809    Code Status History    Date Active Date Inactive Code Status Order ID Comments User Context   This patient has  a current code status but no historical code status.       Prognosis:   < 6 months  Discharge Planning:  To Be Determined  Care plan was discussed with patient, daughter, RN, Dr. Alfredia Ferguson  Thank you for allowing the Palliative Medicine Team to assist in the care of this patient.   Total Time 30 Prolonged Time Billed No      Greater  than 50%  of this time was spent counseling and coordinating care related to the above assessment and plan.  Micheline Rough, MD  Please contact Palliative Medicine Team phone at [phone] for questions and concerns.

## 2016-06-12 NOTE — Progress Notes (Signed)
Pt ambulating around the unit several times,tolerating well with daughter

## 2016-06-12 NOTE — Progress Notes (Signed)
This CM met with pt and daughter at bedside. When pt asked if translator was needed, pt states that daughter can translate. This CM inquired whether pt would like a home health RN at discharge to help manage the pleurex drain. Pt and daughter state that they would like to manage it themselves at home. Daughter states that she would like pt granddaughter to come and be taught how to do the drainage by RN. This CM relayed message to staff RN and RN to schedule time when granddaughter can come and learn drainage of pleurex. Pt daughter states pt will be following up with Dr. Marko Plume as her MD at discharge. Box of drainage containers are in the room and family instructed to take them home at discharge. This CM filled out form for next box of drainage containers and have placed it on the front of chart for MD to sign. RN to pass on to MD to sign. CM will then fax to company. CM will continue to follow. Marney Doctor RN,BSN,NCM [phone]

## 2016-06-12 NOTE — Progress Notes (Signed)
Nutrition Follow-up  DOCUMENTATION CODES:   Non-severe (moderate) malnutrition in context of chronic illness  INTERVENTION:   -Continue Boost Breeze po QID, each supplement provides 250 kcal and 9 grams of protein -Provide Unjury Chicken Soup BID, each provides 100 kcal and 21g protein -Provided protein supplement option list -Encourage PO intake -RD to continue to monitor  NUTRITION DIAGNOSIS:   Increased nutrient needs related to cancer and cancer related treatments as evidenced by estimated needs.  Ongoing.  GOAL:   Patient will meet greater than or equal to 90% of their needs  Progressing.  MONITOR:   PO intake, Supplement acceptance, Labs, Weight trends, I & O's  REASON FOR ASSESSMENT:   Consult Diet education  ASSESSMENT:   With H/o metastatic ovarian cancer s/o surgery and chemo, last chemo in 02/2016 ,she  recently moved from [place]  [place] few weeks ago. She started to have progressive abdominal bloating and pain in the last three weeks  TPN d/c 1/7. Pt now on full liquid diet. Per MD, pt will be on primarily liquid diet. Recommend Boost Breeze supplements QID. Pt currently sipping on supplement during visit. Provided pt's daughter with list of lactose free, milk-free supplements (Unjury chicken soup, unflavored, also Prostat). Discussed where to find supplements outside of hospital. Pt with questions regarding appetite, interested in appetite stimulants. Encouraged pt's daughter to discuss with MD.  RD will order Unjury for pt to try.   If patient consumes Boost Breeze QID and Unjury Chicken Soup BID, this will provide 1200 kcal (69% of needs) and 78g protein (100% of needs). Pt encouraged to drink calorie dense liquids to help meet calorie needs.   Medications: IV Protonix daily  Labs reviewed: Low Na Mg/Phos/K WNL  Diet Order:  Diet full liquid Room service appropriate? Yes; Fluid consistency: Thin  Skin:  Reviewed, no issues  Last BM:   1/7  Height:   Ht Readings from Last 1 Encounters:  05/20/16 5\' 3"  (1.6 m)    Weight:   Wt Readings from Last 1 Encounters:  06/12/16 165 lb 5.5 oz (75 kg)    Ideal Body Weight:  52.3 kg  BMI:  Body mass index is 29.29 kg/m.  Estimated Nutritional Needs:   Kcal:  [id number]  Protein:  75-85g  Fluid:  1.7-1.9L/day  EDUCATION NEEDS:   No education needs identified at this time  Clayton Bibles, MS, RD, LDN Pager: [phone] After Hours Pager: [phone]

## 2016-06-13 DIAGNOSIS — E871 Hypo-osmolality and hyponatremia: Secondary | ICD-10-CM

## 2016-06-13 DIAGNOSIS — R112 Nausea with vomiting, unspecified: Secondary | ICD-10-CM

## 2016-06-13 LAB — CBC WITH DIFFERENTIAL/PLATELET
Basophils Absolute: 0 10*3/uL (ref 0.0–0.1)
Basophils Relative: 1 %
Eosinophils Absolute: 0.1 10*3/uL (ref 0.0–0.7)
Eosinophils Relative: 1 %
HEMATOCRIT: 27.9 % — AB (ref 36.0–46.0)
HEMOGLOBIN: 9.1 g/dL — AB (ref 12.0–15.0)
LYMPHS ABS: 1.5 10*3/uL (ref 0.7–4.0)
LYMPHS PCT: 28 %
MCH: 28 pg (ref 26.0–34.0)
MCHC: 32.6 g/dL (ref 30.0–36.0)
MCV: 85.8 fL (ref 78.0–100.0)
Monocytes Absolute: 0.5 10*3/uL (ref 0.1–1.0)
Monocytes Relative: 9 %
NEUTROS ABS: 3.2 10*3/uL (ref 1.7–7.7)
NEUTROS PCT: 61 %
Platelets: 525 10*3/uL — ABNORMAL HIGH (ref 150–400)
RBC: 3.25 MIL/uL — ABNORMAL LOW (ref 3.87–5.11)
RDW: 12.8 % (ref 11.5–15.5)
WBC: 5.2 10*3/uL (ref 4.0–10.5)

## 2016-06-13 LAB — COMPREHENSIVE METABOLIC PANEL
ALBUMIN: 2.2 g/dL — AB (ref 3.5–5.0)
ALK PHOS: 119 U/L (ref 38–126)
ALT: 20 U/L (ref 14–54)
ANION GAP: 8 (ref 5–15)
AST: 19 U/L (ref 15–41)
BILIRUBIN TOTAL: 0.4 mg/dL (ref 0.3–1.2)
BUN: 13 mg/dL (ref 6–20)
CALCIUM: 8.2 mg/dL — AB (ref 8.9–10.3)
CO2: 26 mmol/L (ref 22–32)
Chloride: 100 mmol/L — ABNORMAL LOW (ref 101–111)
Creatinine, Ser: 0.62 mg/dL (ref 0.44–1.00)
GFR calc non Af Amer: >60 mL/min (ref >60)
GLUCOSE: 114 mg/dL — AB (ref 65–99)
Potassium: 4.3 mmol/L (ref 3.5–5.1)
Sodium: 134 mmol/L — ABNORMAL LOW (ref 135–145)
TOTAL PROTEIN: 5.4 g/dL — AB (ref 6.5–8.1)

## 2016-06-13 LAB — PHOSPHORUS: Phosphorus: 3.4 mg/dL (ref 2.5–4.6)

## 2016-06-13 LAB — MAGNESIUM: Magnesium: 1.7 mg/dL (ref 1.7–2.4)

## 2016-06-13 MED ORDER — OXYCODONE-ACETAMINOPHEN 5-325 MG PO TABS: 1.0000 | ORAL_TABLET | ORAL | 0 refills | Status: DC | PRN

## 2016-06-13 MED ORDER — PROMETHAZINE HCL 12.5 MG PO TABS: 12.5000 mg | ORAL_TABLET | Freq: Four times a day (QID) | ORAL | 0 refills | Status: DC | PRN

## 2016-06-13 MED ORDER — ALUM & MAG HYDROXIDE-SIMETH 200-200-20 MG/5ML PO SUSP: 60.0000 mL | ORAL | 0 refills | Status: DC | PRN

## 2016-06-13 MED ORDER — GLYCERIN (LAXATIVE) 2.1 G RE SUPP
1.0000 | RECTAL | Status: DC | PRN
  Administered 2016-06-13: 1 via RECTAL
  Filled 2016-06-13 (×2): qty 1

## 2016-06-13 MED ORDER — BOOST / RESOURCE BREEZE PO LIQD: 1.0000 | Freq: Three times a day (TID) | ORAL | 0 refills | Status: DC

## 2016-06-13 MED ORDER — UNJURY CHICKEN SOUP POWDER: 8.0000 [oz_av] | Freq: Two times a day (BID) | ORAL | 0 refills | Status: DC

## 2016-06-13 MED ORDER — GLYCERIN (LAXATIVE) 2.1 G RE SUPP: 1.0000 | RECTAL | 0 refills | Status: DC | PRN

## 2016-06-13 MED ORDER — PANTOPRAZOLE SODIUM 40 MG PO TBEC: 40.0000 mg | DELAYED_RELEASE_TABLET | Freq: Every day | ORAL | 0 refills | Status: DC

## 2016-06-13 MED ORDER — HEPARIN SOD (PORK) LOCK FLUSH 100 UNIT/ML IV SOLN
500.0000 [IU] | INTRAVENOUS | Status: DC | PRN
  Filled 2016-06-13: qty 5

## 2016-06-13 NOTE — Progress Notes (Signed)
Discharge instructions reviewed with daughter and patient. Daughter verbalized understanding. Patient to be discharged via private vehicle when grandson arrives.

## 2016-06-13 NOTE — Progress Notes (Signed)
Medical Oncology June 13, 2016, 11:44 AM  Hospital day 25 Antibiotics: none Chemotherapy: day 15 cycle 1 doxil TNA and octreotide [facility]  EMR reviewed. Patient seen, with daughter here. Spoke with case manager and with Dr Alfredia Ferguson.  Subjective: Feeling progressively better. Tolerating clear liquids including 3 Resource supplements and protein powder on 06-12-16, no nausea or vomiting. Is passing gas, no BM 1-8 or so far today, feels some pressure as if bowels need to move; I believe last time peritoneal catheter was drained was 06-10-16  for 175 cc. Walked 8 laps in hall yesterday, up in room without difficulty. Throat continues to improve since NG out. No cough or SOB. Minimal nasal congestion. No bleeding. No swelling LE. No other pain. No problems with PAC. Mouth not sore. Does not feel hungry but is glad to drink Resource as instructed. Family expects to get Resource supplements from [facility], needs at least 4 daily for now.   ONCOLOGIC HISTORY Patient had hysterectomy without oophorectomy in her 3s for benign indication, and been in her usual good health until July 2016, when she developed abdominal discomfort and bloating. In late 03-2015 she was found to have large pelvic mass and perihepatic ascites. CA 125 then was 359, with CEA of 3, CA 19-9 5.8 and alpha fetoprotein 2.3. She had exploratory laparotomy 04-19-15, which was not done by gyn oncologist, with bilateral ovaried an tumors, 10 cm with capsule ruptured on right , smaller on left, peritoneal carcinomatosis. Washings were done, however accompanying information does not describe extent of debulking. She had some bowel obstruction post operatively, with NG used ~ POD 3. Pathology available 05-05-15 showed cystic and solid left ovarian mass, 13 cm right ovarian mass, moderately differentiated tumor "tumor de celulas de la granulosa". 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 CT AP 122016 had ascites, carcinomatosis and multiple peritoneal implants. CA 125 on 05-24-15 baseline for chemo was 141. She received 3 cycles of taxol + carboplatin from 05-26-15 thru 07-07-15 (not clear if avastin given those cycles), after which carboplatin was not available in [place]. She received taxol 175 mg/m2, CDDP and avastin for 3 additional cycles thru 10-07-15. Second look laparotomy was discussed, patient declined, and instead had 4 cycles avastin + taxol from 12-01-15 thru 02-02-16 (family is confirming, but we believe no CDDP after 10-07-15 and no carboplatin after 07-07-15). CT CAP 02-24-16 reportedly had no evidence of malignancy, with some bullous emphysema, tortuous sigmoid colon with diverticulosis. . Recommendation was for maintenance avastin x 1 year, however patient then came to [facility] in 02-2016. She has not established with any physicians since coming to [place]. CT AP 05-20-16 lias arge volume ascites, extensive peritoneal disease with omental thickening, trace bilateral pleural effusions, left colonic diverticulosis, bilateral inguinal hernias containing fat, post hysterectomy, no adnexal masses, no adenopathy. US paracentesis 05-22-16 for 2.9 liters of yellow fluid. Cytology adenocarcinoma withserous features, immunohistochemical stains confirm gyn. First doxil given 05-30-16, chosen due to side effect profile NOTE MAY STILL BE PLATINUM SENSITIVE. She did not tolerate initial attempt at DC NG, was on TNA support, bowel obstruction seemed to improve with addition of octreotide infusion. By time of DC on 06-13-16, she was tolerating clear liquids including clear supplements (lactose intolerant).     Objective: Vital signs in last 24 hours: Blood pressure 128/66, pulse 85, temperature 98.9 F (37.2 C), temperature source Oral, resp. rate 16, height 5\' 3"  (1.6 m), weight 167 lb 8.8 oz (  76 kg), SpO2 95 %. Looks brighter, appears comfortable sitting on edge of recliner,  respirations not labored RA.  Oral mucosa clear and moist. PERRL, not icteric. Lungs without wheezes or rales anteriorly or posteriorly. PAC locked, site ok. Heart RRR no gallop. Abdomen somewhat full especially in upper quadrants, not tightly distended, few BS no rushing or high pitched noises. Peritoneal drain on right with dressings clean and dry. LE no edema, cords, tenderness. Repositions in chair easily. Skin without rash or ecchymoses. NSL in RUE  Intake/Output from previous day: 01/08 0701 - 01/09 0700 In: [place] [P.O.:1200; I.V.:180] Out: 700 [Urine:700] Intake/Output this shift: No intake/output data recorded.    Lab Results:  Recent Labs  06/11/16 0423 06/13/16 0449  WBC 5.3 5.2  HGB 9.3* 9.1*  HCT 28.8* 27.9*  PLT 497* 525*   BMET  Recent Labs  06/11/16 0423 06/13/16 0449  NA 131* 134*  K 4.4 4.3  CL 98* 100*  CO2 25 26  GLUCOSE 148* 114*  BUN 14 13  CREATININE 0.60 0.62  CALCIUM 8.0* 8.2*   Remainder of CMET today calcium 8.2, t prot 5.4, alb 2.2  Studies/Results: No results found.  DISCUSSION: Reviewed keeping diet liquids, no roughage or extra fiber, likely at least for next several weeks until hopefully more progress from chemo, as she remains at high risk for recurrent bowel obstruction.  Discussed avoiding all milk products, as she is lactose intolerant and this will cause more problems with bowel gas. Discussed draining peritoneal catheter based on symptoms from ascites, likely will not need this more than 1-2x weekly at most for now. Per my conversation with Dr Alfredia Ferguson, expect unit RN will try draining peritoneal catheter today prior to DC, to show family again (doubt will be able to have [place] due to insurance issues). Case manager ordering extra supplies, for which this MD has signed.   Next chemo expected in 1-2 weeks depending on performance status, counts etc. I will recheck CA 125 with my visit at [facility] next week, this 249.8 on 05-21-16. Clinically she  has improved with all of interventions in hospital, so may continue doxil as treatment regimen now, tho note she may not be platinum resistant, so may consider [place] gemzar if/ when needed.  Scheduling request sent to [facility] now, for MD with lab 06-19-16. Will ask [place] financial counselors to follow up, may be eligible for some outpatient pharmacy assistance funds etc. WIll ask [facility] nutritionist to follow up.Will update gyn oncology   Assessment/Plan: 1.recurrent ovarian carcinoma: IIIC high grade serous, carcinomatosis with large volume ascites recurrent within 4 months of last chemo (taxol + avastin 02-02-16). Received first doxil 05-30-16, WBC and platelets maintaining,  oral/ esophageal mucositis essentially resolved. Not candidate for surgery including for the bowel obstruction per gyn onc and general surgery, but may not be platinum resistant and may still get some control/ some improvement with systemic treatment.  I will see her at [facility] with lab on 06-19-16. See above re chemo. Paracentesis 2.9 liters malignant ascites on 05-22-16, 4 liters 06-06-16 and almost 1 liter with peritoneal drain placementon 06-08-16, but minimal ascites (175 ml) on 06-10-16. Recommend RN draining again prior to DC home today, to reinforce teaching with family. Would drain only as needed, likely will not need more than 1-2 x weekly for now, and may improve further if responding to chemo.  Last CDDP 10-2015 and last carboplatin 07-07-15 by review of records from [place].  2. Partial small bowel obstruction:  tolerating NG out, passing gas and BMs. Lactose intolerant so needs to strictly avoid milk products to lessen gas. Did improve after starting octreotide infusion, Dr Kirstie Mirza help appreciated. Would not advance diet beyond liquids until hopefully additional improvement in carcinomatosis from chemo, but will need to be mainly clears + Resource breeze due to lactose intolerance.  3. Nutrition: TNA used for short term  support 05-31-16 thru 06-11-16. . Encouraged Resource supplement 4x daily, will need to continue this at home. I would prefer liquid diet for now, due to high risk for recurrent obstruction. NOTE lactose intolerant, so most full liquids likely to make abdominal gas worse. Needs as much protein as possible due to losses with malignant ascites. 4.Anemia: likely from chemo, blood draws etc. No bleeding. Iron studies not done 1-8, will follow up outpatient. 5.marked diverticulosis by CT, without evidence diverticulitis then.  6.social situation: recently moved from [place] to be with daughters in [place], family very supportive. Patient speaks Spanish. No insurance tho may be medicaid eligible.  7..PAC placed by IR 8.deconditioning from present illness: had been walking several miles daily and in very good physical condition prior to admission. Tolerating more activity now 10. Echocardiogram with good EF 05-25-16, for doxil. Aortic atherosclerosis by CT 11. flu vaccine done 05-28-16 12.oral candida, oral and presumed esophageal mucositis from doxil: improved. Off diflucan    Evlyn Clines MD

## 2016-06-13 NOTE — Discharge Summary (Signed)
Physician Discharge Summary  Donae Shiley [id number] DOB: Aug 18, 1940 DOA: 05/20/2016  PCP: No primary care provider on file.  Admit date: 05/20/2016 Discharge date: 06/13/2016  Admitted From: Home Disposition: Home  Recommendations for Outpatient Follow-up:  1. Follow up with and establish PCP in 1-2 weeks with resources from [facility] 2. Follow up with Dr. Evlyn Clines as an outpatient 3. Please obtain BMP/CBC in one week  Home Health: No Equipment/Devices: Pleurex Cathter Kits  Discharge Condition: Stable CODE STATUS: FULL CODE Diet recommendation: Clear/Full Liquid Diet with No Milk Products    Brief/Interim Summary: 76 year old female with a history of metastatic ovarian cancer diagnosed in January 2017 (s/p primary debulking surgery followed by 8 adjuvant cycles of carboplatin and paclitaxel and 4 additional cycles of carboplatin, paclitaxel, avastin), last chemo Sept 2017, now presented with progressive abdominal bloating and pain for 3 weeks duration. She was feeling well until the first week of December, 2017 when she was sick with a virus from which she never recovered. She states that she began feeling distended in the abdomen and uncomfortable and was taken to the ED on 05/20/16.  CT chest/abd/pelvis9-21-17 reportedly with no evidence of malignancy, with some bullous emphysema, tortuous sigmoid colon with diverticulosis.  Recommendation was for maintenance avastin x 1 year, however patient then came to [facility] in 02-2016. She has not established care with any physician since coming to [place].   CT Abdomen and Pelvis 05-20-16 showed large volume ascites, extensive peritoneal disease with omental thickening, trace bilateral pleural effusions, left colonic diverticulosis, bilateral inguinal hernias containing fat, post hysterectomy, no adnexal masses, no adenopathy. US paracentesis 05-22-16 revealed 2.9 liters of yellow fluid. 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   Unfortunately, after paracentesis she began having increasing abdominal pain. Abdominal x-ray revealed distended small bowel loops concerning for small bowel obstruction. Gen. surgery was consulted. NG was inserted to suction. Because of prolonged Hospitalization she was placed on TPN/TNA. She underwent Chemotheray with Doxil on 12/26. Repeat U/S 12/31 showed interval progression of ascites and She is s/p repeat Paracentesis 06-06-16 which removed 4 liters.   She went down for Pleux of abdomen 1/4 with 900 mL removed. Patient was eventually transitioned to Clear Liquid Diet and NGT was removed. Diet was advanced to Full Liquid Diet and Patient was having bowel movements now. Patient with Dr. Marko Plume this AM who prefers keeping the patient at a liquid diet for now and slowly gradually increase as an outpatient if improves with chemotherapy.   Has tolerated Clears and Octreotide decreasing and D/C'd on 1/8. She improved tremendously today and has had bowel movements since having NGT removed and at this time ready to be D/C'd home and follow up with Dr. Marko Plume as an outpatient.   PROCEDURES DURING HOSPITALIZATION : CT abdomen and pelvis 05/20/2016  Abdominal x-rays 05/23/2016, 05/24/2016, 05/25/2016, 05/27/2016, 05/28/2016, 05/30/2016, 05/28/2016, 05/29/2016  Right IJ single-lumen power port catheter insertion per Dr. Annamaria Boots 05/24/2016  2-D echo 05/24/2016  Ultrasound-guided paracentesis - 2.9 L of clear yellow fluid removed--Dr. Earleen Newport 05/22/2016 cytology positive for adenocarcinoma  Ultrasound-guided paracentesis - 4.0L of clear yellow fluid removed 06/06/2016  CT Abd/Pelvis 06/06/16  Tunneled Peritoneal Drainage Catheter in place and 900 mL of Serous Pleural Fluid removed 06/08/16  NGT Removed 06/09/16  Peritoneal Catheter Drainage 175 mL on 06/10/16  Octreotide D/C'd 06/12/16  Discharge Diagnoses:  Active Problems:   Malignant ascites   Malnutrition of moderate degree    Ovarian carcinoma (HCC)   SBO (small  bowel obstruction)   Gastroesophageal reflux disease   N&V (nausea and vomiting)   Impaired nasal gastric feeding tube   Ileus (HCC)   Abdominal distention   Encounter for nasogastric (NG) tube placement   Thrush of mouth and esophagus (HCC)   Acute esophagitis   Peritoneal carcinomatosis ([place])   Palliative care encounter   Intractable vomiting with nausea  Metastatic ovarian cancer with extensive peritoneal disease with omental thickening and peritoneal carcinomatosis with large ascites s/p Pleurx Catheter Drain - Appreciated Drs. Livesay and Rossi's recommendations. Dr. Nancy Marus of [place] also [place].  - Progressive within 4 months of last systemic treatment, she has been taxol + avastin 02-02-16; Received Doxil during hospitalization on 05/30/16 - rapidly symptomatic with malignant ascites and peritoneal carcinomatosis.  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 Denman George, pt is not a candidate for interval debulking surgery due to the ? platinum resistant nature of her disease; Dr. Alycia Rossetti re-evaluated per Dr. Mariana Kaufman recommendatons - She should be considered for palliative G tube if becomes more symptomatic - Unclear patient's prognosis and response to recent doxil - symptomatic management with pain control  - palliative care team consulted for assistance - Started on Octreotide by Dr. Domingo Cocking and agree; Octreotide dose reduced by Palliative Care Medicine and D/C'd yesterday.  -Dr. Alycia Rossetti consulted and recommended Plerux Cathether drain which was placed by IR   -Pluerx Cath in place and had 900 mL removed when it was inserted; Had 175 mL drained a few days ago. Granddaughter taught how to drain Catheter and supplies given -Continue Liquid Diet with Resource Breeze/Boost Supplements and follow up with Dr. Marko Plume as an outpatient.   Small Bowel Obstruction, improved - 05/23/2016 abdominal x-ray multiple dilated loops of bowel concerning for small bowel obstruction - Likely due to the extensive peritoneal carcinomatosis - If recurrent SBO, if re-occurs, she might need G tube as she is not a candidate for bowel Surgery per Gyn Onc and General Surgery.  - PCT consulted for further [place] discussion  - IR consulted and Tunneled Plurex Cather in place for ascites - Octreotide per Palliative Care and now D/C'd -Tolerated Clears and Advanced to Full Liquid Diet yesterday; Continue Full Liquids per Dr. Marko Plume; TPN discontinued -Avoid Milk Products   Mild Hyponatremia - Secondary to volume depletion vs. Hyponatremia from [place]; Na+ was 134 - BMP as an outpatient   Mild Thrombocytosis - likely reactive; Platelet went from 448 -> 483 -> 517 -> 529 -> 497 -> 525 -Repeat CBC as an outpatient   Protein Calorie Malnutrition - in the context of acute on chronic illness - nutritionist assistance appreciated; C/w Resource Breeze Supplements    Anemia of chronic disease, malignancy - Hg overall stable  - Hb/Hct went from [phone] -> 9.2/27.4 -> 9.6/28.6 -> 10.0/29.9 -> 10.0/30.7 -> 9.3/28.8 -> 9.1/27.9 -Repeat CBC as an outpatient  Oral Thrush - Diflucan Discontinued   Discharge Instructions  Discharge Instructions    Call MD for:  difficulty breathing, headache or visual disturbances    Complete by:  As directed    Call MD for:  extreme fatigue  Complete by:  As directed    Call MD for:  persistant dizziness or light-headedness    Complete by:  As directed    Call MD for:  persistant nausea and vomiting    Complete by:  As directed     Call MD for:  severe uncontrolled pain    Complete by:  As directed    Call MD for:  temperature >100.4    Complete by:  As directed    Diet full liquid    Complete by:  As directed    NO MILK PRODUCTS. LACTOSE INTOLERANT   Discharge instructions    Complete by:  As directed    Follow up and establish with PCP and follow up with Dr. Marko Plume in Oncology; Oncology Clinic trying to schedule an appointment for Monday. Remain on Full Liquid Diet with no Milk Products. Take medications as prescribed. If symptoms change or worsen please return to the ED for Evaluation.   Increase activity slowly    Complete by:  As directed    SCHEDULING COMMUNICATION    Complete by:  As directed    Chemotherapy Appointment  - 1.5 hour    TREATMENT CONDITIONS    Complete by:  As directed    Patient should have CBC & CMP within 7 days prior to chemotherapy administration. NOTIFY MD IF: ANC < 1500, Hemoglobin < 8, PLT < 100,000,  Total Bili > 1.5, Creatinine > 1.5, ALT & AST > 80 or if patient has unstable vital signs: Temperature > 38.5, SBP > 180 or < 90, RR > 30 or HR > 100.     Allergies as of 06/13/2016   No Known Allergies     Medication List    TAKE these medications   acetaminophen 325 MG tablet Commonly known as:  TYLENOL Take 650 mg by mouth every 5 (five) hours as needed for moderate pain.   alum & mag hydroxide-simeth 200-200-20 MG/5ML suspension Commonly known as:  MAALOX/MYLANTA Take 60 mLs by mouth every 4 (four) hours as needed for indigestion.   feeding supplement Liqd Take 1 Container by mouth 3 (three) times daily with meals.   GAS-X 80 MG chewable tablet Generic drug:  simethicone Chew 160 mg by mouth every 6 (six) hours as needed for flatulence.   Glycerin (Adult) 2.1 g Supp Place 1 suppository rectally as needed for moderate constipation.   lidocaine-prilocaine cream Commonly known as:  EMLA Apply to port 1 hour before access with needle. Cover with plastic wrap.    magnesium 30 MG tablet Take 30 mg by mouth 2 (two) times daily.   ondansetron 8 MG disintegrating tablet Commonly known as:  ZOFRAN ODT Take 1 tablet (8 mg total) by mouth every 8 (eight) hours as needed for nausea or vomiting.   oxyCODONE-acetaminophen 5-325 MG tablet Commonly known as:  PERCOCET/ROXICET Take 1 tablet by mouth every 4 (four) hours as needed for moderate pain or severe pain.   pantoprazole 40 MG tablet Commonly known as:  PROTONIX Take 1 tablet (40 mg total) by mouth daily.   Potassium 75 MG Tabs Take 75 mg by mouth daily.   promethazine 12.5 MG tablet Commonly known as:  PHENERGAN Take 1 tablet (12.5 mg total) by mouth every 6 (six) hours as needed for nausea or vomiting.   protein supplement Powd Commonly known as:  UNJURY CHICKEN SOUP Take 27 g (8 oz total) by mouth 2 (two) times daily with a meal.  No Known Allergies  Consultations:  Gyn Oncology Dr. Denman George and Dr. Cranston Neighbor Surgery  Oncology Dr. Marko Plume  Palliative Care Medicine  Interventional Radiology  Procedures/Studies: Dg Abd 1 View  Result Date: 05/29/2016 CLINICAL DATA:  76 year old female with nasogastric tube placement. Subsequent encounter. EXAM: ABDOMEN - 1 VIEW COMPARISON:  05/29/2016. FINDINGS: Nasogastric tube tip curled upon itself in the distal esophagus and needs to be reposition. Gas distended small bowel loops measuring up to 6.8 cm consistent with small bowel obstruction similar to earlier exam. The possibility of free intraperitoneal air cannot be assessed on a supine view. IMPRESSION: Nasogastric tube tip curled upon itself in the distal esophagus and needs to be reposition. Small bowel obstruction. These results will be called to the ordering clinician or representative by the Radiologist Assistant, and communication documented in the PACS or zVision Dashboard. Electronically Signed   By: Genia Del M.D.   On: 05/29/2016 14:46   Dg Abd 1 View  Result Date:  05/28/2016 CLINICAL DATA:  Increased abdominal distention and patient with recent small bowel obstruction. Status post NG tube removal. EXAM: ABDOMEN - 1 VIEW COMPARISON:  Two views of the abdomen 05/27/2016. FINDINGS: NG tube has been removed. There is new dilatation of small bowel loops up to 4.9 cm. No evidence of free air on single view of the abdomen. IMPRESSION: New gaseous distention of small bowel worrisome for recurrent obstruction. Electronically Signed   By: Inge Rise M.D.   On: 05/28/2016 10:36   Dg Abd 1 View  Result Date: 05/23/2016 CLINICAL DATA:  Assess nasogastric tube positioning. EXAM: ABDOMEN - 1 VIEW COMPARISON:  Chest x-ray of earlier today. FINDINGS: The nasogastric tube has been partially withdrawn such that the proximal port is at or just below the GE junction. The distal port lies in the proximal gastric fundus. There is a small amount of gas within the stomach. There is moderate gaseous distention of small-bowel loops in the mid abdomen. IMPRESSION: The nasogastric tubes proximal port is at or just below the GE junction. Advancement by 5 cm is recommended to assure that the proximal port does remain below the GE junction. Distended small bowel loops consistent with obstruction. Electronically Signed   By: David  Martinique M.D.   On: 05/23/2016 15:10   Dg Abd 1 View  Result Date: 05/23/2016 CLINICAL DATA:  Diffuse abdominal pain with nausea and vomiting EXAM: ABDOMEN - 1 VIEW COMPARISON:  CT abdomen and pelvis May 20, 2016 FINDINGS: Multiple loops of dilated bowel raise concern for a degree of bowel obstruction. No free air evident. There is moderate stool in the colon. IMPRESSION: Bowel gas pattern concerning for obstruction.  No free air evident. These results will be called to the ordering clinician or representative by the Radiologist Assistant, and communication documented in the PACS or zVision Dashboard. Electronically Signed   By: Lowella Grip III M.D.   On:  05/23/2016 09:28   Ct Chest Wo Contrast  Result Date: 06/06/2016 CLINICAL DATA:  History of metastatic ovarian cancer presents with progressive abdominal bloating and pain for 3 weeks. Fever and dyspnea EXAM: CT CHEST WITHOUT CONTRAST TECHNIQUE: Multidetector CT imaging of the chest was performed following the standard protocol without IV contrast. COMPARISON:  Chest x-ray 06/05/2016, CT abdomen pelvis 05/20/2016 FINDINGS: Cardiovascular: Limited evaluation without the presence of intravenous contrast. Atherosclerotic calcifications of the aorta. Right-sided central venous catheter is present, the tip terminates in the distal SVC. There are coronary artery calcifications. Heart size nonenlarged. No  significant pericardial effusion. Mediastinum/Nodes: Imaged thyroid gland within normal limits. Trachea is midline. Esophageal tube is present and there is a moderate hiatal hernia, the tip terminates in the proximal stomach. Limited assessment for hilar adenopathy without contrast. Subcentimeter nonspecific mediastinal lymph nodes. Lungs/Pleura: Small bilateral pleural effusions, increased compared to prior CT, larger on the left side. Interim development of partial consolidation within the bilateral lower lobes. Few mild foci of ground-glass density within the apical portion of the right upper lobe and the subpleural anterior left upper lobe which may reflect small foci of infection or inflammation. No pneumothorax. Upper Abdomen: Moderate volume ascites in the upper abdomen. Extensive mesenteric and omental nodularity as noted on prior CT and consistent with metastatic disease. Gallbladder appears dilated. Musculoskeletal: No suspicious bone lesions. IMPRESSION: 1. Small left greater than right pleural effusions, increased compared to prior CT. Interim development of partial consolidations within the bilateral lower lobes which may reflect atelectasis or pneumonia. Few scattered foci of ground-glass density within  the right upper and left upper lobes, suspect small foci of inflammation or infection. 2. Moderate to large volume of ascites in the upper abdomen with extensive mesenteric and omental metastatic disease partially visualized. 3. Dilated gallbladder. Electronically Signed   By: Donavan Foil M.D.   On: 06/06/2016 20:07   Ct Abdomen Pelvis W Contrast  Result Date: 05/20/2016 CLINICAL DATA:  Abdominal distention, pain. EXAM: CT ABDOMEN AND PELVIS WITH CONTRAST TECHNIQUE: Multidetector CT imaging of the abdomen and pelvis was performed using the standard protocol following bolus administration of intravenous contrast. CONTRAST:  119mL ISOVUE-300 IOPAMIDOL (ISOVUE-300) INJECTION 61% COMPARISON:  None. FINDINGS: Lower chest: Bibasilar atelectasis. Heart is normal size. Moderate-sized hiatal hernia. Trace bilateral effusions. Hepatobiliary: No focal hepatic abnormality. Gallbladder unremarkable. Pancreas: No focal abnormality or ductal dilatation. Spleen: No focal abnormality.  Normal size. Adrenals/Urinary Tract: Small cyst in the midpole of the left kidney. No hydronephrosis. Adrenal glands and urinary bladder unremarkable. Stomach/Bowel: Descending colonic and sigmoid diverticulosis. No active diverticulitis. Stomach and small bowel unremarkable. Vascular/Lymphatic: Aortic and iliac calcifications. No aneurysm. No adenopathy. Reproductive: Prior hysterectomy.  No adnexal masses. Other: There is large volume ascites in the abdomen or pelvis. Extensive abnormal soft tissue throughout the omentum R with peritoneal malignancy. No free air. Bilateral inguinal hernias containing fat. Musculoskeletal: No acute bony abnormality or focal bone lesion. IMPRESSION: Extensive peritoneal disease with omental thickening/ 18 and large volume ascites. 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 Trace bilateral pleural effusions. Moderate hiatal hernia. Bilateral inguinal hernias containing fat. Left  colonic diverticulosis. Aortoiliac atherosclerosis. Electronically Signed   By: Rolm Baptise M.D.   On: 05/20/2016 15:10   US Abdomen Limited  Result Date: 05/28/2016 CLINICAL DATA:  History of abdominal carcinomatosis and ascites due to ovarian carcinoma. Evaluate for paracentesis. EXAM: LIMITED ABDOMEN ULTRASOUND FOR ASCITES TECHNIQUE: Limited ultrasound survey for ascites was performed in all four abdominal quadrants. COMPARISON:  CT abdomen and pelvis 05/20/2016. FINDINGS: Small volume of abdominal and pelvic ascites is identified and inadequate for paracentesis. IMPRESSION: As above. Electronically Signed   By: Inge Rise M.D.   On: 05/28/2016 11:12   US Abdomen Port  Result Date: 06/04/2016 CLINICAL DATA:  Progressive abdominal distension over the last 3 weeks. History of metastatic ovarian cancer. EXAM: ABDOMEN ULTRASOUND COMPLETE COMPARISON:  Ultrasound 05/28/2016.  CT 05/20/2016. FINDINGS: Gallbladder: No gallstones or wall thickening visualized. No sonographic Murphy sign noted by sonographer. Common bile duct: Diameter: 3 mm, normal Liver: No focal lesion identified.  Within normal limits in parenchymal echogenicity. IVC: No abnormality visualized. Pancreas: Not visualized because of bowel gas. Spleen: Size and appearance within normal limits. Right Kidney: Length: 9.6 cm. Echogenicity within normal limits. No mass or hydronephrosis visualized. Left Kidney: Length: 9 x 1 cm. Echogenicity within normal limits. No mass or hydronephrosis visualized. Abdominal aorta: No aneurysm visualized. Other findings: Moderate to large amount of ascites freely distributed in all 4 quadrants. Increased since 12/24. IMPRESSION: Increased amount of ascites, freely distributed in all 4 quadrants. No other acute finding by ultrasound. Electronically Signed   By: Nelson Chimes M.D.   On: 06/04/2016 16:11   US Paracentesis  Result Date: 06/06/2016 INDICATION: Patient with a history of metastatic ovarian cancer  now with recurrent abdominal ascites. Request is made for therapeutic paracentesis. EXAM: ULTRASOUND GUIDED THERAPEUTIC PARACENTESIS MEDICATIONS: 1% lidocaine COMPLICATIONS: None immediate. PROCEDURE: Informed written consent was obtained from the patient after a discussion of the risks, benefits and alternatives to treatment. A timeout was performed prior to the initiation of the procedure. Initial ultrasound scanning demonstrates a moderate amount of ascites within the right lower abdominal quadrant. The right lower abdomen was prepped and draped in the usual sterile fashion. 1% lidocaine was used for local anesthesia. Following this, a 19 gauge, 7-cm, Yueh catheter was introduced. An ultrasound image was saved for documentation purposes. The paracentesis was performed. The catheter was removed and a dressing was applied. The patient tolerated the procedure well without immediate post procedural complication. FINDINGS: A total of approximately 4 L of yellow fluid was removed. IMPRESSION: Successful ultrasound-guided paracentesis yielding 4 liters of peritoneal fluid. Read by: Saverio Danker, PA-C Electronically Signed   By: Sandi Mariscal M.D.   On: 06/06/2016 11:31   US Paracentesis  Result Date: 05/22/2016 INDICATION: Patient with metastatic ovarian cancer and malignant ascites presents for diagnostic and therapeutic paracentesis. EXAM: ULTRASOUND GUIDED DIAGNOSTIC AND THERAPEUTIC PARACENTESIS MEDICATIONS: 10 mL 1% lidocaine COMPLICATIONS: None immediate. PROCEDURE: Informed written consent was obtained from the patient after a discussion of the risks, benefits and alternatives to treatment. A timeout was performed prior to the initiation of the procedure. Initial ultrasound scanning demonstrates a large amount of ascites within the right lower abdominal quadrant. The right lower abdomen was prepped and draped in the usual sterile fashion. 1% lidocaine was used for local anesthesia. Following this, a 19 gauge,  7-cm, Yueh catheter was introduced. An ultrasound image was saved for documentation purposes. The paracentesis was performed. The catheter was removed and a dressing was applied. The patient tolerated the procedure well without immediate post procedural complication. FINDINGS: A total of approximately 2.9 liters of clear, yellow fluid was removed. Samples were sent to the laboratory as requested by the clinical team. IMPRESSION: Successful ultrasound-guided paracentesis yielding 2.9 liters of peritoneal fluid. Read by:  Brynda Greathouse PA-C Electronically Signed   By: Corrie Mckusick D.O.   On: 05/22/2016 12:49   Ir Perc Athena Masse Perit Cath Wo Port  Result Date: 06/08/2016 INDICATION: History of ovarian cancer recurrent symptomatic malignant ascites. Please perform ultrasound and fluoroscopic guided peritoneal drainage catheter placement for palliative purposes. EXAM: ULTRASOUND AND FLUOROSCOPIC GUIDED PLACEMENT OF TUNNELED PERITONEAL CATHETER COMPARISON:  Ultrasound-guided paracentesis - 06/06/2016; CT abdomen pelvis - 05/20/2016 MEDICATIONS: Ancef 2 gm IV; Antibiotics were administered within an appropriate time frame prior to skin puncture. CONTRAST:  None. ANESTHESIA/SEDATION: Moderate (conscious) sedation was employed during this procedure. A total of Versed 2 mg and Fentanyl 50 mcg was administered intravenously. Moderate Sedation Time: 18  minutes. The patient's level of consciousness and vital signs were monitored continuously by radiology nursing throughout the procedure under my direct supervision. FLUOROSCOPY TIME:  42 seconds (8 mGy) COMPLICATIONS: None immediate. TECHNIQUE: Informed written consent was obtained from the patient after a discussion of the risks, benefits and alternatives to treatment. Questions regarding the procedure were encouraged and answered. A timeout was performed prior to the initiation of the procedure. Ultrasound scanning of the right lower abdominal quadrant demonstrates a small  amount of fluid within the right lower abdominal quadrant. The right lower abdomen was prepped and draped usual sterile fashion a sterile drape was applied, covering the operative table. Maximum barrier sterile technique with sterile gowns and gloves were used for the procedure. A timeout was performed prior to the initiation of the procedure. Local anesthesia was provided with 1% lidocaine with epinephrine. Under direct ultrasound guidance, an 18 gauge trocar needle was advanced into the peritoneal space with the right abdominal quadrant. Appropriate positioning was confirmed with the efflux of ascites. An Amplatz super stiff wire was advanced across the abdomen under intermittent fluoroscopic guidance. The access site was dilated over the Amplatz wire, allowing advancement of a peel-away sheath. The peritoneal catheter was tunneled in an antegrade fashion from a site along the right mid clavicular line to the access site and inserted through the peel-away sheath with tip ultimately terminating within midline of the lower abdomen. Several postprocedural spot abdominal radiographs were obtained. The catheter was connected to wall suction and approximately 900 cc of serous ascites was aspirated. The access site was closed with an interrupted 4-0 Vicryl suture, Dermabond and Steri-Strips. Dressings were placed. The patient tolerated procedure well without immediate postprocedural complication. FINDINGS: After successful ultrasound and fluoroscopic guided peritoneal drainage catheter placement, the right lower quadrant abdominal approach drainage catheter terminates within the midline of the lower abdomen Following tunneled peritoneal drainage catheter placement, approximately 900 cc of serous fluid was aspirated. IMPRESSION: 1. Successful ultrasound and fluoroscopic guided placement of a tunneled peritoneal drainage catheter. 2. Successful aspiration of 900 cc of serous ascites following tunneled peritoneal drainage  catheter placement. Electronically Signed   By: Sandi Mariscal M.D.   On: 06/08/2016 16:47   Ir US Guide Vasc Access Right  Result Date: 05/24/2016 CLINICAL DATA:  PERITONEAL CARCINOMA WITH ASCITES, ACCESS FOR CHEMO EXAM: RIGHT INTERNAL JUGULAR SINGLE LUMEN POWER PORT CATHETER INSERTION Date:  12/20/201712/20/2017 2:46 pm Radiologist:  M. Daryll Brod, MD Guidance:  Ultrasound and fluoroscopic MEDICATIONS: 2 g Ancef; The antibiotic was administered within an appropriate time interval prior to skin puncture. ANESTHESIA/SEDATION: Versed 2.0 mg IV; Fentanyl 100 mcg IV; Moderate Sedation Time:  28 minutes The patient was continuously monitored during the procedure by the interventional radiology nurse under my direct supervision. FLUOROSCOPY TIME:  , 30 seconds (4 mGy) COMPLICATIONS: None immediate. CONTRAST:  None. PROCEDURE: Informed consent was obtained from the patient following explanation of the procedure, risks, benefits and alternatives. The patient understands, agrees and consents for the procedure. All questions were addressed. A time out was performed. Maximal barrier sterile technique utilized including caps, mask, sterile gowns, sterile gloves, large sterile drape, hand hygiene, and 2% chlorhexidine scrub. Under sterile conditions and local anesthesia, right internal jugular micropuncture venous access was performed. Access was performed with ultrasound. Images were obtained for documentation. A guide wire was inserted followed by a transitional dilator. This allowed insertion of a guide wire and catheter into the IVC. Measurements were obtained from the SVC / RA junction back to  the right IJ venotomy site. In the right infraclavicular chest, a subcutaneous pocket was created over the second anterior rib. This was done under sterile conditions and local anesthesia. 1% lidocaine with epinephrine was utilized for this. A 2.5 cm incision was made in the skin. Blunt dissection was performed to create a  subcutaneous pocket over the right pectoralis major muscle. The pocket was flushed with saline vigorously. There was adequate hemostasis. The port catheter was assembled and checked for leakage. The port catheter was secured in the pocket with two retention sutures. The tubing was tunneled subcutaneously to the right venotomy site and inserted into the SVC/RA junction through a valved peel-away sheath. Position was confirmed with fluoroscopy. Images were obtained for documentation. The patient tolerated the procedure well. No immediate complications. Incisions were closed in a two layer fashion with 4 - 0 Vicryl suture. Dermabond was applied to the skin. The port catheter was accessed, blood was aspirated followed by saline and heparin flushes. Needle was removed. A dry sterile dressing was applied. IMPRESSION: Ultrasound and fluoroscopically guided right internal jugular single lumen power port catheter insertion. Tip in the SVC/RA junction. Catheter ready for use. Electronically Signed   By: Jerilynn Mages.  Shick M.D.   On: 05/24/2016 14:59   Dg Chest Port 1v Same Day  Result Date: 06/05/2016 CLINICAL DATA:  Shortness of Breath EXAM: PORTABLE CHEST 1 VIEW COMPARISON:  None. FINDINGS: Right Port-A-Cath tip is in the lower SVC. NG tube enters the stomach. Heart is normal size. Low lung volumes. Right perihilar and left base atelectasis. No effusions. No acute bony abnormality. IMPRESSION: Areas of atelectasis bilaterally.  Low lung volumes. Electronically Signed   By: Rolm Baptise M.D.   On: 06/05/2016 14:27   Dg Abd 2 Views  Result Date: 05/31/2016 CLINICAL DATA:  Abdominal bloating. History of ovarian carcinoma. Small bowel obstruction. EXAM: ABDOMEN - 2 VIEW COMPARISON:  May 30, 2016 FINDINGS: Supine and upright images obtained. Nasogastric tube tip and side port are in the stomach. There are multiple loops of dilated small bowel multiple air-fluid levels. Minimal areas seen in the rectum. No free air is  evident. IMPRESSION: Bowel gas pattern appearance is consistent with small bowel obstruction. No evident free air. Nasogastric tube tip and side port in stomach. Appearance is similar to 1 day prior. Electronically Signed   By: Lowella Grip III M.D.   On: 05/31/2016 09:33   Dg Abd 2 Views  Result Date: 05/30/2016 CLINICAL DATA:  Followup of small bowel obstruction. Nasogastric tube in place. EXAM: ABDOMEN - 2 VIEW COMPARISON:  1 day prior FINDINGS: Supine and right-sided decubitus views. The right-sided decubitus view demonstrates no free intraperitoneal air. Supine view demonstrates a nasogastric terminating at the body of the stomach. Gaseous distension of small bowel loops. Example loop in the mid abdomen measures 5.7 cm today versus 7.1 cm on the prior. Paucity of colonic gas. Minimal rectal gas. IMPRESSION: Improvement in high-grade small bowel obstruction. Electronically Signed   By: Abigail Miyamoto M.D.   On: 05/30/2016 08:47   Dg Abd 2 Views  Result Date: 05/29/2016 CLINICAL DATA:  History of small bowel obstruction and abdominal distension. Diagnosed with ovarian cancer 1 year ago EXAM: ABDOMEN - 2 VIEW COMPARISON:  05/28/2016 and CT 05/20/2016 FINDINGS: Exam demonstrates multiple air-filled dilated small bowel loops measuring 6.8 cm in diameter. Several air-fluid levels on the upright film. Findings are slightly worse. No free peritoneal air. Minimal air within the colon. Mild degenerate change of the spine  and hips. 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 Electronically Signed   By: Marin Olp M.D.   On: 05/29/2016 09:38   Dg Abd 2 Views  Result Date: 05/27/2016 CLINICAL DATA:  Follow-up small bowel obstruction. EXAM: ABDOMEN - 2 VIEW COMPARISON:  05/25/2016 FINDINGS: Nasogastric tube in the left upper abdomen and probably in the stomach.  Negative for free air on the upright view. Difficult to exclude patchy densities in the right lower lung. The dilated gas-filled loops of small bowel have resolved. There is a gas and stool in the colon. Stable oval-shaped sclerotic density in the left iliac bone. IMPRESSION: Dilated small bowel loops are no longer identified. Findings are compatible with a resolving or resolved small bowel obstruction. Vague densities at the right lung base and cannot exclude right lung disease. Electronically Signed   By: Markus Daft M.D.   On: 05/27/2016 13:01   Dg Abd Portable 1v  Result Date: 05/29/2016 CLINICAL DATA:  NG tube repositioning EXAM: PORTABLE ABDOMEN - 1 VIEW COMPARISON:  05/29/2016 1335 hours FINDINGS: NG tube tip is now on the fundus of the stomach. Dilated small bowel loops persist across the abdomen. There is also gas within colon. There is no obvious free intraperitoneal gas. IMPRESSION: NG tube tip is now all in the fundus of the stomach. Distended loops of small and large bowel persists. Electronically Signed   By: Marybelle Killings M.D.   On: 05/29/2016 15:55   Dg Abd Portable 1v  Result Date: 05/25/2016 CLINICAL DATA:  Initial evaluation for small bowel obstruction. EXAM: PORTABLE ABDOMEN - 1 VIEW COMPARISON:  Prior radiograph from 05/24/2016. FINDINGS: Enteric tube remains in place with side hole at or just beyond the level of the GE junction. Multiple dilated gas-filled loops of small bowel again seen within the abdomen. These measure up to 6.4 cm in diameter. Visualized loops of colon within normal limits. No definite free air on this limited supine view the abdomen. No soft tissue mass or abnormal calcification. Osseous structures unchanged. IMPRESSION: 1. Persistent gas filled distended loops of small bowel within the mid and lower abdomen, compatible with small bowel obstruction. Overall, the appearance is interval changed relative to 05/24/2016. 2. NG tube in place. Electronically Signed   By:  Jeannine Boga M.D.   On: 05/25/2016 06:07   Dg Abd Portable 1v  Result Date: 05/24/2016 CLINICAL DATA:  NG decompression of partial bowel obstruction/ileus. EXAM: PORTABLE ABDOMEN - 1 VIEW COMPARISON:  One-view abdomen 05/23/2016. FINDINGS: The side port of the NG tube appears to be at or just beyond the GE junction. Stomach is decompressed. Several loops of small small bowel in the central abdomen demonstrate increased distention compared to the based recent films. Some gas gas is again noted in the hepatic flexure of the colon. IMPRESSION: Increased distention of small bowel compatible with small bowel obstruction despite NG tube are placement. Electronically Signed   By: San Morelle M.D.   On: 05/24/2016 15:35   Dg Abd Portable 1v  Result Date: 05/23/2016 CLINICAL DATA:  NG tube placement. EXAM: PORTABLE ABDOMEN - 1 VIEW COMPARISON:  05/23/2016 at 0858 hours FINDINGS: An enteric tube has been placed and overlies the gastric body with tip and side hole well beyond the GE junction. The right side of the abdomen was incompletely imaged, however small bowel dilatation is similar to the study from earlier today. IMPRESSION: 1. Enteric  tube overlies the stomach. 2. Unchanged small bowel dilatation concerning for obstruction. Electronically Signed   By: Logan Bores M.D.   On: 05/23/2016 12:37   Dg Abd Portable 1v  Result Date: 06/05/2016 CLINICAL DATA:  Patient with shortness of breath. EXAM: PORTABLE ABDOMEN - 1 VIEW COMPARISON:  Abdominal radiograph 05/31/2016. FINDINGS: Enteric tube tip and side-port project over the left upper quadrant. Re- demonstrated gaseous distended loops of small bowel within the central abdomen measuring up to 5.5 cm. Gas is demonstrated within the colon. Supine evaluation limited for the detection of free intraperitoneal air. Lumbar spine degenerative changes. IMPRESSION: Re- demonstrated distended loops of small bowel within the central abdomen concerning for  obstruction, potentially partial given colonic gas. Electronically Signed   By: Lovey Newcomer M.D.   On: 06/05/2016 14:30   Ir Fluoro Guide Port Insertion Right  Result Date: 05/24/2016 CLINICAL DATA:  PERITONEAL CARCINOMA WITH ASCITES, ACCESS FOR CHEMO EXAM: RIGHT INTERNAL JUGULAR SINGLE LUMEN POWER PORT CATHETER INSERTION Date:  12/20/201712/20/2017 2:46 pm Radiologist:  M. Daryll Brod, MD Guidance:  Ultrasound and fluoroscopic MEDICATIONS: 2 g Ancef; The antibiotic was administered within an appropriate time interval prior to skin puncture. ANESTHESIA/SEDATION: Versed 2.0 mg IV; Fentanyl 100 mcg IV; Moderate Sedation Time:  28 minutes The patient was continuously monitored during the procedure by the interventional radiology nurse under my direct supervision. FLUOROSCOPY TIME:  , 30 seconds (4 mGy) COMPLICATIONS: None immediate. CONTRAST:  None. PROCEDURE: Informed consent was obtained from the patient following explanation of the procedure, risks, benefits and alternatives. The patient understands, agrees and consents for the procedure. All questions were addressed. A time out was performed. Maximal barrier sterile technique utilized including caps, mask, sterile gowns, sterile gloves, large sterile drape, hand hygiene, and 2% chlorhexidine scrub. Under sterile conditions and local anesthesia, right internal jugular micropuncture venous access was performed. Access was performed with ultrasound. Images were obtained for documentation. A guide wire was inserted followed by a transitional dilator. This allowed insertion of a guide wire and catheter into the IVC. Measurements were obtained from the SVC / RA junction back to the right IJ venotomy site. In the right infraclavicular chest, a subcutaneous pocket was created over the second anterior rib. This was done under sterile conditions and local anesthesia. 1% lidocaine with epinephrine was utilized for this. A 2.5 cm incision was made in the skin. Blunt  dissection was performed to create a subcutaneous pocket over the right pectoralis major muscle. The pocket was flushed with saline vigorously. There was adequate hemostasis. The port catheter was assembled and checked for leakage. The port catheter was secured in the pocket with two retention sutures. The tubing was tunneled subcutaneously to the right venotomy site and inserted into the SVC/RA junction through a valved peel-away sheath. Position was confirmed with fluoroscopy. Images were obtained for documentation. The patient tolerated the procedure well. No immediate complications. Incisions were closed in a two layer fashion with 4 - 0 Vicryl suture. Dermabond was applied to the skin. The port catheter was accessed, blood was aspirated followed by saline and heparin flushes. Needle was removed. A dry sterile dressing was applied. IMPRESSION: Ultrasound and fluoroscopically guided right internal jugular single lumen power port catheter insertion. Tip in the SVC/RA junction. Catheter ready for use. Electronically Signed   By: Jerilynn Mages.  Shick M.D.   On: 05/24/2016 14:59   ECHO 05/24/16 Study Conclusions  - Left ventricle: The cavity size was normal. Systolic function was   vigorous. The estimated  ejection fraction was in the range of 65%   to 70%. Wall motion was normal; there were no regional wall   motion abnormalities. Left ventricular diastolic function   parameters were normal. - Aortic valve: Transvalvular velocity was within the normal range.   There was no stenosis. There was no regurgitation. - Mitral valve: Calcified annulus. Transvalvular velocity was   within the normal range. There was no evidence for stenosis.   There was no regurgitation. - Right ventricle: The cavity size was normal. Wall thickness was   normal. Systolic function was normal. - Atrial septum: No defect or patent foramen ovale was identified   by color flow Doppler. - Tricuspid valve: There was trivial  regurgitation. - Pulmonary arteries: Systolic pressure was within the normal   range. PA peak pressure: 31 mm Hg (S).  Subjective: Seen and examined at bedside and daughter translated and patient was doing remarkably well. No abdominal Pain, Nausea or Vomiting. Had not had a bowel movement today but was given a suppository by Dr. Marko Plume. No other concerns or complaints and is ready to go home and will follow up with Dr. Marko Plume in a week.  Discharge Exam: Vitals:   06/13/16 0440 06/13/16 0926  BP: (!) 102/56 128/66  Pulse: 84 85  Resp: 16 16  Temp: 98.9 F (37.2 C) 98.9 F (37.2 C)   Vitals:   06/12/16 2022 06/13/16 0440 06/13/16 0502 06/13/16 0926  BP: 132/73 (!) 102/56  128/66  Pulse: 86 84  85  Resp: 18 16  16   Temp: 98.3 F (36.8 C) 98.9 F (37.2 C)  98.9 F (37.2 C)  TempSrc: Oral Oral  Oral  SpO2: 99% 97%  95%  Weight:   76 kg (167 lb 8.8 oz)   Height:       General: Pt is alert, awake, not in acute distress Cardiovascular: RRR, S1/S2 +, no rubs, no gallops Respiratory: CTA bilaterally, no wheezing, no rhonchi Abdominal: Soft, NT, Slightly distended with some ascites, bowel sounds +; Peritoneal drain in place Extremities: no edema, no cyanosis  The results of significant diagnostics from this hospitalization (including imaging, microbiology, ancillary and laboratory) are listed below for reference.    Microbiology: Recent Results (from the past 240 hour(s))  Culture, blood (routine x 2)     Status: None   Collection Time: 06/06/16  5:09 PM  Result Value Ref Range Status   Specimen Description BLOOD RIGHT HAND  Final   Special Requests IN PEDIATRIC BOTTLE 3CC  Final   Culture   Final    NO GROWTH 5 DAYS Performed at [facility]    Report Status 06/11/2016 FINAL  Final  Culture, blood (routine x 2)     Status: None   Collection Time: 06/06/16  5:20 PM  Result Value Ref Range Status   Specimen Description BLOOD LEFT ARM  Final   Special Requests  BOTTLES DRAWN AEROBIC AND ANAEROBIC [place]  Final   Culture   Final    NO GROWTH 5 DAYS Performed at [facility]    Report Status 06/11/2016 FINAL  Final  Culture, Urine     Status: Abnormal   Collection Time: 06/07/16 12:32 AM  Result Value Ref Range Status   Specimen Description URINE, RANDOM  Final   Special Requests NONE  Final   Culture MULTIPLE SPECIES PRESENT, SUGGEST RECOLLECTION (A)  Final   Report Status 06/08/2016 FINAL  Final     Labs: BNP (last 3 results) No results  for input(s): BNP in the last 8760 hours. Basic Metabolic Panel:  Recent Labs Lab 06/08/16 0423 06/09/16 0421 06/09/16 1951 06/10/16 1030 06/11/16 0423 06/13/16 0449  NA 131* 134* 131*  --  131* 134*  K 4.6 4.2 4.2  --  4.4 4.3  CL 98* 99* 97*  --  98* 100*  CO2 27 28 28   --  25 26  GLUCOSE 167* 147* 159*  --  148* 114*  BUN 14 15 14   --  14 13  CREATININE 0.55 0.60 0.50 0.52 0.60 0.62  CALCIUM 8.1* 8.2* 8.2*  --  8.0* 8.2*  MG 1.7 1.7  --  1.8 1.8 1.7  PHOS 3.6 3.4  --  3.4 3.3 3.4   Liver Function Tests:  Recent Labs Lab 06/08/16 0423 06/09/16 0421 06/09/16 1951 06/11/16 0423 06/13/16 0449  AST 16 23 22 19 19   ALT 13* 18 20 20 20   ALKPHOS 111 112 131* 123 119  BILITOT 0.3 0.3 0.3 0.3 0.4  PROT 5.3* 5.3* 6.1* 5.3* 5.4*  ALBUMIN 2.0* 2.1* 2.2* 2.2* 2.2*   No results for input(s): LIPASE, AMYLASE in the last 168 hours. No results for input(s): AMMONIA in the last 168 hours. CBC:  Recent Labs Lab 06/08/16 0423 06/09/16 0421 06/09/16 1951 06/11/16 0423 06/13/16 0449  WBC 7.6 6.9 6.7 5.3 5.2  NEUTROABS 6.0 5.7 4.9 3.6 3.2  HGB 9.6* 10.0* 10.0* 9.3* 9.1*  HCT 28.6* 29.9* 30.7* 28.8* 27.9*  MCV 86.1 85.9 84.3 84.5 85.8  PLT 483* 517* 529* 497* 525*   Cardiac Enzymes: No results for input(s): CKTOTAL, CKMB, CKMBINDEX, TROPONINI in the last 168 hours. BNP: Invalid input(s): POCBNP CBG:  Recent Labs Lab 06/06/16 1834 06/07/16 0028 06/07/16 0648  GLUCAP 118* 119*  137*   D-Dimer No results for input(s): DDIMER in the last 72 hours. Hgb A1c No results for input(s): HGBA1C in the last 72 hours. Lipid Profile No results for input(s): CHOL, HDL, LDLCALC, TRIG, CHOLHDL, LDLDIRECT in the last 72 hours. Thyroid function studies No results for input(s): TSH, T4TOTAL, T3FREE, THYROIDAB in the last 72 hours.  Invalid input(s): FREET3 Anemia work up No results for input(s): VITAMINB12, FOLATE, FERRITIN, TIBC, IRON, RETICCTPCT in the last 72 hours. Urinalysis    Component Value Date/Time   COLORURINE YELLOW 06/07/2016 0032   APPEARANCEUR CLEAR 06/07/2016 0032   LABSPEC 1.008 06/07/2016 0032   PHURINE 6.0 06/07/2016 0032   GLUCOSEU NEGATIVE 06/07/2016 0032   HGBUR NEGATIVE 06/07/2016 0032   BILIRUBINUR NEGATIVE 06/07/2016 0032   KETONESUR NEGATIVE 06/07/2016 0032   PROTEINUR NEGATIVE 06/07/2016 0032   NITRITE NEGATIVE 06/07/2016 0032   LEUKOCYTESUR SMALL (A) 06/07/2016 0032   Sepsis Labs Invalid input(s): PROCALCITONIN,  WBC,  LACTICIDVEN Microbiology Recent Results (from the past 240 hour(s))  Culture, blood (routine x 2)     Status: None   Collection Time: 06/06/16  5:09 PM  Result Value Ref Range Status   Specimen Description BLOOD RIGHT HAND  Final   Special Requests IN PEDIATRIC BOTTLE 3CC  Final   Culture   Final    NO GROWTH 5 DAYS Performed at [facility]    Report Status 06/11/2016 FINAL  Final  Culture, blood (routine x 2)     Status: None   Collection Time: 06/06/16  5:20 PM  Result Value Ref Range Status   Specimen Description BLOOD LEFT ARM  Final   Special Requests BOTTLES DRAWN AEROBIC AND ANAEROBIC [place]  Final   Culture  Final    NO GROWTH 5 DAYS Performed at [facility]    Report Status 06/11/2016 FINAL  Final  Culture, Urine     Status: Abnormal   Collection Time: 06/07/16 12:32 AM  Result Value Ref Range Status   Specimen Description URINE, RANDOM  Final   Special Requests NONE  Final   Culture  MULTIPLE SPECIES PRESENT, SUGGEST RECOLLECTION (A)  Final   Report Status 06/08/2016 FINAL  Final   Time coordinating discharge: Over 30 minutes  SIGNED:  Kerney Elbe, DO [facility] 06/13/2016, 2:58 PM Pager [phone]  If 7PM-7AM, please contact night-coverage [email or web address] Password [email or web address]

## 2016-06-13 NOTE — Progress Notes (Signed)
Pt daughter provided with a [place] packet and their resources were explained to her. She was encouraged to make pt and appointment to establish a PCP.  Marney Doctor RN,BSN,NCM [phone]

## 2016-06-13 NOTE — Progress Notes (Signed)
Pleurex drainage kit order form signed by MD and faxed to [place]  Phone: [phone] and Fax:  [phone]. Pt daughter aware that [facility] will contact her prior to sending more drainage kits. Per RN, pt granddaughter was taught how to drain Pleurex catheter.  CM will continue to follow. Marney Doctor RN,BSN,NCM [phone]

## 2016-06-14 MED FILL — UNJURY CHICKEN SOUP: 9 days supply | Qty: 459 | Fill #0

## 2016-06-16 ENCOUNTER — Telehealth: Payer: Self-pay | Admitting: Oncology

## 2016-06-16 NOTE — Telephone Encounter (Signed)
sw pt dtr to confirm 1/15 appts per LOS. dtr will be with pt at appts to interpret but requested on anyway due to [place]

## 2016-06-18 ENCOUNTER — Other Ambulatory Visit: Payer: Self-pay | Admitting: Oncology

## 2016-06-18 DIAGNOSIS — C569 Malignant neoplasm of unspecified ovary: Secondary | ICD-10-CM

## 2016-06-19 ENCOUNTER — Ambulatory Visit: Payer: Self-pay

## 2016-06-19 ENCOUNTER — Encounter: Payer: Self-pay | Admitting: Oncology

## 2016-06-19 ENCOUNTER — Other Ambulatory Visit (HOSPITAL_BASED_OUTPATIENT_CLINIC_OR_DEPARTMENT_OTHER): Payer: Self-pay

## 2016-06-19 ENCOUNTER — Ambulatory Visit: Payer: Self-pay | Admitting: Nutrition

## 2016-06-19 ENCOUNTER — Other Ambulatory Visit: Payer: Self-pay

## 2016-06-19 ENCOUNTER — Ambulatory Visit (HOSPITAL_BASED_OUTPATIENT_CLINIC_OR_DEPARTMENT_OTHER): Payer: Self-pay | Admitting: Oncology

## 2016-06-19 VITALS — BP 123/66 | HR 96 | Temp 98.4°F | Resp 18 | Wt 165.2 lb

## 2016-06-19 DIAGNOSIS — D649 Anemia, unspecified: Secondary | ICD-10-CM

## 2016-06-19 DIAGNOSIS — Z95828 Presence of other vascular implants and grafts: Secondary | ICD-10-CM

## 2016-06-19 DIAGNOSIS — K5790 Diverticulosis of intestine, part unspecified, without perforation or abscess without bleeding: Secondary | ICD-10-CM

## 2016-06-19 DIAGNOSIS — C786 Secondary malignant neoplasm of retroperitoneum and peritoneum: Secondary | ICD-10-CM

## 2016-06-19 DIAGNOSIS — K5730 Diverticulosis of large intestine without perforation or abscess without bleeding: Secondary | ICD-10-CM

## 2016-06-19 DIAGNOSIS — C569 Malignant neoplasm of unspecified ovary: Secondary | ICD-10-CM

## 2016-06-19 DIAGNOSIS — R180 Malignant ascites: Secondary | ICD-10-CM

## 2016-06-19 DIAGNOSIS — I700 Atherosclerosis of aorta: Secondary | ICD-10-CM

## 2016-06-19 DIAGNOSIS — K56600 Partial intestinal obstruction, unspecified as to cause: Secondary | ICD-10-CM

## 2016-06-19 DIAGNOSIS — E440 Moderate protein-calorie malnutrition: Secondary | ICD-10-CM

## 2016-06-19 DIAGNOSIS — D500 Iron deficiency anemia secondary to blood loss (chronic): Secondary | ICD-10-CM

## 2016-06-19 DIAGNOSIS — R5381 Other malaise: Secondary | ICD-10-CM

## 2016-06-19 LAB — CBC WITH DIFFERENTIAL/PLATELET
BASO%: 0.7 % (ref 0.0–2.0)
Basophils Absolute: 0 10*3/uL (ref 0.0–0.1)
EOS%: 0.5 % (ref 0.0–7.0)
Eosinophils Absolute: 0 10*3/uL (ref 0.0–0.5)
HCT: 29.7 % — ABNORMAL LOW (ref 34.8–46.6)
HEMOGLOBIN: 9.9 g/dL — AB (ref 11.6–15.9)
LYMPH%: 26.3 % (ref 14.0–49.7)
MCH: 28.7 pg (ref 25.1–34.0)
MCHC: 33.4 g/dL (ref 31.5–36.0)
MCV: 86 fL (ref 79.5–101.0)
MONO#: 0.8 10*3/uL (ref 0.1–0.9)
MONO%: 13.4 % (ref 0.0–14.0)
NEUT%: 59.1 % (ref 38.4–76.8)
NEUTROS ABS: 3.4 10*3/uL (ref 1.5–6.5)
Platelets: 690 10*3/uL — ABNORMAL HIGH (ref 145–400)
RBC: 3.45 10*6/uL — ABNORMAL LOW (ref 3.70–5.45)
RDW: 14 % (ref 11.2–14.5)
WBC: 5.7 10*3/uL (ref 3.9–10.3)
lymph#: 1.5 10*3/uL (ref 0.9–3.3)

## 2016-06-19 LAB — COMPREHENSIVE METABOLIC PANEL
ALBUMIN: 2.6 g/dL — AB (ref 3.5–5.0)
ALT: 15 U/L (ref 0–55)
AST: 14 U/L (ref 5–34)
Alkaline Phosphatase: 102 U/L (ref 40–150)
Anion Gap: 9 mEq/L (ref 3–11)
BUN: 17.3 mg/dL (ref 7.0–26.0)
CO2: 25 mEq/L (ref 22–29)
Calcium: 9 mg/dL (ref 8.4–10.4)
Chloride: 101 mEq/L (ref 98–109)
Creatinine: 0.7 mg/dL (ref 0.6–1.1)
EGFR: 83 mL/min/{1.73_m2} — ABNORMAL LOW (ref >90)
GLUCOSE: 125 mg/dL (ref 70–140)
POTASSIUM: 3.8 meq/L (ref 3.5–5.1)
SODIUM: 135 meq/L — AB (ref 136–145)
TOTAL PROTEIN: 6.4 g/dL (ref 6.4–8.3)
Total Bilirubin: <0.22 mg/dL (ref 0.20–1.20)

## 2016-06-19 MED ORDER — HEPARIN SOD (PORK) LOCK FLUSH 100 UNIT/ML IV SOLN
500.0000 [IU] | Freq: Once | INTRAVENOUS | Status: DC | PRN
  Filled 2016-06-19: qty 5

## 2016-06-19 MED ORDER — SODIUM CHLORIDE 0.9% FLUSH
10.0000 mL | INTRAVENOUS | Status: DC | PRN
  Filled 2016-06-19: qty 10

## 2016-06-19 NOTE — Progress Notes (Signed)
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 Previous treatment was in [place]. She was found to have recurrent disease during hospitalization at [facility] 05-20-16 thru 06-13-16, complicated by partial bowel obstruction and large volume ascites. Plan is to continue chemotherapy in hopes of improving and controlling disease.  She does not have return visit scheduled with gyn oncology.   During recent hospitalization she had CT AP on 05-20-16 with carcinomatosis and large ascites.  [place] paracenteses were done for 2.9 liters on 12-18, 4 liters on 06-06-16 and 900 cc at placement of peritoneal drainage catheter on 06-08-16. Ascites was malignant ([id number]), consistent with serous carcinoma of ovarian primary; IHC did have patchy ER positivity. She had partial SBO during much of the hospitalization, required extended NG use, as well as short term TNA; she improved with octreotide infusion after first doxil. She had PAC placed by IR 05-24-16. Echocardiogram 05-24-16 EF 65-70%. She had first doxil on 05-30-16, tolerated well. She was seen in consultation by medical oncology, gyn oncology, general surgery, IR and palliative care, as well as other hospital support staff.   Patient is glad to be back at daughter's home. They have drained ascites once since DC, on 06-16-16 for 800 cc; patient does not feel that fluid has reaccumulated to uncomfortable degree yet. That drainage procedure was done slowly per daughter, tho still painful at completion and I have suggested stopping at 500 when done again. She continues clear liquid diet , including 4 Resource drinks daily and supplemental protein in clear liquids. She and daughter met with [facility] nutritionist today. She does not feel hungry on this amount of intake. Plan is to continue clear  liquids with these supplements until hopefully some further improvement from chemo, then very low residue diet. She is markedly lactose intolerant, so need to avoid usual "full" liquids. She vomited once after lying supine immediately after drinking fluids, otherwise no N/V. Bowels are moving daily with an OTC magnesium supplement. She is up during day, feels stronger. She denies SOB or cough, throat soreness from NG + doxil has resolved, no bleeding, no problems with PAC, is sleeping some better. No fever.  Remainder of 10 point Review of Systems negative.    ONCOLOGIC HISTORY Following information from patient/ family/ records from [place]: Patient had hysterectomy without oophorectomy in her 67s for benign indication, and was in her good health until July 2016, when she developed abdominal discomfort and bloating. In late 03-2015 she was found to have large pelvic mass and perihepatic ascites. CA 125 then was 359, with CEA of 3, CA 19-9 5.8 and alpha fetoprotein 2.3. She had exploratory laparotomy 04-19-15,  not done by gyn oncologist, with bilateral ovaried an tumors, 10 cm with capsule ruptured on right , smaller on left, peritoneal carcinomatosis. Washings were done, however accompanying information does not describe extent of debulking. She had some bowel obstruction post operatively, with NG used . Pathology reported cystic and solid left ovarian mass, 13 cm right ovarian mass, moderately differentiated tumor "tumor de celulas de la granulosa". 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 CT AP 05-2015 had ascites, carcinomatosis and multiple peritoneal implants. CA 125 on 05-24-15 baseline for chemo was 141. She received 3 cycles of taxol + carboplatin from 05-26-15 thru 07-07-15 (not clear if  avastin given those cycles), after which carboplatin was not available in [place]. She received taxol 175 mg/m2, CDDP and avastin for  3 additional cycles thru 10-07-15. Second look laparotomy was discussed, patient declined, and instead had 4 cycles avastin + taxol from 12-01-15 thru 02-02-16 (apparently no CDDP after 10-07-15 and no carboplatin after 07-07-15). CT CAP 02-24-16 reportedly had no evidence of malignancy, with some bullous emphysema, tortuous sigmoid colon with diverticulosis. . Recommendation was for maintenance avastin x 1 year, however patient then came to [place] to be with family. She had not established with any  physicians prior to hospitalization 05-20-16. CT AP 05-20-16 had large volume ascites, extensive peritoneal disease with omental thickening, trace bilateral pleural effusions, left colonic diverticulosis, bilateral inguinal hernias containing fat, post hysterectomy, no adnexal masses, no adenopathy. US paracentesis 05-22-16 for 2.9 liters of yellow fluid. Cytology adenocarcinoma withserous features, immunohistochemical stains confirm gyn. First doxil given 05-30-16, chosen due to side effect profile NOTE MAY STILL BE PLATINUM SENSITIVE by best information available, as above. She needed NG, was on TNA support, bowel obstruction seemed to improve with addition of octreotide infusion. By time of DC on 06-13-16, she was tolerating clear liquids including clear supplements (lactose intolerant).    Objective:  Vital signs in last 24 hours:  BP 123/66 (BP Location: Left Arm, Patient Position: Sitting)   Pulse 96   Temp 98.4 F (36.9 C) (Oral)   Resp 18   Wt 165 lb 3.2 oz (74.9 kg)   SpO2 99%   BMI 29.26 kg/m   Alert, oriented and appropriate, still looks weak but improved from last week in hospital. Ambulatory without assistance, able to get on and off exam table.  No alopecia  HEENT:PERRL, sclerae not icteric. Oral mucosa moist without lesions, posterior pharynx clear.  Neck supple. No JVD.  Lymphatics:no cervical,surpaclavicular adenopathy Resp: clear to auscultation and no dullness to percussion  bilaterally Cardio: regular rate and rhythm. No gallop. GI: abdomen soft, nontender, mildly distended not tight. Some bowel sounds. Peritoneal drain right mid abdomen insertion site fine, no tenderness, clean dressing.   Musculoskeletal/ Extremities: LE without pitting edema, cords, tenderness Neuro: no peripheral neuropathy, otherwise nonfocal Skin not as dry, without rash, ecchymosis, petechiae Portacath-without erythema or tenderness  Lab Results:  Results for orders placed or performed in visit on 06/19/16  CBC with Differential  Result Value Ref Range   WBC 5.7 3.9 - 10.3 10e3/uL   NEUT# 3.4 1.5 - 6.5 10e3/uL   HGB 9.9 (L) 11.6 - 15.9 g/dL   HCT 29.7 (L) 34.8 - 46.6 %   Platelets 690 (H) 145 - 400 10e3/uL   MCV 86.0 79.5 - 101.0 fL   MCH 28.7 25.1 - 34.0 pg   MCHC 33.4 31.5 - 36.0 g/dL   RBC 3.45 (L) 3.70 - 5.45 10e6/uL   RDW 14.0 11.2 - 14.5 %   lymph# 1.5 0.9 - 3.3 10e3/uL   MONO# 0.8 0.1 - 0.9 10e3/uL   Eosinophils Absolute 0.0 0.0 - 0.5 10e3/uL   Basophils Absolute 0.0 0.0 - 0.1 10e3/uL   NEUT% 59.1 38.4 - 76.8 %   LYMPH% 26.3 14.0 - 49.7 %   MONO% 13.4 0.0 - 14.0 %   EOS% 0.5 0.0 - 7.0 %   BASO% 0.7 0.0 - 2.0 %  Comprehensive metabolic panel  Result Value Ref Range   Sodium 135 (L) 136 - 145 mEq/L   Potassium 3.8 3.5 - 5.1 mEq/L   Chloride 101 98 - 109 mEq/L  CO2 25 22 - 29 mEq/L   Glucose 125 70 - 140 mg/dl   BUN 58.5 7.0 - 95.3 mg/dL   Creatinine 0.7 0.6 - 1.1 mg/dL   Total Bilirubin <7.96 0.20 - 1.20 mg/dL   Alkaline Phosphatase 102 40 - 150 U/L   AST 14 5 - 34 U/L   ALT 15 0 - 55 U/L   Total Protein 6.4 6.4 - 8.3 g/dL   Albumin 2.6 (L) 3.5 - 5.0 g/dL   Calcium 9.0 8.4 - 26.7 mg/dL   Anion Gap 9 3 - 11 mEq/L   EGFR 83 (L) >90 ml/min/1.73 m2   Labs reviewed with patient and daughter at visit  CA 125 available after visit 166 , this having been 249 prior to first doxil  Studies/Results:  No results found.  Medications: I have reviewed the  patient's current medications. She has not needed antiemetics or pain medication.  DISCUSSION We are encouraged that she is feeling progressively better. She will concentrate on good nutrition, increasing the protein powder to tid in addition to 4 Resource daily, and try to increase activity between now and second doxil next week. Will use oral ice with doxil due to some mucositis with first cycle.  Even tho likely she is not platinum resistant, she does seem clinically improved since first doxil, which she tolerated well including counts. Dr Duard Brady and I were not comfortable adding avastin to this doxil given bowel concerns recently.   If/ when treatment needs to change, Sherrill Raring may be reasonable, as she has not had [place] since 07-2015 (CDDP apparently thru 10-2015) or gemzar.   Situation also complicated as she does not have insurance. EMR lists "medicaid potential" ; hopefully [facility] financial counselors can clarify.  Patient understands that Dr Bertis Ruddy will be taking over her care as of Feb.     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 Hospitalized with partial bowel obstruction 05-20-16 thru 06-13-16. Not candidate for surgery including for the bowel obstruction per gyn onc and general surgery. Clinically some improvement and CA125 lower since first doxil 05-30-16. She wants continued attempt at improving situation, agrees with plan for #2 doxil next week.  May not be platinum resistant, with last CDDP 10-2015 and last carboplatin 07-07-15 by review of records from [place].  2. Partial small bowel obstruction: tolerating clears well now, bowels moving daily, no significant vomiting. Would be very cautious about advancing diet until clearly improved with chemo. Lactose intolerant so needs to strictly avoid milk products to lessen gas. Octreotide infusion  seemed helpful in hospital.  3. Nutrition: TNA used for short term support 05-31-16 thru 06-11-16. Marland Kitchen Needs as much protein as possible due to losses with malignant ascites. Moderate protein calorie malnutrition.  Appreciate [facility] dietician following 4.Anemia: likely from chemo, blood draws etc. No bleeding. Iron studies not done 1-8, will follow up outpatient. 5.Peritoneal drain in place by IR: Drain was placed when initially thought she would need decompression gastrostomy tube, which was not needed subsequently.  Family taught to use peritoneal drain in hospital as unable to have HH without any insurance; supplies given from hospital.  They understand to drain only when uncomfortable from significant ascites, hopefully not >1-2x weekly. If situation improves, would remove this drain.  6.social situation: recently moved from [place] to be with daughters in [place], family very supportive. Patient speaks Spanish. No insurance tho may be  medicaid eligible.  7..PAC placed by IR. Did not draw blood today, and apparently did not draw in hospital either. Infuses well.  8.deconditioning from present illness: had been walking several miles daily and was in very good physical condition prior to recent hospitalization. Tolerating more activity now 10. Echocardiogram with good EF 05-25-16, for doxil. Aortic atherosclerosis by CT, no symptoms 11. flu vaccine done 05-28-16 12.oral candida, oral and presumed esophageal mucositis from doxil resolved. 13.marked diverticulosis by CT, without evidence diverticulitis then.  14.Anemia likely multifactorial. Check iron studies with next labs and follow  15.nformation on advance directives given  All questions answered and they know to call if needed prior to next scheduled appointments. [facility] financial staff aware of situation. Chemo orders placed, continuing careplan begun in hospital. Time spent 30 min including >50% counseling and coordination of care. Cc Dr Denman George to  update    Evlyn Clines, MD   06/19/2016, 8:44 PM

## 2016-06-19 NOTE — Progress Notes (Signed)
76 year old female with recurrent ovarian cancer status post recent hospitalization.  Noted malignant ascites.  She is a patient of Dr. Marko Plume.  Past medical history was reviewed.  Medications include Maalox, magnesium, Zofran, Protonix, Phenergan, and Gas-X.  Labs include sodium 134, glucose 114, albumin 2.2, phosphorus 3.4, and magnesium 1.7.  Height: 5 feet 3 inches. Weight: 166.6 pounds. BMI: 29.51.  Estimated nutrition needs: 1750-1950 calories, 75-85 grams protein, 2 L fluid.  Patient is at risk for recurrent bowel obstructions and has been following a clear liquid diet secondary to M.D. order. Noted patient lactose intolerance. Reports she is drinking 3-4 boost breeze a day providing 250 cal and 9 g protein, each. She also drinks one ensure clear a day providing 180 cal and 8 g protein. In addition, patient is tolerating 2 Unjury chicken soups providing 100 cal, and 20 g protein, each. In addition, patient is trying to drink clear juices. Patient diagnosed with moderate protein calorie calorie malnutrition, during hospitalization.  Nutrition diagnosis:  Food and nutrition related knowledge deficit related to recurrent ovarian cancer as evidenced by need to follow clear liquid diet.  Intervention: Educated patient on clear liquid diet and allowed foods. Recommended patient continue following supplements: 4 resource breeze= 1000 cal and 36 g protein. 1 ensure clear = 180 cal and 8 g protein. 2 Unjury chicken soup = 200 cal and 40 grams protein. This will provide a minimum 1580 cal and 84 g protein. Educated patient on recipes for high-protein Jell-O. Provided samples and coupons. Recommended diet advancement as soon as safe. Questions were answered.  Teach back method used.  Contact information provided.  Monitoring, evaluation, goals:  Patient will tolerate clear liquid diet with advancement as tolerated.  Per M.D.  Next visit: To be scheduled as needed.     **Disclaimer: This note was dictated with voice recognition software. Similar sounding words can inadvertently be transcribed and this note may contain transcription errors which may not have been corrected upon publication of note.**

## 2016-06-20 ENCOUNTER — Telehealth: Payer: Self-pay

## 2016-06-20 LAB — CA 125: CANCER ANTIGEN (CA) 125: 166.6 U/mL — AB (ref 0.0–38.1)

## 2016-06-20 NOTE — Telephone Encounter (Signed)
-----   Message from Gordy Levan, MD sent at 06/20/2016  8:39 AM EST ----- Labs seen and need follow up   Please let daughter know the ca 125 marker has gotten lower (better) since first doxil. CA 125 was 166 on 1-15, compared with 249 when she went into hospital in Dec. No change in plan to continue clear liquid diet with 4 Boost daily and extra protein powder, continue to increase activity as tolerated, and second doxil next week.

## 2016-06-21 ENCOUNTER — Encounter: Payer: Self-pay | Admitting: Pharmacist

## 2016-06-21 DIAGNOSIS — D500 Iron deficiency anemia secondary to blood loss (chronic): Secondary | ICD-10-CM | POA: Insufficient documentation

## 2016-06-21 DIAGNOSIS — R5381 Other malaise: Secondary | ICD-10-CM | POA: Insufficient documentation

## 2016-06-21 DIAGNOSIS — E440 Moderate protein-calorie malnutrition: Secondary | ICD-10-CM | POA: Insufficient documentation

## 2016-06-21 DIAGNOSIS — I700 Atherosclerosis of aorta: Secondary | ICD-10-CM | POA: Insufficient documentation

## 2016-06-21 DIAGNOSIS — K5730 Diverticulosis of large intestine without perforation or abscess without bleeding: Secondary | ICD-10-CM | POA: Insufficient documentation

## 2016-06-21 NOTE — Telephone Encounter (Signed)
LM for daughter Audrey Garza to call back tomorrow to discuss lab results and diet as noted below by Dr. Marko Plume.

## 2016-06-22 ENCOUNTER — Other Ambulatory Visit: Payer: Self-pay | Admitting: Hematology and Oncology

## 2016-06-22 ENCOUNTER — Ambulatory Visit: Payer: Self-pay | Admitting: Oncology

## 2016-06-22 ENCOUNTER — Other Ambulatory Visit: Payer: Self-pay

## 2016-06-22 NOTE — Telephone Encounter (Signed)
Daughter called and message given. She is following her diet and increasing activity walking around the house.

## 2016-06-24 ENCOUNTER — Other Ambulatory Visit: Payer: Self-pay | Admitting: Oncology

## 2016-06-26 ENCOUNTER — Ambulatory Visit (HOSPITAL_BASED_OUTPATIENT_CLINIC_OR_DEPARTMENT_OTHER): Payer: Self-pay

## 2016-06-26 ENCOUNTER — Ambulatory Visit: Payer: Self-pay

## 2016-06-26 ENCOUNTER — Ambulatory Visit (HOSPITAL_BASED_OUTPATIENT_CLINIC_OR_DEPARTMENT_OTHER): Payer: Self-pay | Admitting: Oncology

## 2016-06-26 ENCOUNTER — Other Ambulatory Visit (HOSPITAL_BASED_OUTPATIENT_CLINIC_OR_DEPARTMENT_OTHER): Payer: Self-pay

## 2016-06-26 VITALS — BP 116/67 | HR 90 | Temp 97.8°F | Resp 17 | Ht 63.0 in | Wt 162.7 lb

## 2016-06-26 DIAGNOSIS — C786 Secondary malignant neoplasm of retroperitoneum and peritoneum: Secondary | ICD-10-CM

## 2016-06-26 DIAGNOSIS — C569 Malignant neoplasm of unspecified ovary: Secondary | ICD-10-CM

## 2016-06-26 DIAGNOSIS — K56600 Partial intestinal obstruction, unspecified as to cause: Secondary | ICD-10-CM

## 2016-06-26 DIAGNOSIS — D500 Iron deficiency anemia secondary to blood loss (chronic): Secondary | ICD-10-CM

## 2016-06-26 DIAGNOSIS — E440 Moderate protein-calorie malnutrition: Secondary | ICD-10-CM

## 2016-06-26 DIAGNOSIS — R180 Malignant ascites: Secondary | ICD-10-CM

## 2016-06-26 DIAGNOSIS — Z95828 Presence of other vascular implants and grafts: Secondary | ICD-10-CM

## 2016-06-26 DIAGNOSIS — Z5111 Encounter for antineoplastic chemotherapy: Secondary | ICD-10-CM

## 2016-06-26 DIAGNOSIS — K5730 Diverticulosis of large intestine without perforation or abscess without bleeding: Secondary | ICD-10-CM

## 2016-06-26 DIAGNOSIS — D509 Iron deficiency anemia, unspecified: Secondary | ICD-10-CM

## 2016-06-26 LAB — CBC WITH DIFFERENTIAL/PLATELET
BASO%: 0.1 % (ref 0.0–2.0)
BASOS ABS: 0 10*3/uL (ref 0.0–0.1)
EOS%: 0.5 % (ref 0.0–7.0)
Eosinophils Absolute: 0 10*3/uL (ref 0.0–0.5)
HEMATOCRIT: 34.2 % — AB (ref 34.8–46.6)
HGB: 11.3 g/dL — ABNORMAL LOW (ref 11.6–15.9)
LYMPH%: 23.8 % (ref 14.0–49.7)
MCH: 28.2 pg (ref 25.1–34.0)
MCHC: 33 g/dL (ref 31.5–36.0)
MCV: 85.3 fL (ref 79.5–101.0)
MONO#: 0.9 10*3/uL (ref 0.1–0.9)
MONO%: 12.6 % (ref 0.0–14.0)
NEUT#: 4.4 10*3/uL (ref 1.5–6.5)
NEUT%: 63 % (ref 38.4–76.8)
PLATELETS: 584 10*3/uL — AB (ref 145–400)
RBC: 4.01 10*6/uL (ref 3.70–5.45)
RDW: 14.3 % (ref 11.2–14.5)
WBC: 6.9 10*3/uL (ref 3.9–10.3)
lymph#: 1.7 10*3/uL (ref 0.9–3.3)

## 2016-06-26 LAB — COMPREHENSIVE METABOLIC PANEL
ALT: 9 U/L (ref 0–55)
ANION GAP: 9 meq/L (ref 3–11)
AST: 11 U/L (ref 5–34)
Albumin: 3 g/dL — ABNORMAL LOW (ref 3.5–5.0)
Alkaline Phosphatase: 83 U/L (ref 40–150)
BUN: 19.9 mg/dL (ref 7.0–26.0)
CALCIUM: 9.8 mg/dL (ref 8.4–10.4)
CHLORIDE: 102 meq/L (ref 98–109)
CO2: 26 mEq/L (ref 22–29)
Creatinine: 0.8 mg/dL (ref 0.6–1.1)
EGFR: 72 mL/min/{1.73_m2} — AB (ref >90)
Glucose: 118 mg/dl (ref 70–140)
POTASSIUM: 4.3 meq/L (ref 3.5–5.1)
Sodium: 137 mEq/L (ref 136–145)
Total Bilirubin: 0.28 mg/dL (ref 0.20–1.20)
Total Protein: 7.1 g/dL (ref 6.4–8.3)

## 2016-06-26 LAB — IRON AND TIBC
%SAT: 12 % — AB (ref 21–57)
IRON: 29 ug/dL — AB (ref 41–142)
TIBC: 243 ug/dL (ref 236–444)
UIBC: 213 ug/dL (ref 120–384)

## 2016-06-26 MED ORDER — DEXAMETHASONE SODIUM PHOSPHATE 10 MG/ML IJ SOLN
INTRAMUSCULAR | Status: AC
  Filled 2016-06-26: qty 1

## 2016-06-26 MED ORDER — SODIUM CHLORIDE 0.9% FLUSH
10.0000 mL | INTRAVENOUS | Status: DC | PRN
  Administered 2016-06-26: 10 mL
  Filled 2016-06-26: qty 10

## 2016-06-26 MED ORDER — DOXORUBICIN HCL LIPOSOMAL CHEMO INJECTION 2 MG/ML
37.0000 mg/m2 | Freq: Once | INTRAVENOUS | Status: AC
  Administered 2016-06-26: 70 mg via INTRAVENOUS
  Filled 2016-06-26: qty 25

## 2016-06-26 MED ORDER — HEPARIN SOD (PORK) LOCK FLUSH 100 UNIT/ML IV SOLN
500.0000 [IU] | Freq: Once | INTRAVENOUS | Status: AC | PRN
  Administered 2016-06-26: 500 [IU]
  Filled 2016-06-26: qty 5

## 2016-06-26 MED ORDER — DEXTROSE 5 % IV SOLN
Freq: Once | INTRAVENOUS | Status: AC
  Administered 2016-06-26: 14:00:00 via INTRAVENOUS

## 2016-06-26 MED ORDER — ONDANSETRON HCL 8 MG PO TABS
ORAL_TABLET | ORAL | Status: AC
Start: 2016-06-26 — End: 2016-06-26
  Filled 2016-06-26: qty 1

## 2016-06-26 MED ORDER — ONDANSETRON HCL 8 MG PO TABS
8.0000 mg | ORAL_TABLET | Freq: Once | ORAL | Status: AC
  Administered 2016-06-26: 8 mg via ORAL

## 2016-06-26 MED ORDER — DEXAMETHASONE SODIUM PHOSPHATE 10 MG/ML IJ SOLN
10.0000 mg | Freq: Once | INTRAMUSCULAR | Status: AC
  Administered 2016-06-26: 10 mg via INTRAVENOUS

## 2016-06-26 NOTE — Patient Instructions (Signed)
[facility] Discharge Instructions for Patients Receiving Chemotherapy  Today you received the following chemotherapy agents Doxil (doxorubicin liposomal)  To help prevent nausea and vomiting after your treatment, we encourage you to take your nausea medication as prescribed   If you develop nausea and vomiting that is not controlled by your nausea medication, call the clinic.   BELOW ARE SYMPTOMS THAT SHOULD BE REPORTED IMMEDIATELY:  *FEVER GREATER THAN 100.5 F  *CHILLS WITH OR WITHOUT FEVER  NAUSEA AND VOMITING THAT IS NOT CONTROLLED WITH YOUR NAUSEA MEDICATION  *UNUSUAL SHORTNESS OF BREATH  *UNUSUAL BRUISING OR BLEEDING  TENDERNESS IN MOUTH AND THROAT WITH OR WITHOUT PRESENCE OF ULCERS  *URINARY PROBLEMS  *BOWEL PROBLEMS  UNUSUAL RASH Items with * indicate a potential emergency and should be followed up as soon as possible.  Feel free to call the clinic you have any questions or concerns. The clinic phone number is [phone].  Please show the [place] at check-in to the Emergency Department and triage nurse.    Doxorubicin Liposomal injection What is this medicine? LIPOSOMAL DOXORUBICIN (LIP oh som al dox oh ROO bi sin) is a chemotherapy drug. This medicine is used to treat many kinds of cancer like Kaposi's sarcoma, multiple myeloma, and ovarian cancer. This medicine may be used for other purposes; ask your health care provider or pharmacist if you have questions. COMMON BRAND NAME(S): Doxil, Lipodox What should I tell my health care provider before I take this medicine? They need to know if you have any of these conditions: -blood disorders -heart disease -infection (especially a virus infection such as chickenpox, cold sores, or herpes) -liver disease -recent or ongoing radiation therapy -an unusual or allergic reaction to doxorubicin, other chemotherapy agents, soybeans, other medicines, foods, dyes, or preservatives -pregnant or  trying to get pregnant -breast-feeding How should I use this medicine? This drug is given as an infusion into a vein. It is administered in a hospital or clinic by a specially trained health care professional. If you have pain, swelling, burning or any unusual feeling around the site of your injection, tell your health care professional right away. Talk to your pediatrician regarding the use of this medicine in children. Special care may be needed. Overdosage: If you think you have taken too much of this medicine contact a poison control center or emergency room at once. NOTE: This medicine is only for you. Do not share this medicine with others. What if I miss a dose? It is important not to miss your dose. Call your doctor or health care professional if you are unable to keep an appointment. What may interact with this medicine? Do not take this medicine with any of the following medications: -zidovudine This medicine may also interact with the following medications: -medicines to increase blood counts like filgrastim, pegfilgrastim, sargramostim -vaccines Talk to your doctor or health care professional before taking any of these medicines: -acetaminophen -aspirin -ibuprofen -ketoprofen -naproxen This list may not describe all possible interactions. Give your health care provider a list of all the medicines, herbs, non-prescription drugs, or dietary supplements you use. Also tell them if you smoke, drink alcohol, or use illegal drugs. Some items may interact with your medicine. What should I watch for while using this medicine? Your condition will be monitored carefully while you are receiving this medicine. You will need important blood work done while you are taking this medicine. This drug may make you feel generally unwell. This is not uncommon,  as chemotherapy can affect healthy cells as well as cancer cells. Report any side effects. Continue your course of treatment even though you  feel ill unless your doctor tells you to stop. Your urine may turn orange-red for a few days after your dose. This is not blood. If your urine is dark or brown, call your doctor. In some cases, you may be given additional medicines to help with side effects. Follow all directions for their use. Call your doctor or health care professional for advice if you get a fever (100.5 degrees F or higher), chills or sore throat, or other symptoms of a cold or flu. Do not treat yourself. This drug decreases your body's ability to fight infections. Try to avoid being around people who are sick. This medicine may increase your risk to bruise or bleed. Call your doctor or health care professional if you notice any unusual bleeding. Be careful brushing and flossing your teeth or using a toothpick because you may get an infection or bleed more easily. If you have any dental work done, tell your dentist you are receiving this medicine. Avoid taking products that contain aspirin, acetaminophen, ibuprofen, naproxen, or ketoprofen unless instructed by your doctor. These medicines may hide a fever. Men and women of childbearing age should use effective birth control methods while using taking this medicine. Do not become pregnant while taking this medicine. There is a potential for serious side effects to an unborn child. Talk to your health care professional or pharmacist for more information. Do not breast-feed an infant while taking this medicine. Talk to your doctor about your risk of cancer. You may be more at risk for certain types of cancers if you take this medicine. What side effects may I notice from receiving this medicine? Side effects that you should report to your doctor or health care professional as soon as possible: -allergic reactions like skin rash, itching or hives, swelling of the face, lips, or tongue -low blood counts - this medicine may decrease the number of white blood cells, red blood cells and  platelets. You may be at increased risk for infections and bleeding. -signs of hand-foot syndrome - tingling or burning, redness, flaking, swelling, small blisters, or small sores on the palms of your hands or the soles of your feet -signs of infection - fever or chills, cough, sore throat, pain or difficulty passing urine -signs of decreased platelets or bleeding - bruising, pinpoint red spots on the skin, black, tarry stools, blood in the urine -signs of decreased red blood cells - unusually weak or tired, fainting spells, lightheadedness -back pain, chills, facial flushing, fever, headache, tightness in the chest or throat during the infusion -breathing problems -chest pain -fast, irregular heartbeat -mouth pain, redness, sores -pain, swelling, redness at site where injected -pain, tingling, numbness in the hands or feet -swelling of ankles, feet, or hands -vomiting Side effects that usually do not require medical attention (report to your doctor or health care professional if they continue or are bothersome): -diarrhea -hair loss -loss of appetite -nail discoloration or damage -nausea -red or watery eyes -red colored urine -stomach upset This list may not describe all possible side effects. Call your doctor for medical advice about side effects. You may report side effects to FDA at 1-800-FDA-1088. Where should I keep my medicine? This drug is given in a hospital or clinic and will not be stored at home. NOTE: This sheet is a summary. It may not cover all possible information. If  you have questions about this medicine, talk to your doctor, pharmacist, or health care provider.  2017 Elsevier/Gold Standard (2012-02-09 10:12:56)

## 2016-06-26 NOTE — Progress Notes (Signed)
Labs drawn peripherally

## 2016-06-26 NOTE — Progress Notes (Signed)
OFFICE PROGRESS NOTE   June 26, 2016   Physicians:  Rossi/ [place], [place]  INTERVAL HISTORY:   Patient is seen, together with daughter, in continuing attention to recurrent ovarian cancer, having begun doxil 05-30-16 while hospitalized with related partial bowel obstruction and large volume malignant ascites. She will have cycle 2 doxil today.   She has peritoneal drain by IR, which may not be needed much longer, as ascites has improved considerably. She was seen in hospital by Drs Denman George and Alycia Rossetti.  Patient and daughter report that she is feeling progressively better. She continues clear liquids, including 4 Resource supplements daily, but has had no nausea or vomiting and bowels are moving with soft stools daily. She is now requesting po's, previously only taking these as daughter would insist. NOTE LACTOSE INTOLERANT.  She has not felt uncomfortable as with ascites, still tried to drain the peritoneal catheter on 1-21, with no fluid return. Ascites last drained for 800 cc on 06-16-16.  She is increasing activity in home, up thru day and walking in house. She denies SOB, cough, abdominal or pelvic pain, bleeding, LE swelling, fever or symptoms of infection, problems with PAC. She is sleeping well. No mucositis or esophagitis symptoms, tho had these post first doxil and with NG.  Remainder of 14 point Review of Systems negative.   PAC by IR 05-24-16 Peritoneal drain by IR 06-08-16 Flu vaccine 05-28-16 CA 125 on 05-21-16   249 Echocardiogram 05-24-16  EF 65-70% Ascites 05-2016 adenocarcinoma, serous features, patchy ER+  Paracenteses:   05-22-16   2.9 liters 06-06-16   4 liters 06-08-16   900cc (some amount 06-13-16) 06-16-16   800cc  ONCOLOGIC HISTORY Following information from patient/ family/ records from [place]: Patient had hysterectomy without oophorectomy in her 60s for benign indication, and was in her good health until July 2016, when she developed abdominal discomfort and bloating.  In late 03-2015 she was found to have large pelvic mass and perihepatic ascites. CA 125 then was 359, with CEA of 3, CA 19-9 5.8 and alpha fetoprotein 2.3. She had exploratory laparotomy 04-19-15,  not done by gyn oncologist, with bilateral ovaried an tumors, 10 cm with capsule ruptured on right , smaller on left, peritoneal carcinomatosis. Washings were done, however accompanying information does not describe extent of debulking. She had some bowel obstruction post operatively, with NG used . Pathology reported cystic and solid left ovarian mass, 13 cm right ovarian mass, moderately differentiated tumor "tumor de celulas de la granulosa". 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 CT AP 05-2015 had ascites, carcinomatosis and multiple peritoneal implants. CA 125 on 05-24-15 baseline for chemo was 141. She received 3 cycles of taxol + carboplatin from 05-26-15 thru 07-07-15 (not clear if avastin given those cycles), after which carboplatin was not available in [place]. She received taxol 175 mg/m2, CDDP and avastin for 3 additional cycles thru 10-07-15. Second look laparotomy was discussed, patient declined, and instead had 4 cycles avastin + taxol from 12-01-15 thru 02-02-16 (apparently no CDDP after 10-07-15 and no carboplatin after 07-07-15). CT CAP 02-24-16 reportedly had no evidence of malignancy, with some bullous emphysema, tortuous sigmoid colon with diverticulosis. . Recommendation was for maintenance avastin x 1 year, however patient then came to [place] to be with family.  She had not established with any [place] physicians prior to hospitalization 05-20-16 with bowel obstruction and ascites. CT AP 05-20-16 had large volume ascites, extensive peritoneal disease with omental  thickening, trace bilateral pleural effusions, left colonic diverticulosis, bilateral inguinal hernias containing fat, post hysterectomy, no adnexal masses, no adenopathy. [place]  paracenteses were done for 2.9 liters on 12-18, 4 liters on 06-06-16 and 900 cc at placement of peritoneal drainage catheter on 06-08-16. Ascites was malignant ([id number]), consistent with serous carcinoma of ovarian primary; IHC did have patchy ER positivity. She had partial SBO during much of the hospitalization, required extended NG use, as well as short term TNA; she improved with octreotide infusion after first doxil. She had PAC placed by IR 05-24-16. Echocardiogram 05-24-16 EF 65-70%. She had first doxil on 05-30-16, tolerated well.  Doxil was chosen due to side effect profile as she was very symptomatic; NOTE MAY STILL BE PLATINUM SENSITIVE by  information available from [place].  By time of DC on 06-13-16, she was tolerating clear liquids including clear supplements (lactose intolerant).  CA 125 was 249 on 05-21-16 and down to 166 on 06-19-16. She had cycle 2 doxil on 06-26-16.   Objective:  Vital signs in last 24 hours:  BP 116/67 (BP Location: Left Arm, Patient Position: Sitting)   Pulse 90   Temp 97.8 F (36.6 C) (Oral)   Resp 17   Ht '5\' 3"'$  (1.6 m)   Wt 162 lb 11.2 oz (73.8 kg)   SpO2 99%   BMI 28.82 kg/m  Weight down 2 lbs Alert, oriented and appropriate, looks better overall even compared to last week. Easily mobile in exam room and ambulatory without difficulty. Respirations not labored. No alopecia  HEENT:PERRL, sclerae not icteric. Oral mucosa moist without lesions, posterior pharynx clear.  Neck supple. No JVD.  Lymphatics:no supraclavicular adenopathy Resp: clear to auscultation bilaterally and normal percussion bilaterally Cardio: regular rate and rhythm. No gallop. GI: soft, nontender, minimally distended not increased, still somewhat full, no clear mass or organomegaly. Normal bowel sounds. Peritoneal catheter area not tender, dressing clean and intact, area evaluated earlier by RN Musculoskeletal/ Extremities: LE without pitting edema, cords, tenderness Neuro: no  peripheral neuropathy. Otherwise nonfocal. PSYCH appropriate mood and affect Skin without rash, ecchymosis, petechiae   Lab Results:  Results for orders placed or performed in visit on 06/26/16  CBC with Differential  Result Value Ref Range   WBC 6.9 3.9 - 10.3 10e3/uL   NEUT# 4.4 1.5 - 6.5 10e3/uL   HGB 11.3 (L) 11.6 - 15.9 g/dL   HCT 34.2 (L) 34.8 - 46.6 %   Platelets 584 (H) 145 - 400 10e3/uL   MCV 85.3 79.5 - 101.0 fL   MCH 28.2 25.1 - 34.0 pg   MCHC 33.0 31.5 - 36.0 g/dL   RBC 4.01 3.70 - 5.45 10e6/uL   RDW 14.3 11.2 - 14.5 %   lymph# 1.7 0.9 - 3.3 10e3/uL   MONO# 0.9 0.1 - 0.9 10e3/uL   Eosinophils Absolute 0.0 0.0 - 0.5 10e3/uL   Basophils Absolute 0.0 0.0 - 0.1 10e3/uL   NEUT% 63.0 38.4 - 76.8 %   LYMPH% 23.8 14.0 - 49.7 %   MONO% 12.6 0.0 - 14.0 %   EOS% 0.5 0.0 - 7.0 %   BASO% 0.1 0.0 - 2.0 %  Comprehensive metabolic panel  Result Value Ref Range   Sodium 137 136 - 145 mEq/L   Potassium 4.3 3.5 - 5.1 mEq/L   Chloride 102 98 - 109 mEq/L   CO2 26 22 - 29 mEq/L   Glucose 118 70 - 140 mg/dl   BUN 19.9 7.0 - 26.0 mg/dL   Creatinine 0.8  0.6 - 1.1 mg/dL   Total Bilirubin 0.78 0.20 - 1.20 mg/dL   Alkaline Phosphatase 83 40 - 150 U/L   AST 11 5 - 34 U/L   ALT 9 0 - 55 U/L   Total Protein 7.1 6.4 - 8.3 g/dL   Albumin 3.0 (L) 3.5 - 5.0 g/dL   Calcium 9.8 8.4 - 95.0 mg/dL   Anion Gap 9 3 - 11 mEq/L   EGFR 72 (L) >90 ml/min/1.73 m2  Iron and TIBC  Result Value Ref Range   Iron 29 (L) 41 - 142 ug/dL   TIBC 115 671 - 640 ug/dL   UIBC 890 975 - 295 ug/dL   %SAT 12 (L) 21 - 57 %   Iron studies available after visit.  CA 125 on 06-19-16    166, having been 249 on 05-21-16 as baseline for doxil 05-21-16  Studies/Results:  No results found.  Medications: I have reviewed the patient's current medications. She has antiemetics available if needed. Will discuss oral iron supplement at visit next week.  DISCUSSION Clinically improving, appears to be having some early  response to doxil, all in agreement with cycle 2 doxil today. Discussed oral ice and ice packs hands/ feet if tolerates during doxil. Reminded them of skin care including lots of lotion for the next week.  Tolerating clear liquids well, GI symptoms much improved. Lactose intolerant so cannot go to full liquids. OK to try low fiber solids, including white potato, white bread, pasta, melon, banana, fish.  Written information on low fiber diet given. [facility] dietician involved.  If no drainage from peritoneal catheter / no recurrent ascites by next week, likely best to remove the drain.   Oral iron to be discussed  Assessment/Plan:  1.recurrentovarian carcinoma: IIIC high grade serous, carcinomatosis with large volume malignant ascites recurrentwithin 4 months of last taxol + avastin  02-02-16. Hospitalized with partial bowel obstruction 05-20-16 thru 06-13-16. Not candidate for surgery including for the bowel obstruction per gyn onc and general surgery. Clinically improving and CA125 lower since first doxil 05-30-16.  Cycle 2 doxil today.   May not be platinum resistant, with last CDDP 10-2015 and last carboplatin 07-07-15 by review of records from [place].  2. Partial small bowel obstruction: tolerating clears well now, bowels moving daily, no significant vomiting. Lactose intolerant with gas and bloating, strictly avoiding milk products now. Will try carefully adding low fiber solids.  Octreotide infusion seemed helpful in hospital.  3. Nutrition: TNA used for short term support 05-31-16 thru 06-11-16. Marland Kitchen Needs as much protein as possible due to losses with malignant ascites. Moderate protein calorie malnutrition.  Continue 4 Resource supplements daily in addition to the low fiber foods. Appreciate [facility] dietician following 4.Anemia: iron deficiency and chemo, multiple blood draws etc. Will try oral iron if she can tolerate. 5.Peritoneal drain : placed by IR when initially thought she would need  decompression gastrostomy tube, but did not need G tube  Family managing the drain, unable to have HH without any insurance; supplies given from hospital. If nothing per drain in next week, and if no apparent ascites, will ask IR to remove. 6.social situation: recently moved from [place] to be with daughters in [place], family very supportive. Patient speaks Spanish. No insurance,  tho may be medicaid eligible.  7..PAC placed by IR. Apparently did not draw in hospital, and has not had blood return at [facility]. Infuses well.  8.deconditioning from present illness: had been walking several miles daily and was in  very good physical condition prior to recent hospitalization. Tolerating more activity now 10. Echocardiogram with good EF 05-25-16, for doxil. Aortic atherosclerosis by CT, no symptoms 11. flu vaccine done 05-28-16 12.oral candida, oral and presumed esophageal mucositis from doxil resolved. Will use oral ice during doxil infusion today.  13.marked diverticulosis by CT, without evidence diverticulitis then.  14.Anemia likely multifactorial. Check iron studies with next labs and follow  15.Information on advance directives given   All questions answered and patient/ daughter know to call if needed prior to visit with this MD 07-03-16, then will follow with Dr Alvy Bimler for medical oncology. Chemo orders confirmed. Time spent 25 min including >50% counseling and coordination of care. CC Dr Denman George  Evlyn Clines, MD   06/26/2016, 6:11 PM

## 2016-06-27 ENCOUNTER — Encounter: Payer: Self-pay | Admitting: Oncology

## 2016-06-27 NOTE — Progress Notes (Signed)
Faxed completed application to [employer] for assistance with Doxil. Fax received ok per confirmation sheet.

## 2016-06-28 ENCOUNTER — Encounter: Payer: Self-pay | Admitting: Oncology

## 2016-07-02 ENCOUNTER — Other Ambulatory Visit: Payer: Self-pay | Admitting: Oncology

## 2016-07-02 DIAGNOSIS — C569 Malignant neoplasm of unspecified ovary: Secondary | ICD-10-CM

## 2016-07-03 ENCOUNTER — Ambulatory Visit (HOSPITAL_BASED_OUTPATIENT_CLINIC_OR_DEPARTMENT_OTHER): Payer: Self-pay | Admitting: Oncology

## 2016-07-03 ENCOUNTER — Other Ambulatory Visit (HOSPITAL_BASED_OUTPATIENT_CLINIC_OR_DEPARTMENT_OTHER): Payer: Self-pay

## 2016-07-03 VITALS — BP 128/78 | HR 87 | Temp 98.6°F | Resp 16 | Ht 63.0 in | Wt 160.8 lb

## 2016-07-03 DIAGNOSIS — R180 Malignant ascites: Secondary | ICD-10-CM

## 2016-07-03 DIAGNOSIS — C569 Malignant neoplasm of unspecified ovary: Secondary | ICD-10-CM

## 2016-07-03 DIAGNOSIS — Z95828 Presence of other vascular implants and grafts: Secondary | ICD-10-CM

## 2016-07-03 DIAGNOSIS — D509 Iron deficiency anemia, unspecified: Secondary | ICD-10-CM

## 2016-07-03 DIAGNOSIS — E440 Moderate protein-calorie malnutrition: Secondary | ICD-10-CM

## 2016-07-03 DIAGNOSIS — D500 Iron deficiency anemia secondary to blood loss (chronic): Secondary | ICD-10-CM

## 2016-07-03 DIAGNOSIS — C786 Secondary malignant neoplasm of retroperitoneum and peritoneum: Secondary | ICD-10-CM

## 2016-07-03 DIAGNOSIS — R5381 Other malaise: Secondary | ICD-10-CM

## 2016-07-03 DIAGNOSIS — K219 Gastro-esophageal reflux disease without esophagitis: Secondary | ICD-10-CM

## 2016-07-03 DIAGNOSIS — K56600 Partial intestinal obstruction, unspecified as to cause: Secondary | ICD-10-CM

## 2016-07-03 LAB — COMPREHENSIVE METABOLIC PANEL
ALBUMIN: 3.2 g/dL — AB (ref 3.5–5.0)
ALK PHOS: 86 U/L (ref 40–150)
ALT: 9 U/L (ref 0–55)
AST: 12 U/L (ref 5–34)
Anion Gap: 10 mEq/L (ref 3–11)
BUN: 15.8 mg/dL (ref 7.0–26.0)
CO2: 26 mEq/L (ref 22–29)
Calcium: 10.1 mg/dL (ref 8.4–10.4)
Chloride: 98 mEq/L (ref 98–109)
Creatinine: 0.8 mg/dL (ref 0.6–1.1)
EGFR: 75 mL/min/{1.73_m2} — ABNORMAL LOW (ref >90)
GLUCOSE: 115 mg/dL (ref 70–140)
POTASSIUM: 4.9 meq/L (ref 3.5–5.1)
Sodium: 135 mEq/L — ABNORMAL LOW (ref 136–145)
Total Bilirubin: 0.31 mg/dL (ref 0.20–1.20)
Total Protein: 7.5 g/dL (ref 6.4–8.3)

## 2016-07-03 LAB — CBC WITH DIFFERENTIAL/PLATELET
BASO%: 0.3 % (ref 0.0–2.0)
BASOS ABS: 0 10*3/uL (ref 0.0–0.1)
EOS%: 0.5 % (ref 0.0–7.0)
Eosinophils Absolute: 0 10*3/uL (ref 0.0–0.5)
HCT: 35.1 % (ref 34.8–46.6)
HEMOGLOBIN: 11.3 g/dL — AB (ref 11.6–15.9)
LYMPH%: 23.6 % (ref 14.0–49.7)
MCH: 27.5 pg (ref 25.1–34.0)
MCHC: 32.2 g/dL (ref 31.5–36.0)
MCV: 85.4 fL (ref 79.5–101.0)
MONO#: 0.8 10*3/uL (ref 0.1–0.9)
MONO%: 10.4 % (ref 0.0–14.0)
NEUT#: 5 10*3/uL (ref 1.5–6.5)
NEUT%: 65.2 % (ref 38.4–76.8)
Platelets: 417 10*3/uL — ABNORMAL HIGH (ref 145–400)
RBC: 4.11 10*6/uL (ref 3.70–5.45)
RDW: 14.5 % (ref 11.2–14.5)
WBC: 7.7 10*3/uL (ref 3.9–10.3)
lymph#: 1.8 10*3/uL (ref 0.9–3.3)

## 2016-07-03 MED ORDER — PANTOPRAZOLE SODIUM 40 MG PO TBEC: 40.0000 mg | DELAYED_RELEASE_TABLET | Freq: Every day | ORAL | 1 refills | Status: DC

## 2016-07-03 NOTE — Progress Notes (Signed)
OFFICE PROGRESS NOTE   July 03, 2016   Physicians: Rossi/ Gehrig, interventional radiology  INTERVAL HISTORY:  Patient is seen, together with daughter, in continuing attention to recurrent ovarian carcinoma, having had cycle 2 doxil on 06-26-16. CA 125 decreased from 249 prior to start of doxil to 166 prior to cycle 2 doxil.  Treatment prior to 05-2016 was in [place] (see below). She presented here with malignant ascites and bowel obstruction 05-2016. She has peritoneal drain in, not stable to remove yet. She may not be platinum resistant if/ when treatment change needed.   Peritoneal drain had no drainage on 06-25-16 after a full week, however 800 cc drained when this was attempted the next time on 07-02-16. She did not notice abdominal distension or discomfort prior to drainage on 1-28; she also had no discomfort at completion of the drainage on 1-28. Patient had no problems with #2 doxil on 06-26-16, tho she has been somewhat more fatigued since treatment. Appetite is not good, unchanged, tho she continues to drink clear liquid supplements, use protein supplements and has tolerated white potato, pasta and fish in last week as diet was advanced beyond liquids. (Lactose intolerant). She has had no nausea or vomiting, and bowels are moving including some loose stools earlier this week; she will stop oral magnesium that she has used for years. She used oral ice with doxil infusion, no stomatitis or esophagitis. No skin irritation or palmar plantar dysesthesia. No increased SOB, cough, chest pain. No problems with PAC. No fever or symptoms of infection. PAC is fine. No fever or other symptoms of infection. She is up walking easily in home. She is able to sleep. Remainder of 14 point Review of Systems negative.       PAC by IR 05-24-16 Peritoneal drain by IR 06-08-16 Flu vaccine 05-28-16 CA 125 on 05-21-16   249 Echocardiogram 05-24-16  EF 65-70% Ascites 05-2016 adenocarcinoma, serous features,  patchy ER+   Paracenteses/ peritoneal drain   05-22-16   2.9 liters 06-06-16   4 liters 06-08-16   900cc 06-16-16   800cc 06-25-16   none 07-02-16   800cc   Patient and family prefer not to have interpreter. Family all fluent Spanish and [place], patient knows some [place]. Concern may be cost of interpreter (?)  ONCOLOGIC HISTORY Following information from patient/ family/ records from [place]: Patient had hysterectomy without oophorectomy in her 57s for benign indication, and wasin her good health until July 2016, when she developed abdominal discomfort and bloating. In late 03-2015 she was found to have large pelvic mass and perihepatic ascites. CA 125 then was 359, with CEA of 3, CA 19-9 5.8 and alpha fetoprotein 2.3. She had exploratory laparotomy 04-19-15, not done by gyn oncologist, with bilateral ovaried an tumors, 10 cm with capsule ruptured on right , smaller on left, peritoneal carcinomatosis. Washings were done, however accompanying information does not describe extent of debulking. She had some bowel obstruction post operatively, with NG used . Pathology reportedcystic and solid left ovarian mass, 13 cm right ovarian mass, moderately differentiated tumor "tumor de celulas de la granulosa". 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 CT AP 05-2015 had ascites, carcinomatosis and multiple peritoneal implants. CA 125 on 05-24-15 baseline for chemo was 141. She received 3 cycles of taxol + carboplatin from 05-26-15 thru 07-07-15 (not clear if avastin given those cycles), after which carboplatin was not available in [place]. She received taxol 175 mg/m2, CDDP and  avastin for 3 additional cycles thru 10-07-15. Second look laparotomy was discussed, patient declined, and instead had 4 cycles avastin + taxol from 12-01-15 thru 02-02-16 (apparentlyno CDDP after 10-07-15 and no carboplatin after 07-07-15). CT CAP 02-24-16 reportedly  had no evidence of malignancy, with some bullous emphysema, tortuous sigmoid colon with diverticulosis. . Recommendation was for maintenance avastin x 1 year, however patient then came to [place] to be with family.  She hadnot established with any [facility] physicians prior to hospitalization 05-20-16 with bowel obstruction and ascites.CT AP 05-20-16 had large volume ascites, extensive peritoneal disease with omental thickening, trace bilateral pleural effusions, left colonic diverticulosis, bilateral inguinal hernias containing fat, post hysterectomy, no adnexal masses, no adenopathy. [place] paracenteses were done for 2.9 liters on 12-18, 4 liters on 06-06-16 and 900 cc at placement of peritoneal drainage catheter on 06-08-16. Ascites was malignant ([id number]), consistent with serous carcinoma of ovarian primary; IHC did have patchy ER positivity. She had partial SBO during much of the hospitalization, required extended NG use, as well as short term TNA; she improved with octreotide infusion after first doxil. She had PAC placed by IR 05-24-16. Echocardiogram 05-24-16 EF 65-70%. She had first doxil on 05-30-16, tolerated well.  Doxil was chosen due to side effect profile as she was very symptomatic; NOTE MAY STILL BE PLATINUM SENSITIVE by  information available from [place].  By time of DC on 06-13-16, she was tolerating clear liquids including clear supplements (lactose intolerant).  CA 125 was 249 on 05-21-16 and down to 166 on 06-19-16. She had cycle 2 doxil on 06-26-16.  Objective:  Vital signs in last 24 hours:  BP 128/78 (BP Location: Left Arm, Patient Position: Sitting)   Pulse 87   Temp 98.6 F (37 C) (Oral)   Resp 16   Ht '5\' 3"'$  (1.6 m)   Wt 160 lb 12.8 oz (72.9 kg)   SpO2 96%   BMI 28.48 kg/m  Weight down ~ 1.5 lbs Alert, oriented and appropriate, looks somewhat fatigued but not in any discomfort, just delightful as always. Ambulatory without assistance, easily able to get on and off exam  table.  HEENT:PERRL, sclerae not icteric. Oral mucosa moist without lesions, posterior pharynx clear.  Neck supple. No JVD.  Lymphatics:no supraclavicular or inguinal adenopathy Resp: Respirations not labored, otherwise clear to auscultation bilaterally and normal percussion bilaterally Cardio: regular rate and rhythm. No gallop. GI: abdomen soft, nontender, not more distended, no discreet mass or organomegaly, no fluid wave now. Some normal bowel sounds. Surgical incision not remarkable. Musculoskeletal/ Extremities: LE without pitting edema, cords, tenderness Neuro: no peripheral neuropathy. Otherwise nonfocal. PSYCH appropriate mood and affect Skin no dryness or irritation, no rash, ecchymosis, petechiae. Palms not remarkable. Portacath-without erythema or tenderness Peritoneal drain no tenderness, dressing clean and intact, insertion site has been fine  Lab Results:  Results for orders placed or performed in visit on 07/03/16  CBC with Differential  Result Value Ref Range   WBC 7.7 3.9 - 10.3 10e3/uL   NEUT# 5.0 1.5 - 6.5 10e3/uL   HGB 11.3 (L) 11.6 - 15.9 g/dL   HCT 35.1 34.8 - 46.6 %   Platelets 417 (H) 145 - 400 10e3/uL   MCV 85.4 79.5 - 101.0 fL   MCH 27.5 25.1 - 34.0 pg   MCHC 32.2 31.5 - 36.0 g/dL   RBC 4.11 3.70 - 5.45 10e6/uL   RDW 14.5 11.2 - 14.5 %   lymph# 1.8 0.9 - 3.3 10e3/uL   MONO# 0.8 0.1 -  0.9 10e3/uL   Eosinophils Absolute 0.0 0.0 - 0.5 10e3/uL   Basophils Absolute 0.0 0.0 - 0.1 10e3/uL   NEUT% 65.2 38.4 - 76.8 %   LYMPH% 23.6 14.0 - 49.7 %   MONO% 10.4 0.0 - 14.0 %   EOS% 0.5 0.0 - 7.0 %   BASO% 0.3 0.0 - 2.0 %  Comprehensive metabolic panel  Result Value Ref Range   Sodium 135 (L) 136 - 145 mEq/L   Potassium 4.9 3.5 - 5.1 mEq/L   Chloride 98 98 - 109 mEq/L   CO2 26 22 - 29 mEq/L   Glucose 115 70 - 140 mg/dl   BUN 15.8 7.0 - 26.0 mg/dL   Creatinine 0.8 0.6 - 1.1 mg/dL   Total Bilirubin 0.31 0.20 - 1.20 mg/dL   Alkaline Phosphatase 86 40 - 150 U/L    AST 12 5 - 34 U/L   ALT 9 0 - 55 U/L   Total Protein 7.5 6.4 - 8.3 g/dL   Albumin 3.2 (L) 3.5 - 5.0 g/dL   Calcium 10.1 8.4 - 10.4 mg/dL   Anion Gap 10 3 - 11 mEq/L   EGFR 75 (L) >90 ml/min/1.73 m2   Labs reviewed at time of visit. From 06-26-16, serum iron 29 and %sat 12  Studies/Results:  No results found.  Medications: I have reviewed the patient's current medications. Hold OTC magnesium due to loose stools. Stop potassium Try MVI with iron. If tolerates, may be able to use oral iron as ferrous fumarate or other preparation.   DISCUSSION Meds as above. They will call if diarrhea continues off of magnesium.  She is still doing much better than at presentation in Dec, and has tolerated second doxil well so far.  Not clear to me if this is increased ascites with 800cc out on 07-02-16, , or if possibly drain just did not function correctly when no fluid obtained on 06-25-16. Note no home care agency involved, family taught use of the peritoneal drain and have been managing this well.  I have suggested they try draining again in 4-5 days, then ~ weekly. If ascites improves with continued chemo, would remove the drain.  (Note drain was originally placed when G tube placement was expected, but did not need the G tube).   Encouraged them to continue as good nutrition as possible, including the Resource supplements. OK to advance low fiber diet, avoid all milk products as those complicated bowel situation in hospital.  Reviewed skin care with doxil.  Patient and daughter are very appreciative of care, and are extremely compliant and extremely motivated to manage the recurrent cancer as best possible. She will see Dr Alvy Bimler with labs on 07-24-16.   Assessment/Plan:  1.recurrentovarian carcinoma: IIIC high grade serous, carcinomatosis with large volume malignant ascites recurrentwithin 4 months of last taxol + avastin 02-02-16. Hospitalized with partial bowel obstruction 05-20-16 thru  06-13-16. Not candidate for surgery including for the bowel obstruction per gyn onc and general surgery. Clinically improving and CA125 lower since first doxil 05-30-16.  Cycle 2 doxil given 06-26-16. Possibly increased ascites this week vs drain functioning differently, follow.    May not be platinum resistant, with last CDDP 10-2015 and last carboplatin 07-07-15 by review of records from [place].  2. Partial small bowel obstruction: no symptoms now, tolerating clears including Resource and some low fiber solid foods now, bowels moving daily, no vomiting. Lactose intolerant with gas and bloating, strictly avoiding milk products.   Octreotide infusion seemed helpful with  bowel obstruction in hospital.  3. Nutrition: TNA used for short term support 05-31-16 thru 06-11-16. Marland Kitchen Needs as much protein as possible due to losses with malignant ascites. Moderate protein calorie malnutrition. Continue 4 Resource supplements daily in addition to the low fiber foods. Appreciate [facility] dietician following 4.Anemia: iron deficiency and chemo, multiple blood draws etc. Will try oral iron if she can tolerate. 5.Peritoneal drain : placed by IR when initially thought she would need decompression gastrostomy tube, but did not need G tube Family managing the drain, unable to have [facility] without any insurance; supplies given from hospital. Hopefully ascites will improve and drain can be removed.  6.social situation: recently moved from [place] to be with daughters in [place], family very supportive. Patient speaks Spanish. No insurance,  tho may be medicaid eligible. Doxil assistance requested by [facility] financial advocate.  7..PAC placed by IR. Apparently did not draw in hospital, and has not had blood return at [facility]. Infuses well.  8.deconditioning from present illness: had been walking several miles daily and was in very good physical condition prior to Dec hospitalization. Tolerating more activity now 10. Echocardiogram with good EF  05-25-16, for doxil. Aortic atherosclerosis by CT, no symptoms 11. flu vaccine done 05-28-16 12.Oral and esophageal mucositis from cycle 1 doxil. None since using oral ice during #2 doxil infusion 13.marked diverticulosis by CT, without evidence diverticulitis then.  14.Information on advance directives given  Time spent 25 min including >50% counseling and coordination of care. Patient and daughter have had all questions answered and are in agreement with recommendations and plans, know to call if concerns prior to next appointment.    Evlyn Clines, MD   07/03/2016, 5:46 PM

## 2016-07-05 ENCOUNTER — Encounter: Payer: Self-pay | Admitting: Oncology

## 2016-07-05 DIAGNOSIS — C569 Malignant neoplasm of unspecified ovary: Secondary | ICD-10-CM | POA: Insufficient documentation

## 2016-07-06 ENCOUNTER — Encounter: Payer: Self-pay | Admitting: Hematology and Oncology

## 2016-07-06 NOTE — Progress Notes (Signed)
Received shipment information from [employer] on 07/04/16. Dr.Livesay signed on 07/05/16. Gave to [place] in pharmacy to have Arbie Cookey complete prescription portion and leave for me.

## 2016-07-12 ENCOUNTER — Encounter: Payer: Self-pay | Admitting: Oncology

## 2016-07-12 NOTE — Progress Notes (Signed)
Pt is eligible for up to one year of medication assistance w/ [employer] for Doxil.  [place] copy of letter to file in her book.

## 2016-07-20 ENCOUNTER — Other Ambulatory Visit: Payer: Self-pay | Admitting: *Deleted

## 2016-07-20 DIAGNOSIS — K219 Gastro-esophageal reflux disease without esophagitis: Secondary | ICD-10-CM

## 2016-07-20 MED ORDER — LIDOCAINE-PRILOCAINE 2.5-2.5 % EX CREA: TOPICAL_CREAM | CUTANEOUS | 0 refills | Status: DC

## 2016-07-20 MED ORDER — PANTOPRAZOLE SODIUM 40 MG PO TBEC: 40.0000 mg | DELAYED_RELEASE_TABLET | Freq: Every day | ORAL | 1 refills | Status: DC

## 2016-07-23 ENCOUNTER — Other Ambulatory Visit: Payer: Self-pay | Admitting: Hematology and Oncology

## 2016-07-23 DIAGNOSIS — C569 Malignant neoplasm of unspecified ovary: Secondary | ICD-10-CM

## 2016-07-24 ENCOUNTER — Ambulatory Visit: Payer: Self-pay

## 2016-07-24 ENCOUNTER — Ambulatory Visit (HOSPITAL_BASED_OUTPATIENT_CLINIC_OR_DEPARTMENT_OTHER): Payer: Self-pay | Admitting: Hematology and Oncology

## 2016-07-24 ENCOUNTER — Ambulatory Visit: Payer: Self-pay | Admitting: Nutrition

## 2016-07-24 ENCOUNTER — Telehealth: Payer: Self-pay | Admitting: *Deleted

## 2016-07-24 ENCOUNTER — Telehealth: Payer: Self-pay | Admitting: Hematology and Oncology

## 2016-07-24 ENCOUNTER — Encounter: Payer: Self-pay | Admitting: Hematology and Oncology

## 2016-07-24 ENCOUNTER — Ambulatory Visit (HOSPITAL_BASED_OUTPATIENT_CLINIC_OR_DEPARTMENT_OTHER): Payer: Self-pay

## 2016-07-24 ENCOUNTER — Other Ambulatory Visit (HOSPITAL_BASED_OUTPATIENT_CLINIC_OR_DEPARTMENT_OTHER): Payer: Self-pay

## 2016-07-24 VITALS — BP 110/64 | HR 90 | Temp 98.2°F | Resp 18 | Ht 63.0 in | Wt 160.9 lb

## 2016-07-24 DIAGNOSIS — K219 Gastro-esophageal reflux disease without esophagitis: Secondary | ICD-10-CM

## 2016-07-24 DIAGNOSIS — Z95828 Presence of other vascular implants and grafts: Secondary | ICD-10-CM

## 2016-07-24 DIAGNOSIS — D473 Essential (hemorrhagic) thrombocythemia: Secondary | ICD-10-CM

## 2016-07-24 DIAGNOSIS — C569 Malignant neoplasm of unspecified ovary: Secondary | ICD-10-CM

## 2016-07-24 DIAGNOSIS — R180 Malignant ascites: Secondary | ICD-10-CM

## 2016-07-24 DIAGNOSIS — C786 Secondary malignant neoplasm of retroperitoneum and peritoneum: Secondary | ICD-10-CM

## 2016-07-24 DIAGNOSIS — E440 Moderate protein-calorie malnutrition: Secondary | ICD-10-CM

## 2016-07-24 DIAGNOSIS — C801 Malignant (primary) neoplasm, unspecified: Secondary | ICD-10-CM

## 2016-07-24 DIAGNOSIS — D75839 Thrombocytosis, unspecified: Secondary | ICD-10-CM

## 2016-07-24 DIAGNOSIS — Z5111 Encounter for antineoplastic chemotherapy: Secondary | ICD-10-CM

## 2016-07-24 LAB — CBC WITH DIFFERENTIAL/PLATELET
BASO%: 1.1 % (ref 0.0–2.0)
BASOS ABS: 0 10*3/uL (ref 0.0–0.1)
EOS ABS: 0 10*3/uL (ref 0.0–0.5)
EOS%: 0.4 % (ref 0.0–7.0)
HCT: 35.5 % (ref 34.8–46.6)
HEMOGLOBIN: 11.8 g/dL (ref 11.6–15.9)
LYMPH%: 37.9 % (ref 14.0–49.7)
MCH: 28 pg (ref 25.1–34.0)
MCHC: 33.2 g/dL (ref 31.5–36.0)
MCV: 84.3 fL (ref 79.5–101.0)
MONO#: 0.8 10*3/uL (ref 0.1–0.9)
MONO%: 18.6 % — ABNORMAL HIGH (ref 0.0–14.0)
NEUT#: 1.9 10*3/uL (ref 1.5–6.5)
NEUT%: 42 % (ref 38.4–76.8)
Platelets: 408 10*3/uL — ABNORMAL HIGH (ref 145–400)
RBC: 4.21 10*6/uL (ref 3.70–5.45)
RDW: 16.4 % — ABNORMAL HIGH (ref 11.2–14.5)
WBC: 4.4 10*3/uL (ref 3.9–10.3)
lymph#: 1.7 10*3/uL (ref 0.9–3.3)

## 2016-07-24 LAB — COMPREHENSIVE METABOLIC PANEL
ALBUMIN: 3.4 g/dL — AB (ref 3.5–5.0)
ALK PHOS: 86 U/L (ref 40–150)
ALT: 9 U/L (ref 0–55)
AST: 12 U/L (ref 5–34)
Anion Gap: 11 mEq/L (ref 3–11)
BUN: 10.9 mg/dL (ref 7.0–26.0)
CO2: 26 mEq/L (ref 22–29)
Calcium: 10 mg/dL (ref 8.4–10.4)
Chloride: 101 mEq/L (ref 98–109)
Creatinine: 0.9 mg/dL (ref 0.6–1.1)
EGFR: 63 mL/min/{1.73_m2} — AB (ref >90)
Glucose: 135 mg/dl (ref 70–140)
POTASSIUM: 4 meq/L (ref 3.5–5.1)
Sodium: 139 mEq/L (ref 136–145)
Total Bilirubin: 0.32 mg/dL (ref 0.20–1.20)
Total Protein: 7.2 g/dL (ref 6.4–8.3)

## 2016-07-24 MED ORDER — DEXAMETHASONE SODIUM PHOSPHATE 10 MG/ML IJ SOLN
INTRAMUSCULAR | Status: AC
  Filled 2016-07-24: qty 1

## 2016-07-24 MED ORDER — ONDANSETRON HCL 8 MG PO TABS
ORAL_TABLET | ORAL | Status: AC
  Filled 2016-07-24: qty 1

## 2016-07-24 MED ORDER — DOXORUBICIN HCL LIPOSOMAL CHEMO INJECTION 2 MG/ML
40.0000 mg/m2 | Freq: Once | INTRAVENOUS | Status: AC
  Administered 2016-07-24: 76 mg via INTRAVENOUS
  Filled 2016-07-24: qty 38

## 2016-07-24 MED ORDER — SODIUM CHLORIDE 0.9% FLUSH
10.0000 mL | INTRAVENOUS | Status: DC | PRN
  Administered 2016-07-24: 10 mL
  Filled 2016-07-24: qty 10

## 2016-07-24 MED ORDER — PANTOPRAZOLE SODIUM 40 MG PO TBEC: 40.0000 mg | DELAYED_RELEASE_TABLET | Freq: Every day | ORAL | 1 refills | Status: DC

## 2016-07-24 MED ORDER — LIDOCAINE-PRILOCAINE 2.5-2.5 % EX CREA: TOPICAL_CREAM | CUTANEOUS | 11 refills | Status: AC

## 2016-07-24 MED ORDER — DEXAMETHASONE SODIUM PHOSPHATE 10 MG/ML IJ SOLN
10.0000 mg | Freq: Once | INTRAMUSCULAR | Status: AC
  Administered 2016-07-24: 10 mg via INTRAVENOUS

## 2016-07-24 MED ORDER — SODIUM CHLORIDE 0.9% FLUSH
10.0000 mL | Freq: Once | INTRAVENOUS | Status: DC
  Filled 2016-07-24: qty 10

## 2016-07-24 MED ORDER — ONDANSETRON HCL 8 MG PO TABS
8.0000 mg | ORAL_TABLET | Freq: Once | ORAL | Status: AC
  Administered 2016-07-24: 8 mg via ORAL

## 2016-07-24 MED ORDER — DEXTROSE 5 % IV SOLN
Freq: Once | INTRAVENOUS | Status: AC
  Administered 2016-07-24: 12:00:00 via INTRAVENOUS

## 2016-07-24 MED ORDER — HEPARIN SOD (PORK) LOCK FLUSH 100 UNIT/ML IV SOLN
500.0000 [IU] | Freq: Once | INTRAVENOUS | Status: AC | PRN
  Administered 2016-07-24: 500 [IU]
  Filled 2016-07-24: qty 5

## 2016-07-24 MED FILL — LIDOCAINE-PRILOCAINE CREAM: 2.5-2.5 | 10 days supply | Qty: 30 | Fill #0

## 2016-07-24 MED FILL — PANTOPRAZOLE SOD DR 40 MG T: 40 | 30 days supply | Qty: 30 | Fill #0

## 2016-07-24 NOTE — Progress Notes (Signed)
Pt wanted labs drawn peripherally. Stated "I don't have cream on." Gave pt ice to put on port before treatment.  Roverto Bodmer LPN

## 2016-07-24 NOTE — Telephone Encounter (Signed)
Per 2/19 LOS and staff message I have scheduled appts. Gave patient calendar and notified the scheduler

## 2016-07-24 NOTE — Telephone Encounter (Signed)
Patient given two bottles of contrast and instructions for CT Scan appointments.

## 2016-07-24 NOTE — Patient Instructions (Signed)
[facility] Discharge Instructions for Patients Receiving Chemotherapy  Today you received the following chemotherapy agents Doxil (doxorubicin liposomal)  To help prevent nausea and vomiting after your treatment, we encourage you to take your nausea medication as prescribed   If you develop nausea and vomiting that is not controlled by your nausea medication, call the clinic.   BELOW ARE SYMPTOMS THAT SHOULD BE REPORTED IMMEDIATELY:  *FEVER GREATER THAN 100.5 F  *CHILLS WITH OR WITHOUT FEVER  NAUSEA AND VOMITING THAT IS NOT CONTROLLED WITH YOUR NAUSEA MEDICATION  *UNUSUAL SHORTNESS OF BREATH  *UNUSUAL BRUISING OR BLEEDING  TENDERNESS IN MOUTH AND THROAT WITH OR WITHOUT PRESENCE OF ULCERS  *URINARY PROBLEMS  *BOWEL PROBLEMS  UNUSUAL RASH Items with * indicate a potential emergency and should be followed up as soon as possible.  Feel free to call the clinic you have any questions or concerns. The clinic phone number is [phone].  Please show the [facility] at check-in to the Emergency Department and triage nurse.

## 2016-07-24 NOTE — Progress Notes (Signed)
Nutrition follow-up.  Patient receiving chemotherapy for ovarian cancer. Weight is stable and documented as 160.9 pounds February 19 from 160.8 pounds January 29. Patient reports she has increased oral intake and is consuming a regular diet without difficulty. Reports having a bowel movement on a daily basis. Continues to drink juices. No nutrition concerns at this time.  Nutrition diagnosis:  Food and nutrition related knowledge deficit resolved.  Educated patient to continue strategies for consuming adequate calories and protein to promote weight maintenance. Encouraged her to contact me with questions or concerns.  **Disclaimer: This note was dictated with voice recognition software. Similar sounding words can inadvertently be transcribed and this note may contain transcription errors which may not have been corrected upon publication of note.**

## 2016-07-25 ENCOUNTER — Telehealth: Payer: Self-pay | Admitting: *Deleted

## 2016-07-25 LAB — CA 125: Cancer Antigen (CA) 125: 104.4 U/mL — ABNORMAL HIGH (ref 0.0–38.1)

## 2016-07-25 NOTE — Telephone Encounter (Signed)
Left message with results. To call if has questions

## 2016-07-25 NOTE — Telephone Encounter (Signed)
-----   Message from Heath Lark, MD sent at 07/25/2016 11:17 AM EST ----- Regarding: CA125 Please call daughter with results ----- Message ----- From: Interface, Lab In Three Zero One Sent: 07/24/2016  10:36 AM To: Heath Lark, MD

## 2016-07-27 ENCOUNTER — Encounter: Payer: Self-pay | Admitting: Hematology and Oncology

## 2016-07-27 ENCOUNTER — Telehealth: Payer: Self-pay | Admitting: *Deleted

## 2016-07-27 DIAGNOSIS — D473 Essential (hemorrhagic) thrombocythemia: Secondary | ICD-10-CM | POA: Insufficient documentation

## 2016-07-27 DIAGNOSIS — D75839 Thrombocytosis, unspecified: Secondary | ICD-10-CM | POA: Insufficient documentation

## 2016-07-27 NOTE — Telephone Encounter (Signed)
Spoke with vonte [phone] she will set up prior auth for ECHO

## 2016-07-27 NOTE — Assessment & Plan Note (Signed)
This is likely reactive in nature due to presence of peritoneal drain.  Observe only.

## 2016-07-27 NOTE — Telephone Encounter (Signed)
-----   Message from Heath Lark, MD sent at 07/27/2016  6:11 AM EST ----- Regarding: echo not done I ordered ECHO Not schedule/done yet

## 2016-07-27 NOTE — Assessment & Plan Note (Signed)
Clinically, she has less output from her peritoneal drain.  I suspect she is responding to treatment. As above, I plan to order repeat imaging study for objective assessment of response to treatment

## 2016-07-27 NOTE — Assessment & Plan Note (Signed)
She appears to be tolerating treatment very well. The output from the drain is getting less. Her nutritional status is improving and tumor markers are improving. Overall, she has positive clinical response to treatment. I recommend repeat echocardiogram before proceed with next cycle of chemotherapy to rule out congestive heart failure. I also plan to order repeat imaging study for objective response to treatment. We will get IR to remove the drain in the near future.

## 2016-07-27 NOTE — Progress Notes (Signed)
[facility] FOLLOW-UP progress notes  No care team member to display  CHIEF COMPLAINTS/PURPOSE OF VISIT:  Recurrent ovarian cancer  HISTORY OF PRESENTING ILLNESS:  Audrey Garza 76 y.o. female was transferred to my care after her prior physician has left.  The patient is accompanied by her daughter, Audrey Garza.  The patient herself does not speak [place].  Interpretation is through her daughter I reviewed the patient's records extensive and collaborated the history with the patient. Summary of her history is as follows:   Ovarian carcinoma ([place])   04/19/2015 Initial Diagnosis    In late 03-2015 she in [place] and was found to have large pelvic mass and perihepatic ascites. CA 125 then was 359, with CEA of 3, CA 19-9 5.8 and alpha fetoprotein 2.3. She had exploratory laparotomy 04-19-15, not done by gyn oncologist, with bilateral ovaried an tumors, 10 cm with capsule ruptured on right , smaller on left, peritoneal carcinomatosis.       05/20/2015 Miscellaneous    She had some bowel obstruction post operatively, with NG used . Pathology reportedcystic and solid left ovarian mass, 13 cm right ovarian mass, moderately differentiated tumor "tumor de celulas de la granulosa". 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 CT AP 05-2015 had ascites, carcinomatosis and multiple peritoneal implants. CA 125 on 05-24-15 baseline for chemo was 141. She received 3 cycles of taxol + carboplatin from 05-26-15 thru 07-07-15 (not clear if avastin given those cycles), after which carboplatin was not available in [place]. She received taxol 175 mg/m2, CDDP and avastin for 3 additional cycles thru 10-07-15. Second look laparotomy was discussed, patient declined, and instead had 4 cycles avastin + taxol from 12-01-15 thru 02-02-16 (apparentlyno CDDP after 10-07-15 and no carboplatin after 07-07-15). CT CAP 02-24-16 reportedly had no  evidence of malignancy, with some bullous emphysema, tortuous sigmoid colon with diverticulosis. . Recommendation was for maintenance avastin x 1 year, however patient then came to [place] to be with family.       05/20/2016 Imaging    Extensive peritoneal disease with omental thickening/ 18 and large volume ascites. 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 Trace bilateral pleural effusions. Moderate hiatal hernia. Bilateral inguinal hernias containing fat. Left colonic diverticulosis. Aortoiliac atherosclerosis.      05/20/2016 - 06/13/2016 Hospital Admission    76 year old female with a history of metastatic ovarian cancer diagnosed in January 2017 (s/p primary debulking surgery followed by 8 adjuvant cycles of carboplatin and paclitaxel and 4 additional cycles of carboplatin, paclitaxel, avastin), last chemo Sept 2017, presented with progressive abdominal bloating and pain for 3 weeks duration. She was feeling well until the first week of December, 2017 when she was sick with a virus from which she never recovered. She states that she began feeling distended in the abdomen and uncomfortable and was taken to the ED on 05/20/16. CT chest/abd/pelvis9-21-17 reportedly with no evidence of malignancy, with some bullous emphysema, tortuous sigmoid colon with diverticulosis. Recommendation was for maintenance avastin x 1 year, however patient then came to [facility] in 02-2016. She has not established care with any physician since coming to [place]. Unfortunately, after paracentesis she began having increasing abdominal pain. Abdominal x-ray revealed distended small bowel loops concerning for small bowel obstruction. Gen. surgery was consulted. NG was inserted to suction. Because of prolonged Hospitalization she was placed on TPN/TNA. She underwent Chemotheray with Doxil on 12/26. Repeat U/S 12/31 showed interval progression of ascites and  She is s/p repeat Paracentesis 06-06-16 which removed 4  liters. She was subsequently discharged      05/21/2016 Tumor Marker    Patient's tumor was tested for the following markers: CA125 Results of the tumor marker test revealed 249.8      05/22/2016 Procedure    Successful ultrasound-guided paracentesis yielding 2.9 liters of peritoneal fluid.       05/22/2016 Pathology Results    PERITONEAL/ASCITIC FLUID, 1 OF 1 COLLECTED ON 05/22/16: ADENOCARCINOMA. ADDENDUM: Immunohistochemistry is performed on the cell block and the tumor is strongly positive with cytokeratin 7 and cytokeratin 8/18 and shows patchy positivity with estrogen receptor, cytokeratin 5/6, WT-1 and MOC-31 with focal weak positivity with PAX-8. The tumor is negative with thyroglobulin, thyroid transcription factor-1, Napsin-A, progesterone receptor, GATA 3, gross cystic disease fluid protein, cytokeratin 20, CDX-2 and Calretinin. 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       05/24/2016 Procedure    Ultrasound and fluoroscopically guided right internal jugular single lumen power port catheter insertion. Tip in the SVC/RA junction. Catheter ready for use      05/24/2016 Imaging    LV EF: 65% -   70%      05/28/2016 Imaging    Small volume of abdominal and pelvic ascites is identified and inadequate for paracentesis.      05/30/2016 -  Chemotherapy    She received doxorubicin      06/04/2016 Imaging    Increased amount of ascites, freely distributed in all 4 quadrants. No other acute finding by ultrasound.      06/06/2016 Imaging    Small left greater than right pleural effusions, increased compared to prior CT. Interim development of partial consolidations within the bilateral lower lobes which may reflect atelectasis or pneumonia. Few scattered foci of ground-glass density within the right upper and left upper lobes, suspect  small foci of inflammation or infection. 2. Moderate to large volume of ascites in the upper abdomen with extensive mesenteric and omental metastatic disease partially visualized. 3. Dilated gallbladder.      06/08/2016 Procedure    Successful ultrasound and fluoroscopic guided placement of a tunneled peritoneal drainage catheter. 2. Successful aspiration of 900 cc of serous ascites following tunneled peritoneal drainage catheter placement.      06/19/2016 Tumor Marker    Patient's tumor was tested for the following markers: CA125 Results of the tumor marker test revealed 166.6      07/24/2016 Tumor Marker    Patient's tumor was tested for the following markers: CA125 Results of the tumor marker test revealed 104.4      She returns today for further chemotherapy. She tolerated treatment well.  Denies mucositis, nausea or vomiting. Denies abdominal bloating.  She has less frequent drainage from the peritoneal catheter, only 300 cc once a week. She has minimum pain.  MEDICAL HISTORY:  Past Medical History:  Diagnosis Date  . Cancer [facility])     SURGICAL HISTORY: Past Surgical History:  Procedure Laterality Date  . ABDOMINAL HYSTERECTOMY    . IR GENERIC HISTORICAL  05/24/2016   IR FLUORO GUIDE PORT INSERTION RIGHT 05/24/2016 Greggory Keen, MD [facility]  . IR GENERIC HISTORICAL  05/24/2016   IR US GUIDE VASC ACCESS RIGHT 05/24/2016 Greggory Keen, MD [facility]  . IR GENERIC HISTORICAL  06/08/2016   IR PERC TUN PERIT CATH WO PORT S&I Dartha Lodge 06/08/2016 Sandi Mariscal,  MD [facility]    SOCIAL HISTORY: Social History   Social History  . Marital status: Unknown    Spouse name: N/A  . Number of children: N/A  . Years of education: N/A   Occupational History  . Not on file.   Social History Main Topics  . Smoking status: Never Smoker  . Smokeless tobacco: Never Used  . Alcohol use No  . Drug use: Unknown  . Sexual activity: Not on file   Other Topics Concern  . Not on file    Social History Narrative  . No narrative on file    FAMILY HISTORY: History reviewed. No pertinent family history.  ALLERGIES:  has No Known Allergies.  MEDICATIONS:  Current Outpatient Prescriptions  Medication Sig Dispense Refill  . acetaminophen (TYLENOL) 325 MG tablet Take 650 mg by mouth every 5 (five) hours as needed for moderate pain.    Marland Kitchen alum & mag hydroxide-simeth (MAALOX/MYLANTA) 200-200-20 MG/5ML suspension Take 60 mLs by mouth every 4 (four) hours as needed for indigestion. (Patient not taking: Reported on 06/19/2016) 355 mL 0  . Glycerin, Adult, 2.1 g SUPP Place 1 suppository rectally as needed for moderate constipation. (Patient not taking: Reported on 06/19/2016) 10 suppository 0  . lidocaine-prilocaine (EMLA) cream Apply to port 1 hour before access with needle. Cover with plastic wrap. 30 g 11  . magnesium 30 MG tablet Take 30 mg by mouth daily.     . ondansetron (ZOFRAN ODT) 8 MG disintegrating tablet Take 1 tablet (8 mg total) by mouth every 8 (eight) hours as needed for nausea or vomiting. (Patient not taking: Reported on 06/19/2016) 30 tablet 0  . oxyCODONE-acetaminophen (PERCOCET/ROXICET) 5-325 MG tablet Take 1 tablet by mouth every 4 (four) hours as needed for moderate pain or severe pain. (Patient not taking: Reported on 06/26/2016) 10 tablet 0  . pantoprazole (PROTONIX) 40 MG tablet Take 1 tablet (40 mg total) by mouth daily. 30 tablet 1  . Potassium 75 MG TABS Take 75 mg by mouth daily.    . promethazine (PHENERGAN) 12.5 MG tablet Take 1 tablet (12.5 mg total) by mouth every 6 (six) hours as needed for nausea or vomiting. (Patient not taking: Reported on 06/19/2016) 15 tablet 0  . protein supplement (UNJURY CHICKEN SOUP) POWD Take 27 g (8 oz total) by mouth 2 (two) times daily with a meal. (Patient not taking: Reported on 07/24/2016) 27 g 0  . simethicone (GAS-X) 80 MG chewable tablet Chew 160 mg by mouth every 6 (six) hours as needed for flatulence.     No current  facility-administered medications for this visit.     REVIEW OF SYSTEMS:   Constitutional: Denies fevers, chills or abnormal night sweats Eyes: Denies blurriness of vision, double vision or watery eyes Ears, nose, mouth, throat, and face: Denies mucositis or sore throat Respiratory: Denies cough, dyspnea or wheezes Cardiovascular: Denies palpitation, chest discomfort or lower extremity swelling Gastrointestinal:  Denies nausea, heartburn or change in bowel habits Skin: Denies abnormal skin rashes Lymphatics: Denies new lymphadenopathy or easy bruising Neurological:Denies numbness, tingling or new weaknesses Behavioral/Psych: Mood is stable, no new changes  All other systems were reviewed with the patient and are negative.  PHYSICAL EXAMINATION: ECOG PERFORMANCE STATUS: 1 - Symptomatic but completely ambulatory  Vitals:   07/24/16 1045  BP: 110/64  Pulse: 90  Resp: 18  Temp: 98.2 F (36.8 C)   Filed Weights   07/24/16 1045  Weight: 160 lb 14.4 oz (73 kg)  GENERAL:alert, no distress and comfortable SKIN: skin color, texture, turgor are normal, no rashes or significant lesions EYES: normal, conjunctiva are pink and non-injected, sclera clear OROPHARYNX:no exudate, normal lips, buccal mucosa, and tongue  NECK: supple, thyroid normal size, non-tender, without nodularity LYMPH:  no palpable lymphadenopathy in the cervical, axillary or inguinal LUNGS: clear to auscultation and percussion with normal breathing effort HEART: regular rate & rhythm and no murmurs without lower extremity edema ABDOMEN:abdomen soft, non-tender and normal bowel sounds.  Peritoneal catheter in situ Musculoskeletal:no cyanosis of digits and no clubbing  PSYCH: alert & oriented x 3 with fluent speech NEURO: no focal motor/sensory deficits  LABORATORY DATA:  I have reviewed the data as listed Lab Results  Component Value Date   WBC 4.4 07/24/2016   HGB 11.8 07/24/2016   HCT 35.5 07/24/2016   MCV  84.3 07/24/2016   PLT 408 (H) 07/24/2016    Recent Labs  05/31/16 1741  06/09/16 1951 06/10/16 1030 06/11/16 0423 06/13/16 0449  06/26/16 1121 07/03/16 1418 07/24/16 1022  NA 139  < > 131*  --  131* 134*  < > 137 135* 139  K 3.5  < > 4.2  --  4.4 4.3  < > 4.3 4.9 4.0  CL 108  < > 97*  --  98* 100*  --   --   --   --   CO2 24  < > 28  --  25 26  < > 26 26 26   GLUCOSE 147*  < > 159*  --  148* 114*  < > 118 115 135  BUN 6  < > 14  --  14 13  < > 19.9 15.8 10.9  CREATININE 0.48  < > 0.50 0.52 0.60 0.62  < > 0.8 0.8 0.9  CALCIUM 8.1*  < > 8.2*  --  8.0* 8.2*  < > 9.8 10.1 10.0  GFRNONAA >60  < > >60 >60 >60 >60  --   --   --   --   GFRAA >60  < > >60 >60 >60 >60  --   --   --   --   PROT 5.5*  < > 6.1*  --  5.3* 5.4*  < > 7.1 7.5 7.2  ALBUMIN 2.4*  < > 2.2*  --  2.2* 2.2*  < > 3.0* 3.2* 3.4*  AST 11*  < > 22  --  19 19  < > 11 12 12   ALT 7*  < > 20  --  20 20  < > 9 9 9   ALKPHOS 62  < > 131*  --  123 119  < > 83 86 86  BILITOT 0.6  < > 0.3  --  0.3 0.4  < > 0.28 0.31 0.32  BILIDIR <0.1*  --   --   --   --   --   --   --   --   --   IBILI NOT CALCULATED  --   --   --   --   --   --   --   --   --   < > = values in this interval not displayed.  ASSESSMENT & PLAN:  Ovarian carcinoma ([place]) She appears to be tolerating treatment very well. The output from the drain is getting less. Her nutritional status is improving and tumor markers are improving. Overall, she has positive clinical response to treatment. I recommend repeat echocardiogram before proceed with next cycle  of chemotherapy to rule out congestive heart failure. I also plan to order repeat imaging study for objective response to treatment. We will get IR to remove the drain in the near future.  Moderate protein-calorie malnutrition ([facility]) Overall, her nutritional status is improving. She will continue close follow-up with dietitian  Malignant ascites Clinically, she has less output from her peritoneal drain.  I  suspect she is responding to treatment. As above, I plan to order repeat imaging study for objective assessment of response to treatment  Thrombocytosis ([place]) This is likely reactive in nature due to presence of peritoneal drain.  Observe only.   Orders Placed This Encounter  Procedures  . CT ABDOMEN PELVIS W CONTRAST    Standing Status:   Future    Standing Expiration Date:   08/28/2017    Order Specific Question:   Reason for exam:    Answer:   staging ovarian cancer, assess response to Rx    Order Specific Question:   Preferred imaging location?    Answer:   [place] CONTRAST    Standing Status:   Future    Standing Expiration Date:   08/28/2017    Order Specific Question:   Reason for exam:    Answer:   Ovarian cancer with pleural effusion, assess response to Rx    Order Specific Question:   Preferred imaging location?    Answer:   [facility]  . ECHOCARDIOGRAM LIMITED    Standing Status:   Future    Standing Expiration Date:   10/21/2017    Order Specific Question:   Where should this test be performed    Answer:   [facility]    Order Specific Question:   Complete or Limited study?    Answer:   Limited    Order Specific Question:   Does the patient have a known history of hypersensitivity to Perflutren (Chief Technology Officer for echocardiograms - CHECK ALLERGIES)    Answer:   No    Order Specific Question:   ADMINISTER PERFLUTERN    Answer:   ADMINISTER PERFLUTREN    Order Specific Question:   Expected Date:    Answer:   1 week    Order Specific Question:   Reason for exam-Echo    Answer:   Chemo  V67.2 / Z09    All questions were answered. The patient knows to call the clinic with any problems, questions or concerns. I spent 30 minutes counseling the patient face to face. The total time spent in the appointment was 60 minutes and more than 50% was on counseling.     Heath Lark, MD 07/27/2016 6:14 AM

## 2016-07-27 NOTE — Assessment & Plan Note (Signed)
Overall, her nutritional status is improving. She will continue close follow-up with dietitian

## 2016-07-28 ENCOUNTER — Telehealth: Payer: Self-pay | Admitting: *Deleted

## 2016-07-28 NOTE — Telephone Encounter (Signed)
LM for daughter that ECHO is scheduled for 3/16 @ 1000 after CT scan.  To call if has questions

## 2016-07-28 NOTE — Telephone Encounter (Signed)
-----   Message from Loletta Parish sent at 07/28/2016  9:23 AM EST ----- Regarding: RE: echo not done Patient is a self-pay patient.  No authorization for me to obtain.  Thanks  ----- Message ----- From: Patton Salles, RN Sent: 07/28/2016   8:59 AM To: Loletta Parish Subject: FW: echo not done                               Hi Elmyra Ricks, Can you check on this one?  Zalan Shidler ----- Message ----- From: Heath Lark, MD Sent: 07/27/2016   6:11 AM To: Randolm Idol, RN, Patton Salles, RN Subject: echo not done                                  I ordered ECHO Not schedule/done yet

## 2016-08-11 ENCOUNTER — Telehealth: Payer: Self-pay

## 2016-08-11 ENCOUNTER — Telehealth: Payer: Self-pay | Admitting: *Deleted

## 2016-08-11 NOTE — Telephone Encounter (Signed)
Called and left patient message to call me back regarding pleurx bottle.

## 2016-08-11 NOTE — Telephone Encounter (Signed)
Daughter called back regarding pleurx bottle for her mothers peritoneal drain. They will pick up 1 case (10 bottles) on the 08-18-16 in IR from [facility] when her mother goes for CT of chest. Declined home health referral at this time.  Talked with Tye Maryland in IR she will give them 10 bottle on the 08-18-16.

## 2016-08-11 NOTE — Telephone Encounter (Signed)
Can you call IR to see if they can get her supplies? If IR cannot give her supplies then consult home health

## 2016-08-11 NOTE — Telephone Encounter (Signed)
Patient family member called stating that they only have 1 pleurx bottle left from her peritoneal drain and would like to see how they can get another supply. No visit from home health are ordered (daughter was taught how to take care of the drainage initially). Family member states that when he called "[facility]" the cost for a supply of pleurx bottles would be close to 600 dollars.

## 2016-08-18 ENCOUNTER — Ambulatory Visit (HOSPITAL_BASED_OUTPATIENT_CLINIC_OR_DEPARTMENT_OTHER)
Admission: RE | Admit: 2016-08-18 | Discharge: 2016-08-18 | Disposition: A | Discharge status: Home / Self Care | Payer: Self-pay | Source: Ambulatory Visit | Attending: Hematology and Oncology | Admitting: Hematology and Oncology

## 2016-08-18 ENCOUNTER — Encounter (HOSPITAL_COMMUNITY): Payer: Self-pay

## 2016-08-18 ENCOUNTER — Ambulatory Visit (HOSPITAL_COMMUNITY)
Admission: RE | Admit: 2016-08-18 | Discharge: 2016-08-18 | Disposition: A | Discharge status: Home / Self Care | Payer: Self-pay | Source: Ambulatory Visit | Attending: Hematology and Oncology | Admitting: Hematology and Oncology

## 2016-08-18 DIAGNOSIS — C569 Malignant neoplasm of unspecified ovary: Secondary | ICD-10-CM | POA: Insufficient documentation

## 2016-08-18 DIAGNOSIS — I700 Atherosclerosis of aorta: Secondary | ICD-10-CM | POA: Insufficient documentation

## 2016-08-18 DIAGNOSIS — I272 Pulmonary hypertension, unspecified: Secondary | ICD-10-CM | POA: Insufficient documentation

## 2016-08-18 DIAGNOSIS — I351 Nonrheumatic aortic (valve) insufficiency: Secondary | ICD-10-CM | POA: Insufficient documentation

## 2016-08-18 DIAGNOSIS — C786 Secondary malignant neoplasm of retroperitoneum and peritoneum: Secondary | ICD-10-CM | POA: Insufficient documentation

## 2016-08-18 DIAGNOSIS — R188 Other ascites: Secondary | ICD-10-CM | POA: Insufficient documentation

## 2016-08-18 DIAGNOSIS — K5790 Diverticulosis of intestine, part unspecified, without perforation or abscess without bleeding: Secondary | ICD-10-CM | POA: Insufficient documentation

## 2016-08-18 DIAGNOSIS — J90 Pleural effusion, not elsewhere classified: Secondary | ICD-10-CM | POA: Insufficient documentation

## 2016-08-18 MED ORDER — IOPAMIDOL (ISOVUE-300) INJECTION 61%
INTRAVENOUS | Status: AC
  Filled 2016-08-18: qty 100

## 2016-08-18 MED ORDER — IOPAMIDOL (ISOVUE-300) INJECTION 61%
100.0000 mL | Freq: Once | INTRAVENOUS | Status: AC | PRN
  Administered 2016-08-18: 100 mL via INTRAVENOUS

## 2016-08-18 NOTE — Progress Notes (Signed)
  Echocardiogram 2D Echocardiogram limited has been performed.  Tresa Res 08/18/2016, 10:10 AM

## 2016-08-21 ENCOUNTER — Telehealth: Payer: Self-pay

## 2016-08-21 ENCOUNTER — Ambulatory Visit (HOSPITAL_BASED_OUTPATIENT_CLINIC_OR_DEPARTMENT_OTHER): Payer: Self-pay | Admitting: Hematology and Oncology

## 2016-08-21 ENCOUNTER — Ambulatory Visit: Payer: Self-pay

## 2016-08-21 ENCOUNTER — Other Ambulatory Visit (HOSPITAL_BASED_OUTPATIENT_CLINIC_OR_DEPARTMENT_OTHER): Payer: Self-pay

## 2016-08-21 VITALS — BP 133/74 | HR 94 | Temp 98.3°F | Resp 17 | Ht 63.0 in | Wt 162.4 lb

## 2016-08-21 DIAGNOSIS — C562 Malignant neoplasm of left ovary: Secondary | ICD-10-CM

## 2016-08-21 DIAGNOSIS — C786 Secondary malignant neoplasm of retroperitoneum and peritoneum: Secondary | ICD-10-CM

## 2016-08-21 DIAGNOSIS — R140 Abdominal distension (gaseous): Secondary | ICD-10-CM

## 2016-08-21 DIAGNOSIS — C561 Malignant neoplasm of right ovary: Secondary | ICD-10-CM

## 2016-08-21 DIAGNOSIS — Z95828 Presence of other vascular implants and grafts: Secondary | ICD-10-CM

## 2016-08-21 DIAGNOSIS — C569 Malignant neoplasm of unspecified ovary: Secondary | ICD-10-CM

## 2016-08-21 DIAGNOSIS — R180 Malignant ascites: Secondary | ICD-10-CM

## 2016-08-21 DIAGNOSIS — G893 Neoplasm related pain (acute) (chronic): Secondary | ICD-10-CM

## 2016-08-21 DIAGNOSIS — Z7189 Other specified counseling: Secondary | ICD-10-CM

## 2016-08-21 LAB — COMPREHENSIVE METABOLIC PANEL
ALBUMIN: 3.2 g/dL — AB (ref 3.5–5.0)
ALK PHOS: 85 U/L (ref 40–150)
ALT: 11 U/L (ref 0–55)
AST: 10 U/L (ref 5–34)
Anion Gap: 11 mEq/L (ref 3–11)
BUN: 13.6 mg/dL (ref 7.0–26.0)
CALCIUM: 9.9 mg/dL (ref 8.4–10.4)
CO2: 28 mEq/L (ref 22–29)
CREATININE: 0.8 mg/dL (ref 0.6–1.1)
Chloride: 96 mEq/L — ABNORMAL LOW (ref 98–109)
EGFR: 73 mL/min/{1.73_m2} — ABNORMAL LOW (ref >90)
GLUCOSE: 129 mg/dL (ref 70–140)
POTASSIUM: 3.9 meq/L (ref 3.5–5.1)
SODIUM: 135 meq/L — AB (ref 136–145)
TOTAL PROTEIN: 7.3 g/dL (ref 6.4–8.3)
Total Bilirubin: 0.45 mg/dL (ref 0.20–1.20)

## 2016-08-21 LAB — CBC WITH DIFFERENTIAL/PLATELET
BASO%: 0.8 % (ref 0.0–2.0)
Basophils Absolute: 0.1 10*3/uL (ref 0.0–0.1)
EOS%: 0.4 % (ref 0.0–7.0)
Eosinophils Absolute: 0 10*3/uL (ref 0.0–0.5)
HEMATOCRIT: 34.2 % — AB (ref 34.8–46.6)
HEMOGLOBIN: 11.4 g/dL — AB (ref 11.6–15.9)
LYMPH#: 1.7 10*3/uL (ref 0.9–3.3)
LYMPH%: 25.2 % (ref 14.0–49.7)
MCH: 27.8 pg (ref 25.1–34.0)
MCHC: 33.2 g/dL (ref 31.5–36.0)
MCV: 83.8 fL (ref 79.5–101.0)
MONO#: 1.2 10*3/uL — ABNORMAL HIGH (ref 0.1–0.9)
MONO%: 17.6 % — AB (ref 0.0–14.0)
NEUT#: 3.8 10*3/uL (ref 1.5–6.5)
NEUT%: 56 % (ref 38.4–76.8)
Platelets: 505 10*3/uL — ABNORMAL HIGH (ref 145–400)
RBC: 4.08 10*6/uL (ref 3.70–5.45)
RDW: 17.7 % — AB (ref 11.2–14.5)
WBC: 6.8 10*3/uL (ref 3.9–10.3)

## 2016-08-21 MED ORDER — POLYETHYLENE GLYCOL 3350 17 G PO PACK: 17.0000 g | PACK | Freq: Every day | ORAL | 9 refills | Status: DC

## 2016-08-21 MED ORDER — SODIUM CHLORIDE 0.9% FLUSH
10.0000 mL | Freq: Once | INTRAVENOUS | Status: DC
  Filled 2016-08-21: qty 10

## 2016-08-21 MED ORDER — SENNOSIDES-DOCUSATE SODIUM 8.6-50 MG PO TABS: 2.0000 | ORAL_TABLET | Freq: Two times a day (BID) | ORAL | 9 refills | Status: DC

## 2016-08-21 MED ORDER — OXYCODONE HCL 5 MG PO TABS: 5.0000 mg | ORAL_TABLET | ORAL | 0 refills | Status: DC | PRN

## 2016-08-21 MED FILL — oxyCODONE HCL 5 MG TABS: 5 | 10 days supply | Qty: 60 | Fill #0

## 2016-08-21 MED FILL — POLYETHYLENE GLYCOL 3350: 14 days supply | Qty: 255 | Fill #0

## 2016-08-21 NOTE — Progress Notes (Signed)
Blood drawn peripherally. Daughter stated, "port does not work." Port was not accessed. Tiernan Millikin LPN

## 2016-08-21 NOTE — Telephone Encounter (Signed)
Cathy in IR called to let us know a family member was there today to pick up a case of pleurex bottles. The pt was supposed to have been there on the 16th. Tye Maryland does not have an IR exam to charge against today. She said pt has appt with Dr Alvy Bimler and infusion today. Can Dr Alvy Bimler get the bottles for the pt b/c she has an open appt today that can be charged against. [facility] [phone] or [phone]. Talked with Tammi RN for Dr Alvy Bimler.

## 2016-08-21 NOTE — Progress Notes (Signed)
DISCONTINUE OFF PATHWAY REGIMEN - Ovarian     A cycle is every 28 days:     Liposomal Doxorubicin        Dose Mod: None  **Always confirm dose/schedule in your pharmacy ordering system**    REASON: Disease Progression PRIOR TREATMENT: [id number]: Liposomal Doxorubicin (Doxil) 40 mg/m2 q28 Days; Stop Treatment After 6 Cycles If Complete Response, Otherwise Continue Treatment Until Progression or Unacceptable Toxicity TREATMENT RESPONSE: Progressive Disease (PD)  START OFF PATHWAY REGIMEN - Ovarian   [id number]:Carboplatin AUC=4 D1 + Gemcitabine 1,000 mg/m2 D1, 8 q21 Days:   A cycle is every 21 days:     Carboplatin      Gemcitabine   **Always confirm dose/schedule in your pharmacy ordering system**    Patient Characteristics: Second Line Therapy, < 6 Months AJCC T Category: T3c AJCC N Category: N1 AJCC M Category: M1 AJCC 8 Stage Grouping: IV Line of Therapy: Second Line Therapy BRCA Mutation Status: Awaiting Test Results Would you be surprised if this patient died  in the next year? I would NOT be surprised if this patient died in the next year Time since last treatment: < 6 Months  Intent of Therapy: Non-Curative / Palliative Intent, Discussed with Patient

## 2016-08-21 NOTE — Telephone Encounter (Signed)
Bottles obtained from central supply. Called daughter to let her know we have the bottles at [facility]. They went home from [place], did not understand they were to come back to [facility] to get bottles. Daughter is legally blind and will need to get ride back to [facility] to pick up bottles

## 2016-08-22 ENCOUNTER — Encounter: Payer: Self-pay | Admitting: Hematology and Oncology

## 2016-08-22 DIAGNOSIS — G893 Neoplasm related pain (acute) (chronic): Secondary | ICD-10-CM | POA: Insufficient documentation

## 2016-08-22 DIAGNOSIS — Z7189 Other specified counseling: Secondary | ICD-10-CM | POA: Insufficient documentation

## 2016-08-22 DIAGNOSIS — R140 Abdominal distension (gaseous): Secondary | ICD-10-CM | POA: Insufficient documentation

## 2016-08-22 LAB — CA 125: Cancer Antigen (CA) 125: 96.1 U/mL — ABNORMAL HIGH (ref 0.0–38.1)

## 2016-08-22 NOTE — Progress Notes (Signed)
[place] OFFICE PROGRESS NOTE  Patient Care Team: No Pcp Per Patient as PCP - General (General Practice)  SUMMARY OF ONCOLOGIC HISTORY:   Ovarian carcinoma ([place])   04/19/2015 Initial Diagnosis    In late 03-2015 she in [place] and was found to have large pelvic mass and perihepatic ascites. CA 125 then was 359, with CEA of 3, CA 19-9 5.8 and alpha fetoprotein 2.3. She had exploratory laparotomy 04-19-15, not done by gyn oncologist, with bilateral ovaried an tumors, 10 cm with capsule ruptured on right , smaller on left, peritoneal carcinomatosis.       05/20/2015 Miscellaneous    She had some bowel obstruction post operatively, with NG used . Pathology reportedcystic and solid left ovarian mass, 13 cm right ovarian mass, moderately differentiated tumor "tumor de celulas de la granulosa". 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 CT AP 05-2015 had ascites, carcinomatosis and multiple peritoneal implants. CA 125 on 05-24-15 baseline for chemo was 141. She received 3 cycles of taxol + carboplatin from 05-26-15 thru 07-07-15 (not clear if avastin given those cycles), after which carboplatin was not available in [place]. She received taxol 175 mg/m2, CDDP and avastin for 3 additional cycles thru 10-07-15. Second look laparotomy was discussed, patient declined, and instead had 4 cycles avastin + taxol from 12-01-15 thru 02-02-16 (apparentlyno CDDP after 10-07-15 and no carboplatin after 07-07-15). CT CAP 02-24-16 reportedly had no evidence of malignancy, with some bullous emphysema, tortuous sigmoid colon with diverticulosis. . Recommendation was for maintenance avastin x 1 year, however patient then came to [place] to be with family.       05/20/2016 Imaging    Extensive peritoneal disease with omental thickening/ 18 and large volume ascites. 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  Trace bilateral pleural effusions. Moderate hiatal hernia. Bilateral inguinal hernias containing fat. Left colonic diverticulosis. Aortoiliac atherosclerosis.      05/20/2016 - 06/13/2016 Hospital Admission    76 year old female with a history of metastatic ovarian cancer diagnosed in January 2017 (s/p primary debulking surgery followed by 8 adjuvant cycles of carboplatin and paclitaxel and 4 additional cycles of carboplatin, paclitaxel, avastin), last chemo Sept 2017, presented with progressive abdominal bloating and pain for 3 weeks duration. She was feeling well until the first week of December, 2017 when she was sick with a virus from which she never recovered. She states that she began feeling distended in the abdomen and uncomfortable and was taken to the ED on 05/20/16. CT chest/abd/pelvis9-21-17 reportedly with no evidence of malignancy, with some bullous emphysema, tortuous sigmoid colon with diverticulosis. Recommendation was for maintenance avastin x 1 year, however patient then came to [facility] in 02-2016. She has not established care with any physician since coming to [place]. Unfortunately, after paracentesis she began having increasing abdominal pain. Abdominal x-ray revealed distended small bowel loops concerning for small bowel obstruction. Gen. surgery was consulted. NG was inserted to suction. Because of prolonged Hospitalization she was placed on TPN/TNA. She underwent Chemotheray with Doxil on 12/26. Repeat U/S 12/31 showed interval progression of ascites and She is s/p repeat Paracentesis 06-06-16 which removed 4 liters. She was subsequently discharged      05/21/2016 Tumor Marker    Patient's tumor was tested for the following markers: CA125 Results of the tumor marker test revealed 249.8      05/22/2016 Procedure    Successful ultrasound-guided paracentesis yielding 2.9 liters of peritoneal fluid.  05/22/2016 Pathology Results    PERITONEAL/ASCITIC FLUID, 1 OF 1 COLLECTED ON  05/22/16: ADENOCARCINOMA. ADDENDUM: Immunohistochemistry is performed on the cell block and the tumor is strongly positive with cytokeratin 7 and cytokeratin 8/18 and shows patchy positivity with estrogen receptor, cytokeratin 5/6, WT-1 and MOC-31 with focal weak positivity with PAX-8. The tumor is negative with thyroglobulin, thyroid transcription factor-1, Napsin-A, progesterone receptor, GATA 3, gross cystic disease fluid protein, cytokeratin 20, CDX-2 and Calretinin. 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       05/24/2016 Procedure    Ultrasound and fluoroscopically guided right internal jugular single lumen power port catheter insertion. Tip in the SVC/RA junction. Catheter ready for use      05/24/2016 Imaging    LV EF: 65% -   70%      05/28/2016 Imaging    Small volume of abdominal and pelvic ascites is identified and inadequate for paracentesis.      05/30/2016 - 08/21/2016 Chemotherapy    She received doxorubicin x 3 cycles, stopped due to disease progression      06/04/2016 Imaging    Increased amount of ascites, freely distributed in all 4 quadrants. No other acute finding by ultrasound.      06/06/2016 Imaging    Small left greater than right pleural effusions, increased compared to prior CT. Interim development of partial consolidations within the bilateral lower lobes which may reflect atelectasis or pneumonia. Few scattered foci of ground-glass density within the right upper and left upper lobes, suspect small foci of inflammation or infection. 2. Moderate to large volume of ascites in the upper abdomen with extensive mesenteric and omental metastatic disease partially visualized. 3. Dilated gallbladder.      06/08/2016 Procedure    Successful ultrasound and fluoroscopic guided placement of a tunneled peritoneal drainage catheter.  2. Successful aspiration of 900 cc of serous ascites following tunneled peritoneal drainage catheter placement.      06/19/2016 Tumor Marker    Patient's tumor was tested for the following markers: CA125 Results of the tumor marker test revealed 166.6      07/24/2016 Tumor Marker    Patient's tumor was tested for the following markers: CA125 Results of the tumor marker test revealed 104.4      08/18/2016 Imaging    Extensive peritoneal metastatic disease within the abdomen. This is somewhat difficult to compare with prior given diffuse nature however there is suggestion of interval progression. Interval resolution of previously described bilateral pleural effusions. Peritoneal drainage catheter is present with tip terminating in the right upper quadrant. Interval decrease in now small volume ascites. Aortic atherosclerosis. Diverticulosis without CT evidence for acute diverticulitis.      08/18/2016 Imaging    LV EF: 60% -  65%       INTERVAL HISTORY: Please see below for problem oriented charting. She returns with her daughter today. Her daughter serves as the interpreter. Her daughter is legally blind. The patient has been having significant bloating, burping and discomfort in her abdomen since the last time she was seen here. She denies chest pain or shortness of breath. No recent infection. No nausea vomiting  REVIEW OF SYSTEMS:   Constitutional: Denies fevers, chills or abnormal weight loss Eyes: Denies blurriness of vision Ears, nose, mouth, throat, and face: Denies mucositis or sore throat Respiratory: Denies cough, dyspnea or wheezes Cardiovascular: Denies palpitation, chest discomfort or lower extremity  swelling Skin: Denies abnormal skin rashes Lymphatics: Denies new lymphadenopathy or easy bruising Neurological:Denies numbness, tingling or new weaknesses Behavioral/Psych: Mood is stable, no new changes  All other systems were reviewed with the patient and are  negative.  I have reviewed the past medical history, past surgical history, social history and family history with the patient and they are unchanged from previous note.  ALLERGIES:  has No Known Allergies.  MEDICATIONS:  Current Outpatient Prescriptions  Medication Sig Dispense Refill  . lidocaine-prilocaine (EMLA) cream Apply to port 1 hour before access with needle. Cover with plastic wrap. 30 g 11  . magnesium 30 MG tablet Take 30 mg by mouth daily.     Marland Kitchen oxyCODONE-acetaminophen (PERCOCET/ROXICET) 5-325 MG tablet Take 1 tablet by mouth every 4 (four) hours as needed for moderate pain or severe pain. 10 tablet 0  . pantoprazole (PROTONIX) 40 MG tablet Take 1 tablet (40 mg total) by mouth daily. 30 tablet 1  . Potassium 75 MG TABS Take 75 mg by mouth daily.    . protein supplement (UNJURY CHICKEN SOUP) POWD Take 27 g (8 oz total) by mouth 2 (two) times daily with a meal. 27 g 0  . simethicone (GAS-X) 80 MG chewable tablet Chew 160 mg by mouth every 6 (six) hours as needed for flatulence.    Marland Kitchen acetaminophen (TYLENOL) 325 MG tablet Take 650 mg by mouth every 5 (five) hours as needed for moderate pain.    Marland Kitchen alum & mag hydroxide-simeth (MAALOX/MYLANTA) 200-200-20 MG/5ML suspension Take 60 mLs by mouth every 4 (four) hours as needed for indigestion. (Patient not taking: Reported on 06/19/2016) 355 mL 0  . Glycerin, Adult, 2.1 g SUPP Place 1 suppository rectally as needed for moderate constipation. (Patient not taking: Reported on 06/19/2016) 10 suppository 0  . ondansetron (ZOFRAN ODT) 8 MG disintegrating tablet Take 1 tablet (8 mg total) by mouth every 8 (eight) hours as needed for nausea or vomiting. (Patient not taking: Reported on 06/19/2016) 30 tablet 0  . oxyCODONE (OXY IR/ROXICODONE) 5 MG immediate release tablet Take 1 tablet (5 mg total) by mouth every 4 (four) hours as needed for severe pain. 60 tablet 0  . polyethylene glycol (MIRALAX) packet Take 17 g by mouth daily. 14 each 9  .  promethazine (PHENERGAN) 12.5 MG tablet Take 1 tablet (12.5 mg total) by mouth every 6 (six) hours as needed for nausea or vomiting. (Patient not taking: Reported on 06/19/2016) 15 tablet 0  . senna-docusate (SENOKOT-S) 8.6-50 MG tablet Take 2 tablets by mouth 2 (two) times daily. 60 tablet 9   No current facility-administered medications for this visit.     PHYSICAL EXAMINATION: ECOG PERFORMANCE STATUS: 1 - Symptomatic but completely ambulatory  Vitals:   08/21/16 1331  BP: 133/74  Pulse: 94  Resp: 17  Temp: 98.3 F (36.8 C)   Filed Weights   08/21/16 1331  Weight: 162 lb 6.4 oz (73.7 kg)    GENERAL:alert, no distress and comfortable SKIN: skin color, texture, turgor are normal, no rashes or significant lesions EYES: normal, Conjunctiva are pink and non-injected, sclera clear ABDOMEN:abdomen appears distended Musculoskeletal:no cyanosis of digits and no clubbing  NEURO: alert & oriented x 3 with fluent speech, no focal motor/sensory deficits  LABORATORY DATA:  I have reviewed the data as listed    Component Value Date/Time   NA 135 (L) 08/21/2016 1207   K 3.9 08/21/2016 1207   CL 100 (L) 06/13/2016 0449   CO2 28 08/21/2016 1207  GLUCOSE 129 08/21/2016 1207   BUN 13.6 08/21/2016 1207   CREATININE 0.8 08/21/2016 1207   CALCIUM 9.9 08/21/2016 1207   PROT 7.3 08/21/2016 1207   ALBUMIN 3.2 (L) 08/21/2016 1207   AST 10 08/21/2016 1207   ALT 11 08/21/2016 1207   ALKPHOS 85 08/21/2016 1207   BILITOT 0.45 08/21/2016 1207   GFRNONAA >60 06/13/2016 0449   GFRAA >60 06/13/2016 0449    No results found for: SPEP, UPEP  Lab Results  Component Value Date   WBC 6.8 08/21/2016   NEUTROABS 3.8 08/21/2016   HGB 11.4 (L) 08/21/2016   HCT 34.2 (L) 08/21/2016   MCV 83.8 08/21/2016   PLT 505 (H) 08/21/2016      Chemistry      Component Value Date/Time   NA 135 (L) 08/21/2016 1207   K 3.9 08/21/2016 1207   CL 100 (L) 06/13/2016 0449   CO2 28 08/21/2016 1207   BUN 13.6  08/21/2016 1207   CREATININE 0.8 08/21/2016 1207      Component Value Date/Time   CALCIUM 9.9 08/21/2016 1207   ALKPHOS 85 08/21/2016 1207   AST 10 08/21/2016 1207   ALT 11 08/21/2016 1207   BILITOT 0.45 08/21/2016 1207       RADIOGRAPHIC STUDIES: I have personally reviewed the radiological images as listed and agreed with the findings in the report. Ct Chest W Contrast  Result Date: 08/18/2016 CLINICAL DATA:  Patient with history of ovarian cancer. Pleural effusion. EXAM: CT CHEST, ABDOMEN, AND PELVIS WITH CONTRAST TECHNIQUE: Multidetector CT imaging of the chest, abdomen and pelvis was performed following the standard protocol during bolus administration of intravenous contrast. CONTRAST:  144mL ISOVUE-300 IOPAMIDOL (ISOVUE-300) INJECTION 61% COMPARISON:  Chest CT 06/06/2016; abdomen pelvis CT 05/20/2016. FINDINGS: CT CHEST FINDINGS Cardiovascular: Right anterior chest wall Port-A-Cath is present with tip terminating in the superior vena cava. Normal heart size. No pericardial effusion. Aortic atherosclerosis. Mediastinum/Nodes: No enlarged axillary, mediastinal or hilar lymphadenopathy. Esophagus is normal in appearance. Lungs/Pleura: Motion artifact limits evaluation of the pulmonary parenchyma. Central airways are patent. Scattered bilateral ground-glass pulmonary opacities. Interval resolution of previously visualized bilateral pleural effusions. No large area of pulmonary consolidation. No discrete pulmonary nodularity identified. Musculoskeletal: Thoracic spine degenerative changes. No aggressive or acute appearing osseous lesions. CT ABDOMEN PELVIS FINDINGS Hepatobiliary: Liver is normal in size and contour. 5 mm subcapsular low-attenuation lesion right hepatic lobe (image 56; series 2), too small to characterize. Pancreas: Unremarkable Spleen: Unremarkable Adrenals/Urinary Tract: The adrenal glands are normal. Kidneys enhance symmetrically with contrast. Stable subcentimeter  low-attenuation lesion interpolar region left kidney. Urinary bladder is decompressed. Stomach/Bowel: Sigmoid colonic diverticulosis. No CT evidence for acute diverticulitis. No evidence for bowel obstruction. No free intraperitoneal air. Normal morphology of the stomach. Vascular/Lymphatic: Normal caliber abdominal aorta. Peripheral calcified atherosclerotic plaque. Reproductive: Status post hysterectomy. Other: Small bilateral fat containing inguinal hernias. Right lower anterior abdominal approach drainage catheter with tip terminating in the right upper quadrant. Interval decrease in now small volume ascites predominately within the upper abdomen about the liver and spleen. There is slight interval increase in soft tissue nodularity throughout the abdomen/peritoneal most compatible with peritoneal metastatic disease. Given the extensive nature of the peritoneal metastatic disease, comparison is somewhat difficult. Within the central abdomen on coronal images this measures up to 3.1 cm in thickness, previously 2.4 cm (image 38; series 5). Reference new 1.5 cm nodule in the pelvis (image 76; series 5). New small nodularity about the hepatic dome (image 62; series  6). Musculoskeletal: Lumbar spine degenerative changes. No aggressive or acute appearing osseous lesions. IMPRESSION: Extensive peritoneal metastatic disease within the abdomen. This is somewhat difficult to compare with prior given diffuse nature however there is suggestion of interval progression. Interval resolution of previously described bilateral pleural effusions. Peritoneal drainage catheter is present with tip terminating in the right upper quadrant. Interval decrease in now small volume ascites. Aortic atherosclerosis. Diverticulosis without CT evidence for acute diverticulitis. Electronically Signed   By: Lovey Newcomer M.D.   On: 08/18/2016 10:59   Ct Abdomen Pelvis W Contrast  Result Date: 08/18/2016 CLINICAL DATA:  Patient with history of  ovarian cancer. Pleural effusion. EXAM: CT CHEST, ABDOMEN, AND PELVIS WITH CONTRAST TECHNIQUE: Multidetector CT imaging of the chest, abdomen and pelvis was performed following the standard protocol during bolus administration of intravenous contrast. CONTRAST:  162mL ISOVUE-300 IOPAMIDOL (ISOVUE-300) INJECTION 61% COMPARISON:  Chest CT 06/06/2016; abdomen pelvis CT 05/20/2016. FINDINGS: CT CHEST FINDINGS Cardiovascular: Right anterior chest wall Port-A-Cath is present with tip terminating in the superior vena cava. Normal heart size. No pericardial effusion. Aortic atherosclerosis. Mediastinum/Nodes: No enlarged axillary, mediastinal or hilar lymphadenopathy. Esophagus is normal in appearance. Lungs/Pleura: Motion artifact limits evaluation of the pulmonary parenchyma. Central airways are patent. Scattered bilateral ground-glass pulmonary opacities. Interval resolution of previously visualized bilateral pleural effusions. No large area of pulmonary consolidation. No discrete pulmonary nodularity identified. Musculoskeletal: Thoracic spine degenerative changes. No aggressive or acute appearing osseous lesions. CT ABDOMEN PELVIS FINDINGS Hepatobiliary: Liver is normal in size and contour. 5 mm subcapsular low-attenuation lesion right hepatic lobe (image 56; series 2), too small to characterize. Pancreas: Unremarkable Spleen: Unremarkable Adrenals/Urinary Tract: The adrenal glands are normal. Kidneys enhance symmetrically with contrast. Stable subcentimeter low-attenuation lesion interpolar region left kidney. Urinary bladder is decompressed. Stomach/Bowel: Sigmoid colonic diverticulosis. No CT evidence for acute diverticulitis. No evidence for bowel obstruction. No free intraperitoneal air. Normal morphology of the stomach. Vascular/Lymphatic: Normal caliber abdominal aorta. Peripheral calcified atherosclerotic plaque. Reproductive: Status post hysterectomy. Other: Small bilateral fat containing inguinal hernias.  Right lower anterior abdominal approach drainage catheter with tip terminating in the right upper quadrant. Interval decrease in now small volume ascites predominately within the upper abdomen about the liver and spleen. There is slight interval increase in soft tissue nodularity throughout the abdomen/peritoneal most compatible with peritoneal metastatic disease. Given the extensive nature of the peritoneal metastatic disease, comparison is somewhat difficult. Within the central abdomen on coronal images this measures up to 3.1 cm in thickness, previously 2.4 cm (image 38; series 5). Reference new 1.5 cm nodule in the pelvis (image 76; series 5). New small nodularity about the hepatic dome (image 62; series 6). Musculoskeletal: Lumbar spine degenerative changes. No aggressive or acute appearing osseous lesions. IMPRESSION: Extensive peritoneal metastatic disease within the abdomen. This is somewhat difficult to compare with prior given diffuse nature however there is suggestion of interval progression. Interval resolution of previously described bilateral pleural effusions. Peritoneal drainage catheter is present with tip terminating in the right upper quadrant. Interval decrease in now small volume ascites. Aortic atherosclerosis. Diverticulosis without CT evidence for acute diverticulitis. Electronically Signed   By: Lovey Newcomer M.D.   On: 08/18/2016 10:59    ASSESSMENT & PLAN:  Ovarian carcinoma ([place]) I spent a lot of time reviewing results of the imaging study with the patient and her daughter. I explained to them that based on clinical findings and results of imaging study, she has not responded to recent chemotherapy. The improvement of  the tumor marker is likely a reflection of regular therapeutic paracentesis. 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 I recommend switching her to another treatment combination. On review of her past history, it appears that  the patient has stopped carboplatin due to inability to obtain that drug in her home country. I do not believe she is platinum resistant  We discussed the role of chemotherapy is of palliative intent, in accordance with NCCN guidelines, based on publication below: Int J Gynecol Cancer. 2005 May-Jun;15 Suppl 1:36-41. 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 Pfisterer J1, [place] I, [place] A, Eisenhauer E; AGO-OVAR,; NCIC CTG; EORTC GCG.  Participants with recurrent platinum-sensitive ovarian cancer were randomly assigned to receive either gemcitabine-carboplatin or carboplatin every 21 days. The primary objective was to compare progression-free survival (PFS) between the groups. From September 1999 to April 2002, 356 patients (178 participants received gemcitabine-carboplatin, 178 received carboplatin only) were randomized to treatment. Patients received six cycles of either gemcitabine-carboplatin or carboplatin. With a median follow-up of 17 months, median PFS was 8.6 months for gemcitabine-carboplatin (95% confidence interval [CI] 7.9-9.7 months) and 5.8 months for carboplatin (95% CI 5.2-7.1 months; hazard ratio [HR] 0.72 [95% CI 0.58-0.90; P = 0.0032]). The response rate for the gemcitabine-carboplatin group was 47.2% (95% CI 39.9-54.5%) and 30.9% for carboplatin group (95% CI 24.1-37.7%; P = 0.0016). The HR for overall survival was 0.96 (95% CI 0.75-1.23; P = 0.7349). Patients treated with gemcitabine-carboplatin reported significantly faster palliation of abdominal symptoms and a significantly improved global quality of life. Gemcitabine-carboplatin treatment significantly improves the PFS of patients with platinum-sensitive recurrent ovarian cancer.  We discussed the role of chemotherapy. The intent is of palliative intent.  We discussed some of the risks, benefits, side-effects of carboplatin & gemcitabine  Some of the short term side-effects included,  though not limited to, including weight loss, life threatening infections, risk of allergic reactions, need for transfusions of blood products, nausea, vomiting, change in bowel habits, loss of hair, admission to hospital for various reasons, and risks of death.   Long term side-effects are also discussed including risks of infertility, permanent damage to nerve function, hearing loss, chronic fatigue, kidney damage with possibility needing hemodialysis, and rare secondary malignancy including bone marrow disorders.  The patient is aware that the response rates discussed earlier is not guaranteed.  After a long discussion, patient made an informed decision to proceed with the prescribed plan of care.   Patient education material was dispensed She will get treatment started next week, to receive treatment on day 1 and day 8, rest day 15 for cycle of 21 days. We will monitor her blood count carefully during treatment.     Abdominal bloating I have reviewed this with the patient and her daughter multiple times during this long discussion today. The patient requested some symptomatic relief with something stronger than Gas-X. I told her that the most likely cause of her symptoms is due to disease progression. Giving her medication to suppress the symptoms might cause risk of bowel obstruction. I recommend we work on giving her regular laxatives to ensure adequate bowel movement and that should also provide symptomatic relief.  Cancer associated pain She has pain in the lower abdomen area coinciding with the areas of her peritoneal catheter I refilled her prescription pain medicine and warned her about risk of constipation  Malignant ascites The amount of ascites is less. She will continue periodic paracentesis for relief  Goals  of care, counseling/discussion The patient is aware she has incurable disease and treatment is strictly palliative. We discussed importance of Advanced Directives  and Living will. The patient is not interested to discuss further about CODE STATUS, etc. I did warn her about potential risk of disease progression if she does not respond to treatment, and she needs to be prepared for end-of-life care    No orders of the defined types were placed in this encounter.  All questions were answered. The patient knows to call the clinic with any problems, questions or concerns. No barriers to learning was detected. I spent 40 minutes counseling the patient face to face. The total time spent in the appointment was 55 minutes and more than 50% was on counseling and review of test results     Heath Lark, MD 08/22/2016 8:19 AM

## 2016-08-22 NOTE — Assessment & Plan Note (Signed)
I spent a lot of time reviewing results of the imaging study with the patient and her daughter. I explained to them that based on clinical findings and results of imaging study, she has not responded to recent chemotherapy. The improvement of the tumor marker is likely a reflection of regular therapeutic paracentesis. 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 I recommend switching her to another treatment combination. On review of her past history, it appears that the patient has stopped carboplatin due to inability to obtain that drug in her home country. I do not believe she is platinum resistant  We discussed the role of chemotherapy is of palliative intent, in accordance with NCCN guidelines, based on publication below: Int J Gynecol Cancer. 2005 May-Jun;15 Suppl 1:36-41. 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 Pfisterer J1, [place] I, [place] A, Eisenhauer E; AGO-OVAR,; NCIC CTG; EORTC GCG.  Participants with recurrent platinum-sensitive ovarian cancer were randomly assigned to receive either gemcitabine-carboplatin or carboplatin every 21 days. The primary objective was to compare progression-free survival (PFS) between the groups. From September 1999 to April 2002, 356 patients (178 participants received gemcitabine-carboplatin, 178 received carboplatin only) were randomized to treatment. Patients received six cycles of either gemcitabine-carboplatin or carboplatin. With a median follow-up of 17 months, median PFS was 8.6 months for gemcitabine-carboplatin (95% confidence interval [CI] 7.9-9.7 months) and 5.8 months for carboplatin (95% CI 5.2-7.1 months; hazard ratio [HR] 0.72 [95% CI 0.58-0.90; P = 0.0032]). The response rate for the gemcitabine-carboplatin group was 47.2% (95% CI 39.9-54.5%) and 30.9% for carboplatin group (95% CI 24.1-37.7%; P = 0.0016). The HR for overall survival was 0.96 (95% CI 0.75-1.23; P  = 0.7349). Patients treated with gemcitabine-carboplatin reported significantly faster palliation of abdominal symptoms and a significantly improved global quality of life. Gemcitabine-carboplatin treatment significantly improves the PFS of patients with platinum-sensitive recurrent ovarian cancer.  We discussed the role of chemotherapy. The intent is of palliative intent.  We discussed some of the risks, benefits, side-effects of carboplatin & gemcitabine  Some of the short term side-effects included, though not limited to, including weight loss, life threatening infections, risk of allergic reactions, need for transfusions of blood products, nausea, vomiting, change in bowel habits, loss of hair, admission to hospital for various reasons, and risks of death.   Long term side-effects are also discussed including risks of infertility, permanent damage to nerve function, hearing loss, chronic fatigue, kidney damage with possibility needing hemodialysis, and rare secondary malignancy including bone marrow disorders.  The patient is aware that the response rates discussed earlier is not guaranteed.  After a long discussion, patient made an informed decision to proceed with the prescribed plan of care.   Patient education material was dispensed She will get treatment started next week, to receive treatment on day 1 and day 8, rest day 15 for cycle of 21 days. We will monitor her blood count carefully during treatment.

## 2016-08-22 NOTE — Assessment & Plan Note (Signed)
The amount of ascites is less. She will continue periodic paracentesis for relief

## 2016-08-22 NOTE — Assessment & Plan Note (Signed)
She has pain in the lower abdomen area coinciding with the areas of her peritoneal catheter I refilled her prescription pain medicine and warned her about risk of constipation

## 2016-08-22 NOTE — Assessment & Plan Note (Signed)
I have reviewed this with the patient and her daughter multiple times during this long discussion today. The patient requested some symptomatic relief with something stronger than Gas-X. I told her that the most likely cause of her symptoms is due to disease progression. Giving her medication to suppress the symptoms might cause risk of bowel obstruction. I recommend we work on giving her regular laxatives to ensure adequate bowel movement and that should also provide symptomatic relief.

## 2016-08-22 NOTE — Assessment & Plan Note (Signed)
The patient is aware she has incurable disease and treatment is strictly palliative. We discussed importance of Advanced Directives and Living will. The patient is not interested to discuss further about CODE STATUS, etc. I did warn her about potential risk of disease progression if she does not respond to treatment, and she needs to be prepared for end-of-life care

## 2016-08-25 ENCOUNTER — Telehealth: Payer: Self-pay | Admitting: *Deleted

## 2016-08-25 NOTE — Telephone Encounter (Signed)
Family member called wanting an update on her chemotherapy scheduled. She states that she is suppose to began treatment Monday but I do not see it in the disposition. Call back # [name]: [phone]

## 2016-08-28 ENCOUNTER — Telehealth: Payer: Self-pay | Admitting: Hematology and Oncology

## 2016-08-28 ENCOUNTER — Telehealth: Payer: Self-pay | Admitting: *Deleted

## 2016-08-28 NOTE — Telephone Encounter (Signed)
Left message for patient re 3/27 appointment via [facility] [phone]. Patient to get new schedule at 3/27 visit.

## 2016-08-28 NOTE — Telephone Encounter (Signed)
I have placed extensive scheduling msg when I saw her 1 week ago Please coordinate appt with schedulers

## 2016-08-28 NOTE — Telephone Encounter (Signed)
Informed family member of patient's appointment.

## 2016-08-29 ENCOUNTER — Other Ambulatory Visit (HOSPITAL_BASED_OUTPATIENT_CLINIC_OR_DEPARTMENT_OTHER): Payer: Self-pay

## 2016-08-29 ENCOUNTER — Ambulatory Visit (HOSPITAL_BASED_OUTPATIENT_CLINIC_OR_DEPARTMENT_OTHER): Payer: Self-pay

## 2016-08-29 VITALS — BP 124/71 | HR 95 | Temp 98.6°F | Resp 18

## 2016-08-29 DIAGNOSIS — C569 Malignant neoplasm of unspecified ovary: Secondary | ICD-10-CM

## 2016-08-29 DIAGNOSIS — Z452 Encounter for adjustment and management of vascular access device: Secondary | ICD-10-CM

## 2016-08-29 DIAGNOSIS — C786 Secondary malignant neoplasm of retroperitoneum and peritoneum: Secondary | ICD-10-CM

## 2016-08-29 DIAGNOSIS — Z5111 Encounter for antineoplastic chemotherapy: Secondary | ICD-10-CM

## 2016-08-29 DIAGNOSIS — C801 Malignant (primary) neoplasm, unspecified: Secondary | ICD-10-CM

## 2016-08-29 LAB — CBC WITH DIFFERENTIAL/PLATELET
BASO%: 1 % (ref 0.0–2.0)
BASOS ABS: 0.1 10*3/uL (ref 0.0–0.1)
EOS%: 1.3 % (ref 0.0–7.0)
Eosinophils Absolute: 0.1 10*3/uL (ref 0.0–0.5)
HEMATOCRIT: 38.1 % (ref 34.8–46.6)
HGB: 12.5 g/dL (ref 11.6–15.9)
LYMPH%: 19.1 % (ref 14.0–49.7)
MCH: 27.4 pg (ref 25.1–34.0)
MCHC: 32.7 g/dL (ref 31.5–36.0)
MCV: 83.5 fL (ref 79.5–101.0)
MONO#: 1 10*3/uL — ABNORMAL HIGH (ref 0.1–0.9)
MONO%: 12.4 % (ref 0.0–14.0)
NEUT#: 5.6 10*3/uL (ref 1.5–6.5)
NEUT%: 66.2 % (ref 38.4–76.8)
PLATELETS: 555 10*3/uL — AB (ref 145–400)
RBC: 4.56 10*6/uL (ref 3.70–5.45)
RDW: 17.7 % — ABNORMAL HIGH (ref 11.2–14.5)
WBC: 8.4 10*3/uL (ref 3.9–10.3)
lymph#: 1.6 10*3/uL (ref 0.9–3.3)

## 2016-08-29 LAB — COMPREHENSIVE METABOLIC PANEL
ALT: 8 U/L (ref 0–55)
ANION GAP: 11 meq/L (ref 3–11)
AST: 10 U/L (ref 5–34)
Albumin: 3.3 g/dL — ABNORMAL LOW (ref 3.5–5.0)
Alkaline Phosphatase: 103 U/L (ref 40–150)
BUN: 12.5 mg/dL (ref 7.0–26.0)
CALCIUM: 10.4 mg/dL (ref 8.4–10.4)
CHLORIDE: 101 meq/L (ref 98–109)
CO2: 26 mEq/L (ref 22–29)
Creatinine: 0.8 mg/dL (ref 0.6–1.1)
EGFR: 67 mL/min/{1.73_m2} — AB (ref >90)
Glucose: 124 mg/dl (ref 70–140)
POTASSIUM: 4.3 meq/L (ref 3.5–5.1)
Sodium: 138 mEq/L (ref 136–145)
Total Bilirubin: 0.31 mg/dL (ref 0.20–1.20)
Total Protein: 7.6 g/dL (ref 6.4–8.3)

## 2016-08-29 MED ORDER — DEXAMETHASONE SODIUM PHOSPHATE 10 MG/ML IJ SOLN
10.0000 mg | Freq: Once | INTRAMUSCULAR | Status: AC
  Administered 2016-08-29: 10 mg via INTRAVENOUS

## 2016-08-29 MED ORDER — DEXAMETHASONE SODIUM PHOSPHATE 100 MG/10ML IJ SOLN
10.0000 mg | Freq: Once | INTRAMUSCULAR | Status: DC
Start: 2016-08-29 — End: 2016-08-29

## 2016-08-29 MED ORDER — PALONOSETRON HCL INJECTION 0.25 MG/5ML
INTRAVENOUS | Status: AC
  Filled 2016-08-29: qty 5

## 2016-08-29 MED ORDER — PALONOSETRON HCL INJECTION 0.25 MG/5ML
0.2500 mg | Freq: Once | INTRAVENOUS | Status: AC
  Administered 2016-08-29: 0.25 mg via INTRAVENOUS

## 2016-08-29 MED ORDER — SODIUM CHLORIDE 0.9% FLUSH
10.0000 mL | INTRAVENOUS | Status: DC | PRN
  Administered 2016-08-29: 10 mL
  Filled 2016-08-29: qty 10

## 2016-08-29 MED ORDER — SODIUM CHLORIDE 0.9 % IV SOLN
800.0000 mg/m2 | Freq: Once | INTRAVENOUS | Status: AC
  Administered 2016-08-29: 1444 mg via INTRAVENOUS
  Filled 2016-08-29: qty 37.98

## 2016-08-29 MED ORDER — HEPARIN SOD (PORK) LOCK FLUSH 100 UNIT/ML IV SOLN
500.0000 [IU] | Freq: Once | INTRAVENOUS | Status: AC | PRN
  Administered 2016-08-29: 500 [IU]
  Filled 2016-08-29: qty 5

## 2016-08-29 MED ORDER — ALTEPLASE 2 MG IJ SOLR
2.0000 mg | Freq: Once | INTRAMUSCULAR | Status: AC | PRN
  Administered 2016-08-29: 2 mg
  Filled 2016-08-29: qty 2

## 2016-08-29 MED ORDER — SODIUM CHLORIDE 0.9 % IV SOLN
Freq: Once | INTRAVENOUS | Status: AC
  Administered 2016-08-29: 14:00:00 via INTRAVENOUS

## 2016-08-29 MED ORDER — CARBOPLATIN CHEMO INTRADERMAL TEST DOSE 100MCG/0.02ML
100.0000 ug | Freq: Once | INTRADERMAL | Status: AC
  Administered 2016-08-29: 100 ug via INTRADERMAL
  Filled 2016-08-29: qty 0.02

## 2016-08-29 MED ORDER — CARBOPLATIN CHEMO INJECTION 600 MG/60ML
403.5000 mg | Freq: Once | INTRAVENOUS | Status: AC
  Administered 2016-08-29: 400 mg via INTRAVENOUS
  Filled 2016-08-29: qty 40

## 2016-08-29 MED ORDER — DEXAMETHASONE SODIUM PHOSPHATE 10 MG/ML IJ SOLN
INTRAMUSCULAR | Status: AC
  Filled 2016-08-29: qty 1

## 2016-08-29 NOTE — Patient Instructions (Signed)
[facility] Discharge Instructions for Patients Receiving Chemotherapy  Today you received the following chemotherapy agents: Gemzar and Carboplatin.  To help prevent nausea and vomiting after your treatment, we encourage you to take your nausea medication as prescribed.  If you develop nausea and vomiting that is not controlled by your nausea medication, call the clinic.   BELOW ARE SYMPTOMS THAT SHOULD BE REPORTED IMMEDIATELY:  *FEVER GREATER THAN 100.5 F  *CHILLS WITH OR WITHOUT FEVER  NAUSEA AND VOMITING THAT IS NOT CONTROLLED WITH YOUR NAUSEA MEDICATION  *UNUSUAL SHORTNESS OF BREATH  *UNUSUAL BRUISING OR BLEEDING  TENDERNESS IN MOUTH AND THROAT WITH OR WITHOUT PRESENCE OF ULCERS  *URINARY PROBLEMS  *BOWEL PROBLEMS  UNUSUAL RASH Items with * indicate a potential emergency and should be followed up as soon as possible.  Feel free to call the clinic you have any questions or concerns. The clinic phone number is [phone].  Please show the [place] at check-in to the Emergency Department and triage nurse.  (Gemzar) Gemcitabine injection What is this medicine? GEMCITABINE (jem SIT a been) is a chemotherapy drug. This medicine is used to treat many types of cancer like breast cancer, lung cancer, pancreatic cancer, and ovarian cancer. This medicine may be used for other purposes; ask your health care provider or pharmacist if you have questions. COMMON BRAND NAME(S): Gemzar What should I tell my health care provider before I take this medicine? They need to know if you have any of these conditions: -blood disorders -infection -kidney disease -liver disease -recent or ongoing radiation therapy -an unusual or allergic reaction to gemcitabine, other chemotherapy, other medicines, foods, dyes, or preservatives -pregnant or trying to get pregnant -breast-feeding How should I use this medicine? This drug is given as an infusion into a vein. It is  administered in a hospital or clinic by a specially trained health care professional. Talk to your pediatrician regarding the use of this medicine in children. Special care may be needed. Overdosage: If you think you have taken too much of this medicine contact a poison control center or emergency room at once. NOTE: This medicine is only for you. Do not share this medicine with others. What if I miss a dose? It is important not to miss your dose. Call your doctor or health care professional if you are unable to keep an appointment. What may interact with this medicine? -medicines to increase blood counts like filgrastim, pegfilgrastim, sargramostim -some other chemotherapy drugs like cisplatin -vaccines Talk to your doctor or health care professional before taking any of these medicines: -acetaminophen -aspirin -ibuprofen -ketoprofen -naproxen This list may not describe all possible interactions. Give your health care provider a list of all the medicines, herbs, non-prescription drugs, or dietary supplements you use. Also tell them if you smoke, drink alcohol, or use illegal drugs. Some items may interact with your medicine. What should I watch for while using this medicine? Visit your doctor for checks on your progress. This drug may make you feel generally unwell. This is not uncommon, as chemotherapy can affect healthy cells as well as cancer cells. Report any side effects. Continue your course of treatment even though you feel ill unless your doctor tells you to stop. In some cases, you may be given additional medicines to help with side effects. Follow all directions for their use. Call your doctor or health care professional for advice if you get a fever, chills or sore throat, or other symptoms of a cold  or flu. Do not treat yourself. This drug decreases your body's ability to fight infections. Try to avoid being around people who are sick. This medicine may increase your risk to bruise  or bleed. Call your doctor or health care professional if you notice any unusual bleeding. Be careful brushing and flossing your teeth or using a toothpick because you may get an infection or bleed more easily. If you have any dental work done, tell your dentist you are receiving this medicine. Avoid taking products that contain aspirin, acetaminophen, ibuprofen, naproxen, or ketoprofen unless instructed by your doctor. These medicines may hide a fever. Women should inform their doctor if they wish to become pregnant or think they might be pregnant. There is a potential for serious side effects to an unborn child. Talk to your health care professional or pharmacist for more information. Do not breast-feed an infant while taking this medicine. What side effects may I notice from receiving this medicine? Side effects that you should report to your doctor or health care professional as soon as possible: -allergic reactions like skin rash, itching or hives, swelling of the face, lips, or tongue -low blood counts - this medicine may decrease the number of white blood cells, red blood cells and platelets. You may be at increased risk for infections and bleeding. -signs of infection - fever or chills, cough, sore throat, pain or difficulty passing urine -signs of decreased platelets or bleeding - bruising, pinpoint red spots on the skin, black, tarry stools, blood in the urine -signs of decreased red blood cells - unusually weak or tired, fainting spells, lightheadedness -breathing problems -chest pain -mouth sores -nausea and vomiting -pain, swelling, redness at site where injected -pain, tingling, numbness in the hands or feet -stomach pain -swelling of ankles, feet, hands -unusual bleeding Side effects that usually do not require medical attention (report to your doctor or health care professional if they continue or are bothersome): -constipation -diarrhea -hair loss -loss of appetite -stomach  upset This list may not describe all possible side effects. Call your doctor for medical advice about side effects. You may report side effects to FDA at 1-800-FDA-1088. Where should I keep my medicine? This drug is given in a hospital or clinic and will not be stored at home. NOTE: This sheet is a summary. It may not cover all possible information. If you have questions about this medicine, talk to your doctor, pharmacist, or health care provider.  2018 Elsevier/Gold Standard (2007-10-01 18:45:54)  Carboplatin injection What is this medicine? CARBOPLATIN (KAR boe pla tin) is a chemotherapy drug. It targets fast dividing cells, like cancer cells, and causes these cells to die. This medicine is used to treat ovarian cancer and many other cancers. This medicine may be used for other purposes; ask your health care provider or pharmacist if you have questions. COMMON BRAND NAME(S): Paraplatin What should I tell my health care provider before I take this medicine? They need to know if you have any of these conditions: -blood disorders -hearing problems -kidney disease -recent or ongoing radiation therapy -an unusual or allergic reaction to carboplatin, cisplatin, other chemotherapy, other medicines, foods, dyes, or preservatives -pregnant or trying to get pregnant -breast-feeding How should I use this medicine? This drug is usually given as an infusion into a vein. It is administered in a hospital or clinic by a specially trained health care professional. Talk to your pediatrician regarding the use of this medicine in children. Special care may be needed. Overdosage: If  you think you have taken too much of this medicine contact a poison control center or emergency room at once. NOTE: This medicine is only for you. Do not share this medicine with others. What if I miss a dose? It is important not to miss a dose. Call your doctor or health care professional if you are unable to keep an  appointment. What may interact with this medicine? -medicines for seizures -medicines to increase blood counts like filgrastim, pegfilgrastim, sargramostim -some antibiotics like amikacin, gentamicin, neomycin, streptomycin, tobramycin -vaccines Talk to your doctor or health care professional before taking any of these medicines: -acetaminophen -aspirin -ibuprofen -ketoprofen -naproxen This list may not describe all possible interactions. Give your health care provider a list of all the medicines, herbs, non-prescription drugs, or dietary supplements you use. Also tell them if you smoke, drink alcohol, or use illegal drugs. Some items may interact with your medicine. What should I watch for while using this medicine? Your condition will be monitored carefully while you are receiving this medicine. You will need important blood work done while you are taking this medicine. This drug may make you feel generally unwell. This is not uncommon, as chemotherapy can affect healthy cells as well as cancer cells. Report any side effects. Continue your course of treatment even though you feel ill unless your doctor tells you to stop. In some cases, you may be given additional medicines to help with side effects. Follow all directions for their use. Call your doctor or health care professional for advice if you get a fever, chills or sore throat, or other symptoms of a cold or flu. Do not treat yourself. This drug decreases your body's ability to fight infections. Try to avoid being around people who are sick. This medicine may increase your risk to bruise or bleed. Call your doctor or health care professional if you notice any unusual bleeding. Be careful brushing and flossing your teeth or using a toothpick because you may get an infection or bleed more easily. If you have any dental work done, tell your dentist you are receiving this medicine. Avoid taking products that contain aspirin, acetaminophen,  ibuprofen, naproxen, or ketoprofen unless instructed by your doctor. These medicines may hide a fever. Do not become pregnant while taking this medicine. Women should inform their doctor if they wish to become pregnant or think they might be pregnant. There is a potential for serious side effects to an unborn child. Talk to your health care professional or pharmacist for more information. Do not breast-feed an infant while taking this medicine. What side effects may I notice from receiving this medicine? Side effects that you should report to your doctor or health care professional as soon as possible: -allergic reactions like skin rash, itching or hives, swelling of the face, lips, or tongue -signs of infection - fever or chills, cough, sore throat, pain or difficulty passing urine -signs of decreased platelets or bleeding - bruising, pinpoint red spots on the skin, black, tarry stools, nosebleeds -signs of decreased red blood cells - unusually weak or tired, fainting spells, lightheadedness -breathing problems -changes in hearing -changes in vision -chest pain -high blood pressure -low blood counts - This drug may decrease the number of white blood cells, red blood cells and platelets. You may be at increased risk for infections and bleeding. -nausea and vomiting -pain, swelling, redness or irritation at the injection site -pain, tingling, numbness in the hands or feet -problems with balance, talking, walking -trouble passing urine  or change in the amount of urine Side effects that usually do not require medical attention (report to your doctor or health care professional if they continue or are bothersome): -hair loss -loss of appetite -metallic taste in the mouth or changes in taste This list may not describe all possible side effects. Call your doctor for medical advice about side effects. You may report side effects to FDA at 1-800-FDA-1088. Where should I keep my medicine? This drug  is given in a hospital or clinic and will not be stored at home. NOTE: This sheet is a summary. It may not cover all possible information. If you have questions about this medicine, talk to your doctor, pharmacist, or health care provider.  2018 Elsevier/Gold Standard (2007-08-27 14:38:05)

## 2016-09-05 ENCOUNTER — Ambulatory Visit (HOSPITAL_BASED_OUTPATIENT_CLINIC_OR_DEPARTMENT_OTHER): Payer: Self-pay | Admitting: Hematology and Oncology

## 2016-09-05 ENCOUNTER — Ambulatory Visit: Payer: Self-pay

## 2016-09-05 ENCOUNTER — Encounter: Payer: Self-pay | Admitting: Hematology and Oncology

## 2016-09-05 ENCOUNTER — Other Ambulatory Visit (HOSPITAL_BASED_OUTPATIENT_CLINIC_OR_DEPARTMENT_OTHER): Payer: Self-pay

## 2016-09-05 ENCOUNTER — Ambulatory Visit (HOSPITAL_BASED_OUTPATIENT_CLINIC_OR_DEPARTMENT_OTHER): Payer: Self-pay

## 2016-09-05 DIAGNOSIS — C786 Secondary malignant neoplasm of retroperitoneum and peritoneum: Secondary | ICD-10-CM

## 2016-09-05 DIAGNOSIS — G893 Neoplasm related pain (acute) (chronic): Secondary | ICD-10-CM

## 2016-09-05 DIAGNOSIS — C569 Malignant neoplasm of unspecified ovary: Secondary | ICD-10-CM

## 2016-09-05 DIAGNOSIS — D6481 Anemia due to antineoplastic chemotherapy: Secondary | ICD-10-CM

## 2016-09-05 DIAGNOSIS — T451X5A Adverse effect of antineoplastic and immunosuppressive drugs, initial encounter: Secondary | ICD-10-CM

## 2016-09-05 DIAGNOSIS — C801 Malignant (primary) neoplasm, unspecified: Secondary | ICD-10-CM

## 2016-09-05 DIAGNOSIS — Z5111 Encounter for antineoplastic chemotherapy: Secondary | ICD-10-CM

## 2016-09-05 LAB — CBC WITH DIFFERENTIAL/PLATELET
BASO%: 0.2 % (ref 0.0–2.0)
BASOS ABS: 0 10*3/uL (ref 0.0–0.1)
EOS ABS: 0 10*3/uL (ref 0.0–0.5)
EOS%: 0.6 % (ref 0.0–7.0)
HEMATOCRIT: 33.4 % — AB (ref 34.8–46.6)
HEMOGLOBIN: 10.9 g/dL — AB (ref 11.6–15.9)
LYMPH%: 28.5 % (ref 14.0–49.7)
MCH: 26.9 pg (ref 25.1–34.0)
MCHC: 32.6 g/dL (ref 31.5–36.0)
MCV: 82.5 fL (ref 79.5–101.0)
MONO#: 0.3 10*3/uL (ref 0.1–0.9)
MONO%: 5.7 % (ref 0.0–14.0)
NEUT#: 3.1 10*3/uL (ref 1.5–6.5)
NEUT%: 65 % (ref 38.4–76.8)
NRBC: 0 % (ref 0–0)
PLATELETS: 264 10*3/uL (ref 145–400)
RBC: 4.05 10*6/uL (ref 3.70–5.45)
RDW: 15.8 % — ABNORMAL HIGH (ref 11.2–14.5)
WBC: 4.8 10*3/uL (ref 3.9–10.3)
lymph#: 1.4 10*3/uL (ref 0.9–3.3)

## 2016-09-05 LAB — COMPREHENSIVE METABOLIC PANEL
ALBUMIN: 3.2 g/dL — AB (ref 3.5–5.0)
ALK PHOS: 95 U/L (ref 40–150)
ALT: 14 U/L (ref 0–55)
ANION GAP: 10 meq/L (ref 3–11)
AST: 14 U/L (ref 5–34)
BILIRUBIN TOTAL: 0.32 mg/dL (ref 0.20–1.20)
BUN: 13.4 mg/dL (ref 7.0–26.0)
CO2: 25 mEq/L (ref 22–29)
Calcium: 9.9 mg/dL (ref 8.4–10.4)
Chloride: 103 mEq/L (ref 98–109)
Creatinine: 0.7 mg/dL (ref 0.6–1.1)
EGFR: 84 mL/min/{1.73_m2} — AB (ref >90)
Glucose: 110 mg/dl (ref 70–140)
Potassium: 3.9 mEq/L (ref 3.5–5.1)
Sodium: 137 mEq/L (ref 136–145)
TOTAL PROTEIN: 7.3 g/dL (ref 6.4–8.3)

## 2016-09-05 MED ORDER — PROCHLORPERAZINE MALEATE 10 MG PO TABS
ORAL_TABLET | ORAL | Status: AC
  Filled 2016-09-05: qty 1

## 2016-09-05 MED ORDER — HEPARIN SOD (PORK) LOCK FLUSH 100 UNIT/ML IV SOLN
500.0000 [IU] | Freq: Once | INTRAVENOUS | Status: AC | PRN
  Administered 2016-09-05: 500 [IU]
  Filled 2016-09-05: qty 5

## 2016-09-05 MED ORDER — PROCHLORPERAZINE MALEATE 10 MG PO TABS
10.0000 mg | ORAL_TABLET | Freq: Once | ORAL | Status: AC
  Administered 2016-09-05: 10 mg via ORAL

## 2016-09-05 MED ORDER — SODIUM CHLORIDE 0.9 % IV SOLN
800.0000 mg/m2 | Freq: Once | INTRAVENOUS | Status: AC
  Administered 2016-09-05: 1444 mg via INTRAVENOUS
  Filled 2016-09-05: qty 37.98

## 2016-09-05 MED ORDER — SODIUM CHLORIDE 0.9 % IV SOLN
Freq: Once | INTRAVENOUS | Status: AC
  Administered 2016-09-05: 13:00:00 via INTRAVENOUS

## 2016-09-05 NOTE — Assessment & Plan Note (Signed)

## 2016-09-05 NOTE — Assessment & Plan Note (Signed)
She has pain in the lower abdomen area coinciding with the areas of her peritoneal catheter She will take prescription pain medicine and warned her about risk of constipation

## 2016-09-05 NOTE — Assessment & Plan Note (Signed)
She tolerated treatment well except for fatigue, lower abdominal discomfort which I do not think is related to chemotherapy and anemia We will proceed with day 8 treatment without dose adjustment I will see her back in 2 weeks prior to cycle 2 of treatment

## 2016-09-05 NOTE — Progress Notes (Signed)
[facility] OFFICE PROGRESS NOTE  Patient Care Team: No Pcp Per Patient as PCP - General (General Practice)  SUMMARY OF ONCOLOGIC HISTORY:   Ovarian carcinoma ([place])   04/19/2015 Initial Diagnosis    In late 03-2015 she in [place] and was found to have large pelvic mass and perihepatic ascites. CA 125 then was 359, with CEA of 3, CA 19-9 5.8 and alpha fetoprotein 2.3. She had exploratory laparotomy 04-19-15, not done by gyn oncologist, with bilateral ovaried an tumors, 10 cm with capsule ruptured on right , smaller on left, peritoneal carcinomatosis.       05/20/2015 Miscellaneous    She had some bowel obstruction post operatively, with NG used . Pathology reportedcystic and solid left ovarian mass, 13 cm right ovarian mass, moderately differentiated tumor "tumor de celulas de la granulosa". 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 CT AP 05-2015 had ascites, carcinomatosis and multiple peritoneal implants. CA 125 on 05-24-15 baseline for chemo was 141. She received 3 cycles of taxol + carboplatin from 05-26-15 thru 07-07-15 (not clear if avastin given those cycles), after which carboplatin was not available in [place]. She received taxol 175 mg/m2, CDDP and avastin for 3 additional cycles thru 10-07-15. Second look laparotomy was discussed, patient declined, and instead had 4 cycles avastin + taxol from 12-01-15 thru 02-02-16 (apparentlyno CDDP after 10-07-15 and no carboplatin after 07-07-15). CT CAP 02-24-16 reportedly had no evidence of malignancy, with some bullous emphysema, tortuous sigmoid colon with diverticulosis. . Recommendation was for maintenance avastin x 1 year, however patient then came to [place] to be with family.       05/20/2016 Imaging    Extensive peritoneal disease with omental thickening/ 18 and large volume ascites. 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  Trace bilateral pleural effusions. Moderate hiatal hernia. Bilateral inguinal hernias containing fat. Left colonic diverticulosis. Aortoiliac atherosclerosis.      05/20/2016 - 06/13/2016 Hospital Admission    76 year old female with a history of metastatic ovarian cancer diagnosed in January 2017 (s/p primary debulking surgery followed by 8 adjuvant cycles of carboplatin and paclitaxel and 4 additional cycles of carboplatin, paclitaxel, avastin), last chemo Sept 2017, presented with progressive abdominal bloating and pain for 3 weeks duration. She was feeling well until the first week of December, 2017 when she was sick with a virus from which she never recovered. She states that she began feeling distended in the abdomen and uncomfortable and was taken to the ED on 05/20/16. CT chest/abd/pelvis9-21-17 reportedly with no evidence of malignancy, with some bullous emphysema, tortuous sigmoid colon with diverticulosis. Recommendation was for maintenance avastin x 1 year, however patient then came to [facility] in 02[facility]2016. She has not established care with any physician since coming to [place]. Unfortunately, after paracentesis she began having increasing abdominal pain. Abdominal x-ray revealed distended small bowel loops concerning for small bowel obstruction. Gen. surgery was consulted. NG was inserted to suction. Because of prolonged Hospitalization she was placed on TPN/TNA. She underwent Chemotheray with Doxil on 12/26. Repeat U/S 12/31 showed interval progression of ascites and She is s/p repeat Paracentesis 06-06-16 which removed 4 liters. She was subsequently discharged      05/21/2016 Tumor Marker    Patient's tumor was tested for the following markers: CA125 Results of the tumor marker test revealed 249.8      05/22/2016 Procedure    Successful ultrasound-guided paracentesis yielding 2.9 liters of peritoneal fluid.  05/22/2016 Pathology Results    PERITONEAL/ASCITIC FLUID, 1 OF 1 COLLECTED ON  05/22/16: ADENOCARCINOMA. ADDENDUM: Immunohistochemistry is performed on the cell block and the tumor is strongly positive with cytokeratin 7 and cytokeratin 8/18 and shows patchy positivity with estrogen receptor, cytokeratin 5/6, WT-1 and MOC-31 with focal weak positivity with PAX-8. The tumor is negative with thyroglobulin, thyroid transcription factor-1, Napsin-A, progesterone receptor, GATA 3, gross cystic disease fluid protein, cytokeratin 20, CDX-2 and Calretinin. 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       05/24/2016 Procedure    Ultrasound and fluoroscopically guided right internal jugular single lumen power port catheter insertion. Tip in the SVC/RA junction. Catheter ready for use      05/24/2016 Imaging    LV EF: 65% -   70%      05/28/2016 Imaging    Small volume of abdominal and pelvic ascites is identified and inadequate for paracentesis.      05/30/2016 - 08/21/2016 Chemotherapy    She received doxorubicin x 3 cycles, stopped due to disease progression      06/04/2016 Imaging    Increased amount of ascites, freely distributed in all 4 quadrants. No other acute finding by ultrasound.      06/06/2016 Imaging    Small left greater than right pleural effusions, increased compared to prior CT. Interim development of partial consolidations within the bilateral lower lobes which may reflect atelectasis or pneumonia. Few scattered foci of ground-glass density within the right upper and left upper lobes, suspect small foci of inflammation or infection. 2. Moderate to large volume of ascites in the upper abdomen with extensive mesenteric and omental metastatic disease partially visualized. 3. Dilated gallbladder.      06/08/2016 Procedure    Successful ultrasound and fluoroscopic guided placement of a tunneled peritoneal drainage catheter.  2. Successful aspiration of 900 cc of serous ascites following tunneled peritoneal drainage catheter placement.      06/19/2016 Tumor Marker    Patient's tumor was tested for the following markers: CA125 Results of the tumor marker test revealed 166.6      07/24/2016 Tumor Marker    Patient's tumor was tested for the following markers: CA125 Results of the tumor marker test revealed 104.4      08/18/2016 Imaging    Extensive peritoneal metastatic disease within the abdomen. This is somewhat difficult to compare with prior given diffuse nature however there is suggestion of interval progression. Interval resolution of previously described bilateral pleural effusions. Peritoneal drainage catheter is present with tip terminating in the right upper quadrant. Interval decrease in now small volume ascites. Aortic atherosclerosis. Diverticulosis without CT evidence for acute diverticulitis.      08/18/2016 Imaging    LV EF: 60% -  65%      08/29/2016 -  Chemotherapy    She received gemzar and carboplatin       INTERVAL HISTORY: Her daughter serves as an interpreter Please see below for problem oriented charting. She is seen today prior to day 8 of treatment She tolerated chemo well Denies recent nausea, vomiting or changes in bowel habits She has minimum output through the peritoneal catheter She complained of mild discomfort around the catheter site but she does not take pain medicine regularly  REVIEW OF SYSTEMS:   Constitutional: Denies fevers, chills or abnormal weight loss Eyes: Denies blurriness of vision Ears, nose, mouth, throat, and face:  Denies mucositis or sore throat Respiratory: Denies cough, dyspnea or wheezes Cardiovascular: Denies palpitation, chest discomfort or lower extremity swelling Gastrointestinal:  Denies nausea, heartburn or change in bowel habits Skin: Denies abnormal skin rashes Lymphatics: Denies new lymphadenopathy or easy bruising Neurological:Denies  numbness, tingling or new weaknesses Behavioral/Psych: Mood is stable, no new changes  All other systems were reviewed with the patient and are negative.  I have reviewed the past medical history, past surgical history, social history and family history with the patient and they are unchanged from previous note.  ALLERGIES:  has No Known Allergies.  MEDICATIONS:  Current Outpatient Prescriptions  Medication Sig Dispense Refill  . acetaminophen (TYLENOL) 325 MG tablet Take 650 mg by mouth every 5 (five) hours as needed for moderate pain.    Marland Kitchen lidocaine-prilocaine (EMLA) cream Apply to port 1 hour before access with needle. Cover with plastic wrap. 30 g 11  . magnesium 30 MG tablet Take 30 mg by mouth daily.     Marland Kitchen oxyCODONE (OXY IR/ROXICODONE) 5 MG immediate release tablet Take 1 tablet (5 mg total) by mouth every 4 (four) hours as needed for severe pain. 60 tablet 0  . oxyCODONE-acetaminophen (PERCOCET/ROXICET) 5-325 MG tablet Take 1 tablet by mouth every 4 (four) hours as needed for moderate pain or severe pain. 10 tablet 0  . pantoprazole (PROTONIX) 40 MG tablet Take 1 tablet (40 mg total) by mouth daily. 30 tablet 1  . polyethylene glycol (MIRALAX) packet Take 17 g by mouth daily. 14 each 9  . Potassium 75 MG TABS Take 75 mg by mouth daily.    . protein supplement (UNJURY CHICKEN SOUP) POWD Take 27 g (8 oz total) by mouth 2 (two) times daily with a meal. 27 g 0  . senna-docusate (SENOKOT-S) 8.6-50 MG tablet Take 2 tablets by mouth 2 (two) times daily. 60 tablet 9  . simethicone (GAS-X) 80 MG chewable tablet Chew 160 mg by mouth every 6 (six) hours as needed for flatulence.    Marland Kitchen alum & mag hydroxide-simeth (MAALOX/MYLANTA) 200-200-20 MG/5ML suspension Take 60 mLs by mouth every 4 (four) hours as needed for indigestion. (Patient not taking: Reported on 06/19/2016) 355 mL 0  . Glycerin, Adult, 2.1 g SUPP Place 1 suppository rectally as needed for moderate constipation. (Patient not taking:  Reported on 06/19/2016) 10 suppository 0  . ondansetron (ZOFRAN ODT) 8 MG disintegrating tablet Take 1 tablet (8 mg total) by mouth every 8 (eight) hours as needed for nausea or vomiting. (Patient not taking: Reported on 06/19/2016) 30 tablet 0  . promethazine (PHENERGAN) 12.5 MG tablet Take 1 tablet (12.5 mg total) by mouth every 6 (six) hours as needed for nausea or vomiting. (Patient not taking: Reported on 06/19/2016) 15 tablet 0   No current facility-administered medications for this visit.     PHYSICAL EXAMINATION: ECOG PERFORMANCE STATUS: 1 - Symptomatic but completely ambulatory  Vitals:   09/05/16 1216  BP: (!) 108/53  Pulse: 86  Resp: 17  Temp: 98 F (36.7 C)   Filed Weights   09/05/16 1216  Weight: 159 lb (72.1 kg)    GENERAL:alert, no distress and comfortable SKIN: skin color, texture, turgor are normal, no rashes or significant lesions EYES: normal, Conjunctiva are pink and non-injected, sclera clear OROPHARYNX:no exudate, no erythema and lips, buccal mucosa, and tongue normal  NECK: supple, thyroid normal size, non-tender, without nodularity LYMPH:  no palpable lymphadenopathy in the cervical, axillary or inguinal LUNGS: clear to auscultation and percussion with normal  breathing effort HEART: regular rate & rhythm and no murmurs and no lower extremity edema ABDOMEN:abdomen soft, non-tender and normal bowel sounds.  No significant bloating Peritoneal catheter site looks okay Musculoskeletal:no cyanosis of digits and no clubbing  NEURO: alert & oriented x 3 with fluent speech, no focal motor/sensory deficits  LABORATORY DATA:  I have reviewed the data as listed    Component Value Date/Time   NA 137 09/05/2016 1141   K 3.9 09/05/2016 1141   CL 100 (L) 06/13/2016 0449   CO2 25 09/05/2016 1141   GLUCOSE 110 09/05/2016 1141   BUN 13.4 09/05/2016 1141   CREATININE 0.7 09/05/2016 1141   CALCIUM 9.9 09/05/2016 1141   PROT 7.3 09/05/2016 1141   ALBUMIN 3.2 (L)  09/05/2016 1141   AST 14 09/05/2016 1141   ALT 14 09/05/2016 1141   ALKPHOS 95 09/05/2016 1141   BILITOT 0.32 09/05/2016 1141   GFRNONAA >60 06/13/2016 0449   GFRAA >60 06/13/2016 0449    No results found for: SPEP, UPEP  Lab Results  Component Value Date   WBC 4.8 09/05/2016   NEUTROABS 3.1 09/05/2016   HGB 10.9 (L) 09/05/2016   HCT 33.4 (L) 09/05/2016   MCV 82.5 09/05/2016   PLT 264 09/05/2016      Chemistry      Component Value Date/Time   NA 137 09/05/2016 1141   K 3.9 09/05/2016 1141   CL 100 (L) 06/13/2016 0449   CO2 25 09/05/2016 1141   BUN 13.4 09/05/2016 1141   CREATININE 0.7 09/05/2016 1141      Component Value Date/Time   CALCIUM 9.9 09/05/2016 1141   ALKPHOS 95 09/05/2016 1141   AST 14 09/05/2016 1141   ALT 14 09/05/2016 1141   BILITOT 0.32 09/05/2016 1141       RADIOGRAPHIC STUDIES: I have personally reviewed the radiological images as listed and agreed with the findings in the report. Ct Chest W Contrast  Result Date: 08/18/2016 CLINICAL DATA:  Patient with history of ovarian cancer. Pleural effusion. EXAM: CT CHEST, ABDOMEN, AND PELVIS WITH CONTRAST TECHNIQUE: Multidetector CT imaging of the chest, abdomen and pelvis was performed following the standard protocol during bolus administration of intravenous contrast. CONTRAST:  122mL ISOVUE-300 IOPAMIDOL (ISOVUE-300) INJECTION 61% COMPARISON:  Chest CT 06/06/2016; abdomen pelvis CT 05/20/2016. FINDINGS: CT CHEST FINDINGS Cardiovascular: Right anterior chest wall Port-A-Cath is present with tip terminating in the superior vena cava. Normal heart size. No pericardial effusion. Aortic atherosclerosis. Mediastinum/Nodes: No enlarged axillary, mediastinal or hilar lymphadenopathy. Esophagus is normal in appearance. Lungs/Pleura: Motion artifact limits evaluation of the pulmonary parenchyma. Central airways are patent. Scattered bilateral ground-glass pulmonary opacities. Interval resolution of previously visualized  bilateral pleural effusions. No large area of pulmonary consolidation. No discrete pulmonary nodularity identified. Musculoskeletal: Thoracic spine degenerative changes. No aggressive or acute appearing osseous lesions. CT ABDOMEN PELVIS FINDINGS Hepatobiliary: Liver is normal in size and contour. 5 mm subcapsular low-attenuation lesion right hepatic lobe (image 56; series 2), too small to characterize. Pancreas: Unremarkable Spleen: Unremarkable Adrenals/Urinary Tract: The adrenal glands are normal. Kidneys enhance symmetrically with contrast. Stable subcentimeter low-attenuation lesion interpolar region left kidney. Urinary bladder is decompressed. Stomach/Bowel: Sigmoid colonic diverticulosis. No CT evidence for acute diverticulitis. No evidence for bowel obstruction. No free intraperitoneal air. Normal morphology of the stomach. Vascular/Lymphatic: Normal caliber abdominal aorta. Peripheral calcified atherosclerotic plaque. Reproductive: Status post hysterectomy. Other: Small bilateral fat containing inguinal hernias. Right lower anterior abdominal approach drainage catheter with tip terminating in the right  upper quadrant. Interval decrease in now small volume ascites predominately within the upper abdomen about the liver and spleen. There is slight interval increase in soft tissue nodularity throughout the abdomen/peritoneal most compatible with peritoneal metastatic disease. Given the extensive nature of the peritoneal metastatic disease, comparison is somewhat difficult. Within the central abdomen on coronal images this measures up to 3.1 cm in thickness, previously 2.4 cm (image 38; series 5). Reference new 1.5 cm nodule in the pelvis (image 76; series 5). New small nodularity about the hepatic dome (image 62; series 6). Musculoskeletal: Lumbar spine degenerative changes. No aggressive or acute appearing osseous lesions. IMPRESSION: Extensive peritoneal metastatic disease within the abdomen. This is  somewhat difficult to compare with prior given diffuse nature however there is suggestion of interval progression. Interval resolution of previously described bilateral pleural effusions. Peritoneal drainage catheter is present with tip terminating in the right upper quadrant. Interval decrease in now small volume ascites. Aortic atherosclerosis. Diverticulosis without CT evidence for acute diverticulitis. Electronically Signed   By: Lovey Newcomer M.D.   On: 08/18/2016 10:59   Ct Abdomen Pelvis W Contrast  Result Date: 08/18/2016 CLINICAL DATA:  Patient with history of ovarian cancer. Pleural effusion. EXAM: CT CHEST, ABDOMEN, AND PELVIS WITH CONTRAST TECHNIQUE: Multidetector CT imaging of the chest, abdomen and pelvis was performed following the standard protocol during bolus administration of intravenous contrast. CONTRAST:  146mL ISOVUE-300 IOPAMIDOL (ISOVUE-300) INJECTION 61% COMPARISON:  Chest CT 06/06/2016; abdomen pelvis CT 05/20/2016. FINDINGS: CT CHEST FINDINGS Cardiovascular: Right anterior chest wall Port-A-Cath is present with tip terminating in the superior vena cava. Normal heart size. No pericardial effusion. Aortic atherosclerosis. Mediastinum/Nodes: No enlarged axillary, mediastinal or hilar lymphadenopathy. Esophagus is normal in appearance. Lungs/Pleura: Motion artifact limits evaluation of the pulmonary parenchyma. Central airways are patent. Scattered bilateral ground-glass pulmonary opacities. Interval resolution of previously visualized bilateral pleural effusions. No large area of pulmonary consolidation. No discrete pulmonary nodularity identified. Musculoskeletal: Thoracic spine degenerative changes. No aggressive or acute appearing osseous lesions. CT ABDOMEN PELVIS FINDINGS Hepatobiliary: Liver is normal in size and contour. 5 mm subcapsular low-attenuation lesion right hepatic lobe (image 56; series 2), too small to characterize. Pancreas: Unremarkable Spleen: Unremarkable  Adrenals/Urinary Tract: The adrenal glands are normal. Kidneys enhance symmetrically with contrast. Stable subcentimeter low-attenuation lesion interpolar region left kidney. Urinary bladder is decompressed. Stomach/Bowel: Sigmoid colonic diverticulosis. No CT evidence for acute diverticulitis. No evidence for bowel obstruction. No free intraperitoneal air. Normal morphology of the stomach. Vascular/Lymphatic: Normal caliber abdominal aorta. Peripheral calcified atherosclerotic plaque. Reproductive: Status post hysterectomy. Other: Small bilateral fat containing inguinal hernias. Right lower anterior abdominal approach drainage catheter with tip terminating in the right upper quadrant. Interval decrease in now small volume ascites predominately within the upper abdomen about the liver and spleen. There is slight interval increase in soft tissue nodularity throughout the abdomen/peritoneal most compatible with peritoneal metastatic disease. Given the extensive nature of the peritoneal metastatic disease, comparison is somewhat difficult. Within the central abdomen on coronal images this measures up to 3.1 cm in thickness, previously 2.4 cm (image 38; series 5). Reference new 1.5 cm nodule in the pelvis (image 76; series 5). New small nodularity about the hepatic dome (image 62; series 6). Musculoskeletal: Lumbar spine degenerative changes. No aggressive or acute appearing osseous lesions. IMPRESSION: Extensive peritoneal metastatic disease within the abdomen. This is somewhat difficult to compare with prior given diffuse nature however there is suggestion of interval progression. Interval resolution of previously described bilateral pleural effusions. Peritoneal  drainage catheter is present with tip terminating in the right upper quadrant. Interval decrease in now small volume ascites. Aortic atherosclerosis. Diverticulosis without CT evidence for acute diverticulitis. Electronically Signed   By: Lovey Newcomer M.D.    On: 08/18/2016 10:59    ASSESSMENT & PLAN:  Ovarian carcinoma ([place]) She tolerated treatment well except for fatigue, lower abdominal discomfort which I do not think is related to chemotherapy and anemia We will proceed with day 8 treatment without dose adjustment I will see her back in 2 weeks prior to cycle 2 of treatment  Anemia due to antineoplastic chemotherapy This is likely due to recent treatment. The patient denies recent history of bleeding such as epistaxis, hematuria or hematochezia. She is asymptomatic from the anemia. I will observe for now.  She does not require transfusion now. I will continue the chemotherapy at current dose without dosage adjustment.  If the anemia gets progressive worse in the future, I might have to delay her treatment or adjust the chemotherapy dose.   Cancer associated pain She has pain in the lower abdomen area coinciding with the areas of her peritoneal catheter She will take prescription pain medicine and warned her about risk of constipation   No orders of the defined types were placed in this encounter.  All questions were answered. The patient knows to call the clinic with any problems, questions or concerns. No barriers to learning was detected. I spent 15 minutes counseling the patient face to face. The total time spent in the appointment was 20 minutes and more than 50% was on counseling and review of test results     Heath Lark, MD 09/05/2016 1:56 PM

## 2016-09-05 NOTE — Patient Instructions (Addendum)
[facility] Discharge Instructions for Patients Receiving Chemotherapy  Today you received the following chemotherapy agents: Gemzar   To help prevent nausea and vomiting after your treatment, we encourage you to take your nausea medication as prescribed.  If you develop nausea and vomiting that is not controlled by your nausea medication, call the clinic.   BELOW ARE SYMPTOMS THAT SHOULD BE REPORTED IMMEDIATELY:  *FEVER GREATER THAN 100.5 F  *CHILLS WITH OR WITHOUT FEVER  NAUSEA AND VOMITING THAT IS NOT CONTROLLED WITH YOUR NAUSEA MEDICATION  *UNUSUAL SHORTNESS OF BREATH  *UNUSUAL BRUISING OR BLEEDING  TENDERNESS IN MOUTH AND THROAT WITH OR WITHOUT PRESENCE OF ULCERS  *URINARY PROBLEMS  *BOWEL PROBLEMS  UNUSUAL RASH Items with * indicate a potential emergency and should be followed up as soon as possible.  Feel free to call the clinic you have any questions or concerns. The clinic phone number is [phone].  Please show the [facility] at check-in to the Emergency Department and triage nurse.  (Gemzar) Gemcitabine injection What is this medicine? GEMCITABINE (jem SIT a been) is a chemotherapy drug. This medicine is used to treat many types of cancer like breast cancer, lung cancer, pancreatic cancer, and ovarian cancer. This medicine may be used for other purposes; ask your health care provider or pharmacist if you have questions. COMMON BRAND NAME(S): Gemzar What should I tell my health care provider before I take this medicine? They need to know if you have any of these conditions: -blood disorders -infection -kidney disease -liver disease -recent or ongoing radiation therapy -an unusual or allergic reaction to gemcitabine, other chemotherapy, other medicines, foods, dyes, or preservatives -pregnant or trying to get pregnant -breast-feeding How should I use this medicine? This drug is given as an infusion into a vein. It is administered in  a hospital or clinic by a specially trained health care professional. Talk to your pediatrician regarding the use of this medicine in children. Special care may be needed. Overdosage: If you think you have taken too much of this medicine contact a poison control center or emergency room at once. NOTE: This medicine is only for you. Do not share this medicine with others. What if I miss a dose? It is important not to miss your dose. Call your doctor or health care professional if you are unable to keep an appointment. What may interact with this medicine? -medicines to increase blood counts like filgrastim, pegfilgrastim, sargramostim -some other chemotherapy drugs like cisplatin -vaccines Talk to your doctor or health care professional before taking any of these medicines: -acetaminophen -aspirin -ibuprofen -ketoprofen -naproxen This list may not describe all possible interactions. Give your health care provider a list of all the medicines, herbs, non-prescription drugs, or dietary supplements you use. Also tell them if you smoke, drink alcohol, or use illegal drugs. Some items may interact with your medicine. What should I watch for while using this medicine? Visit your doctor for checks on your progress. This drug may make you feel generally unwell. This is not uncommon, as chemotherapy can affect healthy cells as well as cancer cells. Report any side effects. Continue your course of treatment even though you feel ill unless your doctor tells you to stop. In some cases, you may be given additional medicines to help with side effects. Follow all directions for their use. Call your doctor or health care professional for advice if you get a fever, chills or sore throat, or other symptoms of a cold or  flu. Do not treat yourself. This drug decreases your body's ability to fight infections. Try to avoid being around people who are sick. This medicine may increase your risk to bruise or bleed. Call  your doctor or health care professional if you notice any unusual bleeding. Be careful brushing and flossing your teeth or using a toothpick because you may get an infection or bleed more easily. If you have any dental work done, tell your dentist you are receiving this medicine. Avoid taking products that contain aspirin, acetaminophen, ibuprofen, naproxen, or ketoprofen unless instructed by your doctor. These medicines may hide a fever. Women should inform their doctor if they wish to become pregnant or think they might be pregnant. There is a potential for serious side effects to an unborn child. Talk to your health care professional or pharmacist for more information. Do not breast-feed an infant while taking this medicine. What side effects may I notice from receiving this medicine? Side effects that you should report to your doctor or health care professional as soon as possible: -allergic reactions like skin rash, itching or hives, swelling of the face, lips, or tongue -low blood counts - this medicine may decrease the number of white blood cells, red blood cells and platelets. You may be at increased risk for infections and bleeding. -signs of infection - fever or chills, cough, sore throat, pain or difficulty passing urine -signs of decreased platelets or bleeding - bruising, pinpoint red spots on the skin, black, tarry stools, blood in the urine -signs of decreased red blood cells - unusually weak or tired, fainting spells, lightheadedness -breathing problems -chest pain -mouth sores -nausea and vomiting -pain, swelling, redness at site where injected -pain, tingling, numbness in the hands or feet -stomach pain -swelling of ankles, feet, hands -unusual bleeding Side effects that usually do not require medical attention (report to your doctor or health care professional if they continue or are bothersome): -constipation -diarrhea -hair loss -loss of appetite -stomach upset This  list may not describe all possible side effects. Call your doctor for medical advice about side effects. You may report side effects to FDA at 1-800-FDA-1088. Where should I keep my medicine? This drug is given in a hospital or clinic and will not be stored at home. NOTE: This sheet is a summary. It may not cover all possible information. If you have questions about this medicine, talk to your doctor, pharmacist, or health care provider.  2018 Elsevier/Gold Standard (2007-10-01 18:45:54)

## 2016-09-06 LAB — CA 125: CANCER ANTIGEN (CA) 125: 48.1 U/mL — AB (ref 0.0–38.1)

## 2016-09-11 ENCOUNTER — Telehealth: Payer: Self-pay | Admitting: *Deleted

## 2016-09-11 ENCOUNTER — Other Ambulatory Visit: Payer: Self-pay | Admitting: Hematology and Oncology

## 2016-09-11 DIAGNOSIS — C786 Secondary malignant neoplasm of retroperitoneum and peritoneum: Secondary | ICD-10-CM

## 2016-09-11 DIAGNOSIS — C801 Malignant (primary) neoplasm, unspecified: Principal | ICD-10-CM

## 2016-09-11 NOTE — Telephone Encounter (Signed)
Daughter reports the last 3 times they have tried to empty tube, nothing is coming out. Think it needs to be taken out as they discussed with Dr Alvy Bimler.

## 2016-09-11 NOTE — Telephone Encounter (Signed)
Daughter notified 

## 2016-09-11 NOTE — Telephone Encounter (Signed)
She is responding to chemo I will place order for IR to remove

## 2016-09-12 ENCOUNTER — Telehealth: Payer: Self-pay | Admitting: *Deleted

## 2016-09-12 MED FILL — PANTOPRAZOLE SOD DR 40 MG T: 40 | 30 days supply | Qty: 30 | Fill #1

## 2016-09-12 NOTE — Telephone Encounter (Signed)
Dtr. Is calling.  She would like to check on her mother's appointments.  Reviewed appointments for 09/18/16 with Belenda Cruise, dtr.  Dtr. Is also concerned as she called yesterday about having her mother's 'tube' removed because she is in a lot of pain.  Dr. Alvy Bimler said she would put in an order to have the tube removed but they have not heard anything.  Dtr. Is asking can she take her mother to the emergency room to have the tube removed.  She is doing fine otherwise, except for the pain caused by the tube.  Let her know that the tube will not be removed in the emergency room, but rather in radiology.  Let her know that I would let Dr. Alvy Bimler and her nurse know that she has not heard anything about an appointment to remove the tube.  She appreciated any help we can give to make this happen faster. Her call back is [phone]

## 2016-09-12 NOTE — Telephone Encounter (Signed)
I have placed orders for tube to be removed Please call IR directly for appt

## 2016-09-14 ENCOUNTER — Other Ambulatory Visit: Payer: Self-pay

## 2016-09-14 ENCOUNTER — Telehealth: Payer: Self-pay

## 2016-09-14 MED ORDER — LACTULOSE 10 GM/15ML PO SOLN: ORAL | 0 refills | Status: DC

## 2016-09-14 MED FILL — LACTULOSE 10 GM/15 ML SOLN: 10 | 8 days supply | Qty: 240 | Fill #0

## 2016-09-14 NOTE — Telephone Encounter (Signed)
Continue 1) Miralax daily 2) Sennokot 2 tabs BID PO 3) call in Lactulose 10 ml TID PO

## 2016-09-14 NOTE — Telephone Encounter (Signed)
Daughter called for advice, her mother is constipated. Patient has not had a BM in 4 days. She is not eating much per daughter. She is taking Miralax 2 x day and Senokot 2 x day. Daughter gave her mother a Doculax suppository yesterday with no results.

## 2016-09-14 NOTE — Telephone Encounter (Signed)
Called with below message to daughter, verbalized understanding.

## 2016-09-18 ENCOUNTER — Ambulatory Visit: Payer: Medicaid Other | Admitting: Hematology and Oncology

## 2016-09-18 ENCOUNTER — Other Ambulatory Visit: Payer: Self-pay | Admitting: Hematology and Oncology

## 2016-09-18 ENCOUNTER — Other Ambulatory Visit: Payer: Self-pay | Admitting: Radiology

## 2016-09-18 ENCOUNTER — Ambulatory Visit: Payer: Medicaid Other

## 2016-09-18 ENCOUNTER — Other Ambulatory Visit: Payer: Medicaid Other

## 2016-09-18 ENCOUNTER — Telehealth: Payer: Self-pay

## 2016-09-18 NOTE — Telephone Encounter (Signed)
Pt will skip all appts today. Will come on 4/23

## 2016-09-18 NOTE — Telephone Encounter (Signed)
Infusion room is overbooked this week  Even if I can see her later in the week, she will not get treatment this week If she missed her appt today, Her next dose would be 4/23 Let me know if this acceptable

## 2016-09-18 NOTE — Telephone Encounter (Signed)
Daughter called that pt was originally constipated and used laxatives yesterday. She got crampy and had diarrhea x3 last night. She feels to weak and tired to come to appt today. Requesting r/s to later this week.

## 2016-09-18 NOTE — Telephone Encounter (Signed)
I will have to modify her plan Next week 4/23 would be day 1 The following week, 4/30 I am overbooked. So I would have to see her and move her chemo to 5/1 for day 8 I will place scheduling msg

## 2016-09-19 ENCOUNTER — Telehealth: Payer: Self-pay | Admitting: Hematology and Oncology

## 2016-09-19 ENCOUNTER — Other Ambulatory Visit: Payer: Self-pay | Admitting: Radiology

## 2016-09-19 NOTE — Telephone Encounter (Signed)
Appointments scheduled per 4.17.18 scheduling message. Patient notified.

## 2016-09-20 ENCOUNTER — Ambulatory Visit (HOSPITAL_COMMUNITY)
Admission: RE | Admit: 2016-09-20 | Discharge: 2016-09-20 | Disposition: A | Discharge status: Home / Self Care | Payer: Self-pay | Source: Ambulatory Visit | Attending: Hematology and Oncology | Admitting: Hematology and Oncology

## 2016-09-20 ENCOUNTER — Encounter (HOSPITAL_COMMUNITY): Payer: Self-pay

## 2016-09-20 DIAGNOSIS — Z4803 Encounter for change or removal of drains: Secondary | ICD-10-CM | POA: Insufficient documentation

## 2016-09-20 DIAGNOSIS — R180 Malignant ascites: Secondary | ICD-10-CM | POA: Insufficient documentation

## 2016-09-20 DIAGNOSIS — C786 Secondary malignant neoplasm of retroperitoneum and peritoneum: Secondary | ICD-10-CM | POA: Insufficient documentation

## 2016-09-20 DIAGNOSIS — C801 Malignant (primary) neoplasm, unspecified: Secondary | ICD-10-CM

## 2016-09-20 DIAGNOSIS — C569 Malignant neoplasm of unspecified ovary: Secondary | ICD-10-CM | POA: Insufficient documentation

## 2016-09-20 HISTORY — PX: IR REMOVAL PERM PERITONEAL CATH: IMG2329

## 2016-09-20 LAB — CBC WITH DIFFERENTIAL/PLATELET
BASOS PCT: 1 %
Basophils Absolute: 0 10*3/uL (ref 0.0–0.1)
EOS ABS: 0 10*3/uL (ref 0.0–0.7)
EOS PCT: 1 %
HCT: 29.3 % — ABNORMAL LOW (ref 36.0–46.0)
HEMOGLOBIN: 9.4 g/dL — AB (ref 12.0–15.0)
Lymphocytes Relative: 32 %
Lymphs Abs: 2 10*3/uL (ref 0.7–4.0)
MCH: 26.5 pg (ref 26.0–34.0)
MCHC: 32.1 g/dL (ref 30.0–36.0)
MCV: 82.5 fL (ref 78.0–100.0)
MONOS PCT: 16 %
Monocytes Absolute: 1 10*3/uL (ref 0.1–1.0)
NEUTROS PCT: 50 %
Neutro Abs: 3.1 10*3/uL (ref 1.7–7.7)
PLATELETS: 644 10*3/uL — AB (ref 150–400)
RBC: 3.55 MIL/uL — ABNORMAL LOW (ref 3.87–5.11)
RDW: 16.8 % — AB (ref 11.5–15.5)
WBC: 6.2 10*3/uL (ref 4.0–10.5)

## 2016-09-20 LAB — BASIC METABOLIC PANEL
Anion gap: 9 (ref 5–15)
BUN: 15 mg/dL (ref 6–20)
CALCIUM: 9.1 mg/dL (ref 8.9–10.3)
CO2: 26 mmol/L (ref 22–32)
CREATININE: 0.56 mg/dL (ref 0.44–1.00)
Chloride: 100 mmol/L — ABNORMAL LOW (ref 101–111)
GFR calc non Af Amer: >60 mL/min (ref >60)
Glucose, Bld: 121 mg/dL — ABNORMAL HIGH (ref 65–99)
Potassium: 3.9 mmol/L (ref 3.5–5.1)
SODIUM: 135 mmol/L (ref 135–145)

## 2016-09-20 LAB — PROTIME-INR
INR: 1
PROTHROMBIN TIME: 13.2 s (ref 11.4–15.2)

## 2016-09-20 MED ORDER — LIDOCAINE HCL 1 % IJ SOLN
INTRAMUSCULAR | Status: AC
  Filled 2016-09-20: qty 20

## 2016-09-20 MED ORDER — MIDAZOLAM HCL 2 MG/2ML IJ SOLN
INTRAMUSCULAR | Status: AC
  Filled 2016-09-20: qty 6

## 2016-09-20 MED ORDER — SODIUM CHLORIDE 0.9 % IV SOLN
INTRAVENOUS | Status: DC
  Administered 2016-09-20: 13:00:00 via INTRAVENOUS

## 2016-09-20 MED ORDER — FENTANYL CITRATE (PF) 100 MCG/2ML IJ SOLN
INTRAMUSCULAR | Status: AC | PRN
  Administered 2016-09-20 (×2): 50 ug via INTRAVENOUS

## 2016-09-20 MED ORDER — FENTANYL CITRATE (PF) 100 MCG/2ML IJ SOLN
INTRAMUSCULAR | Status: AC
  Filled 2016-09-20: qty 4

## 2016-09-20 MED ORDER — CEFAZOLIN SODIUM-DEXTROSE 2-4 GM/100ML-% IV SOLN
2.0000 g | INTRAVENOUS | Status: AC
  Administered 2016-09-20: 2 g via INTRAVENOUS

## 2016-09-20 MED ORDER — CEFAZOLIN SODIUM-DEXTROSE 2-4 GM/100ML-% IV SOLN
INTRAVENOUS | Status: AC
  Filled 2016-09-20: qty 100

## 2016-09-20 MED ORDER — HEPARIN SOD (PORK) LOCK FLUSH 100 UNIT/ML IV SOLN
500.0000 [IU] | INTRAVENOUS | Status: AC | PRN
  Administered 2016-09-20: 500 [IU]
  Filled 2016-09-20: qty 5

## 2016-09-20 MED ORDER — MIDAZOLAM HCL 2 MG/2ML IJ SOLN
INTRAMUSCULAR | Status: AC | PRN
  Administered 2016-09-20 (×2): 1 mg via INTRAVENOUS

## 2016-09-20 NOTE — Discharge Instructions (Addendum)
Moderate Conscious Sedation, Adult, Care After These instructions provide you with information about caring for yourself after your procedure. Your health care provider may also give you more specific instructions. Your treatment has been planned according to current medical practices, but problems sometimes occur. Call your health care provider if you have any problems or questions after your procedure. What can I expect after the procedure? After your procedure, it is common:  To feel sleepy for several hours.  To feel clumsy and have poor balance for several hours.  To have poor judgment for several hours.  To vomit if you eat too soon. Follow these instructions at home: For at least 24 hours after the procedure:    Do not:  Participate in activities where you could fall or become injured.  Drive.  Use heavy machinery.  Drink alcohol.  Take sleeping pills or medicines that cause drowsiness.  Make important decisions or sign legal documents.  Take care of children on your own.  Rest. Eating and drinking   Follow the diet recommended by your health care provider.  If you vomit:  Drink water, juice, or soup when you can drink without vomiting.  Make sure you have little or no nausea before eating solid foods. General instructions   Have a responsible adult stay with you until you are awake and alert.  Take over-the-counter and prescription medicines only as told by your health care provider.  If you smoke, do not smoke without supervision.  Keep all follow-up visits as told by your health care provider. This is important. Contact a health care provider if:  You keep feeling nauseous or you keep vomiting.  You feel light-headed.  You develop a rash.  You have a fever. Get help right away if:  You have trouble breathing. This information is not intended to replace advice given to you by your health care provider. Make sure you discuss any questions you  have with your health care provider. Document Released: 03/12/2013 Document Revised: 10/25/2015 Document Reviewed: 09/11/2015 Elsevier Interactive Patient Education  2017 Agency.   May remove dressing after 4:00 pm tomorrow, may replace with new one if needed.

## 2016-09-20 NOTE — H&P (Signed)
Chief Complaint: ovarian cancer  Referring Physician:Dr. Heath Lark  Supervising Physician: Audrey Garza  Patient Status: [facility] - Out-pt  HPI: Audrey Garza is an 76 y.o. female from [place] who was found to have a large pelvic mass.  This was found to be ovarian cancer.  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  She has subsequently undergone chemotherapy etc.  Early this year in 2018, she began developing some ascites and had a paracentesis.  She had about 3L removed.  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  She ultimately had a PleurX catheter placed on 06-08-16.  She was initially using this, but recently it has been a source of some pain and over the last 3 weeks she has had no fluid when attempting to drain.  A request has been made for removal.  Past Medical History:  Past Medical History:  Diagnosis Date  . Cancer [facility])     Past Surgical History:  Past Surgical History:  Procedure Laterality Date  . ABDOMINAL HYSTERECTOMY    . IR GENERIC HISTORICAL  05/24/2016   IR FLUORO GUIDE PORT INSERTION RIGHT 05/24/2016 Greggory Keen, MD [facility]INTER[facility]  . IR GENERIC HISTORICAL  05/24/2016   IR US GUIDE VASC ACCESS RIGHT 05/24/2016 Greggory Keen, MD [facility]  . IR GENERIC HISTORICAL  06/08/2016   IR PERC TUN PERIT CATH WO PORT S&I Dartha Lodge 06/08/2016 Sandi Mariscal, MD [facility]    Family History: History reviewed. No pertinent family history.  Social History:  reports that she has never smoked. She has never used smokeless tobacco. She reports that she does not drink alcohol. Her drug history is not on file.  Allergies: No Known Allergies  Medications: Medications reviewed in epic  Please HPI for pertinent positives, otherwise complete 10 system ROS negative.  Mallampati Score: MD Evaluation Airway: WNL Heart: WNL Abdomen: Other (comments) Abdomen comments: PleurX catheter in RMQ Chest/ Lungs: Other  (comments) Chest/ lungs comments: PAC in RUC ASA  Classification: 3 Mallampati/Airway Score: Two  Physical Exam: BP 122/66 (BP Location: Right Arm)   Pulse 75   Temp 98.7 F (37.1 C) (Oral)   Resp 16   Wt 156 lb (70.8 kg)   SpO2 100%   BMI 27.63 kg/m  Body mass index is 27.63 kg/m. General: pleasant, Hispanic female who is laying in bed in NAD HEENT: head is normocephalic, atraumatic.  Sclera are noninjected.  PERRL.  Ears and nose without any masses or lesions.  Mouth is pink and moist Heart: regular, rate, and rhythm.  Normal s1,s2. No obvious murmurs, gallops, or rubs noted.  Lungs: CTAB, no wheezes, rhonchi, or rales noted.  Respiratory effort nonlabored.  Right upper chest with accessed PAC in place Abd: soft, NT, except around PleurX catheter in RLQ, ND, +BS, no masses, hernias, or organomegaly Psych: A&Ox3 with an appropriate affect.   Labs: Pending   Imaging: No results found.  Assessment/Plan 1. Ovarian cancer with abdominal pleurX in place We will plan to remove this PleurX catheter today as it is causing her discomfort and she no longer has any ascites to drain.   Vitals have been reviewed.  Labs are currently pending Prophylactic abx has been ordered as well. The procedure, risks, and complications, including, but not limited to bleeding or infection were discussed via an interpretor with the patient.  She understands and is agreeable to proceed.  Thank you for this interesting consult.  I  greatly enjoyed meeting Dareth Andrew and look forward to participating in their care.  A copy of this report was sent to the requesting provider on this date.  Electronically Signed: Henreitta Cea 09/20/2016, 1:06 PM   I spent a total of    25 Minutes in face to face in clinical consultation, greater than 50% of which was counseling/coordinating care for ovarian cancer

## 2016-09-20 NOTE — Procedures (Signed)
Interventional Radiology Procedure Note  Procedure: Removal of tunneled peritoneal catheter.  Complications: None Recommendations:  - Ok to shower tomorrow - Do not submerge for 7 days - Routine wound care - 1 hour discharge   Signed,  Dulcy Fanny. Earleen Newport, DO

## 2016-09-25 ENCOUNTER — Ambulatory Visit: Payer: Self-pay

## 2016-09-25 ENCOUNTER — Other Ambulatory Visit (HOSPITAL_BASED_OUTPATIENT_CLINIC_OR_DEPARTMENT_OTHER): Payer: Self-pay

## 2016-09-25 ENCOUNTER — Other Ambulatory Visit: Payer: Self-pay | Admitting: Hematology and Oncology

## 2016-09-25 ENCOUNTER — Ambulatory Visit (HOSPITAL_BASED_OUTPATIENT_CLINIC_OR_DEPARTMENT_OTHER): Payer: Self-pay

## 2016-09-25 VITALS — BP 133/67 | HR 79 | Temp 98.3°F | Resp 16

## 2016-09-25 DIAGNOSIS — C786 Secondary malignant neoplasm of retroperitoneum and peritoneum: Secondary | ICD-10-CM

## 2016-09-25 DIAGNOSIS — C569 Malignant neoplasm of unspecified ovary: Secondary | ICD-10-CM

## 2016-09-25 DIAGNOSIS — Z95828 Presence of other vascular implants and grafts: Secondary | ICD-10-CM

## 2016-09-25 DIAGNOSIS — C801 Malignant (primary) neoplasm, unspecified: Secondary | ICD-10-CM

## 2016-09-25 DIAGNOSIS — Z5111 Encounter for antineoplastic chemotherapy: Secondary | ICD-10-CM

## 2016-09-25 LAB — CBC WITH DIFFERENTIAL/PLATELET
BASO%: 0.7 % (ref 0.0–2.0)
BASOS ABS: 0 10*3/uL (ref 0.0–0.1)
EOS%: 0.5 % (ref 0.0–7.0)
Eosinophils Absolute: 0 10*3/uL (ref 0.0–0.5)
HEMATOCRIT: 32.3 % — AB (ref 34.8–46.6)
HEMOGLOBIN: 10.4 g/dL — AB (ref 11.6–15.9)
LYMPH#: 1.8 10*3/uL (ref 0.9–3.3)
LYMPH%: 30.1 % (ref 14.0–49.7)
MCH: 26.9 pg (ref 25.1–34.0)
MCHC: 32.2 g/dL (ref 31.5–36.0)
MCV: 83.7 fL (ref 79.5–101.0)
MONO#: 1.1 10*3/uL — ABNORMAL HIGH (ref 0.1–0.9)
MONO%: 18.3 % — AB (ref 0.0–14.0)
NEUT%: 50.4 % (ref 38.4–76.8)
NEUTROS ABS: 2.9 10*3/uL (ref 1.5–6.5)
Platelets: 570 10*3/uL — ABNORMAL HIGH (ref 145–400)
RBC: 3.86 10*6/uL (ref 3.70–5.45)
RDW: 17.1 % — AB (ref 11.2–14.5)
WBC: 5.8 10*3/uL (ref 3.9–10.3)

## 2016-09-25 LAB — COMPREHENSIVE METABOLIC PANEL
ALK PHOS: 111 U/L (ref 40–150)
ALT: 11 U/L (ref 0–55)
AST: 15 U/L (ref 5–34)
Albumin: 3.1 g/dL — ABNORMAL LOW (ref 3.5–5.0)
Anion Gap: 11 mEq/L (ref 3–11)
BUN: 13.7 mg/dL (ref 7.0–26.0)
CALCIUM: 9.8 mg/dL (ref 8.4–10.4)
CO2: 25 mEq/L (ref 22–29)
Chloride: 104 mEq/L (ref 98–109)
Creatinine: 0.8 mg/dL (ref 0.6–1.1)
EGFR: 77 mL/min/{1.73_m2} — AB (ref >90)
GLUCOSE: 122 mg/dL (ref 70–140)
Potassium: 3.9 mEq/L (ref 3.5–5.1)
SODIUM: 140 meq/L (ref 136–145)
TOTAL PROTEIN: 7.6 g/dL (ref 6.4–8.3)

## 2016-09-25 MED ORDER — PALONOSETRON HCL INJECTION 0.25 MG/5ML
INTRAVENOUS | Status: AC
  Filled 2016-09-25: qty 5

## 2016-09-25 MED ORDER — SODIUM CHLORIDE 0.9% FLUSH
10.0000 mL | Freq: Once | INTRAVENOUS | Status: AC
  Administered 2016-09-25: 10 mL
  Filled 2016-09-25: qty 10

## 2016-09-25 MED ORDER — SODIUM CHLORIDE 0.9 % IV SOLN
800.0000 mg/m2 | Freq: Once | INTRAVENOUS | Status: AC
  Administered 2016-09-25: 1444 mg via INTRAVENOUS
  Filled 2016-09-25: qty 37.98

## 2016-09-25 MED ORDER — SODIUM CHLORIDE 0.9 % IV SOLN
Freq: Once | INTRAVENOUS | Status: AC
  Administered 2016-09-25: 15:00:00 via INTRAVENOUS

## 2016-09-25 MED ORDER — PALONOSETRON HCL INJECTION 0.25 MG/5ML
0.2500 mg | Freq: Once | INTRAVENOUS | Status: AC
  Administered 2016-09-25: 0.25 mg via INTRAVENOUS

## 2016-09-25 MED ORDER — OXYCODONE HCL 5 MG PO TABS: 5.0000 mg | ORAL_TABLET | ORAL | 0 refills | Status: DC | PRN

## 2016-09-25 MED ORDER — SODIUM CHLORIDE 0.9 % IV SOLN
403.5000 mg | Freq: Once | INTRAVENOUS | Status: AC
  Administered 2016-09-25: 400 mg via INTRAVENOUS
  Filled 2016-09-25: qty 40

## 2016-09-25 MED ORDER — DEXAMETHASONE SODIUM PHOSPHATE 10 MG/ML IJ SOLN
10.0000 mg | Freq: Once | INTRAMUSCULAR | Status: AC
  Administered 2016-09-25: 10 mg via INTRAVENOUS

## 2016-09-25 MED ORDER — CARBOPLATIN CHEMO INTRADERMAL TEST DOSE 100MCG/0.02ML
100.0000 ug | Freq: Once | INTRADERMAL | Status: AC
  Administered 2016-09-25: 100 ug via INTRADERMAL
  Filled 2016-09-25: qty 0.02

## 2016-09-25 MED ORDER — HEPARIN SOD (PORK) LOCK FLUSH 100 UNIT/ML IV SOLN
500.0000 [IU] | Freq: Once | INTRAVENOUS | Status: AC | PRN
  Administered 2016-09-25: 500 [IU]
  Filled 2016-09-25: qty 5

## 2016-09-25 MED ORDER — SODIUM CHLORIDE 0.9% FLUSH
10.0000 mL | INTRAVENOUS | Status: DC | PRN
  Administered 2016-09-25: 10 mL
  Filled 2016-09-25: qty 10

## 2016-09-25 MED ORDER — MIRTAZAPINE 15 MG PO TABS: 15.0000 mg | ORAL_TABLET | Freq: Every day | ORAL | 9 refills | Status: DC

## 2016-09-25 MED ORDER — DEXAMETHASONE SODIUM PHOSPHATE 10 MG/ML IJ SOLN
INTRAMUSCULAR | Status: AC
  Filled 2016-09-25: qty 1

## 2016-09-25 NOTE — Patient Instructions (Signed)
[facility] Discharge Instructions for Patients Receiving Chemotherapy  Today you received the following chemotherapy agents gemzar/carboplatin  To help prevent nausea and vomiting after your treatment, we encourage you to take your nausea medication as directed   If you develop nausea and vomiting that is not controlled by your nausea medication, call the clinic.   BELOW ARE SYMPTOMS THAT SHOULD BE REPORTED IMMEDIATELY:  *FEVER GREATER THAN 100.5 F  *CHILLS WITH OR WITHOUT FEVER  NAUSEA AND VOMITING THAT IS NOT CONTROLLED WITH YOUR NAUSEA MEDICATION  *UNUSUAL SHORTNESS OF BREATH  *UNUSUAL BRUISING OR BLEEDING  TENDERNESS IN MOUTH AND THROAT WITH OR WITHOUT PRESENCE OF ULCERS  *URINARY PROBLEMS  *BOWEL PROBLEMS  UNUSUAL RASH Items with * indicate a potential emergency and should be followed up as soon as possible.  Feel free to call the clinic you have any questions or concerns. The clinic phone number is [phone].  

## 2016-09-25 NOTE — Patient Instructions (Signed)
Implanted Port Home Guide An implanted port is a type of central line that is placed under the skin. Central lines are used to provide IV access when treatment or nutrition needs to be given through a person's veins. Implanted ports are used for long-term IV access. An implanted port may be placed because:  You need IV medicine that would be irritating to the small veins in your hands or arms.  You need long-term IV medicines, such as antibiotics.  You need IV nutrition for a long period.  You need frequent blood draws for lab tests.  You need dialysis.  Implanted ports are usually placed in the chest area, but they can also be placed in the upper arm, the abdomen, or the leg. An implanted port has two main parts:  Reservoir. The reservoir is round and will appear as a small, raised area under your skin. The reservoir is the part where a needle is inserted to give medicines or draw blood.  Catheter. The catheter is a thin, flexible tube that extends from the reservoir. The catheter is placed into a large vein. Medicine that is inserted into the reservoir goes into the catheter and then into the vein.  How will I care for my incision site? Do not get the incision site wet. Bathe or shower as directed by your health care provider. How is my port accessed? Special steps must be taken to access the port:  Before the port is accessed, a numbing cream can be placed on the skin. This helps numb the skin over the port site.  Your health care provider uses a sterile technique to access the port. ? Your health care provider must put on a mask and sterile gloves. ? The skin over your port is cleaned carefully with an antiseptic and allowed to dry. ? The port is gently pinched between sterile gloves, and a needle is inserted into the port.  Only "non-coring" port needles should be used to access the port. Once the port is accessed, a blood return should be checked. This helps ensure that the port  is in the vein and is not clogged.  If your port needs to remain accessed for a constant infusion, a clear (transparent) bandage will be placed over the needle site. The bandage and needle will need to be changed every week, or as directed by your health care provider.  Keep the bandage covering the needle clean and dry. Do not get it wet. Follow your health care provider's instructions on how to take a shower or bath while the port is accessed.  If your port does not need to stay accessed, no bandage is needed over the port.  What is flushing? Flushing helps keep the port from getting clogged. Follow your health care provider's instructions on how and when to flush the port. Ports are usually flushed with saline solution or a medicine called heparin. The need for flushing will depend on how the port is used.  If the port is used for intermittent medicines or blood draws, the port will need to be flushed: ? After medicines have been given. ? After blood has been drawn. ? As part of routine maintenance.  If a constant infusion is running, the port may not need to be flushed.  How long will my port stay implanted? The port can stay in for as long as your health care provider thinks it is needed. When it is time for the port to come out, surgery will be   done to remove it. The procedure is similar to the one performed when the port was put in. When should I seek immediate medical care? When you have an implanted port, you should seek immediate medical care if:  You notice a bad smell coming from the incision site.  You have swelling, redness, or drainage at the incision site.  You have more swelling or pain at the port site or the surrounding area.  You have a fever that is not controlled with medicine.  This information is not intended to replace advice given to you by your health care provider. Make sure you discuss any questions you have with your health care provider. Document  Released: 05/22/2005 Document Revised: 10/28/2015 Document Reviewed: 01/27/2013 Elsevier Interactive Patient Education  2017 Elsevier Inc.  

## 2016-09-26 MED FILL — oxyCODONE HCL 5 MG TABS: 5 | 10 days supply | Qty: 60 | Fill #0

## 2016-09-26 MED FILL — MIRTAZAPINE 15 MG TABLET: 15 | 30 days supply | Qty: 30 | Fill #0

## 2016-09-29 ENCOUNTER — Telehealth: Payer: Self-pay | Admitting: Medical Oncology

## 2016-09-29 ENCOUNTER — Other Ambulatory Visit: Payer: Self-pay | Admitting: Medical Oncology

## 2016-09-29 DIAGNOSIS — T451X5A Adverse effect of antineoplastic and immunosuppressive drugs, initial encounter: Principal | ICD-10-CM

## 2016-09-29 DIAGNOSIS — R112 Nausea with vomiting, unspecified: Secondary | ICD-10-CM

## 2016-09-29 MED ORDER — ONDANSETRON 8 MG PO TBDP: 8.0000 mg | ORAL_TABLET | Freq: Three times a day (TID) | ORAL | 0 refills | Status: AC | PRN

## 2016-09-29 MED FILL — ONDANSETRON ODT 8 MG TABLET: 8 | 10 days supply | Qty: 30 | Fill #0

## 2016-09-29 NOTE — Telephone Encounter (Addendum)
Dtr called - Audrey Garza has been vomiting the last 3 days 2 hours after drinking fluid. She has been juicing. Carbo /gem monday . Tuesday vomited p lunch and at 10 pm, then again on  wed at 10 pm, Thursday 10 pm . Today she drank juice without vomiting , but has nausea.and does not want to drink anything-  Denies Diarrhea,abd pain ,weakness ,dizziness . LBM last night "normal . Phenergan does not help. Per Dr Alen Blew Zofran ODT refilled. Dtr notified . I also instructed dtr if symptoms worsen to call on call .

## 2016-09-29 NOTE — Telephone Encounter (Signed)
err

## 2016-10-02 NOTE — Telephone Encounter (Signed)
Tammi, can you call and ask how she is doing? Does she need IVF today? Her appt is tomorrow

## 2016-10-03 ENCOUNTER — Ambulatory Visit (HOSPITAL_BASED_OUTPATIENT_CLINIC_OR_DEPARTMENT_OTHER): Payer: Self-pay

## 2016-10-03 ENCOUNTER — Ambulatory Visit (HOSPITAL_BASED_OUTPATIENT_CLINIC_OR_DEPARTMENT_OTHER): Payer: Self-pay | Admitting: Hematology and Oncology

## 2016-10-03 ENCOUNTER — Telehealth: Payer: Self-pay | Admitting: *Deleted

## 2016-10-03 ENCOUNTER — Telehealth: Payer: Self-pay | Admitting: Hematology and Oncology

## 2016-10-03 ENCOUNTER — Ambulatory Visit: Payer: Self-pay

## 2016-10-03 ENCOUNTER — Other Ambulatory Visit (HOSPITAL_BASED_OUTPATIENT_CLINIC_OR_DEPARTMENT_OTHER): Payer: Self-pay

## 2016-10-03 DIAGNOSIS — Z95828 Presence of other vascular implants and grafts: Secondary | ICD-10-CM

## 2016-10-03 DIAGNOSIS — D6481 Anemia due to antineoplastic chemotherapy: Secondary | ICD-10-CM

## 2016-10-03 DIAGNOSIS — C786 Secondary malignant neoplasm of retroperitoneum and peritoneum: Secondary | ICD-10-CM

## 2016-10-03 DIAGNOSIS — C801 Malignant (primary) neoplasm, unspecified: Secondary | ICD-10-CM

## 2016-10-03 DIAGNOSIS — C569 Malignant neoplasm of unspecified ovary: Secondary | ICD-10-CM

## 2016-10-03 DIAGNOSIS — R110 Nausea: Secondary | ICD-10-CM

## 2016-10-03 DIAGNOSIS — Z5111 Encounter for antineoplastic chemotherapy: Secondary | ICD-10-CM

## 2016-10-03 DIAGNOSIS — T451X5A Adverse effect of antineoplastic and immunosuppressive drugs, initial encounter: Secondary | ICD-10-CM

## 2016-10-03 DIAGNOSIS — R634 Abnormal weight loss: Secondary | ICD-10-CM

## 2016-10-03 LAB — COMPREHENSIVE METABOLIC PANEL
ALK PHOS: 94 U/L (ref 40–150)
ALT: 19 U/L (ref 0–55)
AST: 18 U/L (ref 5–34)
Albumin: 3.7 g/dL (ref 3.5–5.0)
Anion Gap: 10 mEq/L (ref 3–11)
BUN: 12 mg/dL (ref 7.0–26.0)
CALCIUM: 10 mg/dL (ref 8.4–10.4)
CO2: 28 mEq/L (ref 22–29)
CREATININE: 0.8 mg/dL (ref 0.6–1.1)
Chloride: 101 mEq/L (ref 98–109)
EGFR: 74 mL/min/{1.73_m2} — ABNORMAL LOW (ref >90)
GLUCOSE: 113 mg/dL (ref 70–140)
Potassium: 3.9 mEq/L (ref 3.5–5.1)
SODIUM: 139 meq/L (ref 136–145)
TOTAL PROTEIN: 7.4 g/dL (ref 6.4–8.3)
Total Bilirubin: 0.31 mg/dL (ref 0.20–1.20)

## 2016-10-03 LAB — CBC WITH DIFFERENTIAL/PLATELET
BASO%: 0.6 % (ref 0.0–2.0)
BASOS ABS: 0 10*3/uL (ref 0.0–0.1)
EOS%: 0.2 % (ref 0.0–7.0)
Eosinophils Absolute: 0 10*3/uL (ref 0.0–0.5)
HEMATOCRIT: 33.2 % — AB (ref 34.8–46.6)
HEMOGLOBIN: 10.9 g/dL — AB (ref 11.6–15.9)
LYMPH#: 1.7 10*3/uL (ref 0.9–3.3)
LYMPH%: 29.5 % (ref 14.0–49.7)
MCH: 27.1 pg (ref 25.1–34.0)
MCHC: 32.9 g/dL (ref 31.5–36.0)
MCV: 82.4 fL (ref 79.5–101.0)
MONO#: 0.5 10*3/uL (ref 0.1–0.9)
MONO%: 8.4 % (ref 0.0–14.0)
NEUT%: 61.3 % (ref 38.4–76.8)
NEUTROS ABS: 3.5 10*3/uL (ref 1.5–6.5)
Platelets: 304 10*3/uL (ref 145–400)
RBC: 4.03 10*6/uL (ref 3.70–5.45)
RDW: 18.4 % — AB (ref 11.2–14.5)
WBC: 5.8 10*3/uL (ref 3.9–10.3)

## 2016-10-03 MED ORDER — SODIUM CHLORIDE 0.9 % IV SOLN
Freq: Once | INTRAVENOUS | Status: AC
Start: 2016-10-03 — End: 2016-10-03
  Administered 2016-10-03: 13:00:00 via INTRAVENOUS

## 2016-10-03 MED ORDER — SODIUM CHLORIDE 0.9% FLUSH
10.0000 mL | INTRAVENOUS | Status: DC | PRN
  Administered 2016-10-03: 10 mL
  Filled 2016-10-03: qty 10

## 2016-10-03 MED ORDER — ONDANSETRON HCL 4 MG/2ML IJ SOLN
INTRAMUSCULAR | Status: AC
  Filled 2016-10-03: qty 4

## 2016-10-03 MED ORDER — SODIUM CHLORIDE 0.9 % IV SOLN: Freq: Once | INTRAVENOUS | Status: DC

## 2016-10-03 MED ORDER — SODIUM CHLORIDE 0.9 % IV SOLN
800.0000 mg/m2 | Freq: Once | INTRAVENOUS | Status: AC
  Administered 2016-10-03: 1444 mg via INTRAVENOUS
  Filled 2016-10-03: qty 37.98

## 2016-10-03 MED ORDER — PROMETHAZINE HCL 25 MG PO TABS: 25.0000 mg | ORAL_TABLET | Freq: Four times a day (QID) | ORAL | 9 refills | Status: AC | PRN

## 2016-10-03 MED ORDER — SODIUM CHLORIDE 0.9% FLUSH
10.0000 mL | Freq: Once | INTRAVENOUS | Status: AC
  Administered 2016-10-03: 10 mL
  Filled 2016-10-03: qty 10

## 2016-10-03 MED ORDER — ONDANSETRON HCL 4 MG/2ML IJ SOLN
8.0000 mg | Freq: Once | INTRAMUSCULAR | Status: AC
  Administered 2016-10-03: 8 mg via INTRAVENOUS

## 2016-10-03 MED ORDER — HEPARIN SOD (PORK) LOCK FLUSH 100 UNIT/ML IV SOLN
500.0000 [IU] | Freq: Once | INTRAVENOUS | Status: AC | PRN
  Administered 2016-10-03: 500 [IU]
  Filled 2016-10-03: qty 5

## 2016-10-03 NOTE — Patient Instructions (Signed)
[facility] Discharge Instructions for Patients Receiving Chemotherapy  Today you received the following chemotherapy agents gemcitabine (Gemzar).   To help prevent nausea and vomiting after your treatment, we encourage you to take your nausea medication as directed  If you develop nausea and vomiting that is not controlled by your nausea medication, call the clinic.   BELOW ARE SYMPTOMS THAT SHOULD BE REPORTED IMMEDIATELY:  *FEVER GREATER THAN 100.5 F  *CHILLS WITH OR WITHOUT FEVER  NAUSEA AND VOMITING THAT IS NOT CONTROLLED WITH YOUR NAUSEA MEDICATION  *UNUSUAL SHORTNESS OF BREATH  *UNUSUAL BRUISING OR BLEEDING  TENDERNESS IN MOUTH AND THROAT WITH OR WITHOUT PRESENCE OF ULCERS  *URINARY PROBLEMS  *BOWEL PROBLEMS  UNUSUAL RASH Items with * indicate a potential emergency and should be followed up as soon as possible.  Feel free to call the clinic you have any questions or concerns. The clinic phone number is [phone].

## 2016-10-03 NOTE — Telephone Encounter (Signed)
No call

## 2016-10-03 NOTE — Telephone Encounter (Signed)
Gave patient AVS and calender per 5/1 lod. Per Dr. Alvy Bimler - okay to see patient on 5/15 in the afternoon .

## 2016-10-04 LAB — CA 125: Cancer Antigen (CA) 125: 22.9 U/mL (ref 0.0–38.1)

## 2016-10-05 ENCOUNTER — Encounter: Payer: Self-pay | Admitting: Hematology and Oncology

## 2016-10-05 DIAGNOSIS — T451X5A Adverse effect of antineoplastic and immunosuppressive drugs, initial encounter: Secondary | ICD-10-CM

## 2016-10-05 DIAGNOSIS — R110 Nausea: Secondary | ICD-10-CM | POA: Insufficient documentation

## 2016-10-05 DIAGNOSIS — R634 Abnormal weight loss: Secondary | ICD-10-CM | POA: Insufficient documentation

## 2016-10-05 NOTE — Assessment & Plan Note (Signed)
She tolerated treatment well except for fatigue  Her recent symptoms of severe nausea and vomiting could be due to treatment.  This will be discussed separately We will proceed with treatment without dose adjustment I will see her back in 2 weeks prior to next treatment

## 2016-10-05 NOTE — Progress Notes (Signed)
[facility] OFFICE PROGRESS NOTE  Patient Care Team: No Pcp Per Patient as PCP - General (General Practice)  SUMMARY OF ONCOLOGIC HISTORY:   Ovarian carcinoma ([place])   04/19/2015 Initial Diagnosis    In late 03-2015 she in [place] and was found to have large pelvic mass and perihepatic ascites. CA 125 then was 359, with CEA of 3, CA 19-9 5.8 and alpha fetoprotein 2.3. She had exploratory laparotomy 04-19-15, not done by gyn oncologist, with bilateral ovaried an tumors, 10 cm with capsule ruptured on right , smaller on left, peritoneal carcinomatosis.       05/20/2015 Miscellaneous    She had some bowel obstruction post operatively, with NG used . Pathology reportedcystic and solid left ovarian mass, 13 cm right ovarian mass, moderately differentiated tumor "tumor de celulas de la granulosa". 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 CT AP 05-2015 had ascites, carcinomatosis and multiple peritoneal implants. CA 125 on 05-24-15 baseline for chemo was 141. She received 3 cycles of taxol + carboplatin from 05-26-15 thru 07-07-15 (not clear if avastin given those cycles), after which carboplatin was not available in [place]. She received taxol 175 mg/m2, CDDP and avastin for 3 additional cycles thru 10-07-15. Second look laparotomy was discussed, patient declined, and instead had 4 cycles avastin + taxol from 12-01-15 thru 02-02-16 (apparentlyno CDDP after 10-07-15 and no carboplatin after 07-07-15). CT CAP 02-24-16 reportedly had no evidence of malignancy, with some bullous emphysema, tortuous sigmoid colon with diverticulosis. . Recommendation was for maintenance avastin x 1 year, however patient then came to [place] to be with family.       05/20/2016 Imaging    Extensive peritoneal disease with omental thickening/ 18 and large volume ascites. 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  Trace bilateral pleural effusions. Moderate hiatal hernia. Bilateral inguinal hernias containing fat. Left colonic diverticulosis. Aortoiliac atherosclerosis.      05/20/2016 - 06/13/2016 Hospital Admission    76 year old female with a history of metastatic ovarian cancer diagnosed in January 2017 (s/p primary debulking surgery followed by 8 adjuvant cycles of carboplatin and paclitaxel and 4 additional cycles of carboplatin, paclitaxel, avastin), last chemo Sept 2017, presented with progressive abdominal bloating and pain for 3 weeks duration. She was feeling well until the first week of December, 2017 when she was sick with a virus from which she never recovered. She states that she began feeling distended in the abdomen and uncomfortable and was taken to the ED on 05/20/16. CT chest/abd/pelvis9-21-17 reportedly with no evidence of malignancy, with some bullous emphysema, tortuous sigmoid colon with diverticulosis. Recommendation was for maintenance avastin x 1 year, however patient then came to [facility] in 02-2016. She has not established care with any physician since coming to [place]. Unfortunately, after paracentesis she began having increasing abdominal pain. Abdominal x-ray revealed distended small bowel loops concerning for small bowel obstruction. Gen. surgery was consulted. NG was inserted to suction. Because of prolonged Hospitalization she was placed on TPN/TNA. She underwent Chemotheray with Doxil on 12/26. Repeat U/S 12/31 showed interval progression of ascites and She is s/p repeat Paracentesis 06-06-16 which removed 4 liters. She was subsequently discharged      05/21/2016 Tumor Marker    Patient's tumor was tested for the following markers: CA125 Results of the tumor marker test revealed 249.8      05/22/2016 Procedure    Successful ultrasound-guided paracentesis yielding 2.9 liters of peritoneal fluid.  05/22/2016 Pathology Results    PERITONEAL/ASCITIC FLUID, 1 OF 1 COLLECTED ON  05/22/16: ADENOCARCINOMA. ADDENDUM: Immunohistochemistry is performed on the cell block and the tumor is strongly positive with cytokeratin 7 and cytokeratin 8/18 and shows patchy positivity with estrogen receptor, cytokeratin 5/6, WT-1 and MOC-31 with focal weak positivity with PAX-8. The tumor is negative with thyroglobulin, thyroid transcription factor-1, Napsin-A, progesterone receptor, GATA 3, gross cystic disease fluid protein, cytokeratin 20, CDX-2 and Calretinin. 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       05/24/2016 Procedure    Ultrasound and fluoroscopically guided right internal jugular single lumen power port catheter insertion. Tip in the SVC/RA junction. Catheter ready for use      05/24/2016 Imaging    LV EF: 65% -   70%      05/28/2016 Imaging    Small volume of abdominal and pelvic ascites is identified and inadequate for paracentesis.      05/30/2016 - 08/21/2016 Chemotherapy    She received doxorubicin x 3 cycles, stopped due to disease progression      06/04/2016 Imaging    Increased amount of ascites, freely distributed in all 4 quadrants. No other acute finding by ultrasound.      06/06/2016 Imaging    Small left greater than right pleural effusions, increased compared to prior CT. Interim development of partial consolidations within the bilateral lower lobes which may reflect atelectasis or pneumonia. Few scattered foci of ground-glass density within the right upper and left upper lobes, suspect small foci of inflammation or infection. 2. Moderate to large volume of ascites in the upper abdomen with extensive mesenteric and omental metastatic disease partially visualized. 3. Dilated gallbladder.      06/08/2016 Procedure    Successful ultrasound and fluoroscopic guided placement of a tunneled peritoneal drainage catheter.  2. Successful aspiration of 900 cc of serous ascites following tunneled peritoneal drainage catheter placement.      06/19/2016 Tumor Marker    Patient's tumor was tested for the following markers: CA125 Results of the tumor marker test revealed 166.6      07/24/2016 Tumor Marker    Patient's tumor was tested for the following markers: CA125 Results of the tumor marker test revealed 104.4      08/18/2016 Imaging    Extensive peritoneal metastatic disease within the abdomen. This is somewhat difficult to compare with prior given diffuse nature however there is suggestion of interval progression. Interval resolution of previously described bilateral pleural effusions. Peritoneal drainage catheter is present with tip terminating in the right upper quadrant. Interval decrease in now small volume ascites. Aortic atherosclerosis. Diverticulosis without CT evidence for acute diverticulitis.      08/18/2016 Imaging    LV EF: 60% -  65%      08/21/2016 Tumor Marker    Patient's tumor was tested for the following markers: CA125 Results of the tumor marker test revealed 96.1      08/29/2016 -  Chemotherapy    She received gemzar and carboplatin      09/05/2016 Tumor Marker    Patient's tumor was tested for the following markers: CA125 Results of the tumor marker test revealed 48.1      09/20/2016 Procedure    Status post bedside removal of tunneled right-sided peritoneal drain which was removed in its entirety.      10/03/2016 Tumor Marker    Patient's tumor  was tested for the following markers: CA125 Results of the tumor marker test revealed 22.9       INTERVAL HISTORY: Please see below for problem oriented charting. She is seen with her daughter Her daughter serves as an interpreter Since peritoneal drain is removed, her abdominal pain had resolved However, she developed significant nausea and vomiting after chemotherapy It took 5 days before it resolved She has lost a lot of weight  because of this She denies recent constipation The patient denies any recent signs or symptoms of bleeding such as spontaneous epistaxis, hematuria or hematochezia.  REVIEW OF SYSTEMS:   Constitutional: Denies fevers, chills  Eyes: Denies blurriness of vision Ears, nose, mouth, throat, and face: Denies mucositis or sore throat Respiratory: Denies cough, dyspnea or wheezes Cardiovascular: Denies palpitation, chest discomfort or lower extremity swelling Skin: Denies abnormal skin rashes Lymphatics: Denies new lymphadenopathy or easy bruising Neurological:Denies numbness, tingling or new weaknesses Behavioral/Psych: Mood is stable, no new changes  All other systems were reviewed with the patient and are negative.  I have reviewed the past medical history, past surgical history, social history and family history with the patient and they are unchanged from previous note.  ALLERGIES:  has No Known Allergies.  MEDICATIONS:  Current Outpatient Prescriptions  Medication Sig Dispense Refill  . acetaminophen (TYLENOL) 325 MG tablet Take 650 mg by mouth every 5 (five) hours as needed for moderate pain.    Marland Kitchen lactulose (CHRONULAC) 10 GM/15ML solution 10 ml three times a day by mouth 240 mL 0  . lidocaine-prilocaine (EMLA) cream Apply to port 1 hour before access with needle. Cover with plastic wrap. 30 g 11  . magnesium 30 MG tablet Take 30 mg by mouth daily.     . mirtazapine (REMERON) 15 MG tablet Take 1 tablet (15 mg total) by mouth at bedtime. 30 tablet 9  . ondansetron (ZOFRAN ODT) 8 MG disintegrating tablet Take 1 tablet (8 mg total) by mouth every 8 (eight) hours as needed for nausea or vomiting. 30 tablet 0  . oxyCODONE (OXY IR/ROXICODONE) 5 MG immediate release tablet Take 1 tablet (5 mg total) by mouth every 4 (four) hours as needed for severe pain. 60 tablet 0  . oxyCODONE-acetaminophen (PERCOCET/ROXICET) 5-325 MG tablet Take 1 tablet by mouth every 4 (four) hours as needed for moderate  pain or severe pain. 10 tablet 0  . pantoprazole (PROTONIX) 40 MG tablet Take 1 tablet (40 mg total) by mouth daily. 30 tablet 1  . polyethylene glycol (MIRALAX) packet Take 17 g by mouth daily. 14 each 9  . Potassium 75 MG TABS Take 75 mg by mouth daily.    . promethazine (PHENERGAN) 25 MG tablet Take 1 tablet (25 mg total) by mouth every 6 (six) hours as needed for nausea or vomiting. 30 tablet 9  . protein supplement (UNJURY CHICKEN SOUP) POWD Take 27 g (8 oz total) by mouth 2 (two) times daily with a meal. 27 g 0  . senna-docusate (SENOKOT-S) 8.6-50 MG tablet Take 2 tablets by mouth 2 (two) times daily. 60 tablet 9  . simethicone (GAS-X) 80 MG chewable tablet Chew 160 mg by mouth every 6 (six) hours as needed for flatulence.    Marland Kitchen alum & mag hydroxide-simeth (MAALOX/MYLANTA) 200-200-20 MG/5ML suspension Take 60 mLs by mouth every 4 (four) hours as needed for indigestion. (Patient not taking: Reported on 06/19/2016) 355 mL 0  . Glycerin, Adult, 2.1 g SUPP Place 1 suppository rectally as needed for moderate constipation. (  Patient not taking: Reported on 06/19/2016) 10 suppository 0   No current facility-administered medications for this visit.     PHYSICAL EXAMINATION: ECOG PERFORMANCE STATUS: 1 - Symptomatic but completely ambulatory  Vitals:   10/03/16 1208  BP: 105/63  Pulse: 84  Resp: 18  Temp: 98 F (36.7 C)   Filed Weights   10/03/16 1208  Weight: 150 lb 14.4 oz (68.4 kg)    GENERAL:alert, no distress and comfortable SKIN: skin color, texture, turgor are normal, no rashes or significant lesions EYES: normal, Conjunctiva are pink and non-injected, sclera clear OROPHARYNX:no exudate, no erythema and lips, buccal mucosa, and tongue normal  NECK: supple, thyroid normal size, non-tender, without nodularity LYMPH:  no palpable lymphadenopathy in the cervical, axillary or inguinal LUNGS: clear to auscultation and percussion with normal breathing effort HEART: regular rate & rhythm  and no murmurs and no lower extremity edema ABDOMEN:abdomen soft, non-tender and normal bowel sounds Musculoskeletal:no cyanosis of digits and no clubbing  NEURO: alert & oriented x 3 with fluent speech, no focal motor/sensory deficits  LABORATORY DATA:  I have reviewed the data as listed    Component Value Date/Time   NA 139 10/03/2016 1142   K 3.9 10/03/2016 1142   CL 100 (L) 09/20/2016 1252   CO2 28 10/03/2016 1142   GLUCOSE 113 10/03/2016 1142   BUN 12.0 10/03/2016 1142   CREATININE 0.8 10/03/2016 1142   CALCIUM 10.0 10/03/2016 1142   PROT 7.4 10/03/2016 1142   ALBUMIN 3.7 10/03/2016 1142   AST 18 10/03/2016 1142   ALT 19 10/03/2016 1142   ALKPHOS 94 10/03/2016 1142   BILITOT 0.31 10/03/2016 1142   GFRNONAA >60 09/20/2016 1252   GFRAA >60 09/20/2016 1252    No results found for: SPEP, UPEP  Lab Results  Component Value Date   WBC 5.8 10/03/2016   NEUTROABS 3.5 10/03/2016   HGB 10.9 (L) 10/03/2016   HCT 33.2 (L) 10/03/2016   MCV 82.4 10/03/2016   PLT 304 10/03/2016      Chemistry      Component Value Date/Time   NA 139 10/03/2016 1142   K 3.9 10/03/2016 1142   CL 100 (L) 09/20/2016 1252   CO2 28 10/03/2016 1142   BUN 12.0 10/03/2016 1142   CREATININE 0.8 10/03/2016 1142      Component Value Date/Time   CALCIUM 10.0 10/03/2016 1142   ALKPHOS 94 10/03/2016 1142   AST 18 10/03/2016 1142   ALT 19 10/03/2016 1142   BILITOT 0.31 10/03/2016 1142       RADIOGRAPHIC STUDIES: I have personally reviewed the radiological images as listed and agreed with the findings in the report. Ir Removal Perm Peritoneal Cath  Result Date: 09/20/2016 INDICATION: 76 year old female with a history of malignant ascites. Prior catheter placed June 08, 2016. Catheter has stopped draining during her treatment. EXAM: PERITONEAL CATH REMOVAL (PERM) MEDICATIONS: 2.0 g Ancef The antibiotics were administered within an appropriate time frame prior to the initiation of the procedure.  ANESTHESIA/SEDATION: Fentanyl 2.0 mcg IV; Versed 100 mg IV Moderate Sedation Time:  10 minutes The patient was continuously monitored during the procedure by the interventional radiology nurse under my direct supervision. COMPLICATIONS: None PROCEDURE: Informed written consent was obtained from the patient and the patient's family with an interpreter after a thorough discussion of the procedural risks, benefits and alternatives. All questions were addressed. Maximal Sterile Barrier Technique was utilized including caps, mask, sterile gowns, sterile gloves, sterile drape, hand hygiene and skin antiseptic.  A timeout was performed prior to the initiation of the procedure. Patient position on the table in a supine position. The abdomen in the indwelling drain were prepped and draped in the usual sterile fashion. The skin and subcutaneous tissues were generously infiltrated 1% lidocaine for local anesthesia. Using sharp/blunt dissection, the catheter was freed from the tract and removed in its entirety. Sterile dressing was placed. Patient tolerated the procedure well and remained hemodynamically stable throughout. No complications were encountered and no significant blood loss. IMPRESSION: Status post bedside removal of tunneled right-sided peritoneal drain which was removed in its entirety. Signed, Dulcy Fanny. Earleen Newport, DO [facility] Electronically Signed   By: Corrie Mckusick D.O.   On: 09/20/2016 17:21    ASSESSMENT & PLAN:  Ovarian carcinoma ([place]) She tolerated treatment well except for fatigue  Her recent symptoms of severe nausea and vomiting could be due to treatment.  This will be discussed separately We will proceed with treatment without dose adjustment I will see her back in 2 weeks prior to next treatment  Anemia due to antineoplastic chemotherapy This is likely due to recent treatment. The patient denies recent history of bleeding such as  epistaxis, hematuria or hematochezia. She is asymptomatic from the anemia. I will observe for now.  She does not require transfusion now. I will continue the chemotherapy at current dose without dosage adjustment.  If the anemia gets progressive worse in the future, I might have to delay her treatment or adjust the chemotherapy dose.   Chemotherapy-induced nausea She had recent severe nausea and vomiting. We review anti-emetics regimen. I will prescribe additional antiemetics I will adjust her premedication prior to next chemotherapy  Weight loss She has recent significant weight loss due to recent severe nausea vomiting We discussed increasing oral intake as tolerated I recommend she continues on Remeron for now to stimulate appetite    No orders of the defined types were placed in this encounter.  All questions were answered. The patient knows to call the clinic with any problems, questions or concerns. No barriers to learning was detected. I spent 20 minutes counseling the patient face to face. The total time spent in the appointment was 25 minutes and more than 50% was on counseling and review of test results     Heath Lark, MD 10/05/2016 5:35 AM

## 2016-10-05 NOTE — Assessment & Plan Note (Signed)

## 2016-10-05 NOTE — Assessment & Plan Note (Signed)
She has recent significant weight loss due to recent severe nausea vomiting We discussed increasing oral intake as tolerated I recommend she continues on Remeron for now to stimulate appetite

## 2016-10-05 NOTE — Assessment & Plan Note (Signed)
She had recent severe nausea and vomiting. We review anti-emetics regimen. I will prescribe additional antiemetics I will adjust her premedication prior to next chemotherapy

## 2016-10-10 ENCOUNTER — Other Ambulatory Visit: Payer: Self-pay

## 2016-10-10 MED ORDER — LACTULOSE 10 GM/15ML PO SOLN: ORAL | 0 refills | Status: DC

## 2016-10-10 NOTE — Telephone Encounter (Signed)
Daughter notified that refill sent.

## 2016-10-10 NOTE — Telephone Encounter (Signed)
Daughter left message, requesting Lactulose refill.

## 2016-10-10 NOTE — Telephone Encounter (Signed)
-

## 2016-10-17 ENCOUNTER — Ambulatory Visit: Payer: Self-pay

## 2016-10-17 ENCOUNTER — Other Ambulatory Visit: Payer: Self-pay

## 2016-10-17 ENCOUNTER — Telehealth: Payer: Self-pay | Admitting: Hematology and Oncology

## 2016-10-17 ENCOUNTER — Encounter: Payer: Self-pay | Admitting: Hematology and Oncology

## 2016-10-17 ENCOUNTER — Ambulatory Visit (HOSPITAL_BASED_OUTPATIENT_CLINIC_OR_DEPARTMENT_OTHER): Payer: Self-pay | Admitting: Hematology and Oncology

## 2016-10-17 ENCOUNTER — Ambulatory Visit (HOSPITAL_BASED_OUTPATIENT_CLINIC_OR_DEPARTMENT_OTHER): Payer: Self-pay

## 2016-10-17 VITALS — BP 126/63 | HR 80 | Temp 98.9°F | Resp 18 | Ht 63.0 in | Wt 153.4 lb

## 2016-10-17 DIAGNOSIS — R180 Malignant ascites: Secondary | ICD-10-CM

## 2016-10-17 DIAGNOSIS — Z5111 Encounter for antineoplastic chemotherapy: Secondary | ICD-10-CM

## 2016-10-17 DIAGNOSIS — Z95828 Presence of other vascular implants and grafts: Secondary | ICD-10-CM

## 2016-10-17 DIAGNOSIS — C569 Malignant neoplasm of unspecified ovary: Secondary | ICD-10-CM

## 2016-10-17 DIAGNOSIS — C801 Malignant (primary) neoplasm, unspecified: Secondary | ICD-10-CM

## 2016-10-17 DIAGNOSIS — C786 Secondary malignant neoplasm of retroperitoneum and peritoneum: Secondary | ICD-10-CM

## 2016-10-17 DIAGNOSIS — D61818 Other pancytopenia: Secondary | ICD-10-CM

## 2016-10-17 LAB — CBC WITH DIFFERENTIAL/PLATELET
BASO%: 0.7 % (ref 0.0–2.0)
BASOS ABS: 0 10*3/uL (ref 0.0–0.1)
EOS ABS: 0 10*3/uL (ref 0.0–0.5)
EOS%: 0.5 % (ref 0.0–7.0)
HCT: 32.8 % — ABNORMAL LOW (ref 34.8–46.6)
HGB: 10.9 g/dL — ABNORMAL LOW (ref 11.6–15.9)
LYMPH%: 37.4 % (ref 14.0–49.7)
MCH: 28.6 pg (ref 25.1–34.0)
MCHC: 33.3 g/dL (ref 31.5–36.0)
MCV: 86 fL (ref 79.5–101.0)
MONO#: 0.6 10*3/uL (ref 0.1–0.9)
MONO%: 16.9 % — AB (ref 0.0–14.0)
NEUT#: 1.7 10*3/uL (ref 1.5–6.5)
NEUT%: 44.5 % (ref 38.4–76.8)
Platelets: 477 10*3/uL — ABNORMAL HIGH (ref 145–400)
RBC: 3.81 10*6/uL (ref 3.70–5.45)
RDW: 21.7 % — ABNORMAL HIGH (ref 11.2–14.5)
WBC: 3.8 10*3/uL — ABNORMAL LOW (ref 3.9–10.3)
lymph#: 1.4 10*3/uL (ref 0.9–3.3)

## 2016-10-17 LAB — COMPREHENSIVE METABOLIC PANEL
ALBUMIN: 3.7 g/dL (ref 3.5–5.0)
ALK PHOS: 82 U/L (ref 40–150)
ALT: 15 U/L (ref 0–55)
ANION GAP: 10 meq/L (ref 3–11)
AST: 15 U/L (ref 5–34)
BUN: 7.7 mg/dL (ref 7.0–26.0)
CO2: 25 meq/L (ref 22–29)
Calcium: 9.8 mg/dL (ref 8.4–10.4)
Chloride: 104 mEq/L (ref 98–109)
Creatinine: 0.7 mg/dL (ref 0.6–1.1)
EGFR: 84 mL/min/{1.73_m2} — AB (ref >90)
GLUCOSE: 102 mg/dL (ref 70–140)
POTASSIUM: 4.5 meq/L (ref 3.5–5.1)
SODIUM: 138 meq/L (ref 136–145)
Total Bilirubin: 0.35 mg/dL (ref 0.20–1.20)
Total Protein: 7.3 g/dL (ref 6.4–8.3)

## 2016-10-17 MED ORDER — SODIUM CHLORIDE 0.9 % IV SOLN
403.5000 mg | Freq: Once | INTRAVENOUS | Status: AC
  Administered 2016-10-17: 400 mg via INTRAVENOUS
  Filled 2016-10-17: qty 40

## 2016-10-17 MED ORDER — SODIUM CHLORIDE 0.9 % IV SOLN
800.0000 mg/m2 | Freq: Once | INTRAVENOUS | Status: AC
  Administered 2016-10-17: 1444 mg via INTRAVENOUS
  Filled 2016-10-17: qty 37.98

## 2016-10-17 MED ORDER — PALONOSETRON HCL INJECTION 0.25 MG/5ML
0.2500 mg | Freq: Once | INTRAVENOUS | Status: AC
  Administered 2016-10-17: 0.25 mg via INTRAVENOUS

## 2016-10-17 MED ORDER — SODIUM CHLORIDE 0.9% FLUSH
10.0000 mL | INTRAVENOUS | Status: DC | PRN
  Administered 2016-10-17: 10 mL
  Filled 2016-10-17: qty 10

## 2016-10-17 MED ORDER — SODIUM CHLORIDE 0.9 % IV SOLN
Freq: Once | INTRAVENOUS | Status: AC
  Administered 2016-10-17: 15:00:00 via INTRAVENOUS

## 2016-10-17 MED ORDER — SODIUM CHLORIDE 0.9 % IV SOLN
Freq: Once | INTRAVENOUS | Status: AC
  Administered 2016-10-17: 16:00:00 via INTRAVENOUS
  Filled 2016-10-17: qty 5

## 2016-10-17 MED ORDER — HEPARIN SOD (PORK) LOCK FLUSH 100 UNIT/ML IV SOLN
500.0000 [IU] | Freq: Once | INTRAVENOUS | Status: AC | PRN
  Administered 2016-10-17: 500 [IU]
  Filled 2016-10-17: qty 5

## 2016-10-17 MED ORDER — CARBOPLATIN CHEMO INTRADERMAL TEST DOSE 100MCG/0.02ML
100.0000 ug | Freq: Once | INTRADERMAL | Status: AC
  Administered 2016-10-17: 100 ug via INTRADERMAL
  Filled 2016-10-17: qty 0.02

## 2016-10-17 MED ORDER — PALONOSETRON HCL INJECTION 0.25 MG/5ML
INTRAVENOUS | Status: AC
  Filled 2016-10-17: qty 5

## 2016-10-17 MED ORDER — SODIUM CHLORIDE 0.9% FLUSH
10.0000 mL | Freq: Once | INTRAVENOUS | Status: AC
  Administered 2016-10-17: 10 mL
  Filled 2016-10-17: qty 10

## 2016-10-17 NOTE — Assessment & Plan Note (Signed)
She has no signs or symptoms of recurrence of ascites We will continue monitor carefully

## 2016-10-17 NOTE — Assessment & Plan Note (Signed)
She tolerated treatment well except for fatigue and nausea We will proceed with treatment without dose adjustment I have adjusted her antiemetics I will see her back in 2 weeks prior to next treatment My plan would be to repeat CT imaging after 3 cycles of treatment If CT scan show positive response to treatment, I would add a Avastin to the treatment plan with plan for future maintenance Avastin

## 2016-10-17 NOTE — Telephone Encounter (Signed)
Appointments scheduled per 10/17/16 los. Patient was given a copy of the AVS report and appointment schedule per 10/07/16 los.

## 2016-10-17 NOTE — Patient Instructions (Signed)
[facility] Discharge Instructions for Patients Receiving Chemotherapy  Today you received the following chemotherapy agents:  Gemzar and Carboplatin.  To help prevent nausea and vomiting after your treatment, we encourage you to take your nausea medication as prescribed.   If you develop nausea and vomiting that is not controlled by your nausea medication, call the clinic.   BELOW ARE SYMPTOMS THAT SHOULD BE REPORTED IMMEDIATELY:  *FEVER GREATER THAN 100.5 F  *CHILLS WITH OR WITHOUT FEVER  NAUSEA AND VOMITING THAT IS NOT CONTROLLED WITH YOUR NAUSEA MEDICATION  *UNUSUAL SHORTNESS OF BREATH  *UNUSUAL BRUISING OR BLEEDING  TENDERNESS IN MOUTH AND THROAT WITH OR WITHOUT PRESENCE OF ULCERS  *URINARY PROBLEMS  *BOWEL PROBLEMS  UNUSUAL RASH Items with * indicate a potential emergency and should be followed up as soon as possible.  Feel free to call the clinic you have any questions or concerns. The clinic phone number is [phone].  Please show the CHEMO ALERT CARD at check-in to the Emergency Department and triage nurse.   

## 2016-10-17 NOTE — Progress Notes (Signed)
[place] OFFICE PROGRESS NOTE  Patient Care Team: Patient, No Pcp Per as PCP - General (General Practice)  SUMMARY OF ONCOLOGIC HISTORY:   Ovarian carcinoma ([place])   04/19/2015 Initial Diagnosis    In late 03-2015 she in [place] and was found to have large pelvic mass and perihepatic ascites. CA 125 then was 359, with CEA of 3, CA 19-9 5.8 and alpha fetoprotein 2.3. She had exploratory laparotomy 04-19-15, not done by gyn oncologist, with bilateral ovaried an tumors, 10 cm with capsule ruptured on right , smaller on left, peritoneal carcinomatosis.       05/20/2015 Miscellaneous    She had some bowel obstruction post operatively, with NG used . Pathology reportedcystic and solid left ovarian mass, 13 cm right ovarian mass, moderately differentiated tumor "tumor de celulas de la granulosa". 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 CT AP 05-2015 had ascites, carcinomatosis and multiple peritoneal implants. CA 125 on 05-24-15 baseline for chemo was 141. She received 3 cycles of taxol + carboplatin from 05-26-15 thru 07-07-15 (not clear if avastin given those cycles), after which carboplatin was not available in [place]. She received taxol 175 mg/m2, CDDP and avastin for 3 additional cycles thru 10-07-15. Second look laparotomy was discussed, patient declined, and instead had 4 cycles avastin + taxol from 12-01-15 thru 02-02-16 (apparentlyno CDDP after 10-07-15 and no carboplatin after 07-07-15). CT CAP 02-24-16 reportedly had no evidence of malignancy, with some bullous emphysema, tortuous sigmoid colon with diverticulosis. . Recommendation was for maintenance avastin x 1 year, however patient then came to  to be with family.       05/20/2016 Imaging    Extensive peritoneal disease with omental thickening/ 18 and large volume ascites. 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  Trace bilateral pleural effusions. Moderate hiatal hernia. Bilateral inguinal hernias containing fat. Left colonic diverticulosis. Aortoiliac atherosclerosis.      05/20/2016 - 06/13/2016 Hospital Admission    76 year old female with a history of metastatic ovarian cancer diagnosed in January 2017 (s/p primary debulking surgery followed by 8 adjuvant cycles of carboplatin and paclitaxel and 4 additional cycles of carboplatin, paclitaxel, avastin), last chemo Sept 2017, presented with progressive abdominal bloating and pain for 3 weeks duration. She was feeling well until the first week of December, 2017 when she was sick with a virus from which she never recovered. She states that she began feeling distended in the abdomen and uncomfortable and was taken to the ED on 05/20/16. CT chest/abd/pelvis9-21-17 reportedly with no evidence of malignancy, with some bullous emphysema, tortuous sigmoid colon with diverticulosis. Recommendation was for maintenance avastin x 1 year, however patient then came to [facility] in 02-2016. She has not established care with any physician since coming to [place]. Unfortunately, after paracentesis she began having increasing abdominal pain. Abdominal x-ray revealed distended small bowel loops concerning for small bowel obstruction. Gen. surgery was consulted. NG was inserted to suction. Because of prolonged Hospitalization she was placed on TPN/TNA. She underwent Chemotheray with Doxil on 12/26. Repeat U/S 12/31 showed interval progression of ascites and She is s/p repeat Paracentesis 06-06-16 which removed 4 liters. She was subsequently discharged      05/21/2016 Tumor Marker    Patient's tumor was tested for the following markers: CA125 Results of the tumor marker test revealed 249.8      05/22/2016 Procedure    Successful ultrasound-guided paracentesis yielding 2.9 liters of peritoneal fluid.  05/22/2016 Pathology Results    PERITONEAL/ASCITIC FLUID, 1 OF 1 COLLECTED ON  05/22/16: ADENOCARCINOMA. ADDENDUM: Immunohistochemistry is performed on the cell block and the tumor is strongly positive with cytokeratin 7 and cytokeratin 8/18 and shows patchy positivity with estrogen receptor, cytokeratin 5/6, WT-1 and MOC-31 with focal weak positivity with PAX-8. The tumor is negative with thyroglobulin, thyroid transcription factor-1, Napsin-A, progesterone receptor, GATA 3, gross cystic disease fluid protein, cytokeratin 20, CDX-2 and Calretinin. 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       05/24/2016 Procedure    Ultrasound and fluoroscopically guided right internal jugular single lumen power port catheter insertion. Tip in the SVC/RA junction. Catheter ready for use      05/24/2016 Imaging    LV EF: 65% -   70%      05/28/2016 Imaging    Small volume of abdominal and pelvic ascites is identified and inadequate for paracentesis.      05/30/2016 - 08/21/2016 Chemotherapy    She received doxorubicin x 3 cycles, stopped due to disease progression      06/04/2016 Imaging    Increased amount of ascites, freely distributed in all 4 quadrants. No other acute finding by ultrasound.      06/06/2016 Imaging    Small left greater than right pleural effusions, increased compared to prior CT. Interim development of partial consolidations within the bilateral lower lobes which may reflect atelectasis or pneumonia. Few scattered foci of ground-glass density within the right upper and left upper lobes, suspect small foci of inflammation or infection. 2. Moderate to large volume of ascites in the upper abdomen with extensive mesenteric and omental metastatic disease partially visualized. 3. Dilated gallbladder.      06/08/2016 Procedure    Successful ultrasound and fluoroscopic guided placement of a tunneled peritoneal drainage catheter.  2. Successful aspiration of 900 cc of serous ascites following tunneled peritoneal drainage catheter placement.      06/19/2016 Tumor Marker    Patient's tumor was tested for the following markers: CA125 Results of the tumor marker test revealed 166.6      07/24/2016 Tumor Marker    Patient's tumor was tested for the following markers: CA125 Results of the tumor marker test revealed 104.4      08/18/2016 Imaging    Extensive peritoneal metastatic disease within the abdomen. This is somewhat difficult to compare with prior given diffuse nature however there is suggestion of interval progression. Interval resolution of previously described bilateral pleural effusions. Peritoneal drainage catheter is present with tip terminating in the right upper quadrant. Interval decrease in now small volume ascites. Aortic atherosclerosis. Diverticulosis without CT evidence for acute diverticulitis.      08/18/2016 Imaging    LV EF: 60% -  65%      08/21/2016 Tumor Marker    Patient's tumor was tested for the following markers: CA125 Results of the tumor marker test revealed 96.1      08/29/2016 -  Chemotherapy    She received gemzar and carboplatin      09/05/2016 Tumor Marker    Patient's tumor was tested for the following markers: CA125 Results of the tumor marker test revealed 48.1      09/20/2016 Procedure    Status post bedside removal of tunneled right-sided peritoneal drain which was removed in its entirety.      10/03/2016 Tumor Marker    Patient's tumor  was tested for the following markers: CA125 Results of the tumor marker test revealed 22.9       INTERVAL HISTORY: Please see below for problem oriented charting. She returns for further follow-up with her daughter Her daughter serves as an interpreter She is eating well and gaining weight She denies abdominal pain She denies significant nausea or vomiting with Gemzar only but she had severe nausea and vomiting with  carboplatin. No changes in bowel habits Denies peripheral neuropathy  REVIEW OF SYSTEMS:   Constitutional: Denies fevers, chills or abnormal weight loss Eyes: Denies blurriness of vision Ears, nose, mouth, throat, and face: Denies mucositis or sore throat Respiratory: Denies cough, dyspnea or wheezes Cardiovascular: Denies palpitation, chest discomfort or lower extremity swelling Skin: Denies abnormal skin rashes Lymphatics: Denies new lymphadenopathy or easy bruising Neurological:Denies numbness, tingling or new weaknesses Behavioral/Psych: Mood is stable, no new changes  All other systems were reviewed with the patient and are negative.  I have reviewed the past medical history, past surgical history, social history and family history with the patient and they are unchanged from previous note.  ALLERGIES:  has No Known Allergies.  MEDICATIONS:  Current Outpatient Prescriptions  Medication Sig Dispense Refill  . acetaminophen (TYLENOL) 325 MG tablet Take 650 mg by mouth every 5 (five) hours as needed for moderate pain.    Marland Kitchen alum & mag hydroxide-simeth (MAALOX/MYLANTA) 200-200-20 MG/5ML suspension Take 60 mLs by mouth every 4 (four) hours as needed for indigestion. (Patient not taking: Reported on 06/19/2016) 355 mL 0  . Glycerin, Adult, 2.1 g SUPP Place 1 suppository rectally as needed for moderate constipation. (Patient not taking: Reported on 06/19/2016) 10 suppository 0  . lactulose (CHRONULAC) 10 GM/15ML solution 10 ml three times a day by mouth 240 mL 0  . lidocaine-prilocaine (EMLA) cream Apply to port 1 hour before access with needle. Cover with plastic wrap. 30 g 11  . magnesium 30 MG tablet Take 30 mg by mouth daily.     . mirtazapine (REMERON) 15 MG tablet Take 1 tablet (15 mg total) by mouth at bedtime. 30 tablet 9  . ondansetron (ZOFRAN ODT) 8 MG disintegrating tablet Take 1 tablet (8 mg total) by mouth every 8 (eight) hours as needed for nausea or vomiting. 30 tablet 0  .  oxyCODONE (OXY IR/ROXICODONE) 5 MG immediate release tablet Take 1 tablet (5 mg total) by mouth every 4 (four) hours as needed for severe pain. 60 tablet 0  . oxyCODONE-acetaminophen (PERCOCET/ROXICET) 5-325 MG tablet Take 1 tablet by mouth every 4 (four) hours as needed for moderate pain or severe pain. 10 tablet 0  . pantoprazole (PROTONIX) 40 MG tablet Take 1 tablet (40 mg total) by mouth daily. 30 tablet 1  . polyethylene glycol (MIRALAX) packet Take 17 g by mouth daily. 14 each 9  . Potassium 75 MG TABS Take 75 mg by mouth daily.    . promethazine (PHENERGAN) 25 MG tablet Take 1 tablet (25 mg total) by mouth every 6 (six) hours as needed for nausea or vomiting. 30 tablet 9  . protein supplement (UNJURY CHICKEN SOUP) POWD Take 27 g (8 oz total) by mouth 2 (two) times daily with a meal. 27 g 0  . senna-docusate (SENOKOT-S) 8.6-50 MG tablet Take 2 tablets by mouth 2 (two) times daily. 60 tablet 9  . simethicone (GAS-X) 80 MG chewable tablet Chew 160 mg by mouth every 6 (six) hours as needed for flatulence.     No current facility-administered medications  for this visit.    Facility-Administered Medications Ordered in Other Visits  Medication Dose Route Frequency Provider Last Rate Last Dose  . sodium chloride flush (NS) 0.9 % injection 10 mL  10 mL Intracatheter PRN Heath Lark, MD   10 mL at 10/17/16 1724    PHYSICAL EXAMINATION: ECOG PERFORMANCE STATUS: 1 - Symptomatic but completely ambulatory  Vitals:   10/17/16 1342  BP: 126/63  Pulse: 80  Resp: 18  Temp: 98.9 F (37.2 C)   Filed Weights   10/17/16 1342  Weight: 153 lb 6.4 oz (69.6 kg)    GENERAL:alert, no distress and comfortable SKIN: skin color, texture, turgor are normal, no rashes or significant lesions EYES: normal, Conjunctiva are pink and non-injected, sclera clear OROPHARYNX:no exudate, no erythema and lips, buccal mucosa, and tongue normal  NECK: supple, thyroid normal size, non-tender, without nodularity LYMPH:   no palpable lymphadenopathy in the cervical, axillary or inguinal LUNGS: clear to auscultation and percussion with normal breathing effort HEART: regular rate & rhythm and no murmurs and no lower extremity edema ABDOMEN:abdomen soft, non-tender and normal bowel sounds Musculoskeletal:no cyanosis of digits and no clubbing  NEURO: alert & oriented x 3 with fluent speech, no focal motor/sensory deficits  LABORATORY DATA:  I have reviewed the data as listed    Component Value Date/Time   NA 138 10/17/2016 1241   K 4.5 10/17/2016 1241   CL 100 (L) 09/20/2016 1252   CO2 25 10/17/2016 1241   GLUCOSE 102 10/17/2016 1241   BUN 7.7 10/17/2016 1241   CREATININE 0.7 10/17/2016 1241   CALCIUM 9.8 10/17/2016 1241   PROT 7.3 10/17/2016 1241   ALBUMIN 3.7 10/17/2016 1241   AST 15 10/17/2016 1241   ALT 15 10/17/2016 1241   ALKPHOS 82 10/17/2016 1241   BILITOT 0.35 10/17/2016 1241   GFRNONAA >60 09/20/2016 1252   GFRAA >60 09/20/2016 1252    No results found for: SPEP, UPEP  Lab Results  Component Value Date   WBC 3.8 (L) 10/17/2016   NEUTROABS 1.7 10/17/2016   HGB 10.9 (L) 10/17/2016   HCT 32.8 (L) 10/17/2016   MCV 86.0 10/17/2016   PLT 477 (H) 10/17/2016      Chemistry      Component Value Date/Time   NA 138 10/17/2016 1241   K 4.5 10/17/2016 1241   CL 100 (L) 09/20/2016 1252   CO2 25 10/17/2016 1241   BUN 7.7 10/17/2016 1241   CREATININE 0.7 10/17/2016 1241      Component Value Date/Time   CALCIUM 9.8 10/17/2016 1241   ALKPHOS 82 10/17/2016 1241   AST 15 10/17/2016 1241   ALT 15 10/17/2016 1241   BILITOT 0.35 10/17/2016 1241       RADIOGRAPHIC STUDIES: I have personally reviewed the radiological images as listed and agreed with the findings in the report. Ir Removal Perm Peritoneal Cath  Result Date: 09/20/2016 INDICATION: 76 year old female with a history of malignant ascites. Prior catheter placed June 08, 2016. Catheter has stopped draining during her  treatment. EXAM: PERITONEAL CATH REMOVAL (PERM) MEDICATIONS: 2.0 g Ancef The antibiotics were administered within an appropriate time frame prior to the initiation of the procedure. ANESTHESIA/SEDATION: Fentanyl 2.0 mcg IV; Versed 100 mg IV Moderate Sedation Time:  10 minutes The patient was continuously monitored during the procedure by the interventional radiology nurse under my direct supervision. COMPLICATIONS: None PROCEDURE: Informed written consent was obtained from the patient and the patient's family with an interpreter after a thorough discussion  of the procedural risks, benefits and alternatives. All questions were addressed. Maximal Sterile Barrier Technique was utilized including caps, mask, sterile gowns, sterile gloves, sterile drape, hand hygiene and skin antiseptic. A timeout was performed prior to the initiation of the procedure. Patient position on the table in a supine position. The abdomen in the indwelling drain were prepped and draped in the usual sterile fashion. The skin and subcutaneous tissues were generously infiltrated 1% lidocaine for local anesthesia. Using sharp/blunt dissection, the catheter was freed from the tract and removed in its entirety. Sterile dressing was placed. Patient tolerated the procedure well and remained hemodynamically stable throughout. No complications were encountered and no significant blood loss. IMPRESSION: Status post bedside removal of tunneled right-sided peritoneal drain which was removed in its entirety. Signed, Dulcy Fanny. Earleen Newport, DO [facility] Electronically Signed   By: Corrie Mckusick D.O.   On: 09/20/2016 17:21    ASSESSMENT & PLAN:  Ovarian carcinoma ([place]) She tolerated treatment well except for fatigue and nausea We will proceed with treatment without dose adjustment I have adjusted her antiemetics I will see her back in 2 weeks prior to next treatment My plan would be to repeat CT  imaging after 3 cycles of treatment If CT scan show positive response to treatment, I would add a Avastin to the treatment plan with plan for future maintenance Avastin  Pancytopenia, acquired ([place]) She had mild pancytopenia from recent treatment She is not symptomatic We will proceed with treatment without dose adjustment  Malignant ascites She has no signs or symptoms of recurrence of ascites We will continue monitor carefully   Orders Placed This Encounter  Procedures  . CT ABDOMEN PELVIS W CONTRAST    Standing Status:   Future    Standing Expiration Date:   01/17/2018    Order Specific Question:   Reason for Exam (SYMPTOM  OR DIAGNOSIS REQUIRED)    Answer:   staging ovarian ca, assess response to Rx    Order Specific Question:   Preferred imaging location?    Answer:   [facility]   All questions were answered. The patient knows to call the clinic with any problems, questions or concerns. No barriers to learning was detected. I spent 15 minutes counseling the patient face to face. The total time spent in the appointment was 20 minutes and more than 50% was on counseling and review of test results     Heath Lark, MD 10/17/2016 5:34 PM

## 2016-10-17 NOTE — Patient Instructions (Signed)

## 2016-10-17 NOTE — Assessment & Plan Note (Signed)
She had mild pancytopenia from recent treatment She is not symptomatic We will proceed with treatment without dose adjustment

## 2016-10-20 MED FILL — LACTULOSE 10 GM/15 ML SOLN: 10 | 8 days supply | Qty: 240 | Fill #0

## 2016-10-20 MED FILL — PROMETHAZINE 25 MG TABLET: 25 | 7 days supply | Qty: 30 | Fill #0

## 2016-10-24 ENCOUNTER — Other Ambulatory Visit (HOSPITAL_BASED_OUTPATIENT_CLINIC_OR_DEPARTMENT_OTHER): Payer: Self-pay

## 2016-10-24 ENCOUNTER — Ambulatory Visit (HOSPITAL_BASED_OUTPATIENT_CLINIC_OR_DEPARTMENT_OTHER): Payer: Self-pay

## 2016-10-24 ENCOUNTER — Ambulatory Visit: Payer: Self-pay

## 2016-10-24 VITALS — BP 129/75 | HR 78 | Temp 98.2°F | Resp 16

## 2016-10-24 DIAGNOSIS — C569 Malignant neoplasm of unspecified ovary: Secondary | ICD-10-CM

## 2016-10-24 DIAGNOSIS — Z95828 Presence of other vascular implants and grafts: Secondary | ICD-10-CM

## 2016-10-24 DIAGNOSIS — C801 Malignant (primary) neoplasm, unspecified: Secondary | ICD-10-CM

## 2016-10-24 DIAGNOSIS — C786 Secondary malignant neoplasm of retroperitoneum and peritoneum: Secondary | ICD-10-CM

## 2016-10-24 DIAGNOSIS — Z5111 Encounter for antineoplastic chemotherapy: Secondary | ICD-10-CM

## 2016-10-24 LAB — CBC WITH DIFFERENTIAL/PLATELET
BASO%: 0.3 % (ref 0.0–2.0)
Basophils Absolute: 0 10*3/uL (ref 0.0–0.1)
EOS%: 0 % (ref 0.0–7.0)
Eosinophils Absolute: 0 10*3/uL (ref 0.0–0.5)
HEMATOCRIT: 31.1 % — AB (ref 34.8–46.6)
HEMOGLOBIN: 10.1 g/dL — AB (ref 11.6–15.9)
LYMPH#: 1.7 10*3/uL (ref 0.9–3.3)
LYMPH%: 50.3 % — ABNORMAL HIGH (ref 14.0–49.7)
MCH: 28.5 pg (ref 25.1–34.0)
MCHC: 32.5 g/dL (ref 31.5–36.0)
MCV: 87.6 fL (ref 79.5–101.0)
MONO#: 0.1 10*3/uL (ref 0.1–0.9)
MONO%: 4 % (ref 0.0–14.0)
NEUT%: 45.4 % (ref 38.4–76.8)
NEUTROS ABS: 1.6 10*3/uL (ref 1.5–6.5)
Platelets: 328 10*3/uL (ref 145–400)
RBC: 3.55 10*6/uL — ABNORMAL LOW (ref 3.70–5.45)
RDW: 19.2 % — AB (ref 11.2–14.5)
WBC: 3.5 10*3/uL — AB (ref 3.9–10.3)

## 2016-10-24 LAB — COMPREHENSIVE METABOLIC PANEL
ALT: 26 U/L (ref 0–55)
AST: 25 U/L (ref 5–34)
Albumin: 3.8 g/dL (ref 3.5–5.0)
Alkaline Phosphatase: 97 U/L (ref 40–150)
Anion Gap: 8 mEq/L (ref 3–11)
BILIRUBIN TOTAL: 0.34 mg/dL (ref 0.20–1.20)
BUN: 11.3 mg/dL (ref 7.0–26.0)
CALCIUM: 10.5 mg/dL — AB (ref 8.4–10.4)
CO2: 26 mEq/L (ref 22–29)
CREATININE: 0.8 mg/dL (ref 0.6–1.1)
Chloride: 102 mEq/L (ref 98–109)
EGFR: 77 mL/min/{1.73_m2} — ABNORMAL LOW (ref >90)
GLUCOSE: 142 mg/dL — AB (ref 70–140)
POTASSIUM: 4.4 meq/L (ref 3.5–5.1)
SODIUM: 137 meq/L (ref 136–145)
Total Protein: 7.2 g/dL (ref 6.4–8.3)

## 2016-10-24 MED ORDER — ONDANSETRON HCL 4 MG/2ML IJ SOLN
INTRAMUSCULAR | Status: AC
  Filled 2016-10-24: qty 4

## 2016-10-24 MED ORDER — SODIUM CHLORIDE 0.9 % IV SOLN
Freq: Once | INTRAVENOUS | Status: AC
  Administered 2016-10-24: 14:00:00 via INTRAVENOUS

## 2016-10-24 MED ORDER — SODIUM CHLORIDE 0.9% FLUSH
10.0000 mL | Freq: Once | INTRAVENOUS | Status: AC
  Administered 2016-10-24: 10 mL
  Filled 2016-10-24: qty 10

## 2016-10-24 MED ORDER — SODIUM CHLORIDE 0.9 % IV SOLN
800.0000 mg/m2 | Freq: Once | INTRAVENOUS | Status: AC
  Administered 2016-10-24: 1444 mg via INTRAVENOUS
  Filled 2016-10-24: qty 37.98

## 2016-10-24 MED ORDER — HEPARIN SOD (PORK) LOCK FLUSH 100 UNIT/ML IV SOLN
500.0000 [IU] | Freq: Once | INTRAVENOUS | Status: AC | PRN
  Administered 2016-10-24: 500 [IU]
  Filled 2016-10-24: qty 5

## 2016-10-24 MED ORDER — ONDANSETRON HCL 4 MG/2ML IJ SOLN
8.0000 mg | Freq: Once | INTRAMUSCULAR | Status: AC
  Administered 2016-10-24: 8 mg via INTRAVENOUS

## 2016-10-24 MED ORDER — SODIUM CHLORIDE 0.9% FLUSH
10.0000 mL | INTRAVENOUS | Status: DC | PRN
  Administered 2016-10-24: 10 mL
  Filled 2016-10-24: qty 10

## 2016-10-24 NOTE — Patient Instructions (Signed)
[facility] Discharge Instructions for Patients Receiving Chemotherapy  Today you received the following chemotherapy agents: gemcitabine (Gemzar).  To help prevent nausea and vomiting after your treatment, we encourage you to take your nausea medication as prescribed.  If you develop nausea and vomiting that is not controlled by your nausea medication, call the clinic.   BELOW ARE SYMPTOMS THAT SHOULD BE REPORTED IMMEDIATELY:  *FEVER GREATER THAN 100.5 F  *CHILLS WITH OR WITHOUT FEVER  NAUSEA AND VOMITING THAT IS NOT CONTROLLED WITH YOUR NAUSEA MEDICATION  *UNUSUAL SHORTNESS OF BREATH  *UNUSUAL BRUISING OR BLEEDING  TENDERNESS IN MOUTH AND THROAT WITH OR WITHOUT PRESENCE OF ULCERS  *URINARY PROBLEMS  *BOWEL PROBLEMS  UNUSUAL RASH Items with * indicate a potential emergency and should be followed up as soon as possible.  Feel free to call the clinic you have any questions or concerns. The clinic phone number is [phone].  Please show the [facility] at check-in to the Emergency Department and triage nurse.

## 2016-10-31 ENCOUNTER — Telehealth: Payer: Self-pay

## 2016-10-31 NOTE — Telephone Encounter (Signed)
Called Radiology Scheduler they will call patient and schedule.

## 2016-10-31 NOTE — Telephone Encounter (Signed)
-----   Message from Loletta Parish sent at 10/31/2016  9:51 AM EDT ----- Regarding: RE: Ct scan not scheduled Referral has been marked as authorized.   Thanks ----- Message ----- From: Heath Lark, MD Sent: 10/31/2016   6:33 AM To: Patton Salles, RN, Flo Shanks, RN, # Subject: Ct scan not scheduled                          I don't think she has insurance. Can we get CT scheduled for 6/4?

## 2016-11-03 ENCOUNTER — Encounter (HOSPITAL_COMMUNITY): Payer: Self-pay

## 2016-11-03 ENCOUNTER — Ambulatory Visit (HOSPITAL_COMMUNITY)
Admission: RE | Admit: 2016-11-03 | Discharge: 2016-11-03 | Disposition: A | Discharge status: Home / Self Care | Payer: Self-pay | Source: Ambulatory Visit | Attending: Hematology and Oncology | Admitting: Hematology and Oncology

## 2016-11-03 DIAGNOSIS — I700 Atherosclerosis of aorta: Secondary | ICD-10-CM | POA: Insufficient documentation

## 2016-11-03 DIAGNOSIS — C569 Malignant neoplasm of unspecified ovary: Secondary | ICD-10-CM | POA: Insufficient documentation

## 2016-11-03 DIAGNOSIS — K5730 Diverticulosis of large intestine without perforation or abscess without bleeding: Secondary | ICD-10-CM | POA: Insufficient documentation

## 2016-11-03 MED ORDER — IOPAMIDOL (ISOVUE-300) INJECTION 61%
100.0000 mL | Freq: Once | INTRAVENOUS | Status: AC | PRN
  Administered 2016-11-03: 100 mL via INTRAVENOUS

## 2016-11-03 MED ORDER — IOPAMIDOL (ISOVUE-300) INJECTION 61%
INTRAVENOUS | Status: AC
  Filled 2016-11-03: qty 100

## 2016-11-06 ENCOUNTER — Other Ambulatory Visit: Payer: Self-pay

## 2016-11-07 ENCOUNTER — Ambulatory Visit (HOSPITAL_BASED_OUTPATIENT_CLINIC_OR_DEPARTMENT_OTHER): Payer: Self-pay

## 2016-11-07 ENCOUNTER — Telehealth: Payer: Self-pay | Admitting: Hematology and Oncology

## 2016-11-07 ENCOUNTER — Ambulatory Visit (HOSPITAL_BASED_OUTPATIENT_CLINIC_OR_DEPARTMENT_OTHER): Payer: Self-pay | Admitting: Hematology and Oncology

## 2016-11-07 DIAGNOSIS — D6481 Anemia due to antineoplastic chemotherapy: Secondary | ICD-10-CM

## 2016-11-07 DIAGNOSIS — Z5111 Encounter for antineoplastic chemotherapy: Secondary | ICD-10-CM

## 2016-11-07 DIAGNOSIS — T451X5A Adverse effect of antineoplastic and immunosuppressive drugs, initial encounter: Secondary | ICD-10-CM

## 2016-11-07 DIAGNOSIS — R110 Nausea: Secondary | ICD-10-CM

## 2016-11-07 DIAGNOSIS — C801 Malignant (primary) neoplasm, unspecified: Secondary | ICD-10-CM

## 2016-11-07 DIAGNOSIS — C786 Secondary malignant neoplasm of retroperitoneum and peritoneum: Secondary | ICD-10-CM

## 2016-11-07 DIAGNOSIS — C569 Malignant neoplasm of unspecified ovary: Secondary | ICD-10-CM

## 2016-11-07 LAB — CBC WITH DIFFERENTIAL/PLATELET
BASO%: 0.3 % (ref 0.0–2.0)
BASOS ABS: 0 10*3/uL (ref 0.0–0.1)
EOS ABS: 0 10*3/uL (ref 0.0–0.5)
EOS%: 0.3 % (ref 0.0–7.0)
HCT: 30.5 % — ABNORMAL LOW (ref 34.8–46.6)
HGB: 9.8 g/dL — ABNORMAL LOW (ref 11.6–15.9)
LYMPH%: 35.4 % (ref 14.0–49.7)
MCH: 29.2 pg (ref 25.1–34.0)
MCHC: 32.1 g/dL (ref 31.5–36.0)
MCV: 90.8 fL (ref 79.5–101.0)
MONO#: 1 10*3/uL — AB (ref 0.1–0.9)
MONO%: 23.9 % — ABNORMAL HIGH (ref 0.0–14.0)
NEUT%: 40.1 % (ref 38.4–76.8)
NEUTROS ABS: 1.6 10*3/uL (ref 1.5–6.5)
PLATELETS: 284 10*3/uL (ref 145–400)
RBC: 3.36 10*6/uL — AB (ref 3.70–5.45)
RDW: 22.6 % — ABNORMAL HIGH (ref 11.2–14.5)
WBC: 4 10*3/uL (ref 3.9–10.3)
lymph#: 1.4 10*3/uL (ref 0.9–3.3)

## 2016-11-07 LAB — COMPREHENSIVE METABOLIC PANEL
ALK PHOS: 91 U/L (ref 40–150)
ALT: 15 U/L (ref 0–55)
ANION GAP: 9 meq/L (ref 3–11)
AST: 17 U/L (ref 5–34)
Albumin: 3.8 g/dL (ref 3.5–5.0)
BILIRUBIN TOTAL: 0.22 mg/dL (ref 0.20–1.20)
BUN: 14.1 mg/dL (ref 7.0–26.0)
CO2: 26 meq/L (ref 22–29)
Calcium: 9.4 mg/dL (ref 8.4–10.4)
Chloride: 104 mEq/L (ref 98–109)
Creatinine: 0.8 mg/dL (ref 0.6–1.1)
EGFR: 74 mL/min/{1.73_m2} — AB (ref >90)
Glucose: 112 mg/dl (ref 70–140)
Potassium: 4.8 mEq/L (ref 3.5–5.1)
Sodium: 139 mEq/L (ref 136–145)
Total Protein: 7.1 g/dL (ref 6.4–8.3)

## 2016-11-07 MED ORDER — SODIUM CHLORIDE 0.9 % IV SOLN
Freq: Once | INTRAVENOUS | Status: AC
  Administered 2016-11-07: 15:00:00 via INTRAVENOUS
  Filled 2016-11-07: qty 5

## 2016-11-07 MED ORDER — SODIUM CHLORIDE 0.9 % IV SOLN
Freq: Once | INTRAVENOUS | Status: AC
  Administered 2016-11-07: 15:00:00 via INTRAVENOUS

## 2016-11-07 MED ORDER — CARBOPLATIN CHEMO INTRADERMAL TEST DOSE 100MCG/0.02ML
100.0000 ug | Freq: Once | INTRADERMAL | Status: AC
  Administered 2016-11-07: 100 ug via INTRADERMAL
  Filled 2016-11-07: qty 0.02

## 2016-11-07 MED ORDER — PALONOSETRON HCL INJECTION 0.25 MG/5ML
INTRAVENOUS | Status: AC
  Filled 2016-11-07: qty 5

## 2016-11-07 MED ORDER — HEPARIN SOD (PORK) LOCK FLUSH 100 UNIT/ML IV SOLN
500.0000 [IU] | Freq: Once | INTRAVENOUS | Status: AC | PRN
  Administered 2016-11-07: 500 [IU]
  Filled 2016-11-07: qty 5

## 2016-11-07 MED ORDER — SODIUM CHLORIDE 0.9% FLUSH
10.0000 mL | INTRAVENOUS | Status: DC | PRN
  Administered 2016-11-07: 10 mL
  Filled 2016-11-07: qty 10

## 2016-11-07 MED ORDER — SODIUM CHLORIDE 0.9 % IV SOLN
800.0000 mg/m2 | Freq: Once | INTRAVENOUS | Status: AC
  Administered 2016-11-07: 1444 mg via INTRAVENOUS
  Filled 2016-11-07: qty 37.98

## 2016-11-07 MED ORDER — SODIUM CHLORIDE 0.9 % IV SOLN
403.5000 mg | Freq: Once | INTRAVENOUS | Status: AC
  Administered 2016-11-07: 400 mg via INTRAVENOUS
  Filled 2016-11-07: qty 40

## 2016-11-07 MED ORDER — PALONOSETRON HCL INJECTION 0.25 MG/5ML
0.2500 mg | Freq: Once | INTRAVENOUS | Status: AC
Start: 2016-11-07 — End: 2016-11-07
  Administered 2016-11-07: 0.25 mg via INTRAVENOUS

## 2016-11-07 NOTE — Telephone Encounter (Signed)
Gave patient AVS and calender per 6/5 los.

## 2016-11-07 NOTE — Telephone Encounter (Signed)
Labs, flush and Chemo (5 hrs) was scheduled on 11/28/16, per 068/05/18 los. Pump D/C and follow up appointment scheduled with Dr Alvy Bimler on 11/28/16 @ 12:30/30, per 11/07/16 los. Labs, flush and Chemo (3 1/2 hrs) was scheduled for 12/01/16 and 12/08/16, per 11/07/16 los. Patient was given a copy of the AVS report and appointment schedule, per 11/07/16 los.

## 2016-11-07 NOTE — Patient Instructions (Signed)
[facility] Discharge Instructions for Patients Receiving Chemotherapy  Today you received the following chemotherapy agents:  Gemzar and Carboplatin.  To help prevent nausea and vomiting after your treatment, we encourage you to take your nausea medication as prescribed.   If you develop nausea and vomiting that is not controlled by your nausea medication, call the clinic.   BELOW ARE SYMPTOMS THAT SHOULD BE REPORTED IMMEDIATELY:  *FEVER GREATER THAN 100.5 F  *CHILLS WITH OR WITHOUT FEVER  NAUSEA AND VOMITING THAT IS NOT CONTROLLED WITH YOUR NAUSEA MEDICATION  *UNUSUAL SHORTNESS OF BREATH  *UNUSUAL BRUISING OR BLEEDING  TENDERNESS IN MOUTH AND THROAT WITH OR WITHOUT PRESENCE OF ULCERS  *URINARY PROBLEMS  *BOWEL PROBLEMS  UNUSUAL RASH Items with * indicate a potential emergency and should be followed up as soon as possible.  Feel free to call the clinic you have any questions or concerns. The clinic phone number is [phone].  Please show the CHEMO ALERT CARD at check-in to the Emergency Department and triage nurse.   

## 2016-11-08 ENCOUNTER — Encounter: Payer: Self-pay | Admitting: Hematology and Oncology

## 2016-11-08 LAB — CA 125: Cancer Antigen (CA) 125: 9.6 U/mL (ref 0.0–38.1)

## 2016-11-08 NOTE — Assessment & Plan Note (Addendum)
She tolerated treatment well except for fatigue and nausea We will proceed with treatment without dose adjustment I have adjusted her antiemetics CT scan show excellent response to treatment. I recommend we continue minimum 6 cycles of treatment.  I would consider adding Avastin to future treatment but would prefer to hold off due to concern for possible bowel obstruction due to location of the disease

## 2016-11-08 NOTE — Assessment & Plan Note (Signed)

## 2016-11-08 NOTE — Assessment & Plan Note (Signed)
She had recent severe nausea and vomiting. We review anti-emetics regimen. I have recently prescribed additional antiemetics and so far her nausea is stable

## 2016-11-08 NOTE — Progress Notes (Signed)
[place] OFFICE PROGRESS NOTE  Patient Care Team: Patient, No Pcp Per as PCP - General (General Practice)  SUMMARY OF ONCOLOGIC HISTORY:   Ovarian carcinoma ([place])   04/19/2015 Initial Diagnosis    In late 03-2015 she in [place] and was found to have large pelvic mass and perihepatic ascites. CA 125 then was 359, with CEA of 3, CA 19-9 5.8 and alpha fetoprotein 2.3. She had exploratory laparotomy 04-19-15, not done by gyn oncologist, with bilateral ovaried an tumors, 10 cm with capsule ruptured on right , smaller on left, peritoneal carcinomatosis.       05/20/2015 Miscellaneous    She had some bowel obstruction post operatively, with NG used . Pathology reportedcystic and solid left ovarian mass, 13 cm right ovarian mass, moderately differentiated tumor "tumor de celulas de la granulosa". 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 CT AP 05-2015 had ascites, carcinomatosis and multiple peritoneal implants. CA 125 on 05-24-15 baseline for chemo was 141. She received 3 cycles of taxol + carboplatin from 05-26-15 thru 07-07-15 (not clear if avastin given those cycles), after which carboplatin was not available in [place]. She received taxol 175 mg/m2, CDDP and avastin for 3 additional cycles thru 10-07-15. Second look laparotomy was discussed, patient declined, and instead had 4 cycles avastin + taxol from 12-01-15 thru 02-02-16 (apparentlyno CDDP after 10-07-15 and no carboplatin after 07-07-15). CT CAP 02-24-16 reportedly had no evidence of malignancy, with some bullous emphysema, tortuous sigmoid colon with diverticulosis. . Recommendation was for maintenance avastin x 1 year, however patient then came to  to be with family.       05/20/2016 Imaging    Extensive peritoneal disease with omental thickening/ 18 and large volume ascites. 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  Trace bilateral pleural effusions. Moderate hiatal hernia. Bilateral inguinal hernias containing fat. Left colonic diverticulosis. Aortoiliac atherosclerosis.      05/20/2016 - 06/13/2016 Hospital Admission    76 year old female with a history of metastatic ovarian cancer diagnosed in January 2017 (s/p primary debulking surgery followed by 8 adjuvant cycles of carboplatin and paclitaxel and 4 additional cycles of carboplatin, paclitaxel, avastin), last chemo Sept 2017, presented with progressive abdominal bloating and pain for 3 weeks duration. She was feeling well until the first week of December, 2017 when she was sick with a virus from which she never recovered. She states that she began feeling distended in the abdomen and uncomfortable and was taken to the ED on 05/20/16. CT chest/abd/pelvis9-21-17 reportedly with no evidence of malignancy, with some bullous emphysema, tortuous sigmoid colon with diverticulosis. Recommendation was for maintenance avastin x 1 year, however patient then came to [facility] in 02-2016. She has not established care with any physician since coming to [place]. Unfortunately, after paracentesis she began having increasing abdominal pain. Abdominal x-ray revealed distended small bowel loops concerning for small bowel obstruction. Gen. surgery was consulted. NG was inserted to suction. Because of prolonged Hospitalization she was placed on TPN/TNA. She underwent Chemotheray with Doxil on 12/26. Repeat U/S 12/31 showed interval progression of ascites and She is s/p repeat Paracentesis 06-06-16 which removed 4 liters. She was subsequently discharged      05/21/2016 Tumor Marker    Patient's tumor was tested for the following markers: CA125 Results of the tumor marker test revealed 249.8      05/22/2016 Procedure    Successful ultrasound-guided paracentesis yielding 2.9 liters of peritoneal fluid.  05/22/2016 Pathology Results    PERITONEAL/ASCITIC FLUID, 1 OF 1 COLLECTED ON  05/22/16: ADENOCARCINOMA. ADDENDUM: Immunohistochemistry is performed on the cell block and the tumor is strongly positive with cytokeratin 7 and cytokeratin 8/18 and shows patchy positivity with estrogen receptor, cytokeratin 5/6, WT-1 and MOC-31 with focal weak positivity with PAX-8. The tumor is negative with thyroglobulin, thyroid transcription factor-1, Napsin-A, progesterone receptor, GATA 3, gross cystic disease fluid protein, cytokeratin 20, CDX-2 and Calretinin. 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       05/24/2016 Procedure    Ultrasound and fluoroscopically guided right internal jugular single lumen power port catheter insertion. Tip in the SVC/RA junction. Catheter ready for use      05/24/2016 Imaging    LV EF: 65% -   70%      05/28/2016 Imaging    Small volume of abdominal and pelvic ascites is identified and inadequate for paracentesis.      05/30/2016 - 08/21/2016 Chemotherapy    She received doxorubicin x 3 cycles, stopped due to disease progression      06/04/2016 Imaging    Increased amount of ascites, freely distributed in all 4 quadrants. No other acute finding by ultrasound.      06/06/2016 Imaging    Small left greater than right pleural effusions, increased compared to prior CT. Interim development of partial consolidations within the bilateral lower lobes which may reflect atelectasis or pneumonia. Few scattered foci of ground-glass density within the right upper and left upper lobes, suspect small foci of inflammation or infection. 2. Moderate to large volume of ascites in the upper abdomen with extensive mesenteric and omental metastatic disease partially visualized. 3. Dilated gallbladder.      06/08/2016 Procedure    Successful ultrasound and fluoroscopic guided placement of a tunneled peritoneal drainage catheter.  2. Successful aspiration of 900 cc of serous ascites following tunneled peritoneal drainage catheter placement.      06/19/2016 Tumor Marker    Patient's tumor was tested for the following markers: CA125 Results of the tumor marker test revealed 166.6      07/24/2016 Tumor Marker    Patient's tumor was tested for the following markers: CA125 Results of the tumor marker test revealed 104.4      08/18/2016 Imaging    Extensive peritoneal metastatic disease within the abdomen. This is somewhat difficult to compare with prior given diffuse nature however there is suggestion of interval progression. Interval resolution of previously described bilateral pleural effusions. Peritoneal drainage catheter is present with tip terminating in the right upper quadrant. Interval decrease in now small volume ascites. Aortic atherosclerosis. Diverticulosis without CT evidence for acute diverticulitis.      08/18/2016 Imaging    LV EF: 60% -  65%      08/21/2016 Tumor Marker    Patient's tumor was tested for the following markers: CA125 Results of the tumor marker test revealed 96.1      08/29/2016 -  Chemotherapy    She received gemzar and carboplatin      09/05/2016 Tumor Marker    Patient's tumor was tested for the following markers: CA125 Results of the tumor marker test revealed 48.1      09/20/2016 Procedure    Status post bedside removal of tunneled right-sided peritoneal drain which was removed in its entirety.      10/03/2016 Tumor Marker    Patient's tumor  was tested for the following markers: CA125 Results of the tumor marker test revealed 22.9      11/03/2016 Imaging    Interval decrease in peritoneal metastatic disease when compared with prior exam. Aortic atherosclerosis. Sigmoid colonic diverticulosis.      11/07/2016 Tumor Marker    Patient's tumor was tested for the following markers: CA125 Results of the tumor marker test revealed 9.6       INTERVAL HISTORY: Please see below  for problem oriented charting. She is here with her daughter.  Her daughter serves as interpreter Since her last visit, she complained of fatigue and nausea.  Overall, symptoms are stable. She denies peripheral neuropathy  REVIEW OF SYSTEMS:   Constitutional: Denies fevers, chills or abnormal weight loss Eyes: Denies blurriness of vision Ears, nose, mouth, throat, and face: Denies mucositis or sore throat Respiratory: Denies cough, dyspnea or wheezes Cardiovascular: Denies palpitation, chest discomfort or lower extremity swelling Skin: Denies abnormal skin rashes Lymphatics: Denies new lymphadenopathy or easy bruising Neurological:Denies numbness, tingling or new weaknesses Behavioral/Psych: Mood is stable, no new changes  All other systems were reviewed with the patient and are negative.  I have reviewed the past medical history, past surgical history, social history and family history with the patient and they are unchanged from previous note.  ALLERGIES:  has No Known Allergies.  MEDICATIONS:  Current Outpatient Prescriptions  Medication Sig Dispense Refill  . acetaminophen (TYLENOL) 325 MG tablet Take 650 mg by mouth every 5 (five) hours as needed for moderate pain.    Marland Kitchen alum & mag hydroxide-simeth (MAALOX/MYLANTA) 200-200-20 MG/5ML suspension Take 60 mLs by mouth every 4 (four) hours as needed for indigestion. (Patient not taking: Reported on 06/19/2016) 355 mL 0  . Glycerin, Adult, 2.1 g SUPP Place 1 suppository rectally as needed for moderate constipation. (Patient not taking: Reported on 06/19/2016) 10 suppository 0  . lactulose (CHRONULAC) 10 GM/15ML solution 10 ml three times a day by mouth (Patient not taking: Reported on 11/07/2016) 240 mL 0  . lidocaine-prilocaine (EMLA) cream Apply to port 1 hour before access with needle. Cover with plastic wrap. (Patient not taking: Reported on 11/07/2016) 30 g 11  . magnesium 30 MG tablet Take 30 mg by mouth daily.     . mirtazapine (REMERON)  15 MG tablet Take 1 tablet (15 mg total) by mouth at bedtime. (Patient not taking: Reported on 11/07/2016) 30 tablet 9  . ondansetron (ZOFRAN ODT) 8 MG disintegrating tablet Take 1 tablet (8 mg total) by mouth every 8 (eight) hours as needed for nausea or vomiting. (Patient not taking: Reported on 11/07/2016) 30 tablet 0  . oxyCODONE (OXY IR/ROXICODONE) 5 MG immediate release tablet Take 1 tablet (5 mg total) by mouth every 4 (four) hours as needed for severe pain. (Patient not taking: Reported on 11/07/2016) 60 tablet 0  . oxyCODONE-acetaminophen (PERCOCET/ROXICET) 5-325 MG tablet Take 1 tablet by mouth every 4 (four) hours as needed for moderate pain or severe pain. (Patient not taking: Reported on 11/07/2016) 10 tablet 0  . pantoprazole (PROTONIX) 40 MG tablet Take 1 tablet (40 mg total) by mouth daily. (Patient not taking: Reported on 11/07/2016) 30 tablet 1  . polyethylene glycol (MIRALAX) packet Take 17 g by mouth daily. (Patient not taking: Reported on 11/07/2016) 14 each 9  . Potassium 75 MG TABS Take 75 mg by mouth daily.    . promethazine (PHENERGAN) 25 MG tablet Take 1 tablet (25 mg total) by mouth every 6 (six) hours as  needed for nausea or vomiting. (Patient not taking: Reported on 11/07/2016) 30 tablet 9  . protein supplement (UNJURY CHICKEN SOUP) POWD Take 27 g (8 oz total) by mouth 2 (two) times daily with a meal. (Patient not taking: Reported on 11/07/2016) 27 g 0  . senna-docusate (SENOKOT-S) 8.6-50 MG tablet Take 2 tablets by mouth 2 (two) times daily. (Patient not taking: Reported on 11/07/2016) 60 tablet 9  . simethicone (GAS-X) 80 MG chewable tablet Chew 160 mg by mouth every 6 (six) hours as needed for flatulence.     No current facility-administered medications for this visit.     PHYSICAL EXAMINATION: ECOG PERFORMANCE STATUS: 1 - Symptomatic but completely ambulatory  Vitals:   11/07/16 1207  BP: 111/63  Pulse: 85  Resp: 18  Temp: 98.4 F (36.9 C)   Filed Weights   11/07/16 1207   Weight: 157 lb 3.2 oz (71.3 kg)    GENERAL:alert, no distress and comfortable SKIN: skin color, texture, turgor are normal, no rashes or significant lesions EYES: normal, Conjunctiva are pink and non-injected, sclera clear OROPHARYNX:no exudate, no erythema and lips, buccal mucosa, and tongue normal  NECK: supple, thyroid normal size, non-tender, without nodularity LYMPH:  no palpable lymphadenopathy in the cervical, axillary or inguinal LUNGS: clear to auscultation and percussion with normal breathing effort HEART: regular rate & rhythm and no murmurs and no lower extremity edema ABDOMEN:abdomen soft, non-tender and normal bowel sounds Musculoskeletal:no cyanosis of digits and no clubbing  NEURO: alert & oriented x 3 with fluent speech, no focal motor/sensory deficits  LABORATORY DATA:  I have reviewed the data as listed    Component Value Date/Time   NA 139 11/07/2016 1231   K 4.8 11/07/2016 1231   CL 100 (L) 09/20/2016 1252   CO2 26 11/07/2016 1231   GLUCOSE 112 11/07/2016 1231   BUN 14.1 11/07/2016 1231   CREATININE 0.8 11/07/2016 1231   CALCIUM 9.4 11/07/2016 1231   PROT 7.1 11/07/2016 1231   ALBUMIN 3.8 11/07/2016 1231   AST 17 11/07/2016 1231   ALT 15 11/07/2016 1231   ALKPHOS 91 11/07/2016 1231   BILITOT 0.22 11/07/2016 1231   GFRNONAA >60 09/20/2016 1252   GFRAA >60 09/20/2016 1252    No results found for: SPEP, UPEP  Lab Results  Component Value Date   WBC 4.0 11/07/2016   NEUTROABS 1.6 11/07/2016   HGB 9.8 (L) 11/07/2016   HCT 30.5 (L) 11/07/2016   MCV 90.8 11/07/2016   PLT 284 11/07/2016      Chemistry      Component Value Date/Time   NA 139 11/07/2016 1231   K 4.8 11/07/2016 1231   CL 100 (L) 09/20/2016 1252   CO2 26 11/07/2016 1231   BUN 14.1 11/07/2016 1231   CREATININE 0.8 11/07/2016 1231      Component Value Date/Time   CALCIUM 9.4 11/07/2016 1231   ALKPHOS 91 11/07/2016 1231   AST 17 11/07/2016 1231   ALT 15 11/07/2016 1231   BILITOT  0.22 11/07/2016 1231       RADIOGRAPHIC STUDIES: I have personally reviewed the radiological images as listed and agreed with the findings in the report. Ct Abdomen Pelvis W Contrast  Result Date: 11/03/2016 CLINICAL DATA:  Patient with history of ovarian carcinoma on chemotherapy. Assess response. EXAM: CT ABDOMEN AND PELVIS WITH CONTRAST TECHNIQUE: Multidetector CT imaging of the abdomen and pelvis was performed using the standard protocol following bolus administration of intravenous contrast. CONTRAST:  133mL ISOVUE-300 IOPAMIDOL (ISOVUE-300)  INJECTION 61% COMPARISON:  CT CAP 08/18/2016 FINDINGS: Lower chest: Normal heart size. Dependent atelectasis within the bilateral lower lobes. No pleural effusion. Hepatobiliary: Liver is normal in size and contour. Gallbladder is unremarkable. No focal hepatic lesion is identified. Pancreas: Unremarkable Spleen: Unremarkable Adrenals/Urinary Tract: The adrenal glands are normal. Kidneys enhance symmetrically with contrast. Stable subcentimeter bilateral low-attenuation renal lesions. No hydronephrosis. Urinary bladder is decompressed. Stomach/Bowel: Descending colonic diverticulosis. No CT evidence for acute diverticulitis. No evidence for bowel obstruction. Normal morphology of the stomach. Vascular/Lymphatic: Normal caliber abdominal aorta. Peripheral calcified atherosclerotic plaque. Circumaortic left renal vein. No retroperitoneal lymphadenopathy. Reproductive: Status posthysterectomy. Other: Interval decrease in omental and peritoneal nodularity when compared to prior exam. Within the central small bowel mesentery there is a 7.1 x 2.8 cm area of soft tissue nodularity, previously measuring up to 16.9 x 7.0 cm. Previously described nodularity within the pelvis is no longer visualized. No definite residual nodularity along the hepatic dome. Interval removal of peritoneal drainage catheter. No significant residual ascites. Musculoskeletal: No aggressive or acute  appearing osseous lesions. IMPRESSION: Interval decrease in peritoneal metastatic disease when compared with prior exam. Aortic atherosclerosis. Sigmoid colonic diverticulosis. Electronically Signed   By: Lovey Newcomer M.D.   On: 11/03/2016 16:03    ASSESSMENT & PLAN:  Ovarian carcinoma ([place] [place]) She tolerated treatment well except for fatigue and nausea We will proceed with treatment without dose adjustment I have adjusted her antiemetics CT scan show excellent response to treatment. I recommend we continue minimum 6 cycles of treatment.  I would consider adding Avastin to future treatment but would prefer to hold off due to concern for possible bowel obstruction due to location of the disease  Anemia due to antineoplastic chemotherapy This is likely due to recent treatment. The patient denies recent history of bleeding such as epistaxis, hematuria or hematochezia. She is asymptomatic from the anemia. I will observe for now.  She does not require transfusion now. I will continue the chemotherapy at current dose without dosage adjustment.  If the anemia gets progressive worse in the future, I might have to delay her treatment or adjust the chemotherapy dose.   Chemotherapy-induced nausea She had recent severe nausea and vomiting. We review anti-emetics regimen. I have recently prescribed additional antiemetics and so far her nausea is stable   No orders of the defined types were placed in this encounter.  All questions were answered. The patient knows to call the clinic with any problems, questions or concerns. No barriers to learning was detected. I spent 15 minutes counseling the patient face to face. The total time spent in the appointment was 20 minutes and more than 50% was on counseling and review of test results     Heath Lark, MD 11/08/2016 9:11 AM

## 2016-11-14 ENCOUNTER — Ambulatory Visit (HOSPITAL_BASED_OUTPATIENT_CLINIC_OR_DEPARTMENT_OTHER): Payer: Self-pay

## 2016-11-14 ENCOUNTER — Other Ambulatory Visit: Payer: Self-pay

## 2016-11-14 ENCOUNTER — Ambulatory Visit: Payer: Self-pay

## 2016-11-14 ENCOUNTER — Other Ambulatory Visit (HOSPITAL_BASED_OUTPATIENT_CLINIC_OR_DEPARTMENT_OTHER): Payer: Self-pay

## 2016-11-14 DIAGNOSIS — Z95828 Presence of other vascular implants and grafts: Secondary | ICD-10-CM

## 2016-11-14 DIAGNOSIS — C569 Malignant neoplasm of unspecified ovary: Secondary | ICD-10-CM

## 2016-11-14 DIAGNOSIS — C786 Secondary malignant neoplasm of retroperitoneum and peritoneum: Secondary | ICD-10-CM

## 2016-11-14 LAB — COMPREHENSIVE METABOLIC PANEL
ALT: 35 U/L (ref 0–55)
ANION GAP: 9 meq/L (ref 3–11)
AST: 27 U/L (ref 5–34)
Albumin: 3.6 g/dL (ref 3.5–5.0)
Alkaline Phosphatase: 93 U/L (ref 40–150)
BILIRUBIN TOTAL: 0.39 mg/dL (ref 0.20–1.20)
BUN: 13.3 mg/dL (ref 7.0–26.0)
CO2: 27 meq/L (ref 22–29)
Calcium: 9.4 mg/dL (ref 8.4–10.4)
Chloride: 102 mEq/L (ref 98–109)
Creatinine: 0.7 mg/dL (ref 0.6–1.1)
EGFR: 78 mL/min/{1.73_m2} — AB (ref >90)
Glucose: 108 mg/dl (ref 70–140)
POTASSIUM: 4.8 meq/L (ref 3.5–5.1)
SODIUM: 138 meq/L (ref 136–145)
TOTAL PROTEIN: 6.9 g/dL (ref 6.4–8.3)

## 2016-11-14 LAB — CBC WITH DIFFERENTIAL/PLATELET
BASO%: 0.8 % (ref 0.0–2.0)
BASOS ABS: 0 10*3/uL (ref 0.0–0.1)
EOS ABS: 0 10*3/uL (ref 0.0–0.5)
EOS%: 0 % (ref 0.0–7.0)
HCT: 28.8 % — ABNORMAL LOW (ref 34.8–46.6)
HGB: 9.3 g/dL — ABNORMAL LOW (ref 11.6–15.9)
LYMPH%: 59.4 % — AB (ref 14.0–49.7)
MCH: 29.2 pg (ref 25.1–34.0)
MCHC: 32.3 g/dL (ref 31.5–36.0)
MCV: 90.6 fL (ref 79.5–101.0)
MONO#: 0.1 10*3/uL (ref 0.1–0.9)
MONO%: 3.8 % (ref 0.0–14.0)
NEUT#: 0.9 10*3/uL — ABNORMAL LOW (ref 1.5–6.5)
NEUT%: 36 % — AB (ref 38.4–76.8)
NRBC: 0 % (ref 0–0)
PLATELETS: 296 10*3/uL (ref 145–400)
RBC: 3.18 10*6/uL — AB (ref 3.70–5.45)
RDW: 21.6 % — ABNORMAL HIGH (ref 11.2–14.5)
WBC: 2.6 10*3/uL — ABNORMAL LOW (ref 3.9–10.3)
lymph#: 1.6 10*3/uL (ref 0.9–3.3)

## 2016-11-14 MED ORDER — LACTULOSE 10 GM/15ML PO SOLN: ORAL | 0 refills | Status: DC

## 2016-11-14 MED ORDER — SODIUM CHLORIDE 0.9% FLUSH
10.0000 mL | Freq: Once | INTRAVENOUS | Status: AC
  Administered 2016-11-14: 10 mL
  Filled 2016-11-14: qty 10

## 2016-11-14 MED FILL — LACTULOSE 10 GM/15 ML SOLN: 10 | 8 days supply | Qty: 240 | Fill #0

## 2016-11-14 NOTE — Progress Notes (Signed)
No tx today per Dr. Alvy Bimler due to low ANC. Patient educated on Neutropenic precautions and masks provided. Discharge instructions include neutropenic precautions and were printed in [facility]. Patient de accessed by Emeterio Reeve

## 2016-11-14 NOTE — Patient Instructions (Signed)
Neutropenia  (Neutropenia)  La neutropenia es una afección que surge cuando el nivel de un tipo de glóbulos blancos (neutrófilos) en el cuerpo es inferior al normal. Los neutrófilos, que se producen en el centro esponjoso de los huesos grandes (médula ósea), combaten las infecciones.  Los neutrófilos son la defensa principal del cuerpo contra las infecciones por bacterias y hongos. Cuanto más bajo sea el nivel de neutrófilos y cuanto más tiempo el cuerpo se quede sin ellos, más riesgos hay de sufrir una infección grave.  CAUSAS  Esta afección puede aparecer cuando el cuerpo utiliza o destruye neutrófilos más rápido de lo que la médula ósea puede producirlos. Este problema puede ocurrir debido a lo siguiente:  · Infecciones por bacterias u hongos.  · Trastornos alérgicos.  · Reacciones a algunos medicamentos.  · Enfermedad autoinmunitaria.  · Agrandamiento del bazo.  Esta afección también puede surgir si la médula ósea no produce la cantidad suficiente de neutrófilos. Este problema puede deberse a lo siguiente:  · Cáncer.  · Tratamientos contra el cáncer, como radioterapia o quimioterapia.  · Infecciones virales.  · Medicamentos, como fenitoína.  · Déficit de vitamina B12.  · Enfermedades de la médula ósea.  · Toxinas ambientales, como insecticidas.  SÍNTOMAS  Por lo general, esta afección no causa síntomas. Si aparecen síntomas, estos suelen deberse a una infección preexistente. Entre los síntomas de una infección, se incluyen los siguientes:  · Fiebre.  · Escalofríos.  · Ganglios inflamados.  · Úlceras orales o anales.  · Tos y falta de aire.  · Erupción cutánea.  · Infección en la piel.  · Fatiga.  DIAGNÓSTICO  El médico puede sospechar la presencia de neutropenia si usted tiene lo siguiente:  · Una afección que cause neutropenia.  · Síntomas de infección, especialmente, fiebre.  · Infecciones frecuentes y poco comunes.  Se le hará un examen físico y se registrarán los antecedentes médicos. También se le  realizarán estudios, por ejemplo:  · Un hemograma completo.  · Un procedimiento para extraer una muestra de médula ósea y luego examinarla (biopsia de médula ósea).  · Radiografía de tórax.  · Un cultivo de orina.  · Un hemocultivo.  TRATAMIENTO  El tratamiento depende de la causa preexistente y de la gravedad de la afección. La neutropenia leve puede no requerir tratamiento. El tratamiento puede incluir medicamentos, por ejemplo:  · Administración de antibióticos a través de una vía intravenosa (IV).  · Medicamentos antivirales.  · Medicamentos antimicóticos.  · Un medicamento para aumentar la producción de neutrófilos (factor estimulante de colonias). Este medicamento se puede administrar por una vía intravenosa (IV) o con una inyección.  · Corticoides a través de una vía intravenosa (IV).  Si la causa de la neutropenia es una afección preexistente, puede requerir tratamiento para esa afección. Si los medicamentos que está tomando le causan neutropenia, es posible que el médico le indique que deje de tomarlos.  INSTRUCCIONES PARA EL CUIDADO EN EL HOGAR  Medicamentos  · Tome los medicamentos de venta libre y los recetados solamente como se lo haya indicado el médico.  · Colóquese la vacuna contra la gripe estacional.  Estilo de vida  · No coma productos no pasteurizados.No coma frutas ni vegetales crudos sin lavar.  · Evite exponerse a grupos de personas o niños.  · Evite estar cerca de personas enfermas.  · Evite estar cerca de la suciedad o del polvo, por ejemplo, en áreas de construcción o jardines.  · No cuide directamente a sus   mascotas. Evite los excrementos de los animales. No limpie las cajas de arena y las jaulas para pájaros.  Higiene  · Báñese todos los días.  · Limpie el área entre los genitales y el ano (área perineal) después de orinar o defecar. Si es mujer, límpiese desde adelante hacia atrás.  · Cepíllese los dientes con un cepillo de cerdas suaves antes y después de las comidas.  · No use una  navaja con hoja para rasurarse. Use una rasuradora eléctrica para depilarse.  · Lávese las manos con frecuencia. Asegúrese de los que están en contacto con usted también se laven las manos. Use desinfectante para manos si no dispone de agua y jabón.  Instrucciones generales  · No tenga relaciones sexuales si el médico no lo ha autorizado.  · Tome medidas para evitar los cortes y las quemaduras. Por ejemplo:  ? Tenga cuidado al usar cuchillos. Siempre corte en dirección contraria a su cuerpo.  ? Mantenga los cuchillos en fundas o cubiertas protectoras cuando no los use.  ? Use guantes para horno al cocinar en el horno, la cocina o la parrilla.  ? Aléjese del fuego.  · Evite el contacto con personas que recibieron una vacuna en los últimos 30 días si esa vacuna contenía virus vivos. No debe recibir una vacuna con virus vivos. Las vacunas con virus vivos más frecuentes son las vacunas contra la varicela, el sarampión, las paperas y la rubéola.  · No comparta utensilios personales.  · No use tampones, enemas o supositorios rectales excepto si lo ha autorizado su médico.  · Concurra a todas las visitas como se lo haya indicado el médico. Esto es importante.  SOLICITE ATENCIÓN MÉDICA SI:  · Tiene fiebre.  · Siente escalofríos o comienza a temblar.  · Tiene los siguientes síntomas:  ? Dolor de garganta.  ? Una zona de piel que está caliente, enrojecida o dolorosa a la palpación.  ? Tos.  ? Orina frecuente o dolor al orinar.  ? Flujo o picazón vaginal.  · Tiene los siguientes signos y síntomas:  ? Llagas en la boca o en el ano.  ? Ganglios linfáticos hinchados.  ? Líneas rojas en la piel.  ? Erupción cutánea.  · Presenta lo siguiente:  ? Tiene náuseas o vomita.  ? Siente mucha fatiga.  ? Le falta el aire.  Esta información no tiene como fin reemplazar el consejo del médico. Asegúrese de hacerle al médico cualquier pregunta que tenga.  Document Released: 03/01/2005 Document Revised: 09/13/2015 Document Reviewed:  12/02/2014  Elsevier Interactive Patient Education © 2018 [employer]

## 2016-11-28 ENCOUNTER — Ambulatory Visit (HOSPITAL_BASED_OUTPATIENT_CLINIC_OR_DEPARTMENT_OTHER): Payer: Self-pay | Admitting: Hematology and Oncology

## 2016-11-28 ENCOUNTER — Other Ambulatory Visit (HOSPITAL_BASED_OUTPATIENT_CLINIC_OR_DEPARTMENT_OTHER): Payer: Self-pay

## 2016-11-28 ENCOUNTER — Ambulatory Visit (HOSPITAL_BASED_OUTPATIENT_CLINIC_OR_DEPARTMENT_OTHER): Payer: Self-pay

## 2016-11-28 ENCOUNTER — Ambulatory Visit: Payer: Self-pay

## 2016-11-28 ENCOUNTER — Encounter: Payer: Self-pay | Admitting: Hematology and Oncology

## 2016-11-28 DIAGNOSIS — Z5111 Encounter for antineoplastic chemotherapy: Secondary | ICD-10-CM

## 2016-11-28 DIAGNOSIS — C801 Malignant (primary) neoplasm, unspecified: Secondary | ICD-10-CM

## 2016-11-28 DIAGNOSIS — Z95828 Presence of other vascular implants and grafts: Secondary | ICD-10-CM

## 2016-11-28 DIAGNOSIS — C569 Malignant neoplasm of unspecified ovary: Secondary | ICD-10-CM

## 2016-11-28 DIAGNOSIS — D61818 Other pancytopenia: Secondary | ICD-10-CM

## 2016-11-28 DIAGNOSIS — R140 Abdominal distension (gaseous): Secondary | ICD-10-CM

## 2016-11-28 DIAGNOSIS — C786 Secondary malignant neoplasm of retroperitoneum and peritoneum: Secondary | ICD-10-CM

## 2016-11-28 LAB — CBC WITH DIFFERENTIAL/PLATELET
BASO%: 0.5 % (ref 0.0–2.0)
Basophils Absolute: 0 10*3/uL (ref 0.0–0.1)
EOS%: 0.2 % (ref 0.0–7.0)
Eosinophils Absolute: 0 10*3/uL (ref 0.0–0.5)
HEMATOCRIT: 30.5 % — AB (ref 34.8–46.6)
HEMOGLOBIN: 9.8 g/dL — AB (ref 11.6–15.9)
LYMPH#: 1.8 10*3/uL (ref 0.9–3.3)
LYMPH%: 41.8 % (ref 14.0–49.7)
MCH: 30 pg (ref 25.1–34.0)
MCHC: 32.1 g/dL (ref 31.5–36.0)
MCV: 93.3 fL (ref 79.5–101.0)
MONO#: 0.8 10*3/uL (ref 0.1–0.9)
MONO%: 17.8 % — ABNORMAL HIGH (ref 0.0–14.0)
NEUT#: 1.7 10*3/uL (ref 1.5–6.5)
NEUT%: 39.7 % (ref 38.4–76.8)
NRBC: 1 % — AB (ref 0–0)
Platelets: 253 10*3/uL (ref 145–400)
RBC: 3.27 10*6/uL — ABNORMAL LOW (ref 3.70–5.45)
RDW: 22.9 % — AB (ref 11.2–14.5)
WBC: 4.2 10*3/uL (ref 3.9–10.3)

## 2016-11-28 LAB — COMPREHENSIVE METABOLIC PANEL
ALBUMIN: 3.6 g/dL (ref 3.5–5.0)
ALK PHOS: 94 U/L (ref 40–150)
ALT: 15 U/L (ref 0–55)
AST: 16 U/L (ref 5–34)
Anion Gap: 9 mEq/L (ref 3–11)
BILIRUBIN TOTAL: 0.29 mg/dL (ref 0.20–1.20)
BUN: 18.1 mg/dL (ref 7.0–26.0)
CO2: 26 mEq/L (ref 22–29)
Calcium: 9.3 mg/dL (ref 8.4–10.4)
Chloride: 101 mEq/L (ref 98–109)
Creatinine: 0.8 mg/dL (ref 0.6–1.1)
EGFR: 69 mL/min/{1.73_m2} — ABNORMAL LOW (ref >90)
Glucose: 118 mg/dl (ref 70–140)
Potassium: 4 mEq/L (ref 3.5–5.1)
SODIUM: 136 meq/L (ref 136–145)
TOTAL PROTEIN: 7 g/dL (ref 6.4–8.3)

## 2016-11-28 MED ORDER — SODIUM CHLORIDE 0.9% FLUSH
10.0000 mL | INTRAVENOUS | Status: DC | PRN
  Administered 2016-11-28: 10 mL via INTRAVENOUS
  Filled 2016-11-28: qty 10

## 2016-11-28 MED ORDER — CARBOPLATIN CHEMO INTRADERMAL TEST DOSE 100MCG/0.02ML
100.0000 ug | Freq: Once | INTRADERMAL | Status: AC
  Administered 2016-11-28: 100 ug via INTRADERMAL
  Filled 2016-11-28: qty 0.02

## 2016-11-28 MED ORDER — SODIUM CHLORIDE 0.9% FLUSH
10.0000 mL | INTRAVENOUS | Status: DC | PRN
  Administered 2016-11-28 (×2): 10 mL
  Filled 2016-11-28: qty 10

## 2016-11-28 MED ORDER — CARBOPLATIN CHEMO INJECTION 600 MG/60ML
403.5000 mg | Freq: Once | INTRAVENOUS | Status: AC
  Administered 2016-11-28: 400 mg via INTRAVENOUS
  Filled 2016-11-28: qty 40

## 2016-11-28 MED ORDER — PALONOSETRON HCL INJECTION 0.25 MG/5ML
INTRAVENOUS | Status: AC
  Filled 2016-11-28: qty 5

## 2016-11-28 MED ORDER — SODIUM CHLORIDE 0.9 % IV SOLN
640.0000 mg/m2 | Freq: Once | INTRAVENOUS | Status: AC
  Administered 2016-11-28: 1140 mg via INTRAVENOUS
  Filled 2016-11-28: qty 29.98

## 2016-11-28 MED ORDER — HEPARIN SOD (PORK) LOCK FLUSH 100 UNIT/ML IV SOLN
500.0000 [IU] | Freq: Once | INTRAVENOUS | Status: AC | PRN
  Administered 2016-11-28: 500 [IU]
  Filled 2016-11-28: qty 5

## 2016-11-28 MED ORDER — PALONOSETRON HCL INJECTION 0.25 MG/5ML
0.2500 mg | Freq: Once | INTRAVENOUS | Status: AC
  Administered 2016-11-28: 0.25 mg via INTRAVENOUS

## 2016-11-28 MED ORDER — SODIUM CHLORIDE 0.9 % IV SOLN
Freq: Once | INTRAVENOUS | Status: AC
  Administered 2016-11-28: 14:00:00 via INTRAVENOUS
  Filled 2016-11-28: qty 5

## 2016-11-28 MED ORDER — SODIUM CHLORIDE 0.9 % IV SOLN
Freq: Once | INTRAVENOUS | Status: AC
  Administered 2016-11-28: 13:00:00 via INTRAVENOUS

## 2016-11-28 NOTE — Assessment & Plan Note (Signed)
She had mild pancytopenia from recent treatment She is not symptomatic Due to recent delay of treatment from pancytopenia, I would reduce a dose of Gemzar a little bit

## 2016-11-28 NOTE — Patient Instructions (Signed)
[place] Discharge Instructions for Patients Receiving Chemotherapy  Today you received the following chemotherapy agents Carboplatin/ Gemzar  To help prevent nausea and vomiting after your treatment, we encourage you to take your nausea medication as needed   If you develop nausea and vomiting that is not controlled by your nausea medication, call the clinic.   BELOW ARE SYMPTOMS THAT SHOULD BE REPORTED IMMEDIATELY:  *FEVER GREATER THAN 100.5 F  *CHILLS WITH OR WITHOUT FEVER  NAUSEA AND VOMITING THAT IS NOT CONTROLLED WITH YOUR NAUSEA MEDICATION  *UNUSUAL SHORTNESS OF BREATH  *UNUSUAL BRUISING OR BLEEDING  TENDERNESS IN MOUTH AND THROAT WITH OR WITHOUT PRESENCE OF ULCERS  *URINARY PROBLEMS  *BOWEL PROBLEMS  UNUSUAL RASH Items with * indicate a potential emergency and should be followed up as soon as possible.  Feel free to call the clinic you have any questions or concerns. The clinic phone number is [phone].  Please show the [place] at check-in to the Emergency Department and triage nurse.

## 2016-11-28 NOTE — Progress Notes (Signed)
[place] OFFICE PROGRESS NOTE  Patient Care Team: Patient, No Pcp Per as PCP - General (General Practice)  SUMMARY OF ONCOLOGIC HISTORY:   Ovarian carcinoma ([place])   04/19/2015 Initial Diagnosis    In late 03-2015 she in [place] and was found to have large pelvic mass and perihepatic ascites. CA 125 then was 359, with CEA of 3, CA 19-9 5.8 and alpha fetoprotein 2.3. She had exploratory laparotomy 04-19-15, not done by gyn oncologist, with bilateral ovaried an tumors, 10 cm with capsule ruptured on right , smaller on left, peritoneal carcinomatosis.       05/20/2015 Miscellaneous    She had some bowel obstruction post operatively, with NG used . Pathology reportedcystic and solid left ovarian mass, 13 cm right ovarian mass, moderately differentiated tumor "tumor de celulas de la granulosa". 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 CT AP 05-2015 had ascites, carcinomatosis and multiple peritoneal implants. CA 125 on 05-24-15 baseline for chemo was 141. She received 3 cycles of taxol + carboplatin from 05-26-15 thru 07-07-15 (not clear if avastin given those cycles), after which carboplatin was not available in [place]. She received taxol 175 mg/m2, CDDP and avastin for 3 additional cycles thru 10-07-15. Second look laparotomy was discussed, patient declined, and instead had 4 cycles avastin + taxol from 12-01-15 thru 02-02-16 (apparentlyno CDDP after 10-07-15 and no carboplatin after 07-07-15). CT CAP 02-24-16 reportedly had no evidence of malignancy, with some bullous emphysema, tortuous sigmoid colon with diverticulosis. . Recommendation was for maintenance avastin x 1 year, however patient then came to  to be with family.       05/20/2016 Imaging    Extensive peritoneal disease with omental thickening/ 18 and large volume ascites. 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  Trace bilateral pleural effusions. Moderate hiatal hernia. Bilateral inguinal hernias containing fat. Left colonic diverticulosis. Aortoiliac atherosclerosis.      05/20/2016 - 06/13/2016 Hospital Admission    76 year old female with a history of metastatic ovarian cancer diagnosed in January 2017 (s/p primary debulking surgery followed by 8 adjuvant cycles of carboplatin and paclitaxel and 4 additional cycles of carboplatin, paclitaxel, avastin), last chemo Sept 2017, presented with progressive abdominal bloating and pain for 3 weeks duration. She was feeling well until the first week of December, 2017 when she was sick with a virus from which she never recovered. She states that she began feeling distended in the abdomen and uncomfortable and was taken to the ED on 05/20/16. CT chest/abd/pelvis9-21-17 reportedly with no evidence of malignancy, with some bullous emphysema, tortuous sigmoid colon with diverticulosis. Recommendation was for maintenance avastin x 1 year, however patient then came to [facility] in 02-2016. She has not established care with any physician since coming to [place]. Unfortunately, after paracentesis she began having increasing abdominal pain. Abdominal x-ray revealed distended small bowel loops concerning for small bowel obstruction. Gen. surgery was consulted. NG was inserted to suction. Because of prolonged Hospitalization she was placed on TPN/TNA. She underwent Chemotheray with Doxil on 12/26. Repeat U/S 12/31 showed interval progression of ascites and She is s/p repeat Paracentesis 06-06-16 which removed 4 liters. She was subsequently discharged      05/21/2016 Tumor Marker    Patient's tumor was tested for the following markers: CA125 Results of the tumor marker test revealed 249.8      05/22/2016 Procedure    Successful ultrasound-guided paracentesis yielding 2.9 liters of peritoneal fluid.  05/22/2016 Pathology Results    PERITONEAL/ASCITIC FLUID, 1 OF 1 COLLECTED ON  05/22/16: ADENOCARCINOMA. ADDENDUM: Immunohistochemistry is performed on the cell block and the tumor is strongly positive with cytokeratin 7 and cytokeratin 8/18 and shows patchy positivity with estrogen receptor, cytokeratin 5/6, WT-1 and MOC-31 with focal weak positivity with PAX-8. The tumor is negative with thyroglobulin, thyroid transcription factor-1, Napsin-A, progesterone receptor, GATA 3, gross cystic disease fluid protein, cytokeratin 20, CDX-2 and Calretinin. 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       05/24/2016 Procedure    Ultrasound and fluoroscopically guided right internal jugular single lumen power port catheter insertion. Tip in the SVC/RA junction. Catheter ready for use      05/24/2016 Imaging    LV EF: 65% -   70%      05/28/2016 Imaging    Small volume of abdominal and pelvic ascites is identified and inadequate for paracentesis.      05/30/2016 - 08/21/2016 Chemotherapy    She received doxorubicin x 3 cycles, stopped due to disease progression      06/04/2016 Imaging    Increased amount of ascites, freely distributed in all 4 quadrants. No other acute finding by ultrasound.      06/06/2016 Imaging    Small left greater than right pleural effusions, increased compared to prior CT. Interim development of partial consolidations within the bilateral lower lobes which may reflect atelectasis or pneumonia. Few scattered foci of ground-glass density within the right upper and left upper lobes, suspect small foci of inflammation or infection. 2. Moderate to large volume of ascites in the upper abdomen with extensive mesenteric and omental metastatic disease partially visualized. 3. Dilated gallbladder.      06/08/2016 Procedure    Successful ultrasound and fluoroscopic guided placement of a tunneled peritoneal drainage catheter.  2. Successful aspiration of 900 cc of serous ascites following tunneled peritoneal drainage catheter placement.      06/19/2016 Tumor Marker    Patient's tumor was tested for the following markers: CA125 Results of the tumor marker test revealed 166.6      07/24/2016 Tumor Marker    Patient's tumor was tested for the following markers: CA125 Results of the tumor marker test revealed 104.4      08/18/2016 Imaging    Extensive peritoneal metastatic disease within the abdomen. This is somewhat difficult to compare with prior given diffuse nature however there is suggestion of interval progression. Interval resolution of previously described bilateral pleural effusions. Peritoneal drainage catheter is present with tip terminating in the right upper quadrant. Interval decrease in now small volume ascites. Aortic atherosclerosis. Diverticulosis without CT evidence for acute diverticulitis.      08/18/2016 Imaging    LV EF: 60% -  65%      08/21/2016 Tumor Marker    Patient's tumor was tested for the following markers: CA125 Results of the tumor marker test revealed 96.1      08/29/2016 -  Chemotherapy    She received gemzar and carboplatin      09/05/2016 Tumor Marker    Patient's tumor was tested for the following markers: CA125 Results of the tumor marker test revealed 48.1      09/20/2016 Procedure    Status post bedside removal of tunneled right-sided peritoneal drain which was removed in its entirety.      10/03/2016 Tumor Marker    Patient's tumor  was tested for the following markers: CA125 Results of the tumor marker test revealed 22.9      11/03/2016 Imaging    Interval decrease in peritoneal metastatic disease when compared with prior exam. Aortic atherosclerosis. Sigmoid colonic diverticulosis.      11/07/2016 Tumor Marker    Patient's tumor was tested for the following markers: CA125 Results of the tumor marker test revealed 9.6       INTERVAL HISTORY: Please see below  for problem oriented charting. She returns for further follow-up Her recent chemotherapy was admitted due to severe pancytopenia She denies recent fever or chills Denies nausea or mucositis.  Her only complaint is intermittent abdominal bloating She denies signs or symptoms of bowel obstruction such as severe constipation The patient denies any recent signs or symptoms of bleeding such as spontaneous epistaxis, hematuria or hematochezia.  REVIEW OF SYSTEMS:   Constitutional: Denies fevers, chills or abnormal weight loss Eyes: Denies blurriness of vision Ears, nose, mouth, throat, and face: Denies mucositis or sore throat Respiratory: Denies cough, dyspnea or wheezes Cardiovascular: Denies palpitation, chest discomfort or lower extremity swelling Gastrointestinal:  Denies nausea, heartburn or change in bowel habits Skin: Denies abnormal skin rashes Lymphatics: Denies new lymphadenopathy or easy bruising Neurological:Denies numbness, tingling or new weaknesses Behavioral/Psych: Mood is stable, no new changes  All other systems were reviewed with the patient and are negative.  I have reviewed the past medical history, past surgical history, social history and family history with the patient and they are unchanged from previous note.  ALLERGIES:  has No Known Allergies.  MEDICATIONS:  Current Outpatient Prescriptions  Medication Sig Dispense Refill  . acetaminophen (TYLENOL) 325 MG tablet Take 650 mg by mouth every 5 (five) hours as needed for moderate pain.    Marland Kitchen alum & mag hydroxide-simeth (MAALOX/MYLANTA) 200-200-20 MG/5ML suspension Take 60 mLs by mouth every 4 (four) hours as needed for indigestion. (Patient not taking: Reported on 06/19/2016) 355 mL 0  . Glycerin, Adult, 2.1 g SUPP Place 1 suppository rectally as needed for moderate constipation. (Patient not taking: Reported on 06/19/2016) 10 suppository 0  . lactulose (CHRONULAC) 10 GM/15ML solution 10 ml three times a day by mouth  240 mL 0  . lidocaine-prilocaine (EMLA) cream Apply to port 1 hour before access with needle. Cover with plastic wrap. (Patient not taking: Reported on 11/07/2016) 30 g 11  . magnesium 30 MG tablet Take 30 mg by mouth daily.     . mirtazapine (REMERON) 15 MG tablet Take 1 tablet (15 mg total) by mouth at bedtime. (Patient not taking: Reported on 11/07/2016) 30 tablet 9  . ondansetron (ZOFRAN ODT) 8 MG disintegrating tablet Take 1 tablet (8 mg total) by mouth every 8 (eight) hours as needed for nausea or vomiting. (Patient not taking: Reported on 11/07/2016) 30 tablet 0  . oxyCODONE (OXY IR/ROXICODONE) 5 MG immediate release tablet Take 1 tablet (5 mg total) by mouth every 4 (four) hours as needed for severe pain. (Patient not taking: Reported on 11/07/2016) 60 tablet 0  . oxyCODONE-acetaminophen (PERCOCET/ROXICET) 5-325 MG tablet Take 1 tablet by mouth every 4 (four) hours as needed for moderate pain or severe pain. (Patient not taking: Reported on 11/07/2016) 10 tablet 0  . pantoprazole (PROTONIX) 40 MG tablet Take 1 tablet (40 mg total) by mouth daily. (Patient not taking: Reported on 11/07/2016) 30 tablet 1  . polyethylene glycol (MIRALAX) packet Take 17 g by mouth daily. (Patient not taking: Reported on 11/07/2016) 14 each 9  .  Potassium 75 MG TABS Take 75 mg by mouth daily.    . promethazine (PHENERGAN) 25 MG tablet Take 1 tablet (25 mg total) by mouth every 6 (six) hours as needed for nausea or vomiting. (Patient not taking: Reported on 11/07/2016) 30 tablet 9  . protein supplement (UNJURY CHICKEN SOUP) POWD Take 27 g (8 oz total) by mouth 2 (two) times daily with a meal. (Patient not taking: Reported on 11/07/2016) 27 g 0  . senna-docusate (SENOKOT-S) 8.6-50 MG tablet Take 2 tablets by mouth 2 (two) times daily. (Patient not taking: Reported on 11/07/2016) 60 tablet 9  . simethicone (GAS-X) 80 MG chewable tablet Chew 160 mg by mouth every 6 (six) hours as needed for flatulence.     No current facility-administered  medications for this visit.    Facility-Administered Medications Ordered in Other Visits  Medication Dose Route Frequency Provider Last Rate Last Dose  . sodium chloride flush (NS) 0.9 % injection 10 mL  10 mL Intravenous PRN Alvy Bimler, Fredderick Swanger, MD   10 mL at 11/28/16 1200    PHYSICAL EXAMINATION: ECOG PERFORMANCE STATUS: 0 - Asymptomatic  Vitals:   11/28/16 1212  BP: (!) 141/73  Pulse: 82  Resp: 18  Temp: 98.6 F (37 C)   Filed Weights   11/28/16 1212  Weight: 162 lb 6.4 oz (73.7 kg)    GENERAL:alert, no distress and comfortable SKIN: skin color, texture, turgor are normal, no rashes or significant lesions EYES: normal, Conjunctiva are pink and non-injected, sclera clear OROPHARYNX:no exudate, no erythema and lips, buccal mucosa, and tongue normal  NECK: supple, thyroid normal size, non-tender, without nodularity LYMPH:  no palpable lymphadenopathy in the cervical, axillary or inguinal LUNGS: clear to auscultation and percussion with normal breathing effort HEART: regular rate & rhythm and no murmurs and no lower extremity edema ABDOMEN:abdomen soft, non-tender and normal bowel sounds Musculoskeletal:no cyanosis of digits and no clubbing  NEURO: alert & oriented x 3 with fluent speech, no focal motor/sensory deficits  LABORATORY DATA:  I have reviewed the data as listed    Component Value Date/Time   NA 136 11/28/2016 1140   K 4.0 11/28/2016 1140   CL 100 (L) 09/20/2016 1252   CO2 26 11/28/2016 1140   GLUCOSE 118 11/28/2016 1140   BUN 18.1 11/28/2016 1140   CREATININE 0.8 11/28/2016 1140   CALCIUM 9.3 11/28/2016 1140   PROT 7.0 11/28/2016 1140   ALBUMIN 3.6 11/28/2016 1140   AST 16 11/28/2016 1140   ALT 15 11/28/2016 1140   ALKPHOS 94 11/28/2016 1140   BILITOT 0.29 11/28/2016 1140   GFRNONAA >60 09/20/2016 1252   GFRAA >60 09/20/2016 1252    No results found for: SPEP, UPEP  Lab Results  Component Value Date   WBC 4.2 11/28/2016   NEUTROABS 1.7 11/28/2016    HGB 9.8 (L) 11/28/2016   HCT 30.5 (L) 11/28/2016   MCV 93.3 11/28/2016   PLT 253 11/28/2016      Chemistry      Component Value Date/Time   NA 136 11/28/2016 1140   K 4.0 11/28/2016 1140   CL 100 (L) 09/20/2016 1252   CO2 26 11/28/2016 1140   BUN 18.1 11/28/2016 1140   CREATININE 0.8 11/28/2016 1140      Component Value Date/Time   CALCIUM 9.3 11/28/2016 1140   ALKPHOS 94 11/28/2016 1140   AST 16 11/28/2016 1140   ALT 15 11/28/2016 1140   BILITOT 0.29 11/28/2016 1140       RADIOGRAPHIC  STUDIES: I have personally reviewed the radiological images as listed and agreed with the findings in the report. Ct Abdomen Pelvis W Contrast  Result Date: 11/03/2016 CLINICAL DATA:  Patient with history of ovarian carcinoma on chemotherapy. Assess response. EXAM: CT ABDOMEN AND PELVIS WITH CONTRAST TECHNIQUE: Multidetector CT imaging of the abdomen and pelvis was performed using the standard protocol following bolus administration of intravenous contrast. CONTRAST:  118mL ISOVUE-300 IOPAMIDOL (ISOVUE-300) INJECTION 61% COMPARISON:  CT CAP 08/18/2016 FINDINGS: Lower chest: Normal heart size. Dependent atelectasis within the bilateral lower lobes. No pleural effusion. Hepatobiliary: Liver is normal in size and contour. Gallbladder is unremarkable. No focal hepatic lesion is identified. Pancreas: Unremarkable Spleen: Unremarkable Adrenals/Urinary Tract: The adrenal glands are normal. Kidneys enhance symmetrically with contrast. Stable subcentimeter bilateral low-attenuation renal lesions. No hydronephrosis. Urinary bladder is decompressed. Stomach/Bowel: Descending colonic diverticulosis. No CT evidence for acute diverticulitis. No evidence for bowel obstruction. Normal morphology of the stomach. Vascular/Lymphatic: Normal caliber abdominal aorta. Peripheral calcified atherosclerotic plaque. Circumaortic left renal vein. No retroperitoneal lymphadenopathy. Reproductive: Status posthysterectomy. Other:  Interval decrease in omental and peritoneal nodularity when compared to prior exam. Within the central small bowel mesentery there is a 7.1 x 2.8 cm area of soft tissue nodularity, previously measuring up to 16.9 x 7.0 cm. Previously described nodularity within the pelvis is no longer visualized. No definite residual nodularity along the hepatic dome. Interval removal of peritoneal drainage catheter. No significant residual ascites. Musculoskeletal: No aggressive or acute appearing osseous lesions. IMPRESSION: Interval decrease in peritoneal metastatic disease when compared with prior exam. Aortic atherosclerosis. Sigmoid colonic diverticulosis. Electronically Signed   By: Lovey Newcomer M.D.   On: 11/03/2016 16:03    ASSESSMENT & PLAN:  Ovarian carcinoma ([place]) She tolerated treatment well except for fatigue and nausea We will proceed with treatment with minor dose adjustment to Gemzar due to recent pancytopenia and delay of treatment I have adjusted her antiemetics CT scan show excellent response to treatment. I recommend we continue minimum 6 cycles of treatment.  I would consider adding Avastin to future treatment but would prefer to hold off due to concern for possible bowel obstruction due to location of the disease  Pancytopenia, acquired [facility]) She had mild pancytopenia from recent treatment She is not symptomatic Due to recent delay of treatment from pancytopenia, I would reduce a dose of Gemzar a little bit  Abdominal bloating She continues to have intermittent abdominal bloating I suspect it could be diet related We discussed dietary modification Recent CT scan looks good and I plan to repeat imaging study in August after completion of 6 cycles of chemotherapy   No orders of the defined types were placed in this encounter.  All questions were answered. The patient knows to call the clinic with any problems, questions or concerns. No barriers to learning was detected. I spent 15  minutes counseling the patient face to face. The total time spent in the appointment was 20 minutes and more than 50% was on counseling and review of test results     Heath Lark, MD 11/28/2016 12:38 PM

## 2016-11-28 NOTE — Assessment & Plan Note (Signed)
She continues to have intermittent abdominal bloating I suspect it could be diet related We discussed dietary modification Recent CT scan looks good and I plan to repeat imaging study in August after completion of 6 cycles of chemotherapy

## 2016-11-28 NOTE — Assessment & Plan Note (Signed)
She tolerated treatment well except for fatigue and nausea We will proceed with treatment with minor dose adjustment to Gemzar due to recent pancytopenia and delay of treatment I have adjusted her antiemetics CT scan show excellent response to treatment. I recommend we continue minimum 6 cycles of treatment.  I would consider adding Avastin to future treatment but would prefer to hold off due to concern for possible bowel obstruction due to location of the disease

## 2016-12-05 ENCOUNTER — Ambulatory Visit: Payer: Self-pay

## 2016-12-05 ENCOUNTER — Other Ambulatory Visit (HOSPITAL_BASED_OUTPATIENT_CLINIC_OR_DEPARTMENT_OTHER): Payer: Self-pay

## 2016-12-05 ENCOUNTER — Ambulatory Visit (HOSPITAL_BASED_OUTPATIENT_CLINIC_OR_DEPARTMENT_OTHER): Payer: Self-pay

## 2016-12-05 VITALS — BP 113/68 | HR 83 | Temp 98.9°F | Resp 18

## 2016-12-05 DIAGNOSIS — C569 Malignant neoplasm of unspecified ovary: Secondary | ICD-10-CM

## 2016-12-05 DIAGNOSIS — Z95828 Presence of other vascular implants and grafts: Secondary | ICD-10-CM

## 2016-12-05 DIAGNOSIS — C786 Secondary malignant neoplasm of retroperitoneum and peritoneum: Secondary | ICD-10-CM

## 2016-12-05 DIAGNOSIS — C801 Malignant (primary) neoplasm, unspecified: Secondary | ICD-10-CM

## 2016-12-05 DIAGNOSIS — Z5111 Encounter for antineoplastic chemotherapy: Secondary | ICD-10-CM

## 2016-12-05 LAB — COMPREHENSIVE METABOLIC PANEL
ALBUMIN: 3.7 g/dL (ref 3.5–5.0)
ALT: 20 U/L (ref 0–55)
ANION GAP: 10 meq/L (ref 3–11)
AST: 17 U/L (ref 5–34)
Alkaline Phosphatase: 94 U/L (ref 40–150)
BILIRUBIN TOTAL: 0.36 mg/dL (ref 0.20–1.20)
BUN: 12 mg/dL (ref 7.0–26.0)
CALCIUM: 9.5 mg/dL (ref 8.4–10.4)
CHLORIDE: 101 meq/L (ref 98–109)
CO2: 26 mEq/L (ref 22–29)
CREATININE: 0.8 mg/dL (ref 0.6–1.1)
EGFR: 70 mL/min/{1.73_m2} — ABNORMAL LOW (ref >90)
Glucose: 141 mg/dl — ABNORMAL HIGH (ref 70–140)
Potassium: 4 mEq/L (ref 3.5–5.1)
Sodium: 136 mEq/L (ref 136–145)
Total Protein: 7 g/dL (ref 6.4–8.3)

## 2016-12-05 LAB — CBC WITH DIFFERENTIAL/PLATELET
BASO%: 0.5 % (ref 0.0–2.0)
BASOS ABS: 0 10*3/uL (ref 0.0–0.1)
EOS%: 0.4 % (ref 0.0–7.0)
Eosinophils Absolute: 0 10*3/uL (ref 0.0–0.5)
HEMATOCRIT: 29.9 % — AB (ref 34.8–46.6)
HEMOGLOBIN: 10.1 g/dL — AB (ref 11.6–15.9)
LYMPH#: 1.6 10*3/uL (ref 0.9–3.3)
LYMPH%: 40.1 % (ref 14.0–49.7)
MCH: 31.5 pg (ref 25.1–34.0)
MCHC: 33.7 g/dL (ref 31.5–36.0)
MCV: 93.5 fL (ref 79.5–101.0)
MONO#: 0.2 10*3/uL (ref 0.1–0.9)
MONO%: 4.8 % (ref 0.0–14.0)
NEUT#: 2.1 10*3/uL (ref 1.5–6.5)
NEUT%: 54.2 % (ref 38.4–76.8)
PLATELETS: 233 10*3/uL (ref 145–400)
RBC: 3.2 10*6/uL — ABNORMAL LOW (ref 3.70–5.45)
RDW: 23.8 % — AB (ref 11.2–14.5)
WBC: 3.9 10*3/uL (ref 3.9–10.3)

## 2016-12-05 MED ORDER — ONDANSETRON HCL 4 MG/2ML IJ SOLN
8.0000 mg | Freq: Once | INTRAMUSCULAR | Status: AC
  Administered 2016-12-05: 8 mg via INTRAVENOUS

## 2016-12-05 MED ORDER — SODIUM CHLORIDE 0.9% FLUSH
10.0000 mL | Freq: Once | INTRAVENOUS | Status: AC
  Administered 2016-12-05: 10 mL
  Filled 2016-12-05: qty 10

## 2016-12-05 MED ORDER — ONDANSETRON HCL 4 MG/2ML IJ SOLN
INTRAMUSCULAR | Status: AC
  Filled 2016-12-05: qty 4

## 2016-12-05 MED ORDER — SODIUM CHLORIDE 0.9 % IV SOLN
Freq: Once | INTRAVENOUS | Status: AC
  Administered 2016-12-05: 13:00:00 via INTRAVENOUS

## 2016-12-05 MED ORDER — SODIUM CHLORIDE 0.9% FLUSH
10.0000 mL | INTRAVENOUS | Status: DC | PRN
  Administered 2016-12-05: 10 mL
  Filled 2016-12-05: qty 10

## 2016-12-05 MED ORDER — HEPARIN SOD (PORK) LOCK FLUSH 100 UNIT/ML IV SOLN
500.0000 [IU] | Freq: Once | INTRAVENOUS | Status: AC | PRN
  Administered 2016-12-05: 500 [IU]
  Filled 2016-12-05: qty 5

## 2016-12-05 MED ORDER — SODIUM CHLORIDE 0.9 % IV SOLN
640.0000 mg/m2 | Freq: Once | INTRAVENOUS | Status: AC
  Administered 2016-12-05: 1140 mg via INTRAVENOUS
  Filled 2016-12-05: qty 29.98

## 2016-12-05 NOTE — Patient Instructions (Signed)
[facility] Discharge Instructions for Patients Receiving Chemotherapy  Today you received the following chemotherapy agents Gemzar  To help prevent nausea and vomiting after your treatment, we encourage you to take your nausea medication as directed.    If you develop nausea and vomiting that is not controlled by your nausea medication, call the clinic.   BELOW ARE SYMPTOMS THAT SHOULD BE REPORTED IMMEDIATELY:  *FEVER GREATER THAN 100.5 F  *CHILLS WITH OR WITHOUT FEVER  NAUSEA AND VOMITING THAT IS NOT CONTROLLED WITH YOUR NAUSEA MEDICATION  *UNUSUAL SHORTNESS OF BREATH  *UNUSUAL BRUISING OR BLEEDING  TENDERNESS IN MOUTH AND THROAT WITH OR WITHOUT PRESENCE OF ULCERS  *URINARY PROBLEMS  *BOWEL PROBLEMS  UNUSUAL RASH Items with * indicate a potential emergency and should be followed up as soon as possible.  Feel free to call the clinic you have any questions or concerns. The clinic phone number is [phone].  Please show the CHEMO ALERT CARD at check-in to the Emergency Department and triage nurse.   

## 2016-12-06 LAB — CA 125: CANCER ANTIGEN (CA) 125: 9.6 U/mL (ref 0.0–38.1)

## 2016-12-19 ENCOUNTER — Ambulatory Visit (HOSPITAL_BASED_OUTPATIENT_CLINIC_OR_DEPARTMENT_OTHER): Payer: Self-pay

## 2016-12-19 ENCOUNTER — Ambulatory Visit: Payer: Self-pay

## 2016-12-19 ENCOUNTER — Ambulatory Visit (HOSPITAL_BASED_OUTPATIENT_CLINIC_OR_DEPARTMENT_OTHER): Payer: Self-pay | Admitting: Hematology and Oncology

## 2016-12-19 ENCOUNTER — Telehealth: Payer: Self-pay | Admitting: Hematology and Oncology

## 2016-12-19 ENCOUNTER — Other Ambulatory Visit (HOSPITAL_BASED_OUTPATIENT_CLINIC_OR_DEPARTMENT_OTHER): Payer: Self-pay

## 2016-12-19 VITALS — BP 144/71 | HR 83 | Temp 98.1°F | Resp 18 | Ht 63.0 in | Wt 163.8 lb

## 2016-12-19 DIAGNOSIS — C786 Secondary malignant neoplasm of retroperitoneum and peritoneum: Secondary | ICD-10-CM

## 2016-12-19 DIAGNOSIS — C801 Malignant (primary) neoplasm, unspecified: Secondary | ICD-10-CM

## 2016-12-19 DIAGNOSIS — Z95828 Presence of other vascular implants and grafts: Secondary | ICD-10-CM

## 2016-12-19 DIAGNOSIS — D61818 Other pancytopenia: Secondary | ICD-10-CM

## 2016-12-19 DIAGNOSIS — D61810 Antineoplastic chemotherapy induced pancytopenia: Secondary | ICD-10-CM

## 2016-12-19 DIAGNOSIS — C569 Malignant neoplasm of unspecified ovary: Secondary | ICD-10-CM

## 2016-12-19 DIAGNOSIS — Z5111 Encounter for antineoplastic chemotherapy: Secondary | ICD-10-CM

## 2016-12-19 LAB — CBC WITH DIFFERENTIAL/PLATELET
BASO%: 0.3 % (ref 0.0–2.0)
BASOS ABS: 0 10*3/uL (ref 0.0–0.1)
EOS ABS: 0 10*3/uL (ref 0.0–0.5)
EOS%: 0.3 % (ref 0.0–7.0)
HEMATOCRIT: 28.6 % — AB (ref 34.8–46.6)
HEMOGLOBIN: 9.3 g/dL — AB (ref 11.6–15.9)
LYMPH%: 38.5 % (ref 14.0–49.7)
MCH: 31.5 pg (ref 25.1–34.0)
MCHC: 32.5 g/dL (ref 31.5–36.0)
MCV: 96.9 fL (ref 79.5–101.0)
MONO#: 0.7 10*3/uL (ref 0.1–0.9)
MONO%: 24.6 % — AB (ref 0.0–14.0)
NEUT#: 1.1 10*3/uL — ABNORMAL LOW (ref 1.5–6.5)
NEUT%: 36.3 % — AB (ref 38.4–76.8)
Platelets: 218 10*3/uL (ref 145–400)
RBC: 2.95 10*6/uL — ABNORMAL LOW (ref 3.70–5.45)
RDW: 20.5 % — AB (ref 11.2–14.5)
WBC: 3 10*3/uL — ABNORMAL LOW (ref 3.9–10.3)
lymph#: 1.2 10*3/uL (ref 0.9–3.3)

## 2016-12-19 LAB — COMPREHENSIVE METABOLIC PANEL
ALBUMIN: 3.6 g/dL (ref 3.5–5.0)
ALK PHOS: 92 U/L (ref 40–150)
ALT: 11 U/L (ref 0–55)
AST: 14 U/L (ref 5–34)
Anion Gap: 11 mEq/L (ref 3–11)
BUN: 10.6 mg/dL (ref 7.0–26.0)
CALCIUM: 9.1 mg/dL (ref 8.4–10.4)
CO2: 24 mEq/L (ref 22–29)
Chloride: 102 mEq/L (ref 98–109)
Creatinine: 0.8 mg/dL (ref 0.6–1.1)
EGFR: 74 mL/min/{1.73_m2} — AB (ref >90)
Glucose: 123 mg/dl (ref 70–140)
POTASSIUM: 3.8 meq/L (ref 3.5–5.1)
SODIUM: 137 meq/L (ref 136–145)
Total Bilirubin: 0.27 mg/dL (ref 0.20–1.20)
Total Protein: 6.7 g/dL (ref 6.4–8.3)

## 2016-12-19 MED ORDER — SODIUM CHLORIDE 0.9% FLUSH
10.0000 mL | INTRAVENOUS | Status: DC | PRN
  Administered 2016-12-19: 10 mL
  Filled 2016-12-19: qty 10

## 2016-12-19 MED ORDER — PALONOSETRON HCL INJECTION 0.25 MG/5ML
0.2500 mg | Freq: Once | INTRAVENOUS | Status: AC
  Administered 2016-12-19: 0.25 mg via INTRAVENOUS

## 2016-12-19 MED ORDER — SODIUM CHLORIDE 0.9% FLUSH
10.0000 mL | Freq: Once | INTRAVENOUS | Status: AC
  Administered 2016-12-19: 10 mL
  Filled 2016-12-19: qty 10

## 2016-12-19 MED ORDER — HEPARIN SOD (PORK) LOCK FLUSH 100 UNIT/ML IV SOLN
500.0000 [IU] | Freq: Once | INTRAVENOUS | Status: AC | PRN
  Administered 2016-12-19: 500 [IU]
  Filled 2016-12-19: qty 5

## 2016-12-19 MED ORDER — SODIUM CHLORIDE 0.9 % IV SOLN
1100.0000 mg | Freq: Once | INTRAVENOUS | Status: AC
  Administered 2016-12-19: 1100 mg via INTRAVENOUS
  Filled 2016-12-19: qty 28.93

## 2016-12-19 MED ORDER — PALONOSETRON HCL INJECTION 0.25 MG/5ML
INTRAVENOUS | Status: AC
  Filled 2016-12-19: qty 5

## 2016-12-19 MED ORDER — SODIUM CHLORIDE 0.9 % IV SOLN
Freq: Once | INTRAVENOUS | Status: AC
  Administered 2016-12-19: 11:00:00 via INTRAVENOUS

## 2016-12-19 MED ORDER — SODIUM CHLORIDE 0.9 % IV SOLN
403.5000 mg | Freq: Once | INTRAVENOUS | Status: AC
  Administered 2016-12-19: 400 mg via INTRAVENOUS
  Filled 2016-12-19: qty 40

## 2016-12-19 MED ORDER — SODIUM CHLORIDE 0.9 % IV SOLN
Freq: Once | INTRAVENOUS | Status: AC
  Administered 2016-12-19: 13:00:00 via INTRAVENOUS
  Filled 2016-12-19: qty 5

## 2016-12-19 MED ORDER — CARBOPLATIN CHEMO INTRADERMAL TEST DOSE 100MCG/0.02ML
100.0000 ug | Freq: Once | INTRADERMAL | Status: AC
  Administered 2016-12-19: 100 ug via INTRADERMAL
  Filled 2016-12-19: qty 0.02

## 2016-12-19 NOTE — Patient Instructions (Signed)
[place] Discharge Instructions for Patients Receiving Chemotherapy  Today you received the following chemotherapy agents Carboplatin/ Gemzar  To help prevent nausea and vomiting after your treatment, we encourage you to take your nausea medication as needed   If you develop nausea and vomiting that is not controlled by your nausea medication, call the clinic.   BELOW ARE SYMPTOMS THAT SHOULD BE REPORTED IMMEDIATELY:  *FEVER GREATER THAN 100.5 F  *CHILLS WITH OR WITHOUT FEVER  NAUSEA AND VOMITING THAT IS NOT CONTROLLED WITH YOUR NAUSEA MEDICATION  *UNUSUAL SHORTNESS OF BREATH  *UNUSUAL BRUISING OR BLEEDING  TENDERNESS IN MOUTH AND THROAT WITH OR WITHOUT PRESENCE OF ULCERS  *URINARY PROBLEMS  *BOWEL PROBLEMS  UNUSUAL RASH Items with * indicate a potential emergency and should be followed up as soon as possible.  Feel free to call the clinic you have any questions or concerns. The clinic phone number is [phone].  Please show the [place] at check-in to the Emergency Department and triage nurse.

## 2016-12-19 NOTE — Telephone Encounter (Signed)
Scheduled appt per 7/17 los - patient called back to treatment area before schedule was finished - patient aware to have Rn print out schedule if not printed.

## 2016-12-19 NOTE — Patient Instructions (Signed)

## 2016-12-19 NOTE — Progress Notes (Signed)
Ok to treat per MD 

## 2016-12-20 ENCOUNTER — Encounter: Payer: Self-pay | Admitting: Hematology and Oncology

## 2016-12-20 LAB — CA 125: Cancer Antigen (CA) 125: 8 U/mL (ref 0.0–38.1)

## 2016-12-20 NOTE — Assessment & Plan Note (Signed)
She had mild pancytopenia from recent treatment She is not symptomatic Due to recent delay of treatment from pancytopenia, I would continue at reduced dose of Gemzar a little bit

## 2016-12-20 NOTE — Assessment & Plan Note (Signed)
She tolerated treatment well except for fatigue and nausea We will proceed with treatment with minor dose adjustment to Gemzar due to recent pancytopenia  I have adjusted her antiemetics CT scan show excellent response to treatment. I recommend we continue minimum 6 cycles of treatment.  I would consider adding Avastin to future treatment but would prefer to hold off due to concern for possible bowel obstruction due to location of the disease

## 2016-12-20 NOTE — Progress Notes (Signed)
[place] OFFICE PROGRESS NOTE  Patient Care Team: Patient, No Pcp Per as PCP - General (General Practice)  SUMMARY OF ONCOLOGIC HISTORY:   Ovarian carcinoma ([place])   04/19/2015 Initial Diagnosis    In late 03-2015 she in [place] and was found to have large pelvic mass and perihepatic ascites. CA 125 then was 359, with CEA of 3, CA 19-9 5.8 and alpha fetoprotein 2.3. She had exploratory laparotomy 04-19-15, not done by gyn oncologist, with bilateral ovaried an tumors, 10 cm with capsule ruptured on right , smaller on left, peritoneal carcinomatosis.       05/20/2015 Miscellaneous    She had some bowel obstruction post operatively, with NG used . Pathology reportedcystic and solid left ovarian mass, 13 cm right ovarian mass, moderately differentiated tumor "tumor de celulas de la granulosa". 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 CT AP 05-2015 had ascites, carcinomatosis and multiple peritoneal implants. CA 125 on 05-24-15 baseline for chemo was 141. She received 3 cycles of taxol + carboplatin from 05-26-15 thru 07-07-15 (not clear if avastin given those cycles), after which carboplatin was not available in [place]. She received taxol 175 mg/m2, CDDP and avastin for 3 additional cycles thru 10-07-15. Second look laparotomy was discussed, patient declined, and instead had 4 cycles avastin + taxol from 12-01-15 thru 02-02-16 (apparentlyno CDDP after 10-07-15 and no carboplatin after 07-07-15). CT CAP 02-24-16 reportedly had no evidence of malignancy, with some bullous emphysema, tortuous sigmoid colon with diverticulosis. . Recommendation was for maintenance avastin x 1 year, however patient then came to  to be with family.       05/20/2016 Imaging    Extensive peritoneal disease with omental thickening/ 18 and large volume ascites. 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  Trace bilateral pleural effusions. Moderate hiatal hernia. Bilateral inguinal hernias containing fat. Left colonic diverticulosis. Aortoiliac atherosclerosis.      05/20/2016 - 06/13/2016 Hospital Admission    76 year old female with a history of metastatic ovarian cancer diagnosed in January 2017 (s/p primary debulking surgery followed by 8 adjuvant cycles of carboplatin and paclitaxel and 4 additional cycles of carboplatin, paclitaxel, avastin), last chemo Sept 2017, presented with progressive abdominal bloating and pain for 3 weeks duration. She was feeling well until the first week of December, 2017 when she was sick with a virus from which she never recovered. She states that she began feeling distended in the abdomen and uncomfortable and was taken to the ED on 05/20/16. CT chest/abd/pelvis9-21-17 reportedly with no evidence of malignancy, with some bullous emphysema, tortuous sigmoid colon with diverticulosis. Recommendation was for maintenance avastin x 1 year, however patient then came to [facility] in 02-2016. She has not established care with any physician since coming to [place]. Unfortunately, after paracentesis she began having increasing abdominal pain. Abdominal x-ray revealed distended small bowel loops concerning for small bowel obstruction. Gen. surgery was consulted. NG was inserted to suction. Because of prolonged Hospitalization she was placed on TPN/TNA. She underwent Chemotheray with Doxil on 12/26. Repeat U/S 12/31 showed interval progression of ascites and She is s/p repeat Paracentesis 06-06-16 which removed 4 liters. She was subsequently discharged      05/21/2016 Tumor Marker    Patient's tumor was tested for the following markers: CA125 Results of the tumor marker test revealed 249.8      05/22/2016 Procedure    Successful ultrasound-guided paracentesis yielding 2.9 liters of peritoneal fluid.  05/22/2016 Pathology Results    PERITONEAL/ASCITIC FLUID, 1 OF 1 COLLECTED ON  05/22/16: ADENOCARCINOMA. ADDENDUM: Immunohistochemistry is performed on the cell block and the tumor is strongly positive with cytokeratin 7 and cytokeratin 8/18 and shows patchy positivity with estrogen receptor, cytokeratin 5/6, WT-1 and MOC-31 with focal weak positivity with PAX-8. The tumor is negative with thyroglobulin, thyroid transcription factor-1, Napsin-A, progesterone receptor, GATA 3, gross cystic disease fluid protein, cytokeratin 20, CDX-2 and Calretinin. 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       05/24/2016 Procedure    Ultrasound and fluoroscopically guided right internal jugular single lumen power port catheter insertion. Tip in the SVC/RA junction. Catheter ready for use      05/24/2016 Imaging    LV EF: 65% -   70%      05/28/2016 Imaging    Small volume of abdominal and pelvic ascites is identified and inadequate for paracentesis.      05/30/2016 - 08/21/2016 Chemotherapy    She received doxorubicin x 3 cycles, stopped due to disease progression      06/04/2016 Imaging    Increased amount of ascites, freely distributed in all 4 quadrants. No other acute finding by ultrasound.      06/06/2016 Imaging    Small left greater than right pleural effusions, increased compared to prior CT. Interim development of partial consolidations within the bilateral lower lobes which may reflect atelectasis or pneumonia. Few scattered foci of ground-glass density within the right upper and left upper lobes, suspect small foci of inflammation or infection. 2. Moderate to large volume of ascites in the upper abdomen with extensive mesenteric and omental metastatic disease partially visualized. 3. Dilated gallbladder.      06/08/2016 Procedure    Successful ultrasound and fluoroscopic guided placement of a tunneled peritoneal drainage catheter.  2. Successful aspiration of 900 cc of serous ascites following tunneled peritoneal drainage catheter placement.      06/19/2016 Tumor Marker    Patient's tumor was tested for the following markers: CA125 Results of the tumor marker test revealed 166.6      07/24/2016 Tumor Marker    Patient's tumor was tested for the following markers: CA125 Results of the tumor marker test revealed 104.4      08/18/2016 Imaging    Extensive peritoneal metastatic disease within the abdomen. This is somewhat difficult to compare with prior given diffuse nature however there is suggestion of interval progression. Interval resolution of previously described bilateral pleural effusions. Peritoneal drainage catheter is present with tip terminating in the right upper quadrant. Interval decrease in now small volume ascites. Aortic atherosclerosis. Diverticulosis without CT evidence for acute diverticulitis.      08/18/2016 Imaging    LV EF: 60% -  65%      08/21/2016 Tumor Marker    Patient's tumor was tested for the following markers: CA125 Results of the tumor marker test revealed 96.1      08/29/2016 -  Chemotherapy    She received gemzar and carboplatin      09/05/2016 Tumor Marker    Patient's tumor was tested for the following markers: CA125 Results of the tumor marker test revealed 48.1      09/20/2016 Procedure    Status post bedside removal of tunneled right-sided peritoneal drain which was removed in its entirety.      10/03/2016 Tumor Marker    Patient's tumor  was tested for the following markers: CA125 Results of the tumor marker test revealed 22.9      11/03/2016 Imaging    Interval decrease in peritoneal metastatic disease when compared with prior exam. Aortic atherosclerosis. Sigmoid colonic diverticulosis.      11/07/2016 Tumor Marker    Patient's tumor was tested for the following markers: CA125 Results of the tumor marker test revealed 9.6      12/05/2016 Tumor Marker    Patient's  tumor was tested for the following markers: CA125 Results of the tumor marker test revealed 9.6      12/19/2016 Tumor Marker    Patient's tumor was tested for the following markers: CA-125 Results of the tumor marker test revealed 8.       INTERVAL HISTORY: Please see below for problem oriented charting. She returns for further follow-up She tolerated recent chemotherapy well She denies significant pain No nausea vomiting She complain of fatigue. Denies peripheral neuropathy. No recent infection, fever or chills. Her daughter serves as an Astronomer.  REVIEW OF SYSTEMS:   Constitutional: Denies fevers, chills or abnormal weight loss Eyes: Denies blurriness of vision Ears, nose, mouth, throat, and face: Denies mucositis or sore throat Respiratory: Denies cough, dyspnea or wheezes Cardiovascular: Denies palpitation, chest discomfort or lower extremity swelling Gastrointestinal:  Denies nausea, heartburn or change in bowel habits Skin: Denies abnormal skin rashes Lymphatics: Denies new lymphadenopathy or easy bruising Neurological:Denies numbness, tingling or new weaknesses Behavioral/Psych: Mood is stable, no new changes  All other systems were reviewed with the patient and are negative.  I have reviewed the past medical history, past surgical history, social history and family history with the patient and they are unchanged from previous note.  ALLERGIES:  has No Known Allergies.  MEDICATIONS:  Current Outpatient Prescriptions  Medication Sig Dispense Refill  . acetaminophen (TYLENOL) 325 MG tablet Take 650 mg by mouth every 5 (five) hours as needed for moderate pain.    Marland Kitchen lidocaine-prilocaine (EMLA) cream Apply to port 1 hour before access with needle. Cover with plastic wrap. (Patient not taking: Reported on 11/07/2016) 30 g 11  . ondansetron (ZOFRAN ODT) 8 MG disintegrating tablet Take 1 tablet (8 mg total) by mouth every 8 (eight) hours as needed for nausea or vomiting.  (Patient not taking: Reported on 11/07/2016) 30 tablet 0  . promethazine (PHENERGAN) 25 MG tablet Take 1 tablet (25 mg total) by mouth every 6 (six) hours as needed for nausea or vomiting. (Patient not taking: Reported on 11/07/2016) 30 tablet 9   No current facility-administered medications for this visit.     PHYSICAL EXAMINATION: ECOG PERFORMANCE STATUS: 1 - Symptomatic but completely ambulatory  Vitals:   12/19/16 1038  BP: (!) 144/71  Pulse: 83  Resp: 18  Temp: 98.1 F (36.7 C)   Filed Weights   12/19/16 1038  Weight: 163 lb 12.8 oz (74.3 kg)    GENERAL:alert, no distress and comfortable SKIN: skin color, texture, turgor are normal, no rashes or significant lesions EYES: normal, Conjunctiva are pink and non-injected, sclera clear OROPHARYNX:no exudate, no erythema and lips, buccal mucosa, and tongue normal  NECK: supple, thyroid normal size, non-tender, without nodularity LYMPH:  no palpable lymphadenopathy in the cervical, axillary or inguinal LUNGS: clear to auscultation and percussion with normal breathing effort HEART: regular rate & rhythm and no murmurs and no lower extremity edema ABDOMEN:abdomen soft, non-tender and normal bowel sounds Musculoskeletal:no cyanosis of digits and no clubbing  NEURO: alert & oriented x 3  with fluent speech, no focal motor/sensory deficits  LABORATORY DATA:  I have reviewed the data as listed    Component Value Date/Time   NA 137 12/19/2016 1010   K 3.8 12/19/2016 1010   CL 100 (L) 09/20/2016 1252   CO2 24 12/19/2016 1010   GLUCOSE 123 12/19/2016 1010   BUN 10.6 12/19/2016 1010   CREATININE 0.8 12/19/2016 1010   CALCIUM 9.1 12/19/2016 1010   PROT 6.7 12/19/2016 1010   ALBUMIN 3.6 12/19/2016 1010   AST 14 12/19/2016 1010   ALT 11 12/19/2016 1010   ALKPHOS 92 12/19/2016 1010   BILITOT 0.27 12/19/2016 1010   GFRNONAA >60 09/20/2016 1252   GFRAA >60 09/20/2016 1252    No results found for: SPEP, UPEP  Lab Results  Component  Value Date   WBC 3.0 (L) 12/19/2016   NEUTROABS 1.1 (L) 12/19/2016   HGB 9.3 (L) 12/19/2016   HCT 28.6 (L) 12/19/2016   MCV 96.9 12/19/2016   PLT 218 12/19/2016      Chemistry      Component Value Date/Time   NA 137 12/19/2016 1010   K 3.8 12/19/2016 1010   CL 100 (L) 09/20/2016 1252   CO2 24 12/19/2016 1010   BUN 10.6 12/19/2016 1010   CREATININE 0.8 12/19/2016 1010      Component Value Date/Time   CALCIUM 9.1 12/19/2016 1010   ALKPHOS 92 12/19/2016 1010   AST 14 12/19/2016 1010   ALT 11 12/19/2016 1010   BILITOT 0.27 12/19/2016 1010      ASSESSMENT & PLAN:  Ovarian carcinoma ([place]) She tolerated treatment well except for fatigue and nausea We will proceed with treatment with minor dose adjustment to Gemzar due to recent pancytopenia  I have adjusted her antiemetics CT scan show excellent response to treatment. I recommend we continue minimum 6 cycles of treatment.  I would consider adding Avastin to future treatment but would prefer to hold off due to concern for possible bowel obstruction due to location of the disease  Pancytopenia, acquired [facility]) She had mild pancytopenia from recent treatment She is not symptomatic Due to recent delay of treatment from pancytopenia, I would continue at reduced dose of Gemzar a little bit   Orders Placed This Encounter  Procedures  . CT ABDOMEN PELVIS W CONTRAST    Standing Status:   Future    Standing Expiration Date:   03/21/2018    Order Specific Question:   Reason for Exam (SYMPTOM  OR DIAGNOSIS REQUIRED)    Answer:   staging ovarian ca, assess response to Rx    Order Specific Question:   Preferred imaging location?    Answer:   [facility]   All questions were answered. The patient knows to call the clinic with any problems, questions or concerns. No barriers to learning was detected. I spent 15 minutes counseling the patient face to face. The total time spent in the appointment was 20 minutes and more than 50%  was on counseling and review of test results     Heath Lark, MD 12/20/2016 6:32 PM

## 2016-12-26 ENCOUNTER — Ambulatory Visit: Payer: Self-pay

## 2016-12-26 ENCOUNTER — Other Ambulatory Visit: Payer: Self-pay | Admitting: Hematology and Oncology

## 2016-12-26 ENCOUNTER — Other Ambulatory Visit (HOSPITAL_BASED_OUTPATIENT_CLINIC_OR_DEPARTMENT_OTHER): Payer: Self-pay

## 2016-12-26 ENCOUNTER — Ambulatory Visit (HOSPITAL_BASED_OUTPATIENT_CLINIC_OR_DEPARTMENT_OTHER): Payer: Self-pay

## 2016-12-26 VITALS — BP 139/73 | HR 80 | Temp 98.2°F | Resp 16

## 2016-12-26 DIAGNOSIS — C801 Malignant (primary) neoplasm, unspecified: Secondary | ICD-10-CM

## 2016-12-26 DIAGNOSIS — Z5111 Encounter for antineoplastic chemotherapy: Secondary | ICD-10-CM

## 2016-12-26 DIAGNOSIS — Z95828 Presence of other vascular implants and grafts: Secondary | ICD-10-CM

## 2016-12-26 DIAGNOSIS — C786 Secondary malignant neoplasm of retroperitoneum and peritoneum: Secondary | ICD-10-CM

## 2016-12-26 DIAGNOSIS — C569 Malignant neoplasm of unspecified ovary: Secondary | ICD-10-CM

## 2016-12-26 LAB — CBC WITH DIFFERENTIAL/PLATELET
BASO%: 0.5 % (ref 0.0–2.0)
BASOS ABS: 0 10*3/uL (ref 0.0–0.1)
EOS ABS: 0 10*3/uL (ref 0.0–0.5)
EOS%: 0.2 % (ref 0.0–7.0)
HCT: 27.9 % — ABNORMAL LOW (ref 34.8–46.6)
HGB: 9.3 g/dL — ABNORMAL LOW (ref 11.6–15.9)
LYMPH%: 53.2 % — AB (ref 14.0–49.7)
MCH: 32.5 pg (ref 25.1–34.0)
MCHC: 33.5 g/dL (ref 31.5–36.0)
MCV: 96.8 fL (ref 79.5–101.0)
MONO#: 0.3 10*3/uL (ref 0.1–0.9)
MONO%: 8.7 % (ref 0.0–14.0)
NEUT%: 37.4 % — AB (ref 38.4–76.8)
NEUTROS ABS: 1.2 10*3/uL — AB (ref 1.5–6.5)
PLATELETS: 340 10*3/uL (ref 145–400)
RBC: 2.88 10*6/uL — AB (ref 3.70–5.45)
RDW: 20 % — ABNORMAL HIGH (ref 11.2–14.5)
WBC: 3.2 10*3/uL — ABNORMAL LOW (ref 3.9–10.3)
lymph#: 1.7 10*3/uL (ref 0.9–3.3)

## 2016-12-26 LAB — COMPREHENSIVE METABOLIC PANEL
ALT: 23 U/L (ref 0–55)
ANION GAP: 7 meq/L (ref 3–11)
AST: 20 U/L (ref 5–34)
Albumin: 3.7 g/dL (ref 3.5–5.0)
Alkaline Phosphatase: 97 U/L (ref 40–150)
BILIRUBIN TOTAL: 0.23 mg/dL (ref 0.20–1.20)
BUN: 9.8 mg/dL (ref 7.0–26.0)
CO2: 24 meq/L (ref 22–29)
Calcium: 9.1 mg/dL (ref 8.4–10.4)
Chloride: 101 mEq/L (ref 98–109)
Creatinine: 0.7 mg/dL (ref 0.6–1.1)
EGFR: 84 mL/min/{1.73_m2} — AB (ref >90)
Glucose: 99 mg/dl (ref 70–140)
POTASSIUM: 4.1 meq/L (ref 3.5–5.1)
Sodium: 132 mEq/L — ABNORMAL LOW (ref 136–145)
Total Protein: 7 g/dL (ref 6.4–8.3)

## 2016-12-26 MED ORDER — SODIUM CHLORIDE 0.9 % IV SOLN
1100.0000 mg | Freq: Once | INTRAVENOUS | Status: AC
  Administered 2016-12-26: 1100 mg via INTRAVENOUS
  Filled 2016-12-26: qty 28.93

## 2016-12-26 MED ORDER — HEPARIN SOD (PORK) LOCK FLUSH 100 UNIT/ML IV SOLN
500.0000 [IU] | Freq: Once | INTRAVENOUS | Status: AC | PRN
  Administered 2016-12-26: 500 [IU]
  Filled 2016-12-26: qty 5

## 2016-12-26 MED ORDER — SODIUM CHLORIDE 0.9% FLUSH
10.0000 mL | INTRAVENOUS | Status: DC | PRN
  Administered 2016-12-26: 10 mL
  Filled 2016-12-26: qty 10

## 2016-12-26 MED ORDER — ONDANSETRON HCL 4 MG/2ML IJ SOLN
INTRAMUSCULAR | Status: AC
  Filled 2016-12-26: qty 4

## 2016-12-26 MED ORDER — SODIUM CHLORIDE 0.9% FLUSH
10.0000 mL | Freq: Once | INTRAVENOUS | Status: AC
  Administered 2016-12-26: 10 mL
  Filled 2016-12-26: qty 10

## 2016-12-26 MED ORDER — ONDANSETRON HCL 4 MG/2ML IJ SOLN
8.0000 mg | Freq: Once | INTRAMUSCULAR | Status: AC
  Administered 2016-12-26: 8 mg via INTRAVENOUS

## 2016-12-26 MED ORDER — SODIUM CHLORIDE 0.9 % IV SOLN
Freq: Once | INTRAVENOUS | Status: AC
  Administered 2016-12-26: 14:00:00 via INTRAVENOUS

## 2016-12-26 NOTE — Patient Instructions (Signed)

## 2016-12-26 NOTE — Patient Instructions (Signed)
Cancer Center Discharge Instructions for Patients Receiving Chemotherapy  Today you received the following chemotherapy agents Gemzar  To help prevent nausea and vomiting after your treatment, we encourage you to take your nausea medication as directed.    If you develop nausea and vomiting that is not controlled by your nausea medication, call the clinic.   BELOW ARE SYMPTOMS THAT SHOULD BE REPORTED IMMEDIATELY:  *FEVER GREATER THAN 100.5 F  *CHILLS WITH OR WITHOUT FEVER  NAUSEA AND VOMITING THAT IS NOT CONTROLLED WITH YOUR NAUSEA MEDICATION  *UNUSUAL SHORTNESS OF BREATH  *UNUSUAL BRUISING OR BLEEDING  TENDERNESS IN MOUTH AND THROAT WITH OR WITHOUT PRESENCE OF ULCERS  *URINARY PROBLEMS  *BOWEL PROBLEMS  UNUSUAL RASH Items with * indicate a potential emergency and should be followed up as soon as possible.  Feel free to call the clinic you have any questions or concerns. The clinic phone number is [phone].  Please show the CHEMO ALERT CARD at check-in to the Emergency Department and triage nurse.   

## 2017-01-01 ENCOUNTER — Telehealth: Payer: Self-pay

## 2017-01-01 NOTE — Telephone Encounter (Signed)
Radiology scheduler to call daughter and schedule CT.

## 2017-01-01 NOTE — Telephone Encounter (Signed)
-----   Message from Gaspar Bidding sent at 12/27/2016  1:35 PM EDT ----- Regarding: RE: schedule CT Hello April,  Patient is uninsured. Referral ready for scheduling.  Darlena ----- Message ----- From: Heath Lark, MD Sent: 12/27/2016   7:43 AM To: Patton Salles, RN, Gaspar Bidding, # Subject: schedule CT                                    She is not insured

## 2017-01-08 ENCOUNTER — Ambulatory Visit (HOSPITAL_COMMUNITY)
Admission: RE | Admit: 2017-01-08 | Discharge: 2017-01-08 | Disposition: A | Discharge status: Home / Self Care | Payer: Self-pay | Source: Ambulatory Visit | Attending: Hematology and Oncology | Admitting: Hematology and Oncology

## 2017-01-08 ENCOUNTER — Encounter (HOSPITAL_COMMUNITY): Payer: Self-pay

## 2017-01-08 DIAGNOSIS — Z90710 Acquired absence of both cervix and uterus: Secondary | ICD-10-CM | POA: Insufficient documentation

## 2017-01-08 DIAGNOSIS — K4020 Bilateral inguinal hernia, without obstruction or gangrene, not specified as recurrent: Secondary | ICD-10-CM | POA: Insufficient documentation

## 2017-01-08 DIAGNOSIS — K5730 Diverticulosis of large intestine without perforation or abscess without bleeding: Secondary | ICD-10-CM | POA: Insufficient documentation

## 2017-01-08 DIAGNOSIS — I700 Atherosclerosis of aorta: Secondary | ICD-10-CM | POA: Insufficient documentation

## 2017-01-08 DIAGNOSIS — C569 Malignant neoplasm of unspecified ovary: Secondary | ICD-10-CM | POA: Insufficient documentation

## 2017-01-08 MED ORDER — IOPAMIDOL (ISOVUE-300) INJECTION 61%
INTRAVENOUS | Status: AC
  Filled 2017-01-08: qty 100

## 2017-01-08 MED ORDER — IOPAMIDOL (ISOVUE-300) INJECTION 61%
100.0000 mL | Freq: Once | INTRAVENOUS | Status: AC | PRN
  Administered 2017-01-08: 100 mL via INTRAVENOUS

## 2017-01-09 ENCOUNTER — Other Ambulatory Visit (HOSPITAL_BASED_OUTPATIENT_CLINIC_OR_DEPARTMENT_OTHER): Payer: Self-pay

## 2017-01-09 ENCOUNTER — Other Ambulatory Visit: Payer: Self-pay | Admitting: *Deleted

## 2017-01-09 ENCOUNTER — Ambulatory Visit: Payer: Self-pay

## 2017-01-09 ENCOUNTER — Ambulatory Visit (HOSPITAL_BASED_OUTPATIENT_CLINIC_OR_DEPARTMENT_OTHER): Payer: Self-pay | Admitting: Hematology and Oncology

## 2017-01-09 ENCOUNTER — Telehealth: Payer: Self-pay

## 2017-01-09 DIAGNOSIS — C786 Secondary malignant neoplasm of retroperitoneum and peritoneum: Secondary | ICD-10-CM

## 2017-01-09 DIAGNOSIS — C569 Malignant neoplasm of unspecified ovary: Secondary | ICD-10-CM

## 2017-01-09 DIAGNOSIS — D6481 Anemia due to antineoplastic chemotherapy: Secondary | ICD-10-CM

## 2017-01-09 DIAGNOSIS — T451X5A Adverse effect of antineoplastic and immunosuppressive drugs, initial encounter: Secondary | ICD-10-CM

## 2017-01-09 DIAGNOSIS — Z7189 Other specified counseling: Secondary | ICD-10-CM

## 2017-01-09 DIAGNOSIS — Z95828 Presence of other vascular implants and grafts: Secondary | ICD-10-CM

## 2017-01-09 LAB — COMPREHENSIVE METABOLIC PANEL
ALBUMIN: 3.7 g/dL (ref 3.5–5.0)
ALK PHOS: 91 U/L (ref 40–150)
ALT: 12 U/L (ref 0–55)
ANION GAP: 8 meq/L (ref 3–11)
AST: 16 U/L (ref 5–34)
BILIRUBIN TOTAL: 0.23 mg/dL (ref 0.20–1.20)
BUN: 11.2 mg/dL (ref 7.0–26.0)
CO2: 25 mEq/L (ref 22–29)
Calcium: 9.1 mg/dL (ref 8.4–10.4)
Chloride: 102 mEq/L (ref 98–109)
Creatinine: 0.8 mg/dL (ref 0.6–1.1)
EGFR: 71 mL/min/{1.73_m2} — AB (ref >90)
GLUCOSE: 97 mg/dL (ref 70–140)
Potassium: 3.9 mEq/L (ref 3.5–5.1)
SODIUM: 136 meq/L (ref 136–145)
TOTAL PROTEIN: 6.9 g/dL (ref 6.4–8.3)

## 2017-01-09 LAB — CBC WITH DIFFERENTIAL/PLATELET
BASO%: 0.2 % (ref 0.0–2.0)
Basophils Absolute: 0 10*3/uL (ref 0.0–0.1)
EOS ABS: 0 10*3/uL (ref 0.0–0.5)
EOS%: 0.2 % (ref 0.0–7.0)
HCT: 28.9 % — ABNORMAL LOW (ref 34.8–46.6)
HEMOGLOBIN: 9.3 g/dL — AB (ref 11.6–15.9)
LYMPH%: 32.7 % (ref 14.0–49.7)
MCH: 32.2 pg (ref 25.1–34.0)
MCHC: 32.2 g/dL (ref 31.5–36.0)
MCV: 100 fL (ref 79.5–101.0)
MONO#: 1 10*3/uL — AB (ref 0.1–0.9)
MONO%: 22.1 % — ABNORMAL HIGH (ref 0.0–14.0)
NEUT%: 44.8 % (ref 38.4–76.8)
NEUTROS ABS: 2 10*3/uL (ref 1.5–6.5)
PLATELETS: 235 10*3/uL (ref 145–400)
RBC: 2.89 10*6/uL — ABNORMAL LOW (ref 3.70–5.45)
RDW: 18.4 % — AB (ref 11.2–14.5)
WBC: 4.6 10*3/uL (ref 3.9–10.3)
lymph#: 1.5 10*3/uL (ref 0.9–3.3)

## 2017-01-09 MED ORDER — HEPARIN SOD (PORK) LOCK FLUSH 100 UNIT/ML IV SOLN
500.0000 [IU] | Freq: Once | INTRAVENOUS | Status: AC
  Administered 2017-01-09: 500 [IU]
  Filled 2017-01-09: qty 5

## 2017-01-09 MED ORDER — SODIUM CHLORIDE 0.9% FLUSH
10.0000 mL | Freq: Once | INTRAVENOUS | Status: AC
  Administered 2017-01-09: 10 mL
  Filled 2017-01-09: qty 10

## 2017-01-09 NOTE — Patient Instructions (Signed)
Implanted Port Home Guide An implanted port is a type of central line that is placed under the skin. Central lines are used to provide IV access when treatment or nutrition needs to be given through a person's veins. Implanted ports are used for long-term IV access. An implanted port may be placed because:  You need IV medicine that would be irritating to the small veins in your hands or arms.  You need long-term IV medicines, such as antibiotics.  You need IV nutrition for a long period.  You need frequent blood draws for lab tests.  You need dialysis.  Implanted ports are usually placed in the chest area, but they can also be placed in the upper arm, the abdomen, or the leg. An implanted port has two main parts:  Reservoir. The reservoir is round and will appear as a small, raised area under your skin. The reservoir is the part where a needle is inserted to give medicines or draw blood.  Catheter. The catheter is a thin, flexible tube that extends from the reservoir. The catheter is placed into a large vein. Medicine that is inserted into the reservoir goes into the catheter and then into the vein.  How will I care for my incision site? Do not get the incision site wet. Bathe or shower as directed by your health care provider. How is my port accessed? Special steps must be taken to access the port:  Before the port is accessed, a numbing cream can be placed on the skin. This helps numb the skin over the port site.  Your health care provider uses a sterile technique to access the port. ? Your health care provider must put on a mask and sterile gloves. ? The skin over your port is cleaned carefully with an antiseptic and allowed to dry. ? The port is gently pinched between sterile gloves, and a needle is inserted into the port.  Only "non-coring" port needles should be used to access the port. Once the port is accessed, a blood return should be checked. This helps ensure that the port  is in the vein and is not clogged.  If your port needs to remain accessed for a constant infusion, a clear (transparent) bandage will be placed over the needle site. The bandage and needle will need to be changed every week, or as directed by your health care provider.  Keep the bandage covering the needle clean and dry. Do not get it wet. Follow your health care provider's instructions on how to take a shower or bath while the port is accessed.  If your port does not need to stay accessed, no bandage is needed over the port.  What is flushing? Flushing helps keep the port from getting clogged. Follow your health care provider's instructions on how and when to flush the port. Ports are usually flushed with saline solution or a medicine called heparin. The need for flushing will depend on how the port is used.  If the port is used for intermittent medicines or blood draws, the port will need to be flushed: ? After medicines have been given. ? After blood has been drawn. ? As part of routine maintenance.  If a constant infusion is running, the port may not need to be flushed.  How long will my port stay implanted? The port can stay in for as long as your health care provider thinks it is needed. When it is time for the port to come out, surgery will be   done to remove it. The procedure is similar to the one performed when the port was put in. When should I seek immediate medical care? When you have an implanted port, you should seek immediate medical care if:  You notice a bad smell coming from the incision site.  You have swelling, redness, or drainage at the incision site.  You have more swelling or pain at the port site or the surrounding area.  You have a fever that is not controlled with medicine.  This information is not intended to replace advice given to you by your health care provider. Make sure you discuss any questions you have with your health care provider. Document  Released: 05/22/2005 Document Revised: 10/28/2015 Document Reviewed: 01/27/2013 Elsevier Interactive Patient Education  2017 Elsevier Inc.  

## 2017-01-09 NOTE — Telephone Encounter (Signed)
appts made per 8/7 los and avs was printed for patient

## 2017-01-10 LAB — CA 125: Cancer Antigen (CA) 125: 9.1 U/mL (ref 0.0–38.1)

## 2017-01-11 ENCOUNTER — Encounter: Payer: Self-pay | Admitting: Hematology and Oncology

## 2017-01-11 NOTE — Progress Notes (Signed)
[place] OFFICE PROGRESS NOTE  Patient Care Team: Patient, No Pcp Per as PCP - General (General Practice)  SUMMARY OF ONCOLOGIC HISTORY:   Ovarian carcinoma ([place])   04/19/2015 Initial Diagnosis    In late 03-2015 she in [place] and was found to have large pelvic mass and perihepatic ascites. CA 125 then was 359, with CEA of 3, CA 19-9 5.8 and alpha fetoprotein 2.3. She had exploratory laparotomy 04-19-15, not done by gyn oncologist, with bilateral ovaried an tumors, 10 cm with capsule ruptured on right , smaller on left, peritoneal carcinomatosis.       05/20/2015 Miscellaneous    She had some bowel obstruction post operatively, with NG used . Pathology reportedcystic and solid left ovarian mass, 13 cm right ovarian mass, moderately differentiated tumor "tumor de celulas de la granulosa". 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 CT AP 05-2015 had ascites, carcinomatosis and multiple peritoneal implants. CA 125 on 05-24-15 baseline for chemo was 141. She received 3 cycles of taxol + carboplatin from 05-26-15 thru 07-07-15 (not clear if avastin given those cycles), after which carboplatin was not available in [place]. She received taxol 175 mg/m2, CDDP and avastin for 3 additional cycles thru 10-07-15. Second look laparotomy was discussed, patient declined, and instead had 4 cycles avastin + taxol from 12-01-15 thru 02-02-16 (apparentlyno CDDP after 10-07-15 and no carboplatin after 07-07-15). CT CAP 02-24-16 reportedly had no evidence of malignancy, with some bullous emphysema, tortuous sigmoid colon with diverticulosis. . Recommendation was for maintenance avastin x 1 year, however patient then came to  to be with family.       05/20/2016 Imaging    Extensive peritoneal disease with omental thickening/ 18 and large volume ascites. 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  Trace bilateral pleural effusions. Moderate hiatal hernia. Bilateral inguinal hernias containing fat. Left colonic diverticulosis. Aortoiliac atherosclerosis.      05/20/2016 - 06/13/2016 Hospital Admission    76 year old female with a history of metastatic ovarian cancer diagnosed in January 2017 (s/p primary debulking surgery followed by 8 adjuvant cycles of carboplatin and paclitaxel and 4 additional cycles of carboplatin, paclitaxel, avastin), last chemo Sept 2017, presented with progressive abdominal bloating and pain for 3 weeks duration. She was feeling well until the first week of December, 2017 when she was sick with a virus from which she never recovered. She states that she began feeling distended in the abdomen and uncomfortable and was taken to the ED on 05/20/16. CT chest/abd/pelvis9-21-17 reportedly with no evidence of malignancy, with some bullous emphysema, tortuous sigmoid colon with diverticulosis. Recommendation was for maintenance avastin x 1 year, however patient then came to [facility] in 02-2016. She has not established care with any physician since coming to [place]. Unfortunately, after paracentesis she began having increasing abdominal pain. Abdominal x-ray revealed distended small bowel loops concerning for small bowel obstruction. Gen. surgery was consulted. NG was inserted to suction. Because of prolonged Hospitalization she was placed on TPN/TNA. She underwent Chemotheray with Doxil on 12/26. Repeat U/S 12/31 showed interval progression of ascites and She is s/p repeat Paracentesis 06-06-16 which removed 4 liters. She was subsequently discharged      05/21/2016 Tumor Marker    Patient's tumor was tested for the following markers: CA125 Results of the tumor marker test revealed 249.8      05/22/2016 Procedure    Successful ultrasound-guided paracentesis yielding 2.9 liters of peritoneal fluid.  05/22/2016 Pathology Results    PERITONEAL/ASCITIC FLUID, 1 OF 1 COLLECTED ON  05/22/16: ADENOCARCINOMA. ADDENDUM: Immunohistochemistry is performed on the cell block and the tumor is strongly positive with cytokeratin 7 and cytokeratin 8/18 and shows patchy positivity with estrogen receptor, cytokeratin 5/6, WT-1 and MOC-31 with focal weak positivity with PAX-8. The tumor is negative with thyroglobulin, thyroid transcription factor-1, Napsin-A, progesterone receptor, GATA 3, gross cystic disease fluid protein, cytokeratin 20, CDX-2 and Calretinin. 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       05/24/2016 Procedure    Ultrasound and fluoroscopically guided right internal jugular single lumen power port catheter insertion. Tip in the SVC/RA junction. Catheter ready for use      05/24/2016 Imaging    LV EF: 65% -   70%      05/28/2016 Imaging    Small volume of abdominal and pelvic ascites is identified and inadequate for paracentesis.      05/30/2016 - 08/21/2016 Chemotherapy    She received doxorubicin x 3 cycles, stopped due to disease progression      06/04/2016 Imaging    Increased amount of ascites, freely distributed in all 4 quadrants. No other acute finding by ultrasound.      06/06/2016 Imaging    Small left greater than right pleural effusions, increased compared to prior CT. Interim development of partial consolidations within the bilateral lower lobes which may reflect atelectasis or pneumonia. Few scattered foci of ground-glass density within the right upper and left upper lobes, suspect small foci of inflammation or infection. 2. Moderate to large volume of ascites in the upper abdomen with extensive mesenteric and omental metastatic disease partially visualized. 3. Dilated gallbladder.      06/08/2016 Procedure    Successful ultrasound and fluoroscopic guided placement of a tunneled peritoneal drainage catheter.  2. Successful aspiration of 900 cc of serous ascites following tunneled peritoneal drainage catheter placement.      06/19/2016 Tumor Marker    Patient's tumor was tested for the following markers: CA125 Results of the tumor marker test revealed 166.6      07/24/2016 Tumor Marker    Patient's tumor was tested for the following markers: CA125 Results of the tumor marker test revealed 104.4      08/18/2016 Imaging    Extensive peritoneal metastatic disease within the abdomen. This is somewhat difficult to compare with prior given diffuse nature however there is suggestion of interval progression. Interval resolution of previously described bilateral pleural effusions. Peritoneal drainage catheter is present with tip terminating in the right upper quadrant. Interval decrease in now small volume ascites. Aortic atherosclerosis. Diverticulosis without CT evidence for acute diverticulitis.      08/18/2016 Imaging    LV EF: 60% -  65%      08/21/2016 Tumor Marker    Patient's tumor was tested for the following markers: CA125 Results of the tumor marker test revealed 96.1      08/29/2016 - 12/26/2016 Chemotherapy    She received gemzar and carboplatin      09/05/2016 Tumor Marker    Patient's tumor was tested for the following markers: CA125 Results of the tumor marker test revealed 48.1      09/20/2016 Procedure    Status post bedside removal of tunneled right-sided peritoneal drain which was removed in its entirety.      10/03/2016 Tumor Marker    Patient's tumor  was tested for the following markers: CA125 Results of the tumor marker test revealed 22.9      11/03/2016 Imaging    Interval decrease in peritoneal metastatic disease when compared with prior exam. Aortic atherosclerosis. Sigmoid colonic diverticulosis.      11/07/2016 Tumor Marker    Patient's tumor was tested for the following markers: CA125 Results of the tumor marker test revealed 9.6      12/05/2016 Tumor Marker     Patient's tumor was tested for the following markers: CA125 Results of the tumor marker test revealed 9.6      12/19/2016 Tumor Marker    Patient's tumor was tested for the following markers: CA-125 Results of the tumor marker test revealed 8.      01/08/2017 Imaging    1. Disease described previously in the central small bowel mesentery is now noted to be omental. Omental positioning is quite different between the 2 studies making reproducible measurement impossible, but the confluent soft tissue attenuation and bulkiness of this disease appears qualitatively decreased in the interval. Despite the improvement in the central omental disease, there is a new 1.6 cm left omental nodule towards the splenic flexure which cannot be identified on the previous study, concerning for new area of focal progression. 2. Trace free fluid in the pelvis. 3. Small groin hernias contain only fat. 4. Left colonic diverticulosis without diverticulitis. 5. Aortic Atherosclerois (ICD10-170.0)       INTERVAL HISTORY: Please see below for problem oriented charting. She returns to review test results She complain of fatigue from recent treatment Denies pain No nausea or vomiting Denies significant peripheral neuropathy  REVIEW OF SYSTEMS:   Constitutional: Denies fevers, chills or abnormal weight loss Eyes: Denies blurriness of vision Ears, nose, mouth, throat, and face: Denies mucositis or sore throat Respiratory: Denies cough, dyspnea or wheezes Cardiovascular: Denies palpitation, chest discomfort or lower extremity swelling Gastrointestinal:  Denies nausea, heartburn or change in bowel habits Skin: Denies abnormal skin rashes Lymphatics: Denies new lymphadenopathy or easy bruising Neurological:Denies numbness, tingling or new weaknesses Behavioral/Psych: Mood is stable, no new changes  All other systems were reviewed with the patient and are negative.  I have reviewed the past medical history, past  surgical history, social history and family history with the patient and they are unchanged from previous note.  ALLERGIES:  has No Known Allergies.  MEDICATIONS:  Current Outpatient Prescriptions  Medication Sig Dispense Refill  . acetaminophen (TYLENOL) 325 MG tablet Take 650 mg by mouth every 5 (five) hours as needed for moderate pain.    Marland Kitchen lidocaine-prilocaine (EMLA) cream Apply to port 1 hour before access with needle. Cover with plastic wrap. (Patient not taking: Reported on 11/07/2016) 30 g 11  . ondansetron (ZOFRAN ODT) 8 MG disintegrating tablet Take 1 tablet (8 mg total) by mouth every 8 (eight) hours as needed for nausea or vomiting. (Patient not taking: Reported on 11/07/2016) 30 tablet 0  . promethazine (PHENERGAN) 25 MG tablet Take 1 tablet (25 mg total) by mouth every 6 (six) hours as needed for nausea or vomiting. (Patient not taking: Reported on 11/07/2016) 30 tablet 9   No current facility-administered medications for this visit.     PHYSICAL EXAMINATION: ECOG PERFORMANCE STATUS: 1 - Symptomatic but completely ambulatory  Vitals:   01/09/17 1316  BP: 132/66  Pulse: 78  Resp: 17  Temp: 98.4 F (36.9 C)  SpO2: 100%   Filed Weights   01/09/17 1316  Weight: 165 lb 9.6 oz (75.1  kg)    GENERAL:alert, no distress and comfortable SKIN: skin color, texture, turgor are normal, no rashes or significant lesions EYES: normal, Conjunctiva are pink and non-injected, sclera clear OROPHARYNX:no exudate, no erythema and lips, buccal mucosa, and tongue normal  NECK: supple, thyroid normal size, non-tender, without nodularity LYMPH:  no palpable lymphadenopathy in the cervical, axillary or inguinal LUNGS: clear to auscultation and percussion with normal breathing effort HEART: regular rate & rhythm and no murmurs and no lower extremity edema ABDOMEN:abdomen soft, non-tender and normal bowel sounds Musculoskeletal:no cyanosis of digits and no clubbing  NEURO: alert & oriented x 3  with fluent speech, no focal motor/sensory deficits  LABORATORY DATA:  I have reviewed the data as listed    Component Value Date/Time   NA 136 01/09/2017 1245   K 3.9 01/09/2017 1245   CL 100 (L) 09/20/2016 1252   CO2 25 01/09/2017 1245   GLUCOSE 97 01/09/2017 1245   BUN 11.2 01/09/2017 1245   CREATININE 0.8 01/09/2017 1245   CALCIUM 9.1 01/09/2017 1245   PROT 6.9 01/09/2017 1245   ALBUMIN 3.7 01/09/2017 1245   AST 16 01/09/2017 1245   ALT 12 01/09/2017 1245   ALKPHOS 91 01/09/2017 1245   BILITOT 0.23 01/09/2017 1245   GFRNONAA >60 09/20/2016 1252   GFRAA >60 09/20/2016 1252    No results found for: SPEP, UPEP  Lab Results  Component Value Date   WBC 4.6 01/09/2017   NEUTROABS 2.0 01/09/2017   HGB 9.3 (L) 01/09/2017   HCT 28.9 (L) 01/09/2017   MCV 100.0 01/09/2017   PLT 235 01/09/2017      Chemistry      Component Value Date/Time   NA 136 01/09/2017 1245   K 3.9 01/09/2017 1245   CL 100 (L) 09/20/2016 1252   CO2 25 01/09/2017 1245   BUN 11.2 01/09/2017 1245   CREATININE 0.8 01/09/2017 1245      Component Value Date/Time   CALCIUM 9.1 01/09/2017 1245   ALKPHOS 91 01/09/2017 1245   AST 16 01/09/2017 1245   ALT 12 01/09/2017 1245   BILITOT 0.23 01/09/2017 1245       RADIOGRAPHIC STUDIES: I have reviewed imaging study with the patient and her daughter I have personally reviewed the radiological images as listed and agreed with the findings in the report. Ct Abdomen Pelvis W Contrast  Result Date: 01/08/2017 CLINICAL DATA:  Ovarian cancer. EXAM: CT ABDOMEN AND PELVIS WITH CONTRAST TECHNIQUE: Multidetector CT imaging of the abdomen and pelvis was performed using the standard protocol following bolus administration of intravenous contrast. CONTRAST:  186mL ISOVUE-300 IOPAMIDOL (ISOVUE-300) INJECTION 61% COMPARISON:  11/03/2016 FINDINGS: Lower chest:  Unremarkable. Hepatobiliary: No focal abnormality within the liver parenchyma. Gallbladder decompressed. No  intrahepatic or extrahepatic biliary dilation. Pancreas: No focal mass lesion. No dilatation of the main duct. No intraparenchymal cyst. No peripancreatic edema. Spleen: No splenomegaly. No focal mass lesion. Adrenals/Urinary Tract: No adrenal nodule or mass. Tiny low-density lesion interpolar left kidney is stable. Tiny low-density lesion lower pole right kidney also unchanged. No evidence for hydroureter. The urinary bladder appears normal for the degree of distention. Stomach/Bowel: Stomach is nondistended. No gastric wall thickening. No evidence of outlet obstruction. Duodenum is normally positioned as is the ligament of Treitz. No small bowel wall thickening. No small bowel dilatation. The terminal ileum is normal. The appendix is not visualized, but there is no edema or inflammation in the region of the cecum. Diverticular changes are noted in the left colon  without evidence of diverticulitis. Vascular/Lymphatic: There is abdominal aortic atherosclerosis without aneurysm. There is no gastrohepatic or hepatoduodenal ligament lymphadenopathy. No intraperitoneal or retroperitoneal lymphadenopathy. A more fist abnormal soft tissue in the omentum again noted. Differential omental positioning makes comparative measurement difficult, but the diffuse central omental disease seen on the prior study appears minimally less confluent today at also slightly less bulky. Despite the apparent interval improvement of the dominant omental disease, there is a nodule in the anterior left upper quadrant between the stomach and the distal transverse colon measuring 1.8 cm today in this nodule cannot be definitely identified on the prior study. Reproductive: Uterus surgically absent.  There is no adnexal mass. Other: Trace free fluid noted in the pelvis anteriorly, new in the interval. Musculoskeletal: Small groin hernias are identified bilaterally, containing only fat. Bone windows reveal no worrisome lytic or sclerotic osseous  lesions. IMPRESSION: 1. Disease described previously in the central small bowel mesentery is now noted to be omental. Omental positioning is quite different between the 2 studies making reproducible measurement impossible, but the confluent soft tissue attenuation and bulkiness of this disease appears qualitatively decreased in the interval. Despite the improvement in the central omental disease, there is a new 1.6 cm left omental nodule towards the splenic flexure which cannot be identified on the previous study, concerning for new area of focal progression. 2. Trace free fluid in the pelvis. 3. Small groin hernias contain only fat. 4. Left colonic diverticulosis without diverticulitis. 5.  Aortic Atherosclerois (ICD10-170.0) Electronically Signed   By: Misty Stanley M.D.   On: 01/08/2017 15:53    ASSESSMENT & PLAN:  Ovarian carcinoma ([place]) I reviewed the imaging study and blood work with the patient and her daughter Tumor marker has normalized over the last few months However, CT scan showed possible abnormal peritoneal nodule suspicious for residual disease We discussed continuation of treatment but the patient is reluctant She cannot afford PARP inhibitor I am concerned about prescribing Avastin in the setting of peritoneal disease for fear of bowel obstruction or GI perforation After extensive discussion, we are in agreement to take a chemotherapy holiday I will bring her back in a few weeks with blood work, history physical examination and tumor marker monitoring I plan to repeat imaging study again in 2 months  Anemia due to antineoplastic chemotherapy This is likely due to recent treatment. The patient denies recent history of bleeding such as epistaxis, hematuria or hematochezia. She is asymptomatic from the anemia. I will observe for now.  Goals of care, counseling/discussion We have extensive discussion about goals of care Ultimately, the patient would like to take a break to recover  from recent treatment which I think is reasonable. I plan to see her back in a few weeks for further care   No orders of the defined types were placed in this encounter.  All questions were answered. The patient knows to call the clinic with any problems, questions or concerns. No barriers to learning was detected. I spent 15 minutes counseling the patient face to face. The total time spent in the appointment was 20 minutes and more than 50% was on counseling and review of test results     Heath Lark, MD 01/11/2017 2:36 PM

## 2017-01-11 NOTE — Assessment & Plan Note (Signed)
We have extensive discussion about goals of care Ultimately, the patient would like to take a break to recover from recent treatment which I think is reasonable. I plan to see her back in a few weeks for further care

## 2017-01-11 NOTE — Assessment & Plan Note (Signed)
This is likely due to recent treatment. The patient denies recent history of bleeding such as epistaxis, hematuria or hematochezia. She is asymptomatic from the anemia. I will observe for now.   

## 2017-01-11 NOTE — Assessment & Plan Note (Addendum)
I reviewed the imaging study and blood work with the patient and her daughter Tumor marker has normalized over the last few months However, CT scan showed possible abnormal peritoneal nodule suspicious for residual disease We discussed continuation of treatment but the patient is reluctant She cannot afford PARP inhibitor I am concerned about prescribing Avastin in the setting of peritoneal disease for fear of bowel obstruction or GI perforation After extensive discussion, we are in agreement to take a chemotherapy holiday I will bring her back in a few weeks with blood work, history physical examination and tumor marker monitoring I plan to repeat imaging study again in 2 months

## 2017-01-16 ENCOUNTER — Ambulatory Visit: Payer: Self-pay

## 2017-01-16 ENCOUNTER — Other Ambulatory Visit: Payer: Self-pay

## 2017-02-06 ENCOUNTER — Telehealth: Payer: Self-pay

## 2017-02-06 ENCOUNTER — Telehealth: Payer: Self-pay | Admitting: *Deleted

## 2017-02-06 NOTE — Telephone Encounter (Signed)
S/w daughter per Dr Alvy Bimler. inbasket sent labs 230, MD at 300

## 2017-02-06 NOTE — Telephone Encounter (Signed)
Dr Alvy Bimler placed pt on chemo holiday. A couple days ago, she feels pain in her belly. When pressure on belly she feels "inflammation". Like a gas pain. Having BM daily. She is eating OK. Belly is bloated. She burps more than passes gas. No fever. Feels more tired. Pain is almost constant. She has not used any heat or medications for the pain. No diarrhea,  no constipation. Uses stool softener every day.  Next appt is 10/1. (scheduled with Dr Burr Medico) Requesting appt much sooner.

## 2017-02-06 NOTE — Telephone Encounter (Signed)
The scheduling with Dr. Burr Medico is a major clinical error. I have cancelled all the appointment next month I can see her at 3 pm tomorrow (wednesday) with labs to be done before I see her. If it works well, please send urgent scheduling. Will need interpreter

## 2017-02-06 NOTE — Telephone Encounter (Signed)
Spoke with daughter. They will come in tomorrow for labs and see Dr Alvy Bimler

## 2017-02-07 ENCOUNTER — Other Ambulatory Visit (HOSPITAL_BASED_OUTPATIENT_CLINIC_OR_DEPARTMENT_OTHER): Payer: Self-pay

## 2017-02-07 ENCOUNTER — Ambulatory Visit (HOSPITAL_COMMUNITY)
Admission: RE | Admit: 2017-02-07 | Discharge: 2017-02-07 | Disposition: A | Discharge status: Home / Self Care | Payer: Self-pay | Source: Ambulatory Visit | Attending: Hematology and Oncology | Admitting: Hematology and Oncology

## 2017-02-07 ENCOUNTER — Ambulatory Visit (HOSPITAL_BASED_OUTPATIENT_CLINIC_OR_DEPARTMENT_OTHER): Payer: Self-pay | Admitting: Hematology and Oncology

## 2017-02-07 VITALS — BP 151/83 | HR 84 | Temp 98.2°F | Resp 18 | Ht 63.0 in | Wt 168.8 lb

## 2017-02-07 DIAGNOSIS — R300 Dysuria: Secondary | ICD-10-CM

## 2017-02-07 DIAGNOSIS — Z7189 Other specified counseling: Secondary | ICD-10-CM

## 2017-02-07 DIAGNOSIS — C569 Malignant neoplasm of unspecified ovary: Secondary | ICD-10-CM

## 2017-02-07 DIAGNOSIS — G893 Neoplasm related pain (acute) (chronic): Secondary | ICD-10-CM

## 2017-02-07 DIAGNOSIS — C801 Malignant (primary) neoplasm, unspecified: Secondary | ICD-10-CM

## 2017-02-07 DIAGNOSIS — C786 Secondary malignant neoplasm of retroperitoneum and peritoneum: Secondary | ICD-10-CM

## 2017-02-07 DIAGNOSIS — R140 Abdominal distension (gaseous): Secondary | ICD-10-CM

## 2017-02-07 LAB — URINALYSIS, MICROSCOPIC - CHCC
BLOOD: NEGATIVE
Bilirubin (Urine): NEGATIVE
GLUCOSE UR CHCC: NEGATIVE mg/dL
Ketones: NEGATIVE mg/dL
Nitrite: NEGATIVE
PH: 8 (ref 4.6–8.0)
PROTEIN: NEGATIVE mg/dL
SPECIFIC GRAVITY, URINE: 1.005 (ref 1.003–1.035)
Urobilinogen, UR: 0.2 mg/dL (ref 0.2–1)

## 2017-02-07 LAB — CBC WITH DIFFERENTIAL/PLATELET
BASO%: 0.2 % (ref 0.0–2.0)
Basophils Absolute: 0 10*3/uL (ref 0.0–0.1)
EOS ABS: 0 10*3/uL (ref 0.0–0.5)
EOS%: 0.4 % (ref 0.0–7.0)
HEMATOCRIT: 33.2 % — AB (ref 34.8–46.6)
HGB: 10.7 g/dL — ABNORMAL LOW (ref 11.6–15.9)
LYMPH#: 1.9 10*3/uL (ref 0.9–3.3)
LYMPH%: 22.8 % (ref 14.0–49.7)
MCH: 31.2 pg (ref 25.1–34.0)
MCHC: 32.2 g/dL (ref 31.5–36.0)
MCV: 96.8 fL (ref 79.5–101.0)
MONO#: 1 10*3/uL — AB (ref 0.1–0.9)
MONO%: 12.5 % (ref 0.0–14.0)
NEUT%: 64.1 % (ref 38.4–76.8)
NEUTROS ABS: 5.4 10*3/uL (ref 1.5–6.5)
PLATELETS: 356 10*3/uL (ref 145–400)
RBC: 3.43 10*6/uL — ABNORMAL LOW (ref 3.70–5.45)
RDW: 14.6 % — ABNORMAL HIGH (ref 11.2–14.5)
WBC: 8.3 10*3/uL (ref 3.9–10.3)

## 2017-02-07 LAB — COMPREHENSIVE METABOLIC PANEL
ALBUMIN: 3.4 g/dL — AB (ref 3.5–5.0)
ALK PHOS: 91 U/L (ref 40–150)
ALT: 6 U/L (ref 0–55)
ANION GAP: 8 meq/L (ref 3–11)
AST: 14 U/L (ref 5–34)
BILIRUBIN TOTAL: 0.43 mg/dL (ref 0.20–1.20)
BUN: 12.9 mg/dL (ref 7.0–26.0)
CALCIUM: 9.6 mg/dL (ref 8.4–10.4)
CO2: 27 meq/L (ref 22–29)
CREATININE: 0.9 mg/dL (ref 0.6–1.1)
Chloride: 99 mEq/L (ref 98–109)
EGFR: 64 mL/min/{1.73_m2} — AB (ref >90)
Glucose: 97 mg/dl (ref 70–140)
Potassium: 4.2 mEq/L (ref 3.5–5.1)
Sodium: 134 mEq/L — ABNORMAL LOW (ref 136–145)
TOTAL PROTEIN: 7.3 g/dL (ref 6.4–8.3)

## 2017-02-08 ENCOUNTER — Telehealth: Payer: Self-pay | Admitting: *Deleted

## 2017-02-08 ENCOUNTER — Encounter: Payer: Self-pay | Admitting: Hematology and Oncology

## 2017-02-08 DIAGNOSIS — R300 Dysuria: Secondary | ICD-10-CM | POA: Insufficient documentation

## 2017-02-08 LAB — CA 125: CANCER ANTIGEN (CA) 125: 98.6 U/mL — AB (ref 0.0–38.1)

## 2017-02-08 LAB — URINE CULTURE

## 2017-02-08 NOTE — Progress Notes (Signed)
[facility] OFFICE PROGRESS NOTE  Patient Care Team: Patient, No Pcp Per as PCP - General (General Practice)  SUMMARY OF ONCOLOGIC HISTORY:   Ovarian carcinoma ([place])   04/19/2015 Initial Diagnosis    In late 03-2015 she in [place] and was found to have large pelvic mass and perihepatic ascites. CA 125 then was 359, with CEA of 3, CA 19-9 5.8 and alpha fetoprotein 2.3. She had exploratory laparotomy 04-19-15, not done by gyn oncologist, with bilateral ovaried an tumors, 10 cm with capsule ruptured on right , smaller on left, peritoneal carcinomatosis.       05/20/2015 Miscellaneous    She had some bowel obstruction post operatively, with NG used . Pathology reportedcystic and solid left ovarian mass, 13 cm right ovarian mass, moderately differentiated tumor "tumor de celulas de la granulosa". 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 CT AP 05-2015 had ascites, carcinomatosis and multiple peritoneal implants. CA 125 on 05-24-15 baseline for chemo was 141. She received 3 cycles of taxol + carboplatin from 05-26-15 thru 07-07-15 (not clear if avastin given those cycles), after which carboplatin was not available in [place]. She received taxol 175 mg/m2, CDDP and avastin for 3 additional cycles thru 10-07-15. Second look laparotomy was discussed, patient declined, and instead had 4 cycles avastin + taxol from 12-01-15 thru 02-02-16 (apparentlyno CDDP after 10-07-15 and no carboplatin after 07-07-15). CT CAP 02-24-16 reportedly had no evidence of malignancy, with some bullous emphysema, tortuous sigmoid colon with diverticulosis. . Recommendation was for maintenance avastin x 1 year, however patient then came to [place] to be with family.       05/20/2016 Imaging    Extensive peritoneal disease with omental thickening/ 18 and large volume ascites. 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  Trace bilateral pleural effusions. Moderate hiatal hernia. Bilateral inguinal hernias containing fat. Left colonic diverticulosis. Aortoiliac atherosclerosis.      05/20/2016 - 06/13/2016 Hospital Admission    76 year old female with a history of metastatic ovarian cancer diagnosed in January 2017 (s/p primary debulking surgery followed by 8 adjuvant cycles of carboplatin and paclitaxel and 4 additional cycles of carboplatin, paclitaxel, avastin), last chemo Sept 2017, presented with progressive abdominal bloating and pain for 3 weeks duration. She was feeling well until the first week of December, 2017 when she was sick with a virus from which she never recovered. She states that she began feeling distended in the abdomen and uncomfortable and was taken to the ED on 05/20/16. CT chest/abd/pelvis9-21-17 reportedly with no evidence of malignancy, with some bullous emphysema, tortuous sigmoid colon with diverticulosis. Recommendation was for maintenance avastin x 1 year, however patient then came to [facility] in 02-2016. She has not established care with any physician since coming to [place]. Unfortunately, after paracentesis she began having increasing abdominal pain. Abdominal x-ray revealed distended small bowel loops concerning for small bowel obstruction. Gen. surgery was consulted. NG was inserted to suction. Because of prolonged Hospitalization she was placed on TPN/TNA. She underwent Chemotheray with Doxil on 12/26. Repeat U/S 12/31 showed interval progression of ascites and She is s/p repeat Paracentesis 06-06-16 which removed 4 liters. She was subsequently discharged      05/21/2016 Tumor Marker    Patient's tumor was tested for the following markers: CA125 Results of the tumor marker test revealed 249.8      05/22/2016 Procedure    Successful ultrasound-guided paracentesis yielding 2.9 liters of peritoneal fluid.  05/22/2016 Pathology Results    PERITONEAL/ASCITIC FLUID, 1 OF 1 COLLECTED ON  05/22/16: ADENOCARCINOMA. ADDENDUM: Immunohistochemistry is performed on the cell block and the tumor is strongly positive with cytokeratin 7 and cytokeratin 8/18 and shows patchy positivity with estrogen receptor, cytokeratin 5/6, WT-1 and MOC-31 with focal weak positivity with PAX-8. The tumor is negative with thyroglobulin, thyroid transcription factor-1, Napsin-A, progesterone receptor, GATA 3, gross cystic disease fluid protein, cytokeratin 20, CDX-2 and Calretinin. 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       05/24/2016 Procedure    Ultrasound and fluoroscopically guided right internal jugular single lumen power port catheter insertion. Tip in the SVC/RA junction. Catheter ready for use      05/24/2016 Imaging    LV EF: 65% -   70%      05/28/2016 Imaging    Small volume of abdominal and pelvic ascites is identified and inadequate for paracentesis.      05/30/2016 - 08/21/2016 Chemotherapy    She received doxorubicin x 3 cycles, stopped due to disease progression      06/04/2016 Imaging    Increased amount of ascites, freely distributed in all 4 quadrants. No other acute finding by ultrasound.      06/06/2016 Imaging    Small left greater than right pleural effusions, increased compared to prior CT. Interim development of partial consolidations within the bilateral lower lobes which may reflect atelectasis or pneumonia. Few scattered foci of ground-glass density within the right upper and left upper lobes, suspect small foci of inflammation or infection. 2. Moderate to large volume of ascites in the upper abdomen with extensive mesenteric and omental metastatic disease partially visualized. 3. Dilated gallbladder.      06/08/2016 Procedure    Successful ultrasound and fluoroscopic guided placement of a tunneled peritoneal drainage catheter.  2. Successful aspiration of 900 cc of serous ascites following tunneled peritoneal drainage catheter placement.      06/19/2016 Tumor Marker    Patient's tumor was tested for the following markers: CA125 Results of the tumor marker test revealed 166.6      07/24/2016 Tumor Marker    Patient's tumor was tested for the following markers: CA125 Results of the tumor marker test revealed 104.4      08/18/2016 Imaging    Extensive peritoneal metastatic disease within the abdomen. This is somewhat difficult to compare with prior given diffuse nature however there is suggestion of interval progression. Interval resolution of previously described bilateral pleural effusions. Peritoneal drainage catheter is present with tip terminating in the right upper quadrant. Interval decrease in now small volume ascites. Aortic atherosclerosis. Diverticulosis without CT evidence for acute diverticulitis.      08/18/2016 Imaging    LV EF: 60% -  65%      08/21/2016 Tumor Marker    Patient's tumor was tested for the following markers: CA125 Results of the tumor marker test revealed 96.1      08/29/2016 - 12/26/2016 Chemotherapy    She received gemzar and carboplatin      09/05/2016 Tumor Marker    Patient's tumor was tested for the following markers: CA125 Results of the tumor marker test revealed 48.1      09/20/2016 Procedure    Status post bedside removal of tunneled right-sided peritoneal drain which was removed in its entirety.      10/03/2016 Tumor Marker    Patient's tumor  was tested for the following markers: CA125 Results of the tumor marker test revealed 22.9      11/03/2016 Imaging    Interval decrease in peritoneal metastatic disease when compared with prior exam. Aortic atherosclerosis. Sigmoid colonic diverticulosis.      11/07/2016 Tumor Marker    Patient's tumor was tested for the following markers: CA125 Results of the tumor marker test revealed 9.6      12/05/2016 Tumor Marker     Patient's tumor was tested for the following markers: CA125 Results of the tumor marker test revealed 9.6      12/19/2016 Tumor Marker    Patient's tumor was tested for the following markers: CA-125 Results of the tumor marker test revealed 8.      01/08/2017 Imaging    1. Disease described previously in the central small bowel mesentery is now noted to be omental. Omental positioning is quite different between the 2 studies making reproducible measurement impossible, but the confluent soft tissue attenuation and bulkiness of this disease appears qualitatively decreased in the interval. Despite the improvement in the central omental disease, there is a new 1.6 cm left omental nodule towards the splenic flexure which cannot be identified on the previous study, concerning for new area of focal progression. 2. Trace free fluid in the pelvis. 3. Small groin hernias contain only fat. 4. Left colonic diverticulosis without diverticulitis. 5. Aortic Atherosclerois (ICD10-170.0)      02/07/2017 Tumor Marker    Patient's tumor was tested for the following markers: CA125 Results of the tumor marker test revealed 98.6      02/07/2017 Imaging    1. Normal bowel gas pattern. 2. Stool distends the ascending colon. No generalized stool retention or rectal impaction       INTERVAL HISTORY: Please see below for problem oriented charting. She returns with her son and daughter for further follow-up Her son serves as an interpreter Starting about a week and a half ago, she complained of new onset of weakness She described right lower quadrant pain that comes and goes that she described as a form of inflammation/burning sensation She denies nausea She also have mild symptoms of dysuria She says her bowel habits have been erratic and she uses laxatives intermittently With symptoms of bloating, she started to take Gas-X She also have intermittent acid reflux She had passage of frequent bowel movement  with liquid stool She denies blood in her stool  REVIEW OF SYSTEMS:   Constitutional: Denies fevers, chills or abnormal weight loss Eyes: Denies blurriness of vision Ears, nose, mouth, throat, and face: Denies mucositis or sore throat Respiratory: Denies cough, dyspnea or wheezes Cardiovascular: Denies palpitation, chest discomfort or lower extremity swelling Skin: Denies abnormal skin rashes Lymphatics: Denies new lymphadenopathy or easy bruising Behavioral/Psych: Mood is stable, no new changes  All other systems were reviewed with the patient and are negative.  I have reviewed the past medical history, past surgical history, social history and family history with the patient and they are unchanged from previous note.  ALLERGIES:  has No Known Allergies.  MEDICATIONS:  Current Outpatient Prescriptions  Medication Sig Dispense Refill  . acetaminophen (TYLENOL) 325 MG tablet Take 650 mg by mouth every 5 (five) hours as needed for moderate pain.    Marland Kitchen lidocaine-prilocaine (EMLA) cream Apply to port 1 hour before access with needle. Cover with plastic wrap. (Patient not taking: Reported on 11/07/2016) 30 g 11  . ondansetron (ZOFRAN ODT) 8 MG disintegrating tablet Take 1 tablet (  8 mg total) by mouth every 8 (eight) hours as needed for nausea or vomiting. (Patient not taking: Reported on 11/07/2016) 30 tablet 0  . promethazine (PHENERGAN) 25 MG tablet Take 1 tablet (25 mg total) by mouth every 6 (six) hours as needed for nausea or vomiting. (Patient not taking: Reported on 11/07/2016) 30 tablet 9   No current facility-administered medications for this visit.     PHYSICAL EXAMINATION: ECOG PERFORMANCE STATUS: 1 - Symptomatic but completely ambulatory  Vitals:   02/07/17 1452  BP: (!) 151/83  Pulse: 84  Resp: 18  Temp: 98.2 F (36.8 C)  SpO2: 100%   Filed Weights   02/07/17 1452  Weight: 168 lb 12.8 oz (76.6 kg)    GENERAL:alert, no distress and comfortable.  She looks miserable and  uncomfortable  SKIN: skin color, texture, turgor are normal, no rashes or significant lesions EYES: normal, Conjunctiva are pink and non-injected, sclera clear OROPHARYNX:no exudate, no erythema and lips, buccal mucosa, and tongue normal  NECK: supple, thyroid normal size, non-tender, without nodularity LYMPH:  no palpable lymphadenopathy in the cervical, axillary or inguinal LUNGS: clear to auscultation and percussion with normal breathing effort HEART: regular rate & rhythm and no murmurs and no lower extremity edema ABDOMEN:abdomen soft, appeared distended with presence of ascites, reduced bowel sounds  musculoskeletal:no cyanosis of digits and no clubbing  NEURO: alert & oriented x 3 with fluent speech, no focal motor/sensory deficits  LABORATORY DATA:  I have reviewed the data as listed    Component Value Date/Time   NA 134 (L) 02/07/2017 1443   K 4.2 02/07/2017 1443   CL 100 (L) 09/20/2016 1252   CO2 27 02/07/2017 1443   GLUCOSE 97 02/07/2017 1443   BUN 12.9 02/07/2017 1443   CREATININE 0.9 02/07/2017 1443   CALCIUM 9.6 02/07/2017 1443   PROT 7.3 02/07/2017 1443   ALBUMIN 3.4 (L) 02/07/2017 1443   AST 14 02/07/2017 1443   ALT 6 02/07/2017 1443   ALKPHOS 91 02/07/2017 1443   BILITOT 0.43 02/07/2017 1443   GFRNONAA >60 09/20/2016 1252   GFRAA >60 09/20/2016 1252    No results found for: SPEP, UPEP  Lab Results  Component Value Date   WBC 8.3 02/07/2017   NEUTROABS 5.4 02/07/2017   HGB 10.7 (L) 02/07/2017   HCT 33.2 (L) 02/07/2017   MCV 96.8 02/07/2017   PLT 356 02/07/2017      Chemistry      Component Value Date/Time   NA 134 (L) 02/07/2017 1443   K 4.2 02/07/2017 1443   CL 100 (L) 09/20/2016 1252   CO2 27 02/07/2017 1443   BUN 12.9 02/07/2017 1443   CREATININE 0.9 02/07/2017 1443      Component Value Date/Time   CALCIUM 9.6 02/07/2017 1443   ALKPHOS 91 02/07/2017 1443   AST 14 02/07/2017 1443   ALT 6 02/07/2017 1443   BILITOT 0.43 02/07/2017 1443        RADIOGRAPHIC STUDIES: I have personally reviewed the radiological images as listed and agreed with the findings in the report. Dg Abd 2 Views  Result Date: 02/07/2017 CLINICAL DATA:  Peritoneal carcinomatosis.  Abdominal distention. EXAM: ABDOMEN - 2 VIEW COMPARISON:  CT from 01/08/2017 FINDINGS: Normal bowel gas pattern. No abnormal stool retention, with stool mainly seen in the proximal colon. No concerning mass effect or gas collection. No acute osseous finding. IMPRESSION: 1. Normal bowel gas pattern. 2. Stool distends the ascending colon. No generalized stool retention or rectal impaction.  Electronically Signed   By: Monte Fantasia M.D.   On: 02/07/2017 16:16    ASSESSMENT & PLAN:  Ovarian carcinoma ([facility]) Her symptoms are highly suggestive of cancer recurrence At the time of dictation, tumor markers came back profoundly elevated This is likely related to recent treatment interruption I will order CT scan to restage her disease and bring her back next week to discuss treatment options In the meantime, I recommend she continues taking laxative daily to avoid bowel obstruction Depending of the amount of fluid noticed on CT scan, she may or may not need therapeutic paracentesis  Cancer associated pain We discussed the roles of taking pain medicine as needed The patient has declined prescription for narcotic therapy  Abdominal bloating This is due to disease relapse X-ray of the abdomen did not reveal evidence of bowel obstruction, rather, showed evidence of fecal loading I recommend her to continue laxative therapy  Goals of care, counseling/discussion I have extensive discussion with the patient and family members regarding the goals of care and the rationale of pursuing additional imaging study We will further discuss treatment options in our next visit  Dysuria She had mild dysuria I will order urinalysis and urine culture to exclude infection   Orders Placed This  Encounter  Procedures  . Urine Culture    Standing Status:   Future    Number of Occurrences:   1    Standing Expiration Date:   03/14/2018  . DG Abd 2 Views    Standing Status:   Future    Number of Occurrences:   1    Standing Expiration Date:   03/14/2018    Order Specific Question:   Reason for exam:    Answer:   ovarian ca, abdominal distension, pain, exclude bowel obstruction    Order Specific Question:   Preferred imaging location?    Answer:   [facility]    Standing Status:   Future    Standing Expiration Date:   02/08/2018    Order Specific Question:   If indicated for the ordered procedure, I authorize the administration of contrast media per Radiology protocol    Answer:   Yes    Order Specific Question:   Preferred imaging location?    Answer:   [facility]    Order Specific Question:   Radiology Contrast Protocol - do NOT remove file path    Answer:   \\[email or web address]  . CT ABDOMEN PELVIS W CONTRAST    Standing Status:   Future    Standing Expiration Date:   02/08/2018    Order Specific Question:   If indicated for the ordered procedure, I authorize the administration of contrast media per Radiology protocol    Answer:   Yes    Order Specific Question:   Preferred imaging location?    Answer:   [facility]    Order Specific Question:   Radiology Contrast Protocol - do NOT remove file path    Answer:   \\[email or web address]  . Urinalysis, Microscopic - [facility]    Standing Status:   Future    Number of Occurrences:   1    Standing Expiration Date:   03/14/2018   All questions were answered. The patient knows to call the clinic with any problems, questions or concerns. No barriers to learning was detected. I spent 40 minutes counseling the patient face to face. The total time spent in the appointment was 49  minutes and more than 50% was on counseling and review of test  results     Heath Lark, MD 02/08/2017 7:44 AM

## 2017-02-08 NOTE — Assessment & Plan Note (Signed)
Her symptoms are highly suggestive of cancer recurrence At the time of dictation, tumor markers came back profoundly elevated This is likely related to recent treatment interruption I will order CT scan to restage her disease and bring her back next week to discuss treatment options In the meantime, I recommend she continues taking laxative daily to avoid bowel obstruction Depending of the amount of fluid noticed on CT scan, she may or may not need therapeutic paracentesis

## 2017-02-08 NOTE — Assessment & Plan Note (Signed)
I have extensive discussion with the patient and family members regarding the goals of care and the rationale of pursuing additional imaging study We will further discuss treatment options in our next visit

## 2017-02-08 NOTE — Assessment & Plan Note (Signed)
We discussed the roles of taking pain medicine as needed The patient has declined prescription for narcotic therapy

## 2017-02-08 NOTE — Telephone Encounter (Signed)
Per Dr. Alvy Bimler, I spoke with daughter, Joellen Jersey, and informed her that the Xray showed no bowel obstruction. The labs were not good. Dr. Alvy Bimler is going to order a CT scan for next week. Katie verbalized understanding of new appointments being scheduled.

## 2017-02-08 NOTE — Telephone Encounter (Signed)
"  Received message from Dr. Alvy Bimler that she wanted to start with me about my mom's results but have been unable to reach her.  Someone dialing for me because I'm legally blind.  Best return number is [phone]."  Routing call information to collaborative nurse and provider for review.  Further patient communication through collaborative nurse.

## 2017-02-08 NOTE — Assessment & Plan Note (Signed)
She had mild dysuria I will order urinalysis and urine culture to exclude infection

## 2017-02-08 NOTE — Assessment & Plan Note (Signed)
This is due to disease relapse X-ray of the abdomen did not reveal evidence of bowel obstruction, rather, showed evidence of fecal loading I recommend her to continue laxative therapy

## 2017-02-12 ENCOUNTER — Telehealth: Payer: Self-pay | Admitting: *Deleted

## 2017-02-12 NOTE — Telephone Encounter (Signed)
Daughter states her mother is getting worse- "belly is full of water, doesn't want to eat, doesn't move off of couch and is having to use pain medicine".  Dr Alvy Bimler wants her to go to the ED. Verbalized understanding

## 2017-02-13 ENCOUNTER — Emergency Department (HOSPITAL_COMMUNITY): Payer: Self-pay

## 2017-02-13 ENCOUNTER — Other Ambulatory Visit: Payer: Self-pay | Admitting: Hematology and Oncology

## 2017-02-13 ENCOUNTER — Emergency Department (HOSPITAL_COMMUNITY)
Admission: EM | Admit: 2017-02-13 | Discharge: 2017-02-13 | Disposition: A | Discharge status: Home / Self Care | Payer: Self-pay | Attending: Emergency Medicine | Admitting: Emergency Medicine

## 2017-02-13 ENCOUNTER — Encounter (HOSPITAL_COMMUNITY): Payer: Self-pay | Admitting: Emergency Medicine

## 2017-02-13 DIAGNOSIS — R188 Other ascites: Secondary | ICD-10-CM | POA: Insufficient documentation

## 2017-02-13 DIAGNOSIS — Z8543 Personal history of malignant neoplasm of ovary: Secondary | ICD-10-CM | POA: Insufficient documentation

## 2017-02-13 LAB — COMPREHENSIVE METABOLIC PANEL
ALBUMIN: 3.4 g/dL — AB (ref 3.5–5.0)
ALT: 9 U/L — ABNORMAL LOW (ref 14–54)
ANION GAP: 12 (ref 5–15)
AST: 16 U/L (ref 15–41)
Alkaline Phosphatase: 84 U/L (ref 38–126)
BILIRUBIN TOTAL: 0.2 mg/dL — AB (ref 0.3–1.2)
BUN: 18 mg/dL (ref 6–20)
CALCIUM: 9.1 mg/dL (ref 8.9–10.3)
CO2: 24 mmol/L (ref 22–32)
Chloride: 98 mmol/L — ABNORMAL LOW (ref 101–111)
Creatinine, Ser: 0.81 mg/dL (ref 0.44–1.00)
GFR calc Af Amer: >60 mL/min (ref >60)
GFR calc non Af Amer: >60 mL/min (ref >60)
GLUCOSE: 169 mg/dL — AB (ref 65–99)
Potassium: 3.5 mmol/L (ref 3.5–5.1)
SODIUM: 134 mmol/L — AB (ref 135–145)
TOTAL PROTEIN: 7.1 g/dL (ref 6.5–8.1)

## 2017-02-13 LAB — CBC
HCT: 32 % — ABNORMAL LOW (ref 36.0–46.0)
HEMOGLOBIN: 10.4 g/dL — AB (ref 12.0–15.0)
MCH: 29.9 pg (ref 26.0–34.0)
MCHC: 32.5 g/dL (ref 30.0–36.0)
MCV: 92 fL (ref 78.0–100.0)
PLATELETS: 416 10*3/uL — AB (ref 150–400)
RBC: 3.48 MIL/uL — ABNORMAL LOW (ref 3.87–5.11)
RDW: 14.6 % (ref 11.5–15.5)
WBC: 9.3 10*3/uL (ref 4.0–10.5)

## 2017-02-13 LAB — LIPASE, BLOOD: Lipase: 26 U/L (ref 11–51)

## 2017-02-13 MED ORDER — HYDROMORPHONE HCL 1 MG/ML IJ SOLN
1.0000 mg | Freq: Once | INTRAMUSCULAR | Status: DC
  Filled 2017-02-13: qty 1

## 2017-02-13 MED ORDER — IOPAMIDOL (ISOVUE-300) INJECTION 61%
INTRAVENOUS | Status: AC
  Filled 2017-02-13: qty 100

## 2017-02-13 MED ORDER — IOPAMIDOL (ISOVUE-300) INJECTION 61%
100.0000 mL | Freq: Once | INTRAVENOUS | Status: AC | PRN
  Administered 2017-02-13: 100 mL via INTRAVENOUS

## 2017-02-13 MED ORDER — ONDANSETRON HCL 4 MG/2ML IJ SOLN
4.0000 mg | Freq: Once | INTRAMUSCULAR | Status: DC
  Filled 2017-02-13: qty 2

## 2017-02-13 MED ORDER — SODIUM CHLORIDE 0.9 % IV BOLUS (SEPSIS)
1000.0000 mL | Freq: Once | INTRAVENOUS | Status: AC
  Administered 2017-02-13: 1000 mL via INTRAVENOUS

## 2017-02-13 NOTE — ED Provider Notes (Signed)
Patient signed out to me by Dr. Ashok Cordia pinning a therapeutic paracentesis. I was a former radiology that they will not be able to do this today and that this could be performed tomorrow morning at 9 AM. I have the patient and her daughter of this and they will return tomorrow to outpatient radiology for her therapeutic paracentesis   Audrey Leigh, MD 02/13/17 1656

## 2017-02-13 NOTE — ED Triage Notes (Signed)
Patient has abd cancer per family and stopped chemo 6 weeks ago per family. Patient been having abd pain for couple weeks that has gotten worse in past few days.

## 2017-02-13 NOTE — Discharge Instructions (Signed)
Go to outpatient radiology tomorrow morning at 8:45 AM to have the fluid removed from your abdomen

## 2017-02-13 NOTE — ED Notes (Signed)
Patient has prescription oxycodone 5mg  that patient only takes at night which helps with pain but doesn't take during the day. RN ask family why patient not taking during the day because wrote for every 4 hours as needed for pain but patient doesn't want to take medications.

## 2017-02-13 NOTE — ED Notes (Addendum)
Pt stated she has abdominal cancer. Pt is wanting to do chemo again from first cancer before it met. Pt symptoms began about 2 weeks ago and gradually got worse. Pts daughter is at bedside and also acts as interpreter.

## 2017-02-13 NOTE — ED Provider Notes (Addendum)
[facility] DEPT Provider Note   CSN: [id number] Arrival date & time: 02/13/17  5956     History   Chief Complaint Chief Complaint  Patient presents with  . Abdominal Pain    HPI Camira Geidel is a 76 y.o. female.  Patient with hx ovarian cancer, peritoneal mets, present w progressive abd pain in the past week. Takes oxycodone at home but not helping. Pain constant, dull, severe. Nausea, and poor po intake. No diarrhea. Did have bm today. States is scheduled for f/u staging cts, but cant wait, and is in too much pain. No fevers.    The history is provided by the patient and a relative. A language interpreter was used.  Abdominal Pain   Pertinent negatives include fever, dysuria and headaches.    Past Medical History:  Diagnosis Date  . Cancer [facility])     Patient Active Problem List   Diagnosis Date Noted  . Dysuria 02/08/2017  . Chemotherapy-induced nausea 10/05/2016  . Anemia due to antineoplastic chemotherapy 09/05/2016  . Abdominal bloating 08/22/2016  . Cancer associated pain 08/22/2016  . Goals of care, counseling/discussion 08/22/2016  . 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 Diverticulosis of colon 06/21/2016  . Physical deconditioning 06/21/2016  . Iron deficiency anemia due to chronic blood loss 06/21/2016  . Aortic atherosclerosis ([place]) 06/21/2016  . Portacath in place 06/19/2016  . Palliative care encounter   . Peritoneal carcinomatosis ([place])   . Gastroesophageal reflux disease   . Partial small bowel obstruction ([place]) 05/24/2016  . Malnutrition of moderate degree 05/23/2016  . Ovarian carcinoma [facility])     Past Surgical History:  Procedure Laterality Date  . ABDOMINAL HYSTERECTOMY    . IR GENERIC HISTORICAL  05/24/2016   IR FLUORO GUIDE PORT INSERTION RIGHT 05/24/2016 Greggory Keen, MD [facility]-INTER[facility]  . IR GENERIC HISTORICAL  05/24/2016   IR US GUIDE VASC ACCESS RIGHT 05/24/2016 Greggory Keen, MD [facility]  . IR GENERIC HISTORICAL   06/08/2016   IR PERC TUN PERIT CATH WO PORT S&I D[facility] 06/08/2016 Sandi Mariscal, MD [facility]  . IR REMOVAL PERM PERITONEAL CATH  09/20/2016    OB History    No data available       Home Medications    Prior to Admission medications   Medication Sig Start Date End Date Taking? Authorizing Provider  oxyCODONE (OXY IR/ROXICODONE) 5 MG immediate release tablet Take 5 mg by mouth every 4 (four) hours as needed for severe pain.   Yes [provider]  lidocaine-prilocaine (EMLA) cream Apply to port 1 hour before access with needle. Cover with plastic wrap. Patient not taking: Reported on 11/07/2016 07/24/16   Heath Lark, MD  ondansetron (ZOFRAN ODT) 8 MG disintegrating tablet Take 1 tablet (8 mg total) by mouth every 8 (eight) hours as needed for nausea or vomiting. Patient not taking: Reported on 11/07/2016 09/29/16   Wyatt Portela, MD  promethazine (PHENERGAN) 25 MG tablet Take 1 tablet (25 mg total) by mouth every 6 (six) hours as needed for nausea or vomiting. Patient not taking: Reported on 11/07/2016 10/03/16   Heath Lark, MD    Family History No family history on file.  Social History Social History  Substance Use Topics  . Smoking status: Never Smoker  . Smokeless tobacco: Never Used  . Alcohol use No     Allergies   Patient has no known allergies.   Review of Systems Review of Systems  Constitutional: Negative for fever.  HENT: Negative  for sore throat.   Eyes: Negative for redness.  Respiratory: Negative for shortness of breath.   Cardiovascular: Negative for chest pain.  Gastrointestinal: Positive for abdominal pain.  Genitourinary: Negative for dysuria.  Musculoskeletal: Negative for back pain.  Skin: Negative for rash.  Neurological: Negative for headaches.  Hematological: Does not bruise/bleed easily.  Psychiatric/Behavioral: Negative for confusion.     Physical Exam Updated Vital Signs BP 111/75 (BP Location: Right Arm)   Pulse 97   Temp 98.4 F  (36.9 C) (Oral)   Resp 16   SpO2 100%   Physical Exam  Constitutional: She appears well-developed and well-nourished. No distress.  HENT:  Mouth/Throat: Oropharynx is clear and moist.  Eyes: Conjunctivae are normal. No scleral icterus.  Neck: Neck supple. No tracheal deviation present.  Cardiovascular: Normal rate, regular rhythm, normal heart sounds and intact distal pulses.  Exam reveals no gallop and no friction rub.   No murmur heard. Pulmonary/Chest: Effort normal and breath sounds normal. No respiratory distress.  Abdominal: Soft. Normal appearance and bowel sounds are normal. She exhibits distension. There is tenderness.  Mild diffuse tenderness. Distended. +ascites.   Genitourinary:  Genitourinary Comments: No cva tenderness  Musculoskeletal: She exhibits no edema.  Neurological: She is alert.  Skin: Skin is warm and dry. No rash noted. She is not diaphoretic.  Psychiatric: She has a normal mood and affect.  Nursing note and vitals reviewed.    ED Treatments / Results  Labs (all labs ordered are listed, but only abnormal results are displayed) Results for orders placed or performed during the hospital encounter of 02/13/17  Lipase, blood  Result Value Ref Range   Lipase 26 11 - 51 U/L  Comprehensive metabolic panel  Result Value Ref Range   Sodium 134 (L) 135 - 145 mmol/L   Potassium 3.5 3.5 - 5.1 mmol/L   Chloride 98 (L) 101 - 111 mmol/L   CO2 24 22 - 32 mmol/L   Glucose, Bld 169 (H) 65 - 99 mg/dL   BUN 18 6 - 20 mg/dL   Creatinine, Ser 0.81 0.44 - 1.00 mg/dL   Calcium 9.1 8.9 - 10.3 mg/dL   Total Protein 7.1 6.5 - 8.1 g/dL   Albumin 3.4 (L) 3.5 - 5.0 g/dL   AST 16 15 - 41 U/L   ALT 9 (L) 14 - 54 U/L   Alkaline Phosphatase 84 38 - 126 U/L   Total Bilirubin 0.2 (L) 0.3 - 1.2 mg/dL   GFR calc non Af Amer >60 >60 mL/min   GFR calc Af Amer >60 >60 mL/min   Anion gap 12 5 - 15  CBC  Result Value Ref Range   WBC 9.3 4.0 - 10.5 K/uL   RBC 3.48 (L) 3.87 - 5.11  MIL/uL   Hemoglobin 10.4 (L) 12.0 - 15.0 g/dL   HCT 32.0 (L) 36.0 - 46.0 %   MCV 92.0 78.0 - 100.0 fL   MCH 29.9 26.0 - 34.0 pg   MCHC 32.5 30.0 - 36.0 g/dL   RDW 14.6 11.5 - 15.5 %   Platelets 416 (H) 150 - 400 K/uL   Ct Chest W Contrast  Result Date: 02/13/2017 CLINICAL DATA:  Diffuse abdominal pain. EXAM: CT CHEST, ABDOMEN, AND PELVIS WITH CONTRAST TECHNIQUE: Multidetector CT imaging of the chest, abdomen and pelvis was performed following the standard protocol during bolus administration of intravenous contrast. CONTRAST:  155mL ISOVUE-300 IOPAMIDOL (ISOVUE-300) INJECTION 61% COMPARISON:  08/18/2016 FINDINGS: CT CHEST FINDINGS Cardiovascular: The heart size is  normal. Aortic atherosclerosis noted. Calcification within the LAD coronary artery noted. Mediastinum/Nodes: No enlarged mediastinal, hilar, or axillary lymph nodes. Thyroid gland, trachea, and esophagus demonstrate no significant findings. Lungs/Pleura: No pleural effusion. Atelectasis versus scar noted within both lung bases. No suspicious pulmonary nodules identified. Musculoskeletal: No chest wall mass or suspicious bone lesions identified. CT ABDOMEN PELVIS FINDINGS Hepatobiliary: No suspicious parenchymal abnormality identified. Gallbladder normal. No biliary dilatation. Pancreas: Unremarkable. No pancreatic ductal dilatation or surrounding inflammatory changes. Spleen: Normal in size without focal abnormality. Adrenals/Urinary Tract: The adrenal glands are normal. The kidneys are unremarkable. No hydronephrosis. Urinary bladder is normal. Stomach/Bowel: The stomach appears within normal limits. No pathologic dilatation of the large or small bowel loops. Colonic diverticulosis noted without acute inflammation. Vascular/Lymphatic: Aortic atherosclerosis noted. No aneurysm. No upper abdominal adenopathy. No iliac or inguinal adenopathy. Reproductive: Status post hysterectomy. Other: Similar volume of abdominal and pelvic ascites. The index  lesion demonstrates significant interval decrease in volume from previous exam. Currently 5.5 x 2.2 cm, image 681 of series 2. Previously this measured 17.9 x 5.2 cm. Within the right posterior pelvis there is a lesion measuring 6.7 cm, image 115 of series 2. Previously this measured 1.5 cm. New lesion within the right pericolic gutter measures 2.1 cm, 86 of series 2. Musculoskeletal: No aggressive lytic or sclerotic bone lesions. IMPRESSION: 1. There has been a mixed interval response to therapy. 2. Large central abdominal mass demonstrates significant decrease in volume from previous exam. 3. Enlarging peritoneal lesion within the right posterior pelvis. A new lesion is identified along the right pericolic gutter. 4. Similar volume of ascites within the abdomen or pelvis. Electronically Signed   By: Kerby Moors M.D.   On: 02/13/2017 14:49   Ct Abdomen Pelvis W Contrast  Result Date: 02/13/2017 CLINICAL DATA:  Diffuse abdominal pain. EXAM: CT CHEST, ABDOMEN, AND PELVIS WITH CONTRAST TECHNIQUE: Multidetector CT imaging of the chest, abdomen and pelvis was performed following the standard protocol during bolus administration of intravenous contrast. CONTRAST:  15mL ISOVUE-300 IOPAMIDOL (ISOVUE-300) INJECTION 61% COMPARISON:  08/18/2016 FINDINGS: CT CHEST FINDINGS Cardiovascular: The heart size is normal. Aortic atherosclerosis noted. Calcification within the LAD coronary artery noted. Mediastinum/Nodes: No enlarged mediastinal, hilar, or axillary lymph nodes. Thyroid gland, trachea, and esophagus demonstrate no significant findings. Lungs/Pleura: No pleural effusion. Atelectasis versus scar noted within both lung bases. No suspicious pulmonary nodules identified. Musculoskeletal: No chest wall mass or suspicious bone lesions identified. CT ABDOMEN PELVIS FINDINGS Hepatobiliary: No suspicious parenchymal abnormality identified. Gallbladder normal. No biliary dilatation. Pancreas: Unremarkable. No pancreatic  ductal dilatation or surrounding inflammatory changes. Spleen: Normal in size without focal abnormality. Adrenals/Urinary Tract: The adrenal glands are normal. The kidneys are unremarkable. No hydronephrosis. Urinary bladder is normal. Stomach/Bowel: The stomach appears within normal limits. No pathologic dilatation of the large or small bowel loops. Colonic diverticulosis noted without acute inflammation. Vascular/Lymphatic: Aortic atherosclerosis noted. No aneurysm. No upper abdominal adenopathy. No iliac or inguinal adenopathy. Reproductive: Status post hysterectomy. Other: Similar volume of abdominal and pelvic ascites. The index lesion demonstrates significant interval decrease in volume from previous exam. Currently 5.5 x 2.2 cm, image 681 of series 2. Previously this measured 17.9 x 5.2 cm. Within the right posterior pelvis there is a lesion measuring 6.7 cm, image 115 of series 2. Previously this measured 1.5 cm. New lesion within the right pericolic gutter measures 2.1 cm, 86 of series 2. Musculoskeletal: No aggressive lytic or sclerotic bone lesions. IMPRESSION: 1. There has been a mixed interval  response to therapy. 2. Large central abdominal mass demonstrates significant decrease in volume from previous exam. 3. Enlarging peritoneal lesion within the right posterior pelvis. A new lesion is identified along the right pericolic gutter. 4. Similar volume of ascites within the abdomen or pelvis. Electronically Signed   By: Kerby Moors M.D.   On: 02/13/2017 14:49   Dg Abd 2 Views  Result Date: 02/07/2017 CLINICAL DATA:  Peritoneal carcinomatosis.  Abdominal distention. EXAM: ABDOMEN - 2 VIEW COMPARISON:  CT from 01/08/2017 FINDINGS: Normal bowel gas pattern. No abnormal stool retention, with stool mainly seen in the proximal colon. No concerning mass effect or gas collection. No acute osseous finding. IMPRESSION: 1. Normal bowel gas pattern. 2. Stool distends the ascending colon. No generalized stool  retention or rectal impaction. Electronically Signed   By: Monte Fantasia M.D.   On: 02/07/2017 16:16    EKG  EKG Interpretation None       Radiology No results found.  Procedures Procedures (including critical care time)  Medications Ordered in ED Medications  sodium chloride 0.9 % bolus 1,000 mL (not administered)  HYDROmorphone (DILAUDID) injection 1 mg (not administered)  ondansetron (ZOFRAN) injection 4 mg (not administered)     Initial Impression / Assessment and Plan / ED Course  I have reviewed the triage vital signs and the nursing notes.  Pertinent labs & imaging results that were available during my care of the patient were reviewed by me and considered in my medical decision making (see chart for details).  Iv ns. Labs.   Dilaudid 1 mg iv for pain. zofran for nausea.  Reviewed nursing notes and prior charts for additional history.   Pt scheduled for CTs 9/13, family/pt inquire about being able to get done today - ordered.   Recheck pt - offered admission vs d/c - pt requests d/c, states will be more comfortable at home, does inquire about paracentesis that Dr Alvy Bimler had mentioned.  I spoke with Dr Alvy Bimler, including CT - she indicates she will f/u with patient on Friday, and requests we order u/s paracentesis for today (or tomorrow AM if unable to do now).  U/S paracentesis ordered.  Recheck pt comfortable. Pain improved. No nv.   Signed out to Dr Zenia Resides to recheck s/p paracentesis, suspect d/c to home then, with f/u already arranged this Friday.   Final Clinical Impressions(s) / ED Diagnoses   Final diagnoses:  None    New Prescriptions New Prescriptions   No medications on file         Lajean Saver, MD 02/13/17 1610

## 2017-02-13 NOTE — ED Notes (Signed)
Pt refused Meds, see MAR.

## 2017-02-13 NOTE — ED Notes (Signed)
Pt took Oxycodone @ 1130 5mg 

## 2017-02-14 ENCOUNTER — Other Ambulatory Visit (HOSPITAL_COMMUNITY): Payer: Self-pay | Admitting: Emergency Medicine

## 2017-02-14 ENCOUNTER — Ambulatory Visit (HOSPITAL_COMMUNITY): Admission: RE | Admit: 2017-02-14 | Payer: Self-pay | Source: Ambulatory Visit

## 2017-02-14 ENCOUNTER — Telehealth: Payer: Self-pay

## 2017-02-14 ENCOUNTER — Ambulatory Visit (HOSPITAL_COMMUNITY)
Admission: RE | Admit: 2017-02-14 | Discharge: 2017-02-14 | Disposition: A | Discharge status: Home / Self Care | Payer: Self-pay | Source: Ambulatory Visit | Attending: Emergency Medicine | Admitting: Emergency Medicine

## 2017-02-14 ENCOUNTER — Encounter (HOSPITAL_COMMUNITY): Payer: Self-pay | Admitting: Student

## 2017-02-14 ENCOUNTER — Ambulatory Visit (HOSPITAL_BASED_OUTPATIENT_CLINIC_OR_DEPARTMENT_OTHER): Payer: Self-pay | Admitting: Hematology and Oncology

## 2017-02-14 VITALS — BP 115/61 | HR 99 | Temp 98.4°F | Resp 17 | Ht 63.0 in | Wt 167.6 lb

## 2017-02-14 DIAGNOSIS — Z8543 Personal history of malignant neoplasm of ovary: Secondary | ICD-10-CM | POA: Insufficient documentation

## 2017-02-14 DIAGNOSIS — C786 Secondary malignant neoplasm of retroperitoneum and peritoneum: Secondary | ICD-10-CM

## 2017-02-14 DIAGNOSIS — R180 Malignant ascites: Secondary | ICD-10-CM | POA: Insufficient documentation

## 2017-02-14 DIAGNOSIS — D6481 Anemia due to antineoplastic chemotherapy: Secondary | ICD-10-CM

## 2017-02-14 DIAGNOSIS — Z7189 Other specified counseling: Secondary | ICD-10-CM

## 2017-02-14 DIAGNOSIS — R0600 Dyspnea, unspecified: Secondary | ICD-10-CM

## 2017-02-14 DIAGNOSIS — C801 Malignant (primary) neoplasm, unspecified: Principal | ICD-10-CM

## 2017-02-14 DIAGNOSIS — C569 Malignant neoplasm of unspecified ovary: Secondary | ICD-10-CM

## 2017-02-14 DIAGNOSIS — T451X5A Adverse effect of antineoplastic and immunosuppressive drugs, initial encounter: Secondary | ICD-10-CM

## 2017-02-14 DIAGNOSIS — G893 Neoplasm related pain (acute) (chronic): Secondary | ICD-10-CM

## 2017-02-14 HISTORY — PX: IR PARACENTESIS: IMG2679

## 2017-02-14 MED ORDER — OXYCODONE HCL 5 MG PO TABS
5.0000 mg | ORAL_TABLET | ORAL | 0 refills | Status: DC | PRN
Start: 2017-02-14 — End: 2017-03-01

## 2017-02-14 MED ORDER — LIDOCAINE HCL (PF) 1 % IJ SOLN
INTRAMUSCULAR | Status: DC | PRN
  Administered 2017-02-14: 10 mL

## 2017-02-14 MED ORDER — LIDOCAINE HCL (PF) 1 % IJ SOLN
INTRAMUSCULAR | Status: AC
  Filled 2017-02-14: qty 30

## 2017-02-14 MED FILL — oxyCODONE HCL 5 MG TABS: 5 | 10 days supply | Qty: 60 | Fill #0

## 2017-02-14 NOTE — Assessment & Plan Note (Signed)
I recommend prescription oxycodone to take as needed for pain I refill her prescription today and warned her about risk of sedation, nausea and constipation

## 2017-02-14 NOTE — Procedures (Signed)
PROCEDURE SUMMARY:  Successful US guided paracentesis from right lateral abdomen.  Yielded 2.6 liters of bloody fluid.  No immediate complications.  Pt tolerated well.   Specimen was sent for labs.  Docia Barrier PA-C 02/14/2017 10:32 AM

## 2017-02-14 NOTE — Assessment & Plan Note (Signed)
We have extensive discussion about goals of care Her disease is incurable, goals of treatment is palliative It is not a guarantee that the current treatment will work again We will continue active supportive care in the meantime The patient wants to  continue treatment regardless.

## 2017-02-14 NOTE — Assessment & Plan Note (Signed)
I have reviewed imaging study with the patient and her daughter The patient have evidence of disease relapse or recurrent ascites recurrent abdominal mass We discussed the risks, benefits, side effects of resumption of chemotherapy with prior treatment with carboplatin and Gemzar and she agreed to proceed Due to scheduling issue, we will start her first treatment next week I will see her back in 2 weeks for further review

## 2017-02-14 NOTE — Telephone Encounter (Signed)
Gave patient avs and calender for October. Per 9/12 los

## 2017-02-14 NOTE — Assessment & Plan Note (Signed)
This is likely due to recent treatment and disease. The patient denies recent history of bleeding such as epistaxis, hematuria or hematochezia. She is asymptomatic from the anemia. I will observe for now.

## 2017-02-14 NOTE — Progress Notes (Signed)
[place] OFFICE PROGRESS NOTE  Patient Care Team: Patient, No Pcp Per as PCP - General (General Practice)  SUMMARY OF ONCOLOGIC HISTORY:   Ovarian carcinoma ([place])   04/19/2015 Initial Diagnosis    In late 03-2015 she in [place] and was found to have large pelvic mass and perihepatic ascites. CA 125 then was 359, with CEA of 3, CA 19-9 5.8 and alpha fetoprotein 2.3. She had exploratory laparotomy 04-19-15, not done by gyn oncologist, with bilateral ovaried an tumors, 10 cm with capsule ruptured on right , smaller on left, peritoneal carcinomatosis.       05/20/2015 Miscellaneous    She had some bowel obstruction post operatively, with NG used . Pathology reportedcystic and solid left ovarian mass, 13 cm right ovarian mass, moderately differentiated tumor "tumor de celulas de la granulosa". 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 CT AP 05-2015 had ascites, carcinomatosis and multiple peritoneal implants. CA 125 on 05-24-15 baseline for chemo was 141. She received 3 cycles of taxol + carboplatin from 05-26-15 thru 07-07-15 (not clear if avastin given those cycles), after which carboplatin was not available in [place]. She received taxol 175 mg/m2, CDDP and avastin for 3 additional cycles thru 10-07-15. Second look laparotomy was discussed, patient declined, and instead had 4 cycles avastin + taxol from 12-01-15 thru 02-02-16 (apparentlyno CDDP after 10-07-15 and no carboplatin after 07-07-15). CT CAP 02-24-16 reportedly had no evidence of malignancy, with some bullous emphysema, tortuous sigmoid colon with diverticulosis. . Recommendation was for maintenance avastin x 1 year, however patient then came to  to be with family.       05/20/2016 Imaging    Extensive peritoneal disease with omental thickening/ 18 and large volume ascites. 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  Trace bilateral pleural effusions. Moderate hiatal hernia. Bilateral inguinal hernias containing fat. Left colonic diverticulosis. Aortoiliac atherosclerosis.      05/20/2016 - 06/13/2016 Hospital Admission    76 year old female with a history of metastatic ovarian cancer diagnosed in January 2017 (s/p primary debulking surgery followed by 8 adjuvant cycles of carboplatin and paclitaxel and 4 additional cycles of carboplatin, paclitaxel, avastin), last chemo Sept 2017, presented with progressive abdominal bloating and pain for 3 weeks duration. She was feeling well until the first week of December, 2017 when she was sick with a virus from which she never recovered. She states that she began feeling distended in the abdomen and uncomfortable and was taken to the ED on 05/20/16. CT chest/abd/pelvis9-21-17 reportedly with no evidence of malignancy, with some bullous emphysema, tortuous sigmoid colon with diverticulosis. Recommendation was for maintenance avastin x 1 year, however patient then came to [facility] in 02-2016. She has not established care with any physician since coming to [place]. Unfortunately, after paracentesis she began having increasing abdominal pain. Abdominal x-ray revealed distended small bowel loops concerning for small bowel obstruction. Gen. surgery was consulted. NG was inserted to suction. Because of prolonged Hospitalization she was placed on TPN/TNA. She underwent Chemotheray with Doxil on 12/26. Repeat U/S 12/31 showed interval progression of ascites and She is s/p repeat Paracentesis 06-06-16 which removed 4 liters. She was subsequently discharged      05/21/2016 Tumor Marker    Patient's tumor was tested for the following markers: CA125 Results of the tumor marker test revealed 249.8      05/22/2016 Procedure    Successful ultrasound-guided paracentesis yielding 2.9 liters of peritoneal fluid.  05/22/2016 Pathology Results    PERITONEAL/ASCITIC FLUID, 1 OF 1 COLLECTED ON  05/22/16: ADENOCARCINOMA. ADDENDUM: Immunohistochemistry is performed on the cell block and the tumor is strongly positive with cytokeratin 7 and cytokeratin 8/18 and shows patchy positivity with estrogen receptor, cytokeratin 5/6, WT-1 and MOC-31 with focal weak positivity with PAX-8. The tumor is negative with thyroglobulin, thyroid transcription factor-1, Napsin-A, progesterone receptor, GATA 3, gross cystic disease fluid protein, cytokeratin 20, CDX-2 and Calretinin. 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       05/24/2016 Procedure    Ultrasound and fluoroscopically guided right internal jugular single lumen power port catheter insertion. Tip in the SVC/RA junction. Catheter ready for use      05/24/2016 Imaging    LV EF: 65% -   70%      05/28/2016 Imaging    Small volume of abdominal and pelvic ascites is identified and inadequate for paracentesis.      05/30/2016 - 08/21/2016 Chemotherapy    She received doxorubicin x 3 cycles, stopped due to disease progression      06/04/2016 Imaging    Increased amount of ascites, freely distributed in all 4 quadrants. No other acute finding by ultrasound.      06/06/2016 Imaging    Small left greater than right pleural effusions, increased compared to prior CT. Interim development of partial consolidations within the bilateral lower lobes which may reflect atelectasis or pneumonia. Few scattered foci of ground-glass density within the right upper and left upper lobes, suspect small foci of inflammation or infection. 2. Moderate to large volume of ascites in the upper abdomen with extensive mesenteric and omental metastatic disease partially visualized. 3. Dilated gallbladder.      06/08/2016 Procedure    Successful ultrasound and fluoroscopic guided placement of a tunneled peritoneal drainage catheter.  2. Successful aspiration of 900 cc of serous ascites following tunneled peritoneal drainage catheter placement.      06/19/2016 Tumor Marker    Patient's tumor was tested for the following markers: CA125 Results of the tumor marker test revealed 166.6      07/24/2016 Tumor Marker    Patient's tumor was tested for the following markers: CA125 Results of the tumor marker test revealed 104.4      08/18/2016 Imaging    Extensive peritoneal metastatic disease within the abdomen. This is somewhat difficult to compare with prior given diffuse nature however there is suggestion of interval progression. Interval resolution of previously described bilateral pleural effusions. Peritoneal drainage catheter is present with tip terminating in the right upper quadrant. Interval decrease in now small volume ascites. Aortic atherosclerosis. Diverticulosis without CT evidence for acute diverticulitis.      08/18/2016 Imaging    LV EF: 60% -  65%      08/21/2016 Tumor Marker    Patient's tumor was tested for the following markers: CA125 Results of the tumor marker test revealed 96.1      08/29/2016 - 12/26/2016 Chemotherapy    She received gemzar and carboplatin      09/05/2016 Tumor Marker    Patient's tumor was tested for the following markers: CA125 Results of the tumor marker test revealed 48.1      09/20/2016 Procedure    Status post bedside removal of tunneled right-sided peritoneal drain which was removed in its entirety.      10/03/2016 Tumor Marker    Patient's tumor  was tested for the following markers: CA125 Results of the tumor marker test revealed 22.9      11/03/2016 Imaging    Interval decrease in peritoneal metastatic disease when compared with prior exam. Aortic atherosclerosis. Sigmoid colonic diverticulosis.      11/07/2016 Tumor Marker    Patient's tumor was tested for the following markers: CA125 Results of the tumor marker test revealed 9.6      12/05/2016 Tumor Marker     Patient's tumor was tested for the following markers: CA125 Results of the tumor marker test revealed 9.6      12/19/2016 Tumor Marker    Patient's tumor was tested for the following markers: CA-125 Results of the tumor marker test revealed 8.      01/08/2017 Imaging    1. Disease described previously in the central small bowel mesentery is now noted to be omental. Omental positioning is quite different between the 2 studies making reproducible measurement impossible, but the confluent soft tissue attenuation and bulkiness of this disease appears qualitatively decreased in the interval. Despite the improvement in the central omental disease, there is a new 1.6 cm left omental nodule towards the splenic flexure which cannot be identified on the previous study, concerning for new area of focal progression. 2. Trace free fluid in the pelvis. 3. Small groin hernias contain only fat. 4. Left colonic diverticulosis without diverticulitis. 5. Aortic Atherosclerois (ICD10-170.0)      02/07/2017 Tumor Marker    Patient's tumor was tested for the following markers: CA125 Results of the tumor marker test revealed 98.6      02/07/2017 Imaging    1. Normal bowel gas pattern. 2. Stool distends the ascending colon. No generalized stool retention or rectal impaction      02/13/2017 Imaging    Ct chest, abdomen and pelvis: 1. There has been a mixed interval response to therapy. 2. Large central abdominal mass demonstrates significant decrease in volume from previous exam. 3. Enlarging peritoneal lesion within the right posterior pelvis. A new lesion is identified along the right pericolic gutter. 4. Similar volume of ascites within the abdomen or pelvis      02/14/2017 Procedure    Successful ultrasound-guided therapeutic paracentesis yielding 2.6 liters of peritoneal fluid       INTERVAL HISTORY: Please see below for problem oriented charting. She returns with her daughter for further  follow-up Her daughter serves as an interpreter Her abdominal pain is better with oxycodone and recent therapeutic paracentesis She denies nausea or constipation  REVIEW OF SYSTEMS:   Constitutional: Denies fevers, chills or abnormal weight loss Eyes: Denies blurriness of vision Ears, nose, mouth, throat, and face: Denies mucositis or sore throat Respiratory: Denies cough, dyspnea or wheezes Cardiovascular: Denies palpitation, chest discomfort or lower extremity swelling Gastrointestinal:  Denies nausea, heartburn or change in bowel habits Skin: Denies abnormal skin rashes Lymphatics: Denies new lymphadenopathy or easy bruising Neurological:Denies numbness, tingling or new weaknesses Behavioral/Psych: Mood is stable, no new changes  All other systems were reviewed with the patient and are negative.  I have reviewed the past medical history, past surgical history, social history and family history with the patient and they are unchanged from previous note.  ALLERGIES:  has No Known Allergies.  MEDICATIONS:  Current Outpatient Prescriptions  Medication Sig Dispense Refill  . lidocaine-prilocaine (EMLA) cream Apply to port 1 hour before access with needle. Cover with plastic wrap. (Patient not taking: Reported on 11/07/2016) 30 g 11  . ondansetron (ZOFRAN ODT) 8  MG disintegrating tablet Take 1 tablet (8 mg total) by mouth every 8 (eight) hours as needed for nausea or vomiting. (Patient not taking: Reported on 11/07/2016) 30 tablet 0  . oxyCODONE (OXY IR/ROXICODONE) 5 MG immediate release tablet Take 1 tablet (5 mg total) by mouth every 4 (four) hours as needed for severe pain. 60 tablet 0  . promethazine (PHENERGAN) 25 MG tablet Take 1 tablet (25 mg total) by mouth every 6 (six) hours as needed for nausea or vomiting. (Patient not taking: Reported on 11/07/2016) 30 tablet 9   No current facility-administered medications for this visit.    Facility-Administered Medications Ordered in Other  Visits  Medication Dose Route Frequency Provider Last Rate Last Dose  . lidocaine (PF) (XYLOCAINE) 1 % injection           . lidocaine (PF) (XYLOCAINE) 1 % injection    PRN Docia Barrier, PA   10 mL at 02/14/17 1011    PHYSICAL EXAMINATION: ECOG PERFORMANCE STATUS: 1 - Symptomatic but completely ambulatory  Vitals:   02/14/17 1429  BP: 115/61  Pulse: 99  Resp: 17  Temp: 98.4 F (36.9 C)  SpO2: 100%   Filed Weights   02/14/17 1429  Weight: 167 lb 9.6 oz (76 kg)    GENERAL:alert, no distress and comfortable SKIN: skin color, texture, turgor are normal, no rashes or significant lesions EYES: normal, Conjunctiva are pink and non-injected, sclera clear Musculoskeletal:no cyanosis of digits and no clubbing  NEURO: alert & oriented x 3 with fluent speech, no focal motor/sensory deficits  LABORATORY DATA:  I have reviewed the data as listed    Component Value Date/Time   NA 134 (L) 02/13/2017 1245   NA 134 (L) 02/07/2017 1443   K 3.5 02/13/2017 1245   K 4.2 02/07/2017 1443   CL 98 (L) 02/13/2017 1245   CO2 24 02/13/2017 1245   CO2 27 02/07/2017 1443   GLUCOSE 169 (H) 02/13/2017 1245   GLUCOSE 97 02/07/2017 1443   BUN 18 02/13/2017 1245   BUN 12.9 02/07/2017 1443   CREATININE 0.81 02/13/2017 1245   CREATININE 0.9 02/07/2017 1443   CALCIUM 9.1 02/13/2017 1245   CALCIUM 9.6 02/07/2017 1443   PROT 7.1 02/13/2017 1245   PROT 7.3 02/07/2017 1443   ALBUMIN 3.4 (L) 02/13/2017 1245   ALBUMIN 3.4 (L) 02/07/2017 1443   AST 16 02/13/2017 1245   AST 14 02/07/2017 1443   ALT 9 (L) 02/13/2017 1245   ALT 6 02/07/2017 1443   ALKPHOS 84 02/13/2017 1245   ALKPHOS 91 02/07/2017 1443   BILITOT 0.2 (L) 02/13/2017 1245   BILITOT 0.43 02/07/2017 1443   GFRNONAA >60 02/13/2017 1245   GFRAA >60 02/13/2017 1245    No results found for: SPEP, UPEP  Lab Results  Component Value Date   WBC 9.3 02/13/2017   NEUTROABS 5.4 02/07/2017   HGB 10.4 (L) 02/13/2017   HCT 32.0 (L)  02/13/2017   MCV 92.0 02/13/2017   PLT 416 (H) 02/13/2017      Chemistry      Component Value Date/Time   NA 134 (L) 02/13/2017 1245   NA 134 (L) 02/07/2017 1443   K 3.5 02/13/2017 1245   K 4.2 02/07/2017 1443   CL 98 (L) 02/13/2017 1245   CO2 24 02/13/2017 1245   CO2 27 02/07/2017 1443   BUN 18 02/13/2017 1245   BUN 12.9 02/07/2017 1443   CREATININE 0.81 02/13/2017 1245   CREATININE 0.9 02/07/2017 1443  Component Value Date/Time   CALCIUM 9.1 02/13/2017 1245   CALCIUM 9.6 02/07/2017 1443   ALKPHOS 84 02/13/2017 1245   ALKPHOS 91 02/07/2017 1443   AST 16 02/13/2017 1245   AST 14 02/07/2017 1443   ALT 9 (L) 02/13/2017 1245   ALT 6 02/07/2017 1443   BILITOT 0.2 (L) 02/13/2017 1245   BILITOT 0.43 02/07/2017 1443       RADIOGRAPHIC STUDIES: I have reviewed imaging study with the patient and her daughter I have personally reviewed the radiological images as listed and agreed with the findings in the report. Ct Chest W Contrast  Result Date: 02/13/2017 CLINICAL DATA:  Diffuse abdominal pain. EXAM: CT CHEST, ABDOMEN, AND PELVIS WITH CONTRAST TECHNIQUE: Multidetector CT imaging of the chest, abdomen and pelvis was performed following the standard protocol during bolus administration of intravenous contrast. CONTRAST:  126mL ISOVUE-300 IOPAMIDOL (ISOVUE-300) INJECTION 61% COMPARISON:  08/18/2016 FINDINGS: CT CHEST FINDINGS Cardiovascular: The heart size is normal. Aortic atherosclerosis noted. Calcification within the LAD coronary artery noted. Mediastinum/Nodes: No enlarged mediastinal, hilar, or axillary lymph nodes. Thyroid gland, trachea, and esophagus demonstrate no significant findings. Lungs/Pleura: No pleural effusion. Atelectasis versus scar noted within both lung bases. No suspicious pulmonary nodules identified. Musculoskeletal: No chest wall mass or suspicious bone lesions identified. CT ABDOMEN PELVIS FINDINGS Hepatobiliary: No suspicious parenchymal abnormality  identified. Gallbladder normal. No biliary dilatation. Pancreas: Unremarkable. No pancreatic ductal dilatation or surrounding inflammatory changes. Spleen: Normal in size without focal abnormality. Adrenals/Urinary Tract: The adrenal glands are normal. The kidneys are unremarkable. No hydronephrosis. Urinary bladder is normal. Stomach/Bowel: The stomach appears within normal limits. No pathologic dilatation of the large or small bowel loops. Colonic diverticulosis noted without acute inflammation. Vascular/Lymphatic: Aortic atherosclerosis noted. No aneurysm. No upper abdominal adenopathy. No iliac or inguinal adenopathy. Reproductive: Status post hysterectomy. Other: Similar volume of abdominal and pelvic ascites. The index lesion demonstrates significant interval decrease in volume from previous exam. Currently 5.5 x 2.2 cm, image 681 of series 2. Previously this measured 17.9 x 5.2 cm. Within the right posterior pelvis there is a lesion measuring 6.7 cm, image 115 of series 2. Previously this measured 1.5 cm. New lesion within the right pericolic gutter measures 2.1 cm, 86 of series 2. Musculoskeletal: No aggressive lytic or sclerotic bone lesions. IMPRESSION: 1. There has been a mixed interval response to therapy. 2. Large central abdominal mass demonstrates significant decrease in volume from previous exam. 3. Enlarging peritoneal lesion within the right posterior pelvis. A new lesion is identified along the right pericolic gutter. 4. Similar volume of ascites within the abdomen or pelvis. Electronically Signed   By: Kerby Moors M.D.   On: 02/13/2017 14:49   Ct Abdomen Pelvis W Contrast  Result Date: 02/13/2017 CLINICAL DATA:  Diffuse abdominal pain. EXAM: CT CHEST, ABDOMEN, AND PELVIS WITH CONTRAST TECHNIQUE: Multidetector CT imaging of the chest, abdomen and pelvis was performed following the standard protocol during bolus administration of intravenous contrast. CONTRAST:  174mL ISOVUE-300 IOPAMIDOL  (ISOVUE-300) INJECTION 61% COMPARISON:  08/18/2016 FINDINGS: CT CHEST FINDINGS Cardiovascular: The heart size is normal. Aortic atherosclerosis noted. Calcification within the LAD coronary artery noted. Mediastinum/Nodes: No enlarged mediastinal, hilar, or axillary lymph nodes. Thyroid gland, trachea, and esophagus demonstrate no significant findings. Lungs/Pleura: No pleural effusion. Atelectasis versus scar noted within both lung bases. No suspicious pulmonary nodules identified. Musculoskeletal: No chest wall mass or suspicious bone lesions identified. CT ABDOMEN PELVIS FINDINGS Hepatobiliary: No suspicious parenchymal abnormality identified. Gallbladder normal. No biliary dilatation.  Pancreas: Unremarkable. No pancreatic ductal dilatation or surrounding inflammatory changes. Spleen: Normal in size without focal abnormality. Adrenals/Urinary Tract: The adrenal glands are normal. The kidneys are unremarkable. No hydronephrosis. Urinary bladder is normal. Stomach/Bowel: The stomach appears within normal limits. No pathologic dilatation of the large or small bowel loops. Colonic diverticulosis noted without acute inflammation. Vascular/Lymphatic: Aortic atherosclerosis noted. No aneurysm. No upper abdominal adenopathy. No iliac or inguinal adenopathy. Reproductive: Status post hysterectomy. Other: Similar volume of abdominal and pelvic ascites. The index lesion demonstrates significant interval decrease in volume from previous exam. Currently 5.5 x 2.2 cm, image 681 of series 2. Previously this measured 17.9 x 5.2 cm. Within the right posterior pelvis there is a lesion measuring 6.7 cm, image 115 of series 2. Previously this measured 1.5 cm. New lesion within the right pericolic gutter measures 2.1 cm, 86 of series 2. Musculoskeletal: No aggressive lytic or sclerotic bone lesions. IMPRESSION: 1. There has been a mixed interval response to therapy. 2. Large central abdominal mass demonstrates significant decrease in  volume from previous exam. 3. Enlarging peritoneal lesion within the right posterior pelvis. A new lesion is identified along the right pericolic gutter. 4. Similar volume of ascites within the abdomen or pelvis. Electronically Signed   By: Kerby Moors M.D.   On: 02/13/2017 14:49   Dg Abd 2 Views  Result Date: 02/07/2017 CLINICAL DATA:  Peritoneal carcinomatosis.  Abdominal distention. EXAM: ABDOMEN - 2 VIEW COMPARISON:  CT from 01/08/2017 FINDINGS: Normal bowel gas pattern. No abnormal stool retention, with stool mainly seen in the proximal colon. No concerning mass effect or gas collection. No acute osseous finding. IMPRESSION: 1. Normal bowel gas pattern. 2. Stool distends the ascending colon. No generalized stool retention or rectal impaction. Electronically Signed   By: Monte Fantasia M.D.   On: 02/07/2017 16:16   Ir Paracentesis  Result Date: 02/14/2017 INDICATION: History of ovarian cancer, recurrent ascites. Request is made for therapeutic paracentesis. EXAM: ULTRASOUND GUIDED THERAPEUTIC PARACENTESIS MEDICATIONS: 10 mL 1% lidocaine COMPLICATIONS: None immediate. PROCEDURE: Informed written consent was obtained from the patient after a discussion of the risks, benefits and alternatives to treatment. A timeout was performed prior to the initiation of the procedure. Initial ultrasound scanning demonstrates a large amount of ascites within the right lateral abdomen. The right lateral abdomen was prepped and draped in the usual sterile fashion. 1% lidocaine was used for local anesthesia. Following this, a 19 gauge, 7-cm, Yueh catheter was introduced, but was unable to pass through the peritoneal lining due to tenting. A 19 gauge, 10-cm, Yueh was then introduced. An ultrasound image was saved for documentation purposes. The paracentesis was performed. The catheter was removed and a dressing was applied. The patient tolerated the procedure well without immediate post procedural complication. FINDINGS: A  total of approximately 2.6 liters of bloody fluid was removed. IMPRESSION: Successful ultrasound-guided therapeutic paracentesis yielding 2.6 liters of peritoneal fluid. Read by:  Brynda Greathouse PA-C Electronically Signed   By: Aletta Edouard M.D.   On: 02/14/2017 10:46    ASSESSMENT & PLAN:  Ovarian carcinoma ([place]) I have reviewed imaging study with the patient and her daughter The patient have evidence of disease relapse or recurrent ascites recurrent abdominal mass We discussed the risks, benefits, side effects of resumption of chemotherapy with prior treatment with carboplatin and Gemzar and she agreed to proceed Due to scheduling issue, we will start her first treatment next week I will see her back in 2 weeks for further review  Cancer associated pain I recommend prescription oxycodone to take as needed for pain I refill her prescription today and warned her about risk of sedation, nausea and constipation  Anemia due to antineoplastic chemotherapy This is likely due to recent treatment and disease. The patient denies recent history of bleeding such as epistaxis, hematuria or hematochezia. She is asymptomatic from the anemia. I will observe for now.  Goals of care, counseling/discussion We have extensive discussion about goals of care Her disease is incurable, goals of treatment is palliative It is not a guarantee that the current treatment will work again We will continue active supportive care in the meantime The patient wants to  continue treatment regardless.   No orders of the defined types were placed in this encounter.  All questions were answered. The patient knows to call the clinic with any problems, questions or concerns. No barriers to learning was detected. I spent 30 minutes counseling the patient face to face. The total time spent in the appointment was 40 minutes and more than 50% was on counseling and review of test results     Heath Lark, MD 02/14/2017 3:05  PM

## 2017-02-15 ENCOUNTER — Ambulatory Visit (HOSPITAL_COMMUNITY): Payer: Self-pay

## 2017-02-16 ENCOUNTER — Ambulatory Visit: Payer: Self-pay | Admitting: Hematology and Oncology

## 2017-02-19 ENCOUNTER — Telehealth: Payer: Self-pay

## 2017-02-19 ENCOUNTER — Other Ambulatory Visit: Payer: Self-pay | Admitting: Hematology and Oncology

## 2017-02-19 DIAGNOSIS — R180 Malignant ascites: Secondary | ICD-10-CM

## 2017-02-19 DIAGNOSIS — C786 Secondary malignant neoplasm of retroperitoneum and peritoneum: Secondary | ICD-10-CM

## 2017-02-19 DIAGNOSIS — C801 Malignant (primary) neoplasm, unspecified: Principal | ICD-10-CM

## 2017-02-19 NOTE — Telephone Encounter (Signed)
Daughter Belenda Cruise called her next appt is Thursday for lab/ flush/ infusion.  She is full of fluid again. She has a big belly. She is painful and uncomfortable. She is always SOB so no change with that. Belenda Cruise is requesting paracentesis before Thursday's treatment.

## 2017-02-19 NOTE — Telephone Encounter (Signed)
appt at 1045 at [facility] on 9/18. Belenda Cruise is aware.

## 2017-02-19 NOTE — Telephone Encounter (Signed)
I placed order for STAT US paracentesis at [facility] tomorrow Please call

## 2017-02-19 NOTE — Telephone Encounter (Signed)
Called central scheduling

## 2017-02-20 ENCOUNTER — Ambulatory Visit (HOSPITAL_COMMUNITY)
Admission: RE | Admit: 2017-02-20 | Discharge: 2017-02-20 | Disposition: A | Discharge status: Home / Self Care | Payer: Self-pay | Source: Ambulatory Visit | Attending: Hematology and Oncology | Admitting: Hematology and Oncology

## 2017-02-20 DIAGNOSIS — C180 Malignant neoplasm of cecum: Secondary | ICD-10-CM | POA: Insufficient documentation

## 2017-02-20 DIAGNOSIS — C786 Secondary malignant neoplasm of retroperitoneum and peritoneum: Secondary | ICD-10-CM | POA: Insufficient documentation

## 2017-02-20 DIAGNOSIS — C801 Malignant (primary) neoplasm, unspecified: Secondary | ICD-10-CM

## 2017-02-20 DIAGNOSIS — R180 Malignant ascites: Secondary | ICD-10-CM | POA: Insufficient documentation

## 2017-02-20 MED ORDER — LIDOCAINE HCL 2 % IJ SOLN
INTRAMUSCULAR | Status: AC
  Filled 2017-02-20: qty 10

## 2017-02-22 ENCOUNTER — Ambulatory Visit: Payer: Self-pay

## 2017-02-22 ENCOUNTER — Ambulatory Visit (HOSPITAL_BASED_OUTPATIENT_CLINIC_OR_DEPARTMENT_OTHER): Payer: Self-pay

## 2017-02-22 ENCOUNTER — Other Ambulatory Visit (HOSPITAL_BASED_OUTPATIENT_CLINIC_OR_DEPARTMENT_OTHER): Payer: Self-pay

## 2017-02-22 VITALS — BP 127/64 | HR 82 | Temp 98.4°F | Resp 18

## 2017-02-22 DIAGNOSIS — C569 Malignant neoplasm of unspecified ovary: Secondary | ICD-10-CM

## 2017-02-22 DIAGNOSIS — C801 Malignant (primary) neoplasm, unspecified: Secondary | ICD-10-CM

## 2017-02-22 DIAGNOSIS — Z5111 Encounter for antineoplastic chemotherapy: Secondary | ICD-10-CM

## 2017-02-22 DIAGNOSIS — C786 Secondary malignant neoplasm of retroperitoneum and peritoneum: Secondary | ICD-10-CM

## 2017-02-22 DIAGNOSIS — Z95828 Presence of other vascular implants and grafts: Secondary | ICD-10-CM

## 2017-02-22 LAB — COMPREHENSIVE METABOLIC PANEL
ALT: 6 U/L (ref 0–55)
AST: 12 U/L (ref 5–34)
Albumin: 2.4 g/dL — ABNORMAL LOW (ref 3.5–5.0)
Alkaline Phosphatase: 109 U/L (ref 40–150)
Anion Gap: 12 mEq/L — ABNORMAL HIGH (ref 3–11)
BUN: 16.4 mg/dL (ref 7.0–26.0)
CALCIUM: 9.3 mg/dL (ref 8.4–10.4)
CHLORIDE: 92 meq/L — AB (ref 98–109)
CO2: 25 mEq/L (ref 22–29)
CREATININE: 0.9 mg/dL (ref 0.6–1.1)
EGFR: 64 mL/min/{1.73_m2} — ABNORMAL LOW (ref >90)
Glucose: 131 mg/dl (ref 70–140)
Potassium: 4.2 mEq/L (ref 3.5–5.1)
Sodium: 129 mEq/L — ABNORMAL LOW (ref 136–145)
TOTAL PROTEIN: 6.8 g/dL (ref 6.4–8.3)
Total Bilirubin: 0.39 mg/dL (ref 0.20–1.20)

## 2017-02-22 LAB — CBC WITH DIFFERENTIAL/PLATELET
BASO%: 0.4 % (ref 0.0–2.0)
Basophils Absolute: 0 10*3/uL (ref 0.0–0.1)
EOS%: 0 % (ref 0.0–7.0)
Eosinophils Absolute: 0 10*3/uL (ref 0.0–0.5)
HEMATOCRIT: 30.3 % — AB (ref 34.8–46.6)
HGB: 10.1 g/dL — ABNORMAL LOW (ref 11.6–15.9)
LYMPH#: 1.1 10*3/uL (ref 0.9–3.3)
LYMPH%: 9.4 % — ABNORMAL LOW (ref 14.0–49.7)
MCH: 29.2 pg (ref 25.1–34.0)
MCHC: 33.2 g/dL (ref 31.5–36.0)
MCV: 87.9 fL (ref 79.5–101.0)
MONO#: 1.2 10*3/uL — AB (ref 0.1–0.9)
MONO%: 9.9 % (ref 0.0–14.0)
NEUT%: 80.3 % — AB (ref 38.4–76.8)
NEUTROS ABS: 9.7 10*3/uL — AB (ref 1.5–6.5)
Platelets: 526 10*3/uL — ABNORMAL HIGH (ref 145–400)
RBC: 3.45 10*6/uL — AB (ref 3.70–5.45)
RDW: 18 % — ABNORMAL HIGH (ref 11.2–14.5)
WBC: 12.1 10*3/uL — AB (ref 3.9–10.3)

## 2017-02-22 MED ORDER — SODIUM CHLORIDE 0.9% FLUSH
10.0000 mL | INTRAVENOUS | Status: DC | PRN
  Administered 2017-02-22: 10 mL
  Filled 2017-02-22: qty 10

## 2017-02-22 MED ORDER — PALONOSETRON HCL INJECTION 0.25 MG/5ML
INTRAVENOUS | Status: AC
  Filled 2017-02-22: qty 5

## 2017-02-22 MED ORDER — FAMOTIDINE IN NACL 20-0.9 MG/50ML-% IV SOLN
INTRAVENOUS | Status: AC
  Filled 2017-02-22: qty 50

## 2017-02-22 MED ORDER — CARBOPLATIN CHEMO INTRADERMAL TEST DOSE 100MCG/0.02ML
100.0000 ug | Freq: Once | INTRADERMAL | Status: AC
  Administered 2017-02-22: 100 ug via INTRADERMAL
  Filled 2017-02-22: qty 0.02

## 2017-02-22 MED ORDER — SODIUM CHLORIDE 0.9% FLUSH
10.0000 mL | Freq: Once | INTRAVENOUS | Status: AC
  Administered 2017-02-22: 10 mL
  Filled 2017-02-22: qty 10

## 2017-02-22 MED ORDER — FAMOTIDINE IN NACL 20-0.9 MG/50ML-% IV SOLN
20.0000 mg | Freq: Once | INTRAVENOUS | Status: AC
  Administered 2017-02-22: 20 mg via INTRAVENOUS

## 2017-02-22 MED ORDER — HEPARIN SOD (PORK) LOCK FLUSH 100 UNIT/ML IV SOLN
500.0000 [IU] | Freq: Once | INTRAVENOUS | Status: AC | PRN
  Administered 2017-02-22: 500 [IU]
  Filled 2017-02-22: qty 5

## 2017-02-22 MED ORDER — SODIUM CHLORIDE 0.9 % IV SOLN
Freq: Once | INTRAVENOUS | Status: AC
  Administered 2017-02-22: 16:00:00 via INTRAVENOUS
  Filled 2017-02-22: qty 5

## 2017-02-22 MED ORDER — SODIUM CHLORIDE 0.9 % IV SOLN
Freq: Once | INTRAVENOUS | Status: AC
  Administered 2017-02-22: 15:00:00 via INTRAVENOUS

## 2017-02-22 MED ORDER — CARBOPLATIN CHEMO INJECTION 600 MG/60ML
400.0000 mg | Freq: Once | INTRAVENOUS | Status: AC
  Administered 2017-02-22: 400 mg via INTRAVENOUS
  Filled 2017-02-22: qty 40

## 2017-02-22 MED ORDER — HEPARIN SOD (PORK) LOCK FLUSH 100 UNIT/ML IV SOLN
500.0000 [IU] | Freq: Once | INTRAVENOUS | Status: AC
  Administered 2017-02-22: 500 [IU]
  Filled 2017-02-22: qty 5

## 2017-02-22 MED ORDER — SODIUM CHLORIDE 0.9 % IV SOLN
1100.0000 mg | Freq: Once | INTRAVENOUS | Status: AC
  Administered 2017-02-22: 1100 mg via INTRAVENOUS
  Filled 2017-02-22: qty 28.93

## 2017-02-22 MED ORDER — PALONOSETRON HCL INJECTION 0.25 MG/5ML
0.2500 mg | Freq: Once | INTRAVENOUS | Status: AC
  Administered 2017-02-22: 0.25 mg via INTRAVENOUS

## 2017-02-22 NOTE — Patient Instructions (Signed)
[facility] Discharge Instructions for Patients Receiving Chemotherapy  Today you received the following chemotherapy agents: Gemzar and Carboplatin   To help prevent nausea and vomiting after your treatment, we encourage you to take your nausea medication as directed.    If you develop nausea and vomiting that is not controlled by your nausea medication, call the clinic.   BELOW ARE SYMPTOMS THAT SHOULD BE REPORTED IMMEDIATELY:  *FEVER GREATER THAN 100.5 F  *CHILLS WITH OR WITHOUT FEVER  NAUSEA AND VOMITING THAT IS NOT CONTROLLED WITH YOUR NAUSEA MEDICATION  *UNUSUAL SHORTNESS OF BREATH  *UNUSUAL BRUISING OR BLEEDING  TENDERNESS IN MOUTH AND THROAT WITH OR WITHOUT PRESENCE OF ULCERS  *URINARY PROBLEMS  *BOWEL PROBLEMS  UNUSUAL RASH Items with * indicate a potential emergency and should be followed up as soon as possible.  Feel free to call the clinic you have any questions or concerns. The clinic phone number is [phone].  Please show the CHEMO ALERT CARD at check-in to the Emergency Department and triage nurse.   

## 2017-02-22 NOTE — Progress Notes (Signed)
Carboplatin skin test dose was positive after the 30 minute reading. The area was not raised, doesn't itch and was light pinkish in color. In Dr. Calton Dach absence, Dr. Alen Blew said."let's give her Pepcid IV and go ahead and treat." Order entered and infusion room nurse notified.

## 2017-02-22 NOTE — Progress Notes (Signed)
Removed heparin 10 ml ( 500 units ) then flushed with saline again as pt scheduled for treatment today.

## 2017-03-01 ENCOUNTER — Ambulatory Visit: Payer: Self-pay

## 2017-03-01 ENCOUNTER — Ambulatory Visit (HOSPITAL_BASED_OUTPATIENT_CLINIC_OR_DEPARTMENT_OTHER): Payer: Self-pay

## 2017-03-01 ENCOUNTER — Ambulatory Visit (HOSPITAL_BASED_OUTPATIENT_CLINIC_OR_DEPARTMENT_OTHER): Payer: Self-pay | Admitting: Hematology and Oncology

## 2017-03-01 ENCOUNTER — Encounter: Payer: Self-pay | Admitting: Hematology and Oncology

## 2017-03-01 ENCOUNTER — Other Ambulatory Visit (HOSPITAL_BASED_OUTPATIENT_CLINIC_OR_DEPARTMENT_OTHER): Payer: Self-pay

## 2017-03-01 VITALS — BP 109/66 | HR 91 | Temp 98.9°F | Resp 20 | Ht 63.0 in | Wt 165.0 lb

## 2017-03-01 DIAGNOSIS — C801 Malignant (primary) neoplasm, unspecified: Principal | ICD-10-CM

## 2017-03-01 DIAGNOSIS — C569 Malignant neoplasm of unspecified ovary: Secondary | ICD-10-CM

## 2017-03-01 DIAGNOSIS — Z5111 Encounter for antineoplastic chemotherapy: Secondary | ICD-10-CM

## 2017-03-01 DIAGNOSIS — E440 Moderate protein-calorie malnutrition: Secondary | ICD-10-CM

## 2017-03-01 DIAGNOSIS — K5900 Constipation, unspecified: Secondary | ICD-10-CM

## 2017-03-01 DIAGNOSIS — C786 Secondary malignant neoplasm of retroperitoneum and peritoneum: Secondary | ICD-10-CM

## 2017-03-01 DIAGNOSIS — Z95828 Presence of other vascular implants and grafts: Secondary | ICD-10-CM

## 2017-03-01 DIAGNOSIS — D649 Anemia, unspecified: Secondary | ICD-10-CM

## 2017-03-01 DIAGNOSIS — R180 Malignant ascites: Secondary | ICD-10-CM

## 2017-03-01 DIAGNOSIS — D630 Anemia in neoplastic disease: Secondary | ICD-10-CM

## 2017-03-01 DIAGNOSIS — G893 Neoplasm related pain (acute) (chronic): Secondary | ICD-10-CM

## 2017-03-01 LAB — CBC WITH DIFFERENTIAL/PLATELET
BASO%: 0.2 % (ref 0.0–2.0)
Basophils Absolute: 0 10*3/uL (ref 0.0–0.1)
EOS ABS: 0 10*3/uL (ref 0.0–0.5)
EOS%: 0.4 % (ref 0.0–7.0)
HEMATOCRIT: 28.4 % — AB (ref 34.8–46.6)
HEMOGLOBIN: 9.3 g/dL — AB (ref 11.6–15.9)
LYMPH%: 24.8 % (ref 14.0–49.7)
MCH: 28.9 pg (ref 25.1–34.0)
MCHC: 32.7 g/dL (ref 31.5–36.0)
MCV: 88.2 fL (ref 79.5–101.0)
MONO#: 0.2 10*3/uL (ref 0.1–0.9)
MONO%: 3.7 % (ref 0.0–14.0)
NEUT%: 70.9 % (ref 38.4–76.8)
NEUTROS ABS: 3.5 10*3/uL (ref 1.5–6.5)
Platelets: 192 10*3/uL (ref 145–400)
RBC: 3.22 10*6/uL — ABNORMAL LOW (ref 3.70–5.45)
RDW: 15.9 % — AB (ref 11.2–14.5)
WBC: 4.9 10*3/uL (ref 3.9–10.3)
lymph#: 1.2 10*3/uL (ref 0.9–3.3)

## 2017-03-01 LAB — COMPREHENSIVE METABOLIC PANEL
ALBUMIN: 2.5 g/dL — AB (ref 3.5–5.0)
ALK PHOS: 121 U/L (ref 40–150)
ALT: 21 U/L (ref 0–55)
AST: 24 U/L (ref 5–34)
Anion Gap: 9 mEq/L (ref 3–11)
BILIRUBIN TOTAL: 0.35 mg/dL (ref 0.20–1.20)
BUN: 15.3 mg/dL (ref 7.0–26.0)
CALCIUM: 9 mg/dL (ref 8.4–10.4)
CO2: 26 mEq/L (ref 22–29)
Chloride: 99 mEq/L (ref 98–109)
Creatinine: 0.7 mg/dL (ref 0.6–1.1)
EGFR: 85 mL/min/{1.73_m2} — AB (ref >90)
GLUCOSE: 121 mg/dL (ref 70–140)
Potassium: 3.7 mEq/L (ref 3.5–5.1)
Sodium: 133 mEq/L — ABNORMAL LOW (ref 136–145)
TOTAL PROTEIN: 6.5 g/dL (ref 6.4–8.3)

## 2017-03-01 MED ORDER — SODIUM CHLORIDE 0.9% FLUSH
10.0000 mL | INTRAVENOUS | Status: DC | PRN
  Administered 2017-03-01: 10 mL
  Filled 2017-03-01: qty 10

## 2017-03-01 MED ORDER — MIRTAZAPINE 15 MG PO TABS: 15.0000 mg | ORAL_TABLET | Freq: Every day | ORAL | 9 refills | Status: AC

## 2017-03-01 MED ORDER — ONDANSETRON HCL 4 MG/2ML IJ SOLN
INTRAMUSCULAR | Status: AC
  Filled 2017-03-01: qty 4

## 2017-03-01 MED ORDER — SODIUM CHLORIDE 0.9 % IV SOLN
Freq: Once | INTRAVENOUS | Status: AC
  Administered 2017-03-01: 14:00:00 via INTRAVENOUS

## 2017-03-01 MED ORDER — SODIUM CHLORIDE 0.9% FLUSH
10.0000 mL | Freq: Once | INTRAVENOUS | Status: AC
  Administered 2017-03-01: 10 mL
  Filled 2017-03-01: qty 10

## 2017-03-01 MED ORDER — LACTULOSE 10 GM/15ML PO SOLN: 10.0000 g | Freq: Three times a day (TID) | ORAL | 3 refills | Status: AC

## 2017-03-01 MED ORDER — ONDANSETRON HCL 4 MG/2ML IJ SOLN
8.0000 mg | Freq: Once | INTRAMUSCULAR | Status: AC
  Administered 2017-03-01: 8 mg via INTRAVENOUS

## 2017-03-01 MED ORDER — OXYCODONE HCL 10 MG PO TABS: 10.0000 mg | ORAL_TABLET | ORAL | 0 refills | Status: AC | PRN

## 2017-03-01 MED ORDER — SODIUM CHLORIDE 0.9 % IV SOLN
1100.0000 mg | Freq: Once | INTRAVENOUS | Status: AC
  Administered 2017-03-01: 1100 mg via INTRAVENOUS
  Filled 2017-03-01: qty 28.93

## 2017-03-01 MED ORDER — HEPARIN SOD (PORK) LOCK FLUSH 100 UNIT/ML IV SOLN
500.0000 [IU] | Freq: Once | INTRAVENOUS | Status: AC | PRN
  Administered 2017-03-01: 500 [IU]
  Filled 2017-03-01: qty 5

## 2017-03-01 MED FILL — oxyCODONE HCL 10 MG TABS: 10 | 10 days supply | Qty: 60 | Fill #0

## 2017-03-01 MED FILL — LACTULOSE 10 GM/15 ML SOLN: 10 | 5 days supply | Qty: 240 | Fill #0

## 2017-03-01 MED FILL — MIRTAZAPINE 15 MG TAB: 15 | 30 days supply | Qty: 30 | Fill #0

## 2017-03-01 NOTE — Patient Instructions (Signed)
[facility] Discharge Instructions for Patients Receiving Chemotherapy  Today you received the following chemotherapy agents gemzar  To help prevent nausea and vomiting after your treatment, we encourage you to take your nausea medication as directed.  If you develop nausea and vomiting that is not controlled by your nausea medication, call the clinic.   BELOW ARE SYMPTOMS THAT SHOULD BE REPORTED IMMEDIATELY:  *FEVER GREATER THAN 100.5 F  *CHILLS WITH OR WITHOUT FEVER  NAUSEA AND VOMITING THAT IS NOT CONTROLLED WITH YOUR NAUSEA MEDICATION  *UNUSUAL SHORTNESS OF BREATH  *UNUSUAL BRUISING OR BLEEDING  TENDERNESS IN MOUTH AND THROAT WITH OR WITHOUT PRESENCE OF ULCERS  *URINARY PROBLEMS  *BOWEL PROBLEMS  UNUSUAL RASH Items with * indicate a potential emergency and should be followed up as soon as possible.  Feel free to call the clinic you have any questions or concerns. The clinic phone number is [phone].  

## 2017-03-02 ENCOUNTER — Ambulatory Visit (HOSPITAL_COMMUNITY)
Admission: RE | Admit: 2017-03-02 | Discharge: 2017-03-02 | Disposition: A | Discharge status: Home / Self Care | Payer: Self-pay | Source: Ambulatory Visit | Attending: Hematology and Oncology | Admitting: Hematology and Oncology

## 2017-03-02 ENCOUNTER — Encounter: Payer: Self-pay | Admitting: Hematology and Oncology

## 2017-03-02 DIAGNOSIS — C786 Secondary malignant neoplasm of retroperitoneum and peritoneum: Secondary | ICD-10-CM | POA: Insufficient documentation

## 2017-03-02 DIAGNOSIS — D630 Anemia in neoplastic disease: Secondary | ICD-10-CM | POA: Insufficient documentation

## 2017-03-02 DIAGNOSIS — R180 Malignant ascites: Secondary | ICD-10-CM | POA: Insufficient documentation

## 2017-03-02 DIAGNOSIS — K5900 Constipation, unspecified: Secondary | ICD-10-CM | POA: Insufficient documentation

## 2017-03-02 DIAGNOSIS — C801 Malignant (primary) neoplasm, unspecified: Secondary | ICD-10-CM | POA: Insufficient documentation

## 2017-03-02 MED ORDER — LIDOCAINE HCL 2 % IJ SOLN
INTRAMUSCULAR | Status: AC
  Filled 2017-03-02: qty 10

## 2017-03-02 NOTE — Assessment & Plan Note (Signed)

## 2017-03-02 NOTE — Progress Notes (Signed)
[facility] OFFICE PROGRESS NOTE  Patient Care Team: Default, Provider, MD as PCP - General  SUMMARY OF ONCOLOGIC HISTORY:   Ovarian carcinoma ([place])   04/19/2015 Initial Diagnosis    In late 03-2015 she in [place] and was found to have large pelvic mass and perihepatic ascites. CA 125 then was 359, with CEA of 3, CA 19-9 5.8 and alpha fetoprotein 2.3. She had exploratory laparotomy 04-19-15, not done by gyn oncologist, with bilateral ovaried an tumors, 10 cm with capsule ruptured on right , smaller on left, peritoneal carcinomatosis.       05/20/2015 Miscellaneous    She had some bowel obstruction post operatively, with NG used . Pathology reportedcystic and solid left ovarian mass, 13 cm right ovarian mass, moderately differentiated tumor "tumor de celulas de la granulosa". 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 CT AP 05-2015 had ascites, carcinomatosis and multiple peritoneal implants. CA 125 on 05-24-15 baseline for chemo was 141. She received 3 cycles of taxol + carboplatin from 05-26-15 thru 07-07-15 (not clear if avastin given those cycles), after which carboplatin was not available in [place]. She received taxol 175 mg/m2, CDDP and avastin for 3 additional cycles thru 10-07-15. Second look laparotomy was discussed, patient declined, and instead had 4 cycles avastin + taxol from 12-01-15 thru 02-02-16 (apparentlyno CDDP after 10-07-15 and no carboplatin after 07-07-15). CT CAP 02-24-16 reportedly had no evidence of malignancy, with some bullous emphysema, tortuous sigmoid colon with diverticulosis. . Recommendation was for maintenance avastin x 1 year, however patient then came to [place] to be with family.       05/20/2016 Imaging    Extensive peritoneal disease with omental thickening/ 18 and large volume ascites. 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 Trace bilateral  pleural effusions. Moderate hiatal hernia. Bilateral inguinal hernias containing fat. Left colonic diverticulosis. Aortoiliac atherosclerosis.      05/20/2016 - 06/13/2016 Hospital Admission    76 year old female with a history of metastatic ovarian cancer diagnosed in January 2017 (s/p primary debulking surgery followed by 8 adjuvant cycles of carboplatin and paclitaxel and 4 additional cycles of carboplatin, paclitaxel, avastin), last chemo Sept 2017, presented with progressive abdominal bloating and pain for 3 weeks duration. She was feeling well until the first week of December, 2017 when she was sick with a virus from which she never recovered. She states that she began feeling distended in the abdomen and uncomfortable and was taken to the ED on 05/20/16. CT chest/abd/pelvis9-21-17 reportedly with no evidence of malignancy, with some bullous emphysema, tortuous sigmoid colon with diverticulosis. Recommendation was for maintenance avastin x 1 year, however patient then came to [facility] in 02-2016. She has not established care with any physician since coming to [place]. Unfortunately, after paracentesis she began having increasing abdominal pain. Abdominal x-ray revealed distended small bowel loops concerning for small bowel obstruction. Gen. surgery was consulted. NG was inserted to suction. Because of prolonged Hospitalization she was placed on TPN/TNA. She underwent Chemotheray with Doxil on 12/26. Repeat U/S 12/31 showed interval progression of ascites and She is s/p repeat Paracentesis 06-06-16 which removed 4 liters. She was subsequently discharged      05/21/2016 Tumor Marker    Patient's tumor was tested for the following markers: CA125 Results of the tumor marker test revealed 249.8      05/22/2016 Procedure    Successful ultrasound-guided paracentesis yielding 2.9 liters of peritoneal fluid.  05/22/2016 Pathology Results    PERITONEAL/ASCITIC FLUID, 1 OF 1 COLLECTED ON 05/22/16:  ADENOCARCINOMA. ADDENDUM: Immunohistochemistry is performed on the cell block and the tumor is strongly positive with cytokeratin 7 and cytokeratin 8/18 and shows patchy positivity with estrogen receptor, cytokeratin 5/6, WT-1 and MOC-31 with focal weak positivity with PAX-8. The tumor is negative with thyroglobulin, thyroid transcription factor-1, Napsin-A, progesterone receptor, GATA 3, gross cystic disease fluid protein, cytokeratin 20, CDX-2 and Calretinin. 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       05/24/2016 Procedure    Ultrasound and fluoroscopically guided right internal jugular single lumen power port catheter insertion. Tip in the SVC/RA junction. Catheter ready for use      05/24/2016 Imaging    LV EF: 65% -   70%      05/28/2016 Imaging    Small volume of abdominal and pelvic ascites is identified and inadequate for paracentesis.      05/30/2016 - 08/21/2016 Chemotherapy    She received doxorubicin x 3 cycles, stopped due to disease progression      06/04/2016 Imaging    Increased amount of ascites, freely distributed in all 4 quadrants. No other acute finding by ultrasound.      06/06/2016 Imaging    Small left greater than right pleural effusions, increased compared to prior CT. Interim development of partial consolidations within the bilateral lower lobes which may reflect atelectasis or pneumonia. Few scattered foci of ground-glass density within the right upper and left upper lobes, suspect small foci of inflammation or infection. 2. Moderate to large volume of ascites in the upper abdomen with extensive mesenteric and omental metastatic disease partially visualized. 3. Dilated gallbladder.      06/08/2016 Procedure    Successful ultrasound and fluoroscopic guided placement of a tunneled peritoneal drainage catheter. 2.  Successful aspiration of 900 cc of serous ascites following tunneled peritoneal drainage catheter placement.      06/19/2016 Tumor Marker    Patient's tumor was tested for the following markers: CA125 Results of the tumor marker test revealed 166.6      07/24/2016 Tumor Marker    Patient's tumor was tested for the following markers: CA125 Results of the tumor marker test revealed 104.4      08/18/2016 Imaging    Extensive peritoneal metastatic disease within the abdomen. This is somewhat difficult to compare with prior given diffuse nature however there is suggestion of interval progression. Interval resolution of previously described bilateral pleural effusions. Peritoneal drainage catheter is present with tip terminating in the right upper quadrant. Interval decrease in now small volume ascites. Aortic atherosclerosis. Diverticulosis without CT evidence for acute diverticulitis.      08/18/2016 Imaging    LV EF: 60% -  65%      08/21/2016 Tumor Marker    Patient's tumor was tested for the following markers: CA125 Results of the tumor marker test revealed 96.1      08/29/2016 - 12/26/2016 Chemotherapy    She received gemzar and carboplatin      09/05/2016 Tumor Marker    Patient's tumor was tested for the following markers: CA125 Results of the tumor marker test revealed 48.1      09/20/2016 Procedure    Status post bedside removal of tunneled right-sided peritoneal drain which was removed in its entirety.      10/03/2016 Tumor Marker    Patient's tumor  was tested for the following markers: CA125 Results of the tumor marker test revealed 22.9      11/03/2016 Imaging    Interval decrease in peritoneal metastatic disease when compared with prior exam. Aortic atherosclerosis. Sigmoid colonic diverticulosis.      11/07/2016 Tumor Marker    Patient's tumor was tested for the following markers: CA125 Results of the tumor marker test revealed 9.6      12/05/2016 Tumor Marker     Patient's tumor was tested for the following markers: CA125 Results of the tumor marker test revealed 9.6      12/19/2016 Tumor Marker    Patient's tumor was tested for the following markers: CA-125 Results of the tumor marker test revealed 8.      01/08/2017 Imaging    1. Disease described previously in the central small bowel mesentery is now noted to be omental. Omental positioning is quite different between the 2 studies making reproducible measurement impossible, but the confluent soft tissue attenuation and bulkiness of this disease appears qualitatively decreased in the interval. Despite the improvement in the central omental disease, there is a new 1.6 cm left omental nodule towards the splenic flexure which cannot be identified on the previous study, concerning for new area of focal progression. 2. Trace free fluid in the pelvis. 3. Small groin hernias contain only fat. 4. Left colonic diverticulosis without diverticulitis. 5. Aortic Atherosclerois (ICD10-170.0)      02/07/2017 Tumor Marker    Patient's tumor was tested for the following markers: CA125 Results of the tumor marker test revealed 98.6      02/07/2017 Imaging    1. Normal bowel gas pattern. 2. Stool distends the ascending colon. No generalized stool retention or rectal impaction      02/13/2017 Imaging    Ct chest, abdomen and pelvis: 1. There has been a mixed interval response to therapy. 2. Large central abdominal mass demonstrates significant decrease in volume from previous exam. 3. Enlarging peritoneal lesion within the right posterior pelvis. A new lesion is identified along the right pericolic gutter. 4. Similar volume of ascites within the abdomen or pelvis      02/14/2017 Procedure    Successful ultrasound-guided therapeutic paracentesis yielding 2.6 liters of peritoneal fluid      02/20/2017 Procedure    Successful ultrasound-guided paracentesis yielding 2.7 liters of peritoneal fluid        INTERVAL HISTORY: Please see below for problem oriented charting. She returns for further follow-up Her daughter serves as an interpreter She is having a lot of pain She is taking oxycodone on a regular basis and it does not appear to control her pain well She denies significant constipation She has significant abdominal discomfort No nausea vomiting She has lost some weight and not eating well Her daughter is concerned about depression The patient denies suicidal ideation  REVIEW OF SYSTEMS:   Constitutional: Denies fevers, chills Eyes: Denies blurriness of vision Ears, nose, mouth, throat, and face: Denies mucositis or sore throat Respiratory: Denies cough, dyspnea or wheezes Cardiovascular: Denies palpitation, chest discomfort or lower extremity swelling Skin: Denies abnormal skin rashes Lymphatics: Denies new lymphadenopathy or easy bruising Neurological:Denies numbness, tingling or new weaknesses All other systems were reviewed with the patient and are negative.  I have reviewed the past medical history, past surgical history, social history and family history with the patient and they are unchanged from previous note.  ALLERGIES:  has No Known Allergies.  MEDICATIONS:  Current Outpatient Prescriptions  Medication Sig  Dispense Refill  . lactulose (CHRONULAC) 10 GM/15ML solution Take 15 mLs (10 g total) by mouth 3 (three) times daily. 240 mL 3  . lidocaine-prilocaine (EMLA) cream Apply to port 1 hour before access with needle. Cover with plastic wrap. (Patient not taking: Reported on 11/07/2016) 30 g 11  . mirtazapine (REMERON) 15 MG tablet Take 1 tablet (15 mg total) by mouth at bedtime. 30 tablet 9  . ondansetron (ZOFRAN ODT) 8 MG disintegrating tablet Take 1 tablet (8 mg total) by mouth every 8 (eight) hours as needed for nausea or vomiting. (Patient not taking: Reported on 11/07/2016) 30 tablet 0  . oxyCODONE 10 MG TABS Take 1 tablet (10 mg total) by mouth every 4 (four)  hours as needed for severe pain. 60 tablet 0  . promethazine (PHENERGAN) 25 MG tablet Take 1 tablet (25 mg total) by mouth every 6 (six) hours as needed for nausea or vomiting. (Patient not taking: Reported on 11/07/2016) 30 tablet 9   No current facility-administered medications for this visit.    Facility-Administered Medications Ordered in Other Visits  Medication Dose Route Frequency Provider Last Rate Last Dose  . lidocaine (XYLOCAINE) 2 % (with pres) injection             PHYSICAL EXAMINATION: ECOG PERFORMANCE STATUS: 2 - Symptomatic, <50% confined to bed  Vitals:   03/01/17 1314  BP: 109/66  Pulse: 91  Resp: 20  Temp: 98.9 F (37.2 C)  SpO2: 99%   Filed Weights   03/01/17 1314  Weight: 165 lb (74.8 kg)    GENERAL:alert, no distress and comfortable SKIN: skin color, texture, turgor are normal, no rashes or significant lesions EYES: normal, Conjunctiva are pink and non-injected, sclera clear OROPHARYNX:no exudate, no erythema and lips, buccal mucosa, and tongue normal  NECK: supple, thyroid normal size, non-tender, without nodularity LYMPH:  no palpable lymphadenopathy in the cervical, axillary or inguinal LUNGS: clear to auscultation and percussion with normal breathing effort HEART: regular rate & rhythm and no murmurs and no lower extremity edema ABDOMEN:abdomen soft, distended with ascites.  No pain on deep palpation Musculoskeletal:no cyanosis of digits and no clubbing  NEURO: alert & oriented x 3 with fluent speech, no focal motor/sensory deficits  LABORATORY DATA:  I have reviewed the data as listed    Component Value Date/Time   NA 133 (L) 03/01/2017 1242   K 3.7 03/01/2017 1242   CL 98 (L) 02/13/2017 1245   CO2 26 03/01/2017 1242   GLUCOSE 121 03/01/2017 1242   BUN 15.3 03/01/2017 1242   CREATININE 0.7 03/01/2017 1242   CALCIUM 9.0 03/01/2017 1242   PROT 6.5 03/01/2017 1242   ALBUMIN 2.5 (L) 03/01/2017 1242   AST 24 03/01/2017 1242   ALT 21 03/01/2017  1242   ALKPHOS 121 03/01/2017 1242   BILITOT 0.35 03/01/2017 1242   GFRNONAA >60 02/13/2017 1245   GFRAA >60 02/13/2017 1245    No results found for: SPEP, UPEP  Lab Results  Component Value Date   WBC 4.9 03/01/2017   NEUTROABS 3.5 03/01/2017   HGB 9.3 (L) 03/01/2017   HCT 28.4 (L) 03/01/2017   MCV 88.2 03/01/2017   PLT 192 03/01/2017      Chemistry      Component Value Date/Time   NA 133 (L) 03/01/2017 1242   K 3.7 03/01/2017 1242   CL 98 (L) 02/13/2017 1245   CO2 26 03/01/2017 1242   BUN 15.3 03/01/2017 1242   CREATININE 0.7 03/01/2017 1242  Component Value Date/Time   CALCIUM 9.0 03/01/2017 1242   ALKPHOS 121 03/01/2017 1242   AST 24 03/01/2017 1242   ALT 21 03/01/2017 1242   BILITOT 0.35 03/01/2017 1242       RADIOGRAPHIC STUDIES: I have personally reviewed the radiological images as listed and agreed with the findings in the report. Ct Chest W Contrast  Result Date: 02/13/2017 CLINICAL DATA:  Diffuse abdominal pain. EXAM: CT CHEST, ABDOMEN, AND PELVIS WITH CONTRAST TECHNIQUE: Multidetector CT imaging of the chest, abdomen and pelvis was performed following the standard protocol during bolus administration of intravenous contrast. CONTRAST:  140mL ISOVUE-300 IOPAMIDOL (ISOVUE-300) INJECTION 61% COMPARISON:  08/18/2016 FINDINGS: CT CHEST FINDINGS Cardiovascular: The heart size is normal. Aortic atherosclerosis noted. Calcification within the LAD coronary artery noted. Mediastinum/Nodes: No enlarged mediastinal, hilar, or axillary lymph nodes. Thyroid gland, trachea, and esophagus demonstrate no significant findings. Lungs/Pleura: No pleural effusion. Atelectasis versus scar noted within both lung bases. No suspicious pulmonary nodules identified. Musculoskeletal: No chest wall mass or suspicious bone lesions identified. CT ABDOMEN PELVIS FINDINGS Hepatobiliary: No suspicious parenchymal abnormality identified. Gallbladder normal. No biliary dilatation. Pancreas:  Unremarkable. No pancreatic ductal dilatation or surrounding inflammatory changes. Spleen: Normal in size without focal abnormality. Adrenals/Urinary Tract: The adrenal glands are normal. The kidneys are unremarkable. No hydronephrosis. Urinary bladder is normal. Stomach/Bowel: The stomach appears within normal limits. No pathologic dilatation of the large or small bowel loops. Colonic diverticulosis noted without acute inflammation. Vascular/Lymphatic: Aortic atherosclerosis noted. No aneurysm. No upper abdominal adenopathy. No iliac or inguinal adenopathy. Reproductive: Status post hysterectomy. Other: Similar volume of abdominal and pelvic ascites. The index lesion demonstrates significant interval decrease in volume from previous exam. Currently 5.5 x 2.2 cm, image 681 of series 2. Previously this measured 17.9 x 5.2 cm. Within the right posterior pelvis there is a lesion measuring 6.7 cm, image 115 of series 2. Previously this measured 1.5 cm. New lesion within the right pericolic gutter measures 2.1 cm, 86 of series 2. Musculoskeletal: No aggressive lytic or sclerotic bone lesions. IMPRESSION: 1. There has been a mixed interval response to therapy. 2. Large central abdominal mass demonstrates significant decrease in volume from previous exam. 3. Enlarging peritoneal lesion within the right posterior pelvis. A new lesion is identified along the right pericolic gutter. 4. Similar volume of ascites within the abdomen or pelvis. Electronically Signed   By: Kerby Moors M.D.   On: 02/13/2017 14:49   Ct Abdomen Pelvis W Contrast  Result Date: 02/13/2017 CLINICAL DATA:  Diffuse abdominal pain. EXAM: CT CHEST, ABDOMEN, AND PELVIS WITH CONTRAST TECHNIQUE: Multidetector CT imaging of the chest, abdomen and pelvis was performed following the standard protocol during bolus administration of intravenous contrast. CONTRAST:  156mL ISOVUE-300 IOPAMIDOL (ISOVUE-300) INJECTION 61% COMPARISON:  08/18/2016 FINDINGS: CT  CHEST FINDINGS Cardiovascular: The heart size is normal. Aortic atherosclerosis noted. Calcification within the LAD coronary artery noted. Mediastinum/Nodes: No enlarged mediastinal, hilar, or axillary lymph nodes. Thyroid gland, trachea, and esophagus demonstrate no significant findings. Lungs/Pleura: No pleural effusion. Atelectasis versus scar noted within both lung bases. No suspicious pulmonary nodules identified. Musculoskeletal: No chest wall mass or suspicious bone lesions identified. CT ABDOMEN PELVIS FINDINGS Hepatobiliary: No suspicious parenchymal abnormality identified. Gallbladder normal. No biliary dilatation. Pancreas: Unremarkable. No pancreatic ductal dilatation or surrounding inflammatory changes. Spleen: Normal in size without focal abnormality. Adrenals/Urinary Tract: The adrenal glands are normal. The kidneys are unremarkable. No hydronephrosis. Urinary bladder is normal. Stomach/Bowel: The stomach appears within normal limits. No pathologic  dilatation of the large or small bowel loops. Colonic diverticulosis noted without acute inflammation. Vascular/Lymphatic: Aortic atherosclerosis noted. No aneurysm. No upper abdominal adenopathy. No iliac or inguinal adenopathy. Reproductive: Status post hysterectomy. Other: Similar volume of abdominal and pelvic ascites. The index lesion demonstrates significant interval decrease in volume from previous exam. Currently 5.5 x 2.2 cm, image 681 of series 2. Previously this measured 17.9 x 5.2 cm. Within the right posterior pelvis there is a lesion measuring 6.7 cm, image 115 of series 2. Previously this measured 1.5 cm. New lesion within the right pericolic gutter measures 2.1 cm, 86 of series 2. Musculoskeletal: No aggressive lytic or sclerotic bone lesions. IMPRESSION: 1. There has been a mixed interval response to therapy. 2. Large central abdominal mass demonstrates significant decrease in volume from previous exam. 3. Enlarging peritoneal lesion within  the right posterior pelvis. A new lesion is identified along the right pericolic gutter. 4. Similar volume of ascites within the abdomen or pelvis. Electronically Signed   By: Kerby Moors M.D.   On: 02/13/2017 14:49   US Paracentesis  Result Date: 02/20/2017 INDICATION: History of ovarian cancer with recurrent abdominal ascites. Request is made for therapeutic paracentesis. EXAM: ULTRASOUND GUIDED THERAPEUTIC PARACENTESIS MEDICATIONS: 2% xylocaine COMPLICATIONS: None immediate. PROCEDURE: Informed written consent was obtained from the patient after a discussion of the risks, benefits and alternatives to treatment. A timeout was performed prior to the initiation of the procedure. Initial ultrasound scanning demonstrates a small amount of ascites within the right upper abdominal quadrant. The right upper abdomen was prepped and draped in the usual sterile fashion. 2% xylocaine was used for local anesthesia. Following this, a 19 gauge, 7-cm, Yueh catheter was introduced. An ultrasound image was saved for documentation purposes. The paracentesis was performed. The catheter was removed and a dressing was applied. The patient tolerated the procedure well without immediate post procedural complication. FINDINGS: A total of approximately 2.7 L of bloody fluid was removed. IMPRESSION: Successful ultrasound-guided paracentesis yielding 2.7 liters of peritoneal fluid. Read by: Saverio Danker, PA-C Electronically Signed   By: Aletta Edouard M.D.   On: 02/20/2017 14:26   Dg Abd 2 Views  Result Date: 02/07/2017 CLINICAL DATA:  Peritoneal carcinomatosis.  Abdominal distention. EXAM: ABDOMEN - 2 VIEW COMPARISON:  CT from 01/08/2017 FINDINGS: Normal bowel gas pattern. No abnormal stool retention, with stool mainly seen in the proximal colon. No concerning mass effect or gas collection. No acute osseous finding. IMPRESSION: 1. Normal bowel gas pattern. 2. Stool distends the ascending colon. No generalized stool retention or  rectal impaction. Electronically Signed   By: Monte Fantasia M.D.   On: 02/07/2017 16:16   Ir Paracentesis  Result Date: 02/14/2017 INDICATION: History of ovarian cancer, recurrent ascites. Request is made for therapeutic paracentesis. EXAM: ULTRASOUND GUIDED THERAPEUTIC PARACENTESIS MEDICATIONS: 10 mL 1% lidocaine COMPLICATIONS: None immediate. PROCEDURE: Informed written consent was obtained from the patient after a discussion of the risks, benefits and alternatives to treatment. A timeout was performed prior to the initiation of the procedure. Initial ultrasound scanning demonstrates a large amount of ascites within the right lateral abdomen. The right lateral abdomen was prepped and draped in the usual sterile fashion. 1% lidocaine was used for local anesthesia. Following this, a 19 gauge, 7-cm, Yueh catheter was introduced, but was unable to pass through the peritoneal lining due to tenting. A 19 gauge, 10-cm, Yueh was then introduced. An ultrasound image was saved for documentation purposes. The paracentesis was performed. The catheter was removed  and a dressing was applied. The patient tolerated the procedure well without immediate post procedural complication. FINDINGS: A total of approximately 2.6 liters of bloody fluid was removed. IMPRESSION: Successful ultrasound-guided therapeutic paracentesis yielding 2.6 liters of peritoneal fluid. Read by:  Brynda Greathouse PA-C Electronically Signed   By: Aletta Edouard M.D.   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 The patient denies recent history of bleeding such as epistaxis, hematuria or hematochezia. She is asymptomatic from the anemia. I will observe for now.  She does not require transfusion now. I will continue the chemotherapy at current dose without dosage adjustment.  If the anemia gets progressive worse in the future, I might have to delay her treatment or adjust the chemotherapy dose.    Orders Placed This Encounter  Procedures  . US Paracentesis    Standing Status:   Future    Number of Occurrences:   1    Standing Expiration Date:   03/01/2018    Order Specific Question:   If therapeutic, is there a maximum amount of fluid to be removed?    Answer:   Yes    Comments:   5 liter    Order Specific Question:   What is the maximum amount of fluide to be removed?    Answer:   5 liter    Order Specific Question:   Are labs required for specimen collection?    Answer:   No    Order Specific Question:   Is Albumin medication needed?    Answer:   No    Order Specific Question:   Reason for Exam (SYMPTOM  OR DIAGNOSIS REQUIRED)    Answer:   for malignant ascites, therapeutic relief    Order Specific Question:   Preferred imaging location?    Answer:   [facility]   All questions were answered. The patient knows to call the clinic with any problems, questions or concerns. No barriers to learning was detected. I spent 30 minutes counseling the patient face to face. The total time spent in the  appointment was 40 minutes and more than 50% was on counseling and review of test results     Heath Lark, MD 03/02/2017 11:18 AM

## 2017-03-02 NOTE — Assessment & Plan Note (Signed)
Her pain is poorly controlled Recommend increase oxycodone as needed and I plan to reassess pain control in her next visit

## 2017-03-02 NOTE — Assessment & Plan Note (Signed)
She has recurrent malignant ascites I recommend therapeutic paracentesis for relief

## 2017-03-02 NOTE — Assessment & Plan Note (Signed)
She is malnourished and have malignant cachexia I plan to order mirtazapine I will reassess this next visit

## 2017-03-02 NOTE — Assessment & Plan Note (Signed)
She has irregular bowel habits She is at risk of constipation I recommend regular laxative therapy and have ordered lactulose for

## 2017-03-02 NOTE — Assessment & Plan Note (Signed)
She tolerated recent treatment well except for symptoms of abdominal bloating and pain which is due to abdominal disease We will proceed with treatment without dose adjustment today Due to progressive abdominal distention and ascites, I plan to order urgent therapeutic paracentesis in the near future

## 2017-03-02 NOTE — Procedures (Signed)
Ultrasound-guided therapeutic paracentesis performed yielding 3.4 liters of bloody  fluid. No immediate complications.

## 2017-03-05 ENCOUNTER — Other Ambulatory Visit: Payer: Self-pay

## 2017-03-06 ENCOUNTER — Ambulatory Visit: Payer: Self-pay | Admitting: Hematology

## 2017-03-12 ENCOUNTER — Other Ambulatory Visit: Payer: Self-pay | Admitting: Hematology and Oncology

## 2017-03-12 DIAGNOSIS — C569 Malignant neoplasm of unspecified ovary: Secondary | ICD-10-CM

## 2017-03-13 ENCOUNTER — Telehealth: Payer: Self-pay

## 2017-03-13 ENCOUNTER — Other Ambulatory Visit: Payer: Self-pay | Admitting: Hematology and Oncology

## 2017-03-13 DIAGNOSIS — C786 Secondary malignant neoplasm of retroperitoneum and peritoneum: Secondary | ICD-10-CM

## 2017-03-13 DIAGNOSIS — R180 Malignant ascites: Secondary | ICD-10-CM

## 2017-03-13 DIAGNOSIS — C801 Malignant (primary) neoplasm, unspecified: Principal | ICD-10-CM

## 2017-03-13 NOTE — Telephone Encounter (Signed)
S/w Audrey Garza per Dr Alvy Bimler attached message. She will expect call for scheduling paracentesis

## 2017-03-13 NOTE — Telephone Encounter (Signed)
Belenda Cruise called for pt. Pt has fluid again in belly. She has chemo on Thursday. She is asking if she can have paracentesis after chemo. 10/11 lab/ flush/ NG/ infusion. Belenda Cruise was not expecting to see Dr Alvy Bimler on 10/11, but is OK with it.

## 2017-03-13 NOTE — Telephone Encounter (Signed)
It is not recommended to do chemo and paracentesis same day I will order that to be done on Friday

## 2017-03-15 ENCOUNTER — Telehealth: Payer: Self-pay

## 2017-03-15 ENCOUNTER — Ambulatory Visit (HOSPITAL_BASED_OUTPATIENT_CLINIC_OR_DEPARTMENT_OTHER): Payer: Self-pay | Admitting: Medical

## 2017-03-15 ENCOUNTER — Other Ambulatory Visit: Payer: Self-pay | Admitting: Oncology

## 2017-03-15 ENCOUNTER — Ambulatory Visit (HOSPITAL_COMMUNITY): Payer: Self-pay

## 2017-03-15 ENCOUNTER — Other Ambulatory Visit: Payer: Self-pay | Admitting: *Deleted

## 2017-03-15 ENCOUNTER — Other Ambulatory Visit: Payer: Self-pay | Admitting: Hematology and Oncology

## 2017-03-15 ENCOUNTER — Ambulatory Visit (HOSPITAL_BASED_OUTPATIENT_CLINIC_OR_DEPARTMENT_OTHER): Payer: Self-pay

## 2017-03-15 ENCOUNTER — Ambulatory Visit (HOSPITAL_COMMUNITY)
Admission: RE | Admit: 2017-03-15 | Discharge: 2017-03-15 | Disposition: A | Discharge status: Home / Self Care | Payer: Self-pay | Source: Ambulatory Visit | Attending: Hematology and Oncology | Admitting: Hematology and Oncology

## 2017-03-15 ENCOUNTER — Ambulatory Visit (HOSPITAL_BASED_OUTPATIENT_CLINIC_OR_DEPARTMENT_OTHER): Payer: Self-pay | Admitting: Hematology and Oncology

## 2017-03-15 ENCOUNTER — Ambulatory Visit: Payer: Self-pay

## 2017-03-15 ENCOUNTER — Other Ambulatory Visit (HOSPITAL_BASED_OUTPATIENT_CLINIC_OR_DEPARTMENT_OTHER): Payer: Self-pay

## 2017-03-15 VITALS — BP 116/63 | HR 109 | Temp 98.5°F | Resp 20 | Ht 63.0 in | Wt 163.7 lb

## 2017-03-15 DIAGNOSIS — D6481 Anemia due to antineoplastic chemotherapy: Secondary | ICD-10-CM

## 2017-03-15 DIAGNOSIS — C569 Malignant neoplasm of unspecified ovary: Secondary | ICD-10-CM

## 2017-03-15 DIAGNOSIS — E440 Moderate protein-calorie malnutrition: Secondary | ICD-10-CM

## 2017-03-15 DIAGNOSIS — D630 Anemia in neoplastic disease: Secondary | ICD-10-CM

## 2017-03-15 DIAGNOSIS — D61818 Other pancytopenia: Secondary | ICD-10-CM

## 2017-03-15 DIAGNOSIS — D649 Anemia, unspecified: Secondary | ICD-10-CM

## 2017-03-15 DIAGNOSIS — R64 Cachexia: Secondary | ICD-10-CM

## 2017-03-15 DIAGNOSIS — T451X5A Adverse effect of antineoplastic and immunosuppressive drugs, initial encounter: Secondary | ICD-10-CM

## 2017-03-15 DIAGNOSIS — Z95828 Presence of other vascular implants and grafts: Secondary | ICD-10-CM

## 2017-03-15 DIAGNOSIS — R180 Malignant ascites: Secondary | ICD-10-CM

## 2017-03-15 DIAGNOSIS — G893 Neoplasm related pain (acute) (chronic): Secondary | ICD-10-CM

## 2017-03-15 LAB — URINALYSIS, COMPLETE (UACMP) WITH MICROSCOPIC
Bilirubin Urine: NEGATIVE
Glucose, UA: NEGATIVE mg/dL
Hgb urine dipstick: NEGATIVE
Ketones, ur: NEGATIVE mg/dL
Nitrite: NEGATIVE
PROTEIN: NEGATIVE mg/dL
SPECIFIC GRAVITY, URINE: 1.006 (ref 1.005–1.030)
pH: 6 (ref 5.0–8.0)

## 2017-03-15 LAB — CBC WITH DIFFERENTIAL/PLATELET
BASO%: 0 % (ref 0.0–2.0)
BASOS ABS: 0 10*3/uL (ref 0.0–0.1)
EOS%: 0 % (ref 0.0–7.0)
Eosinophils Absolute: 0 10*3/uL (ref 0.0–0.5)
HEMATOCRIT: 24.3 % — AB (ref 34.8–46.6)
HEMOGLOBIN: 7.6 g/dL — AB (ref 11.6–15.9)
LYMPH#: 1.1 10*3/uL (ref 0.9–3.3)
LYMPH%: 37.3 % (ref 14.0–49.7)
MCH: 27.7 pg (ref 25.1–34.0)
MCHC: 31.3 g/dL — ABNORMAL LOW (ref 31.5–36.0)
MCV: 88.7 fL (ref 79.5–101.0)
MONO#: 1.2 10*3/uL — ABNORMAL HIGH (ref 0.1–0.9)
MONO%: 39.3 % — ABNORMAL HIGH (ref 0.0–14.0)
NEUT%: 23.4 % — ABNORMAL LOW (ref 38.4–76.8)
NEUTROS ABS: 0.7 10*3/uL — AB (ref 1.5–6.5)
Platelets: 433 10*3/uL — ABNORMAL HIGH (ref 145–400)
RBC: 2.74 10*6/uL — ABNORMAL LOW (ref 3.70–5.45)
RDW: 18.9 % — AB (ref 11.2–14.5)
WBC: 3 10*3/uL — ABNORMAL LOW (ref 3.9–10.3)

## 2017-03-15 LAB — COMPREHENSIVE METABOLIC PANEL
ALBUMIN: 2.5 g/dL — AB (ref 3.5–5.0)
ALK PHOS: 98 U/L (ref 40–150)
AST: 10 U/L (ref 5–34)
Anion Gap: 11 mEq/L (ref 3–11)
BILIRUBIN TOTAL: 0.29 mg/dL (ref 0.20–1.20)
BUN: 8.8 mg/dL (ref 7.0–26.0)
CALCIUM: 9.1 mg/dL (ref 8.4–10.4)
CO2: 24 mEq/L (ref 22–29)
CREATININE: 0.8 mg/dL (ref 0.6–1.1)
Chloride: 100 mEq/L (ref 98–109)
EGFR: >60 mL/min/{1.73_m2} (ref >60)
Glucose: 137 mg/dl (ref 70–140)
Potassium: 3.6 mEq/L (ref 3.5–5.1)
Sodium: 135 mEq/L — ABNORMAL LOW (ref 136–145)
TOTAL PROTEIN: 6.3 g/dL — AB (ref 6.4–8.3)

## 2017-03-15 LAB — PREPARE RBC (CROSSMATCH)

## 2017-03-15 LAB — ABO/RH: ABO/RH(D): O POS

## 2017-03-15 MED ORDER — SODIUM CHLORIDE 0.9% FLUSH
10.0000 mL | INTRAVENOUS | Status: AC | PRN
  Administered 2017-03-15: 10 mL
  Filled 2017-03-15: qty 10

## 2017-03-15 MED ORDER — METHYLPREDNISOLONE SODIUM SUCC 125 MG IJ SOLR
125.0000 mg | Freq: Once | INTRAMUSCULAR | Status: AC
  Administered 2017-03-15: 125 mg via INTRAVENOUS

## 2017-03-15 MED ORDER — DIPHENHYDRAMINE HCL 25 MG PO CAPS
ORAL_CAPSULE | ORAL | Status: AC
  Filled 2017-03-15: qty 1

## 2017-03-15 MED ORDER — FAMOTIDINE IN NACL 20-0.9 MG/50ML-% IV SOLN
20.0000 mg | Freq: Two times a day (BID) | INTRAVENOUS | Status: DC
  Administered 2017-03-15: 20 mg via INTRAVENOUS

## 2017-03-15 MED ORDER — DIPHENHYDRAMINE HCL 50 MG/ML IJ SOLN
25.0000 mg | Freq: Once | INTRAMUSCULAR | Status: AC
  Administered 2017-03-15: 25 mg via INTRAVENOUS

## 2017-03-15 MED ORDER — ACETAMINOPHEN 325 MG PO TABS
ORAL_TABLET | ORAL | Status: AC
  Filled 2017-03-15: qty 2

## 2017-03-15 MED ORDER — SODIUM CHLORIDE 0.9% FLUSH
10.0000 mL | Freq: Once | INTRAVENOUS | Status: AC
  Administered 2017-03-15: 10 mL
  Filled 2017-03-15: qty 10

## 2017-03-15 MED ORDER — DIPHENHYDRAMINE HCL 25 MG PO CAPS
25.0000 mg | ORAL_CAPSULE | Freq: Once | ORAL | Status: AC
  Administered 2017-03-15: 25 mg via ORAL

## 2017-03-15 MED ORDER — SODIUM CHLORIDE 0.9 % IV SOLN
250.0000 mL | Freq: Once | INTRAVENOUS | Status: AC
  Administered 2017-03-15: 250 mL via INTRAVENOUS

## 2017-03-15 MED ORDER — ACETAMINOPHEN 325 MG PO TABS
650.0000 mg | ORAL_TABLET | Freq: Once | ORAL | Status: AC
  Administered 2017-03-15: 650 mg via ORAL

## 2017-03-15 MED ORDER — HEPARIN SOD (PORK) LOCK FLUSH 100 UNIT/ML IV SOLN
500.0000 [IU] | Freq: Every day | INTRAVENOUS | Status: AC | PRN
Start: 2017-03-15 — End: 2017-03-15
  Administered 2017-03-15: 500 [IU]
  Filled 2017-03-15: qty 5

## 2017-03-15 NOTE — Telephone Encounter (Signed)
Called radiology scheduler to schedule paracentesis. They said they have left 2 messages. Ask if they would call daughter Belenda Cruise to schedule. Radiology scheduling number given to family member at side in the infusion room.

## 2017-03-15 NOTE — Patient Instructions (Signed)

## 2017-03-15 NOTE — Progress Notes (Signed)
[facility] Progress Note   Audrey Garza [id number] 25-Mar-1941 76 y.o.  Audrey Garza is managed by Dr. Alvy Bimler  Actively treated with chemotherapy: yes  Current Therapy: Carboplatin and gemcitabine  Last Treated: 09 / 27 / 2018 (cycle 7 day 8)  Assessment: Plan:    Anemia in neoplastic disease  Please see After Visit Summary for patient specific instructions.  Future Appointments Date Time Provider Colp  03/16/2017 1:00 PM [facility] 3 [facility]  03/22/2017 9:30 AM [facility] LAB 2 [facility] None  03/22/2017 10:00 AM [facility] FLUSH NURSE 2 [facility] None  03/22/2017 10:30 AM Gorsuch, Ni, MD [facility] None  03/22/2017 11:15 AM [facility] B4 [facility] None    No orders of the defined types were placed in this encounter.      Subjective:   Patient ID:  Audrey Garza is a 76 y.o. (DOB 1940/08/29) female.  Chief Complaint: No chief complaint on file.   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 She is seen in the infusion room for a possible transfusion reaction. The patient was to receive 2 units of packed red blood cells. Approximately 5 minutes into her first transfusion she developed chattering teeth and chills. She had been premedicated with Benadryl 25 mg by mouth 1 and Tylenol 650 mg by mouth 1. Her transfusion was stopped. She was given Benadryl 25 mg IV, Pepcid 20 mg IV, and methylprednisolone 125 mg IV. Labs including blood and urine were collected for a possible transfusion reaction. The patient first unit of blood was sent back to the blood bank. The labs returned showing no transfusion reaction. The patient was given her second unit of blood. According to the patient's daughter the patient had been up to the bathroom and had developed chills but reports that this had happened at home on several occasions. The  patient's vital signs were stable with her blood pressure at 140/67, pulse 88, oxygen saturation 100% and temperature normal. The patient denied any back pain, chest tightness, shortness of breath, swelling of her throat, tongue, or mouth.  Medications: I have reviewed the patient's current medications.  Allergies: No Known Allergies  Past Medical History:  Diagnosis Date  . Cancer [facility])     Past Surgical History:  Procedure Laterality Date  . ABDOMINAL HYSTERECTOMY    . IR GENERIC HISTORICAL  05/24/2016   IR FLUORO GUIDE PORT INSERTION RIGHT 05/24/2016 Greggory Keen, MD [facility]  . IR GENERIC HISTORICAL  05/24/2016   IR US GUIDE VASC ACCESS RIGHT 05/24/2016 Greggory Keen, MD [facility]  . IR GENERIC HISTORICAL  06/08/2016   IR PERC TUN PERIT CATH WO PORT S&I Dartha Lodge 06/08/2016 Sandi Mariscal, MD [facility]  . IR PARACENTESIS  02/14/2017  . IR REMOVAL PERM PERITONEAL CATH  09/20/2016    No family history on file.  Social History   Social History  . Marital status: Widowed    Spouse name: N/A  . Number of children: N/A  . Years of education: N/A   Occupational History  . Not on file.   Social History Main Topics  . Smoking status: Never Smoker  . Smokeless tobacco: Never Used  . Alcohol use No  . Drug use: Unknown  . Sexual activity: Not on file   Other Topics Concern  . Not on file   Social History Narrative  . No narrative on file    Past Medical  History, Surgical history, Social history, and Family history were reviewed and updated as appropriate.   Please see review of systems for further details on the patient's review from today.   Review of Systems:  Review of Systems  Constitutional: Positive for chills. Negative for diaphoresis and fever.  HENT: Negative for trouble swallowing.        Chattering teeth  Respiratory: Negative for cough, choking and shortness of breath.   Cardiovascular: Negative for chest pain and palpitations.    Objective:    Physical Exam:  There were no vitals taken for this visit.  Physical Exam  Constitutional: No distress.  HENT:  Head: Normocephalic and atraumatic.  Cardiovascular: Normal rate, regular rhythm and normal heart sounds.  Exam reveals no gallop and no friction rub.   No murmur heard. Pulmonary/Chest: Effort normal and breath sounds normal. No respiratory distress. She has no wheezes. She has no rales.  Musculoskeletal: She exhibits no edema.  Neurological: She is alert.  Skin: Skin is warm and dry. No rash noted. She is not diaphoretic. No erythema.    Lab Review:     Component Value Date/Time   NA 135 (L) 03/15/2017 0912   K 3.6 03/15/2017 0912   CL 98 (L) 02/13/2017 1245   CO2 24 03/15/2017 0912   GLUCOSE 137 03/15/2017 0912   BUN 8.8 03/15/2017 0912   CREATININE 0.8 03/15/2017 0912   CALCIUM 9.1 03/15/2017 0912   PROT 6.3 (L) 03/15/2017 0912   ALBUMIN 2.5 (L) 03/15/2017 0912   AST 10 03/15/2017 0912   ALT <6 03/15/2017 0912   ALKPHOS 98 03/15/2017 0912   BILITOT 0.29 03/15/2017 0912   GFRNONAA >60 02/13/2017 1245   GFRAA >60 02/13/2017 1245       Component Value Date/Time   WBC 3.0 (L) 03/15/2017 0912   WBC 9.3 02/13/2017 1245   RBC 2.74 (L) 03/15/2017 0912   RBC 3.48 (L) 02/13/2017 1245   HGB 7.6 (L) 03/15/2017 0912   HCT 24.3 (L) 03/15/2017 0912   PLT 433 (H) 03/15/2017 0912   MCV 88.7 03/15/2017 0912   MCH 27.7 03/15/2017 0912   MCH 29.9 02/13/2017 1245   MCHC 31.3 (L) 03/15/2017 0912   MCHC 32.5 02/13/2017 1245   RDW 18.9 (H) 03/15/2017 0912   LYMPHSABS 1.1 03/15/2017 0912   MONOABS 1.2 (H) 03/15/2017 0912   EOSABS 0.0 03/15/2017 0912   BASOSABS 0.0 03/15/2017 0912   -------------------------------  Imaging from last 24 hours (if applicable):  Radiology interpretation: US Paracentesis  Result Date: 03/02/2017 INDICATION: History of ovarian cancer with recurrent malignant ascites. Request made for therapeutic paracentesis up to 5 liters. EXAM:  ULTRASOUND GUIDED THERAPEUTIC PARACENTESIS MEDICATIONS: None. COMPLICATIONS: None immediate. PROCEDURE: Informed written consent was obtained from the patient after a discussion of the risks, benefits and alternatives to treatment. A timeout was performed prior to the initiation of the procedure. Initial ultrasound scanning demonstrates a moderate amount of ascites within the right mid to lower abdominal quadrant. The right mid to lower abdomen was prepped and draped in the usual sterile fashion. 2% lidocaine was used for local anesthesia. Following this, a Yueh catheter was introduced. An ultrasound image was saved for documentation purposes. The paracentesis was performed. The catheter was removed and a dressing was applied. The patient tolerated the procedure well without immediate post procedural complication. FINDINGS: A total of approximately 3.4 liters of bloody fluid was removed. IMPRESSION: Successful ultrasound-guided therapeutic paracentesis yielding 3.4 liters of peritoneal fluid. Read by: Lennette Bihari  Allred, PA-C Electronically Signed   By: Corrie Mckusick D.O.   On: 03/02/2017 13:00   US Paracentesis  Result Date: 02/20/2017 INDICATION: History of ovarian cancer with recurrent abdominal ascites. Request is made for therapeutic paracentesis. EXAM: ULTRASOUND GUIDED THERAPEUTIC PARACENTESIS MEDICATIONS: 2% xylocaine COMPLICATIONS: None immediate. PROCEDURE: Informed written consent was obtained from the patient after a discussion of the risks, benefits and alternatives to treatment. A timeout was performed prior to the initiation of the procedure. Initial ultrasound scanning demonstrates a small amount of ascites within the right upper abdominal quadrant. The right upper abdomen was prepped and draped in the usual sterile fashion. 2% xylocaine was used for local anesthesia. Following this, a 19 gauge, 7-cm, Yueh catheter was introduced. An ultrasound image was saved for documentation purposes. The  paracentesis was performed. The catheter was removed and a dressing was applied. The patient tolerated the procedure well without immediate post procedural complication. FINDINGS: A total of approximately 2.7 L of bloody fluid was removed. IMPRESSION: Successful ultrasound-guided paracentesis yielding 2.7 liters of peritoneal fluid. Read by: Saverio Danker, PA-C Electronically Signed   By: Aletta Edouard M.D.   On: 02/20/2017 14:26   Ir Paracentesis  Result Date: 02/14/2017 INDICATION: History of ovarian cancer, recurrent ascites. Request is made for therapeutic paracentesis. EXAM: ULTRASOUND GUIDED THERAPEUTIC PARACENTESIS MEDICATIONS: 10 mL 1% lidocaine COMPLICATIONS: None immediate. PROCEDURE: Informed written consent was obtained from the patient after a discussion of the risks, benefits and alternatives to treatment. A timeout was performed prior to the initiation of the procedure. Initial ultrasound scanning demonstrates a large amount of ascites within the right lateral abdomen. The right lateral abdomen was prepped and draped in the usual sterile fashion. 1% lidocaine was used for local anesthesia. Following this, a 19 gauge, 7-cm, Yueh catheter was introduced, but was unable to pass through the peritoneal lining due to tenting. A 19 gauge, 10-cm, Yueh was then introduced. An ultrasound image was saved for documentation purposes. The paracentesis was performed. The catheter was removed and a dressing was applied. The patient tolerated the procedure well without immediate post procedural complication. FINDINGS: A total of approximately 2.6 liters of bloody fluid was removed. IMPRESSION: Successful ultrasound-guided therapeutic paracentesis yielding 2.6 liters of peritoneal fluid. Read by:  Brynda Greathouse PA-C Electronically Signed   By: Aletta Edouard M.D.   On: 02/14/2017 10:46        This case was discussed with Dr. Alvy Bimler. She expresses agreement with my management of this patient.

## 2017-03-15 NOTE — Progress Notes (Signed)
1157- Pt given 650 tylenol and 25 benadryl PO as premeds for first unit of blood.  No hx of previous reactions per daughter.  Food and drinks offered and taken by patient.  1240- Blood started.  VS stable.  1248- Pt begins having severe chills & teeth chattering.  HR 100, BP 140/67, temp 98.5, 94% on RA.  Blood stopped.  Pt's daughter translating at chairside, reports pt feeling cold.  Denies CP or SOB (voice clear), denies back pain or abd pain, denies itching or any other symptoms.  PA Sandi Mealy notified & primary MD Alvy Bimler notified.  Rankin at bedside. Verbal orders for emergency meds given.  New line & bag of NS 250 bolus hung.  25 mg benadryl given IV push.  2L [place] oxygen given.  Pt reports feeling much better, rigors stopped.  Denies any pain or other symptoms.  1252- 125 mg solumedrol given IV push.  1253- 20 mg pepcid IVPB bolus given.  Vitals 87 bpm, 20 RR, 100 % on 2L [place].  1255- 100% 2L [place], 87 bpm, 98.4, 134/61  1310- 98% RA, 86 bpm, 18 RR, 124/61  1327- Labs drawn & blood product return form filled out, sent to main [facility] lab/blood bank.  Vitals 118/60, 88 bpm, 97% RA, 18 RR, 98.4  1345- Vitals stable.  A&Ox4.  Denies any further symptoms.  Resting at this time.  Holding second unit of blood until urine sample is available for lab analysis.  1440- Pt reports being cold again, slight shivers.  PA Lucianne Lei notified.  Pt's daughter states that her mother often gets extremely cold.  Gilbert assessed pt at chairside and wants to proceed with second unit of blood.  (Urine sent to lab at 1435.)  Will monitor closely at chairside.  1505- Second bag of blood started.  VS stable.  A&OX4.  5638- Pt reports no symptoms and VS are stable : 120/69, 98.5, 18 RR, 90 bpm, 97% RA.  PA Lucianne Lei aware.  Will continue with administration until finished.  Food and drink continually offered.

## 2017-03-15 NOTE — Progress Notes (Signed)
Kept pt 30 min post blood transfusion to monitor for s/s of reaction.  Pt remained asymptomatic and was discharged home.

## 2017-03-16 ENCOUNTER — Encounter: Payer: Self-pay | Admitting: Hematology and Oncology

## 2017-03-16 ENCOUNTER — Ambulatory Visit (HOSPITAL_COMMUNITY)
Admission: RE | Admit: 2017-03-16 | Discharge: 2017-03-16 | Disposition: A | Discharge status: Home / Self Care | Payer: Self-pay | Source: Ambulatory Visit | Attending: Hematology and Oncology | Admitting: Hematology and Oncology

## 2017-03-16 DIAGNOSIS — C801 Malignant (primary) neoplasm, unspecified: Secondary | ICD-10-CM

## 2017-03-16 DIAGNOSIS — C786 Secondary malignant neoplasm of retroperitoneum and peritoneum: Secondary | ICD-10-CM

## 2017-03-16 DIAGNOSIS — R180 Malignant ascites: Secondary | ICD-10-CM | POA: Insufficient documentation

## 2017-03-16 HISTORY — PX: IR PARACENTESIS: IMG2679

## 2017-03-16 LAB — BPAM RBC
BLOOD PRODUCT EXPIRATION DATE: 201811032359
BLOOD PRODUCT EXPIRATION DATE: 201811032359
ISSUE DATE / TIME: 201810111218
ISSUE DATE / TIME: 201810111218
UNIT TYPE AND RH: 5100
Unit Type and Rh: 5100

## 2017-03-16 LAB — TRANSFUSION REACTION
DAT C3: NEGATIVE
POST RXN DAT IGG: NEGATIVE

## 2017-03-16 LAB — TYPE AND SCREEN
ABO/RH(D): O POS
ANTIBODY SCREEN: NEGATIVE
Unit division: 0
Unit division: 0

## 2017-03-16 LAB — CA 125: Cancer Antigen (CA) 125: 50.6 U/mL — ABNORMAL HIGH (ref 0.0–38.1)

## 2017-03-16 MED ORDER — LIDOCAINE 2% (20 MG/ML) 5 ML SYRINGE
INTRAMUSCULAR | Status: AC
  Filled 2017-03-16: qty 10

## 2017-03-16 NOTE — Procedures (Signed)
PROCEDURE SUMMARY:  Successful US guided paracentesis from right lateral abdomen.  Yielded 3.0 liters of bloody fluid.  No immediate complications.  Pt tolerated well.   Specimen was not sent for labs.  Docia Barrier PA-C 03/16/2017 1:59 PM

## 2017-03-16 NOTE — Assessment & Plan Note (Signed)
Overall, she tolerated treatment poorly with progressive pancytopenia I will hold treatment and reschedule I plan to add G-CSF support in the future I would proceed with blood transfusion due to symptomatic anemia I would arrange for repeat paracentesis for recurrent ascites

## 2017-03-16 NOTE — Progress Notes (Signed)
[place] OFFICE PROGRESS NOTE  Patient Care Team: Default, Provider, MD as PCP - General  SUMMARY OF ONCOLOGIC HISTORY:   Ovarian carcinoma ([place])   04/19/2015 Initial Diagnosis    In late 03-2015 she in [place] and was found to have large pelvic mass and perihepatic ascites. CA 125 then was 359, with CEA of 3, CA 19-9 5.8 and alpha fetoprotein 2.3. She had exploratory laparotomy 04-19-15, not done by gyn oncologist, with bilateral ovaried an tumors, 10 cm with capsule ruptured on right , smaller on left, peritoneal carcinomatosis.       05/20/2015 Miscellaneous    She had some bowel obstruction post operatively, with NG used . Pathology reportedcystic and solid left ovarian mass, 13 cm right ovarian mass, moderately differentiated tumor "tumor de celulas de la granulosa". 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 CT AP 05-2015 had ascites, carcinomatosis and multiple peritoneal implants. CA 125 on 05-24-15 baseline for chemo was 141. She received 3 cycles of taxol + carboplatin from 05-26-15 thru 07-07-15 (not clear if avastin given those cycles), after which carboplatin was not available in [place]. She received taxol 175 mg/m2, CDDP and avastin for 3 additional cycles thru 10-07-15. Second look laparotomy was discussed, patient declined, and instead had 4 cycles avastin + taxol from 12-01-15 thru 02-02-16 (apparentlyno CDDP after 10-07-15 and no carboplatin after 07-07-15). CT CAP 02-24-16 reportedly had no evidence of malignancy, with some bullous emphysema, tortuous sigmoid colon with diverticulosis. . Recommendation was for maintenance avastin x 1 year, however patient then came to [place] to be with family.       05/20/2016 Imaging    Extensive peritoneal disease with omental thickening/ 18 and large volume ascites. 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 Trace bilateral  pleural effusions. Moderate hiatal hernia. Bilateral inguinal hernias containing fat. Left colonic diverticulosis. Aortoiliac atherosclerosis.      05/20/2016 - 06/13/2016 Hospital Admission    76 year old female with a history of metastatic ovarian cancer diagnosed in January 2017 (s/p primary debulking surgery followed by 8 adjuvant cycles of carboplatin and paclitaxel and 4 additional cycles of carboplatin, paclitaxel, avastin), last chemo Sept 2017, presented with progressive abdominal bloating and pain for 3 weeks duration. She was feeling well until the first week of December, 2017 when she was sick with a virus from which she never recovered. She states that she began feeling distended in the abdomen and uncomfortable and was taken to the ED on 05/20/16. CT chest/abd/pelvis9-21-17 reportedly with no evidence of malignancy, with some bullous emphysema, tortuous sigmoid colon with diverticulosis. Recommendation was for maintenance avastin x 1 year, however patient then came to [facility] in 02-2016. She has not established care with any physician since coming to [place]. Unfortunately, after paracentesis she began having increasing abdominal pain. Abdominal x-ray revealed distended small bowel loops concerning for small bowel obstruction. Gen. surgery was consulted. NG was inserted to suction. Because of prolonged Hospitalization she was placed on TPN/TNA. She underwent Chemotheray with Doxil on 12/26. Repeat U/S 12/31 showed interval progression of ascites and She is s/p repeat Paracentesis 06-06-16 which removed 4 liters. She was subsequently discharged      05/21/2016 Tumor Marker    Patient's tumor was tested for the following markers: CA125 Results of the tumor marker test revealed 249.8      05/22/2016 Procedure    Successful ultrasound-guided paracentesis yielding 2.9 liters of peritoneal fluid.  05/22/2016 Pathology Results    PERITONEAL/ASCITIC FLUID, 1 OF 1 COLLECTED ON 05/22/16:  ADENOCARCINOMA. ADDENDUM: Immunohistochemistry is performed on the cell block and the tumor is strongly positive with cytokeratin 7 and cytokeratin 8/18 and shows patchy positivity with estrogen receptor, cytokeratin 5/6, WT-1 and MOC-31 with focal weak positivity with PAX-8. The tumor is negative with thyroglobulin, thyroid transcription factor-1, Napsin-A, progesterone receptor, GATA 3, gross cystic disease fluid protein, cytokeratin 20, CDX-2 and Calretinin. 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       05/24/2016 Procedure    Ultrasound and fluoroscopically guided right internal jugular single lumen power port catheter insertion. Tip in the SVC/RA junction. Catheter ready for use      05/24/2016 Imaging    LV EF: 65% -   70%      05/28/2016 Imaging    Small volume of abdominal and pelvic ascites is identified and inadequate for paracentesis.      05/30/2016 - 08/21/2016 Chemotherapy    She received doxorubicin x 3 cycles, stopped due to disease progression      06/04/2016 Imaging    Increased amount of ascites, freely distributed in all 4 quadrants. No other acute finding by ultrasound.      06/06/2016 Imaging    Small left greater than right pleural effusions, increased compared to prior CT. Interim development of partial consolidations within the bilateral lower lobes which may reflect atelectasis or pneumonia. Few scattered foci of ground-glass density within the right upper and left upper lobes, suspect small foci of inflammation or infection. 2. Moderate to large volume of ascites in the upper abdomen with extensive mesenteric and omental metastatic disease partially visualized. 3. Dilated gallbladder.      06/08/2016 Procedure    Successful ultrasound and fluoroscopic guided placement of a tunneled peritoneal drainage catheter. 2.  Successful aspiration of 900 cc of serous ascites following tunneled peritoneal drainage catheter placement.      06/19/2016 Tumor Marker    Patient's tumor was tested for the following markers: CA125 Results of the tumor marker test revealed 166.6      07/24/2016 Tumor Marker    Patient's tumor was tested for the following markers: CA125 Results of the tumor marker test revealed 104.4      08/18/2016 Imaging    Extensive peritoneal metastatic disease within the abdomen. This is somewhat difficult to compare with prior given diffuse nature however there is suggestion of interval progression. Interval resolution of previously described bilateral pleural effusions. Peritoneal drainage catheter is present with tip terminating in the right upper quadrant. Interval decrease in now small volume ascites. Aortic atherosclerosis. Diverticulosis without CT evidence for acute diverticulitis.      08/18/2016 Imaging    LV EF: 60% -  65%      08/21/2016 Tumor Marker    Patient's tumor was tested for the following markers: CA125 Results of the tumor marker test revealed 96.1      08/29/2016 - 12/26/2016 Chemotherapy    She received gemzar and carboplatin      09/05/2016 Tumor Marker    Patient's tumor was tested for the following markers: CA125 Results of the tumor marker test revealed 48.1      09/20/2016 Procedure    Status post bedside removal of tunneled right-sided peritoneal drain which was removed in its entirety.      10/03/2016 Tumor Marker    Patient's tumor  was tested for the following markers: CA125 Results of the tumor marker test revealed 22.9      11/03/2016 Imaging    Interval decrease in peritoneal metastatic disease when compared with prior exam. Aortic atherosclerosis. Sigmoid colonic diverticulosis.      11/07/2016 Tumor Marker    Patient's tumor was tested for the following markers: CA125 Results of the tumor marker test revealed 9.6      12/05/2016 Tumor Marker     Patient's tumor was tested for the following markers: CA125 Results of the tumor marker test revealed 9.6      12/19/2016 Tumor Marker    Patient's tumor was tested for the following markers: CA-125 Results of the tumor marker test revealed 8.      01/08/2017 Imaging    1. Disease described previously in the central small bowel mesentery is now noted to be omental. Omental positioning is quite different between the 2 studies making reproducible measurement impossible, but the confluent soft tissue attenuation and bulkiness of this disease appears qualitatively decreased in the interval. Despite the improvement in the central omental disease, there is a new 1.6 cm left omental nodule towards the splenic flexure which cannot be identified on the previous study, concerning for new area of focal progression. 2. Trace free fluid in the pelvis. 3. Small groin hernias contain only fat. 4. Left colonic diverticulosis without diverticulitis. 5. Aortic Atherosclerois (ICD10-170.0)      02/07/2017 Tumor Marker    Patient's tumor was tested for the following markers: CA125 Results of the tumor marker test revealed 98.6      02/07/2017 Imaging    1. Normal bowel gas pattern. 2. Stool distends the ascending colon. No generalized stool retention or rectal impaction      02/13/2017 Imaging    Ct chest, abdomen and pelvis: 1. There has been a mixed interval response to therapy. 2. Large central abdominal mass demonstrates significant decrease in volume from previous exam. 3. Enlarging peritoneal lesion within the right posterior pelvis. A new lesion is identified along the right pericolic gutter. 4. Similar volume of ascites within the abdomen or pelvis      02/14/2017 Procedure    Successful ultrasound-guided therapeutic paracentesis yielding 2.6 liters of peritoneal fluid      02/20/2017 Procedure    Successful ultrasound-guided paracentesis yielding 2.7 liters of peritoneal fluid      02/22/2017  -  Chemotherapy    The patient resumed treatment with Carboplatin and Gemzar      03/02/2017 Procedure    Successful ultrasound-guided therapeutic paracentesis yielding 3.4 liters of peritoneal fluid      03/15/2017 Tumor Marker    Patient's tumor was tested for the following markers: CA125 Results of the tumor marker test revealed 50.6       INTERVAL HISTORY: Please see below for problem oriented charting. She is seen prior to treatment She continues to have abdominal pain, bloating and significant abdominal distention She have some nausea but did not take antiemetics as instructed She denies constipation She denies chest pain or breath No recent infection The patient denies any recent signs or symptoms of bleeding such as spontaneous epistaxis, hematuria or hematochezia.  REVIEW OF SYSTEMS:   Constitutional: Denies fevers, chills or abnormal weight loss Eyes: Denies blurriness of vision Ears, nose, mouth, throat, and face: Denies mucositis or sore throat Respiratory: Denies cough, dyspnea or wheezes Cardiovascular: Denies palpitation, chest discomfort or lower extremity swelling Skin: Denies abnormal skin rashes Lymphatics: Denies new lymphadenopathy or  easy bruising Neurological:Denies numbness, tingling or new weaknesses Behavioral/Psych: Mood is stable, no new changes  All other systems were reviewed with the patient and are negative.  I have reviewed the past medical history, past surgical history, social history and family history with the patient and they are unchanged from previous note.  ALLERGIES:  has No Known Allergies.  MEDICATIONS:  Current Outpatient Prescriptions  Medication Sig Dispense Refill  . lactulose (CHRONULAC) 10 GM/15ML solution Take 15 mLs (10 g total) by mouth 3 (three) times daily. 240 mL 3  . lidocaine-prilocaine (EMLA) cream Apply to port 1 hour before access with needle. Cover with plastic wrap. (Patient not taking: Reported on 11/07/2016) 30  g 11  . mirtazapine (REMERON) 15 MG tablet Take 1 tablet (15 mg total) by mouth at bedtime. 30 tablet 9  . ondansetron (ZOFRAN ODT) 8 MG disintegrating tablet Take 1 tablet (8 mg total) by mouth every 8 (eight) hours as needed for nausea or vomiting. (Patient not taking: Reported on 11/07/2016) 30 tablet 0  . oxyCODONE 10 MG TABS Take 1 tablet (10 mg total) by mouth every 4 (four) hours as needed for severe pain. 60 tablet 0  . promethazine (PHENERGAN) 25 MG tablet Take 1 tablet (25 mg total) by mouth every 6 (six) hours as needed for nausea or vomiting. (Patient not taking: Reported on 11/07/2016) 30 tablet 9   No current facility-administered medications for this visit.     PHYSICAL EXAMINATION: ECOG PERFORMANCE STATUS: 2 - Symptomatic, <50% confined to bed  Vitals:   03/15/17 0942  BP: 116/63  Pulse: (!) 109  Resp: 20  Temp: 98.5 F (36.9 C)  SpO2: 97%   Filed Weights   03/15/17 0942  Weight: 163 lb 11.2 oz (74.3 kg)    GENERAL:alert, no distress and comfortable SKIN: skin color, texture, turgor are normal, no rashes or significant lesions EYES: normal, Conjunctiva are pale and non-injected, sclera clear OROPHARYNX:no exudate, no erythema and lips, buccal mucosa, and tongue normal  NECK: supple, thyroid normal size, non-tender, without nodularity LYMPH:  no palpable lymphadenopathy in the cervical, axillary or inguinal LUNGS: clear to auscultation and percussion with normal breathing effort HEART: regular rate & rhythm and no murmurs and no lower extremity edema ABDOMEN:abdomen soft, non-tender and normal bowel sounds.  Abdomen is distended Musculoskeletal:no cyanosis of digits and no clubbing  NEURO: alert & oriented x 3 with fluent speech, no focal motor/sensory deficits  LABORATORY DATA:  I have reviewed the data as listed    Component Value Date/Time   NA 135 (L) 03/15/2017 0912   K 3.6 03/15/2017 0912   CL 98 (L) 02/13/2017 1245   CO2 24 03/15/2017 0912   GLUCOSE 137  03/15/2017 0912   BUN 8.8 03/15/2017 0912   CREATININE 0.8 03/15/2017 0912   CALCIUM 9.1 03/15/2017 0912   PROT 6.3 (L) 03/15/2017 0912   ALBUMIN 2.5 (L) 03/15/2017 0912   AST 10 03/15/2017 0912   ALT <6 03/15/2017 0912   ALKPHOS 98 03/15/2017 0912   BILITOT 0.29 03/15/2017 0912   GFRNONAA >60 02/13/2017 1245   GFRAA >60 02/13/2017 1245    No results found for: SPEP, UPEP  Lab Results  Component Value Date   WBC 3.0 (L) 03/15/2017   NEUTROABS 0.7 (L) 03/15/2017   HGB 7.6 (L) 03/15/2017   HCT 24.3 (L) 03/15/2017   MCV 88.7 03/15/2017   PLT 433 (H) 03/15/2017      Chemistry      Component Value  Date/Time   NA 135 (L) 03/15/2017 0912   K 3.6 03/15/2017 0912   CL 98 (L) 02/13/2017 1245   CO2 24 03/15/2017 0912   BUN 8.8 03/15/2017 0912   CREATININE 0.8 03/15/2017 0912      Component Value Date/Time   CALCIUM 9.1 03/15/2017 0912   ALKPHOS 98 03/15/2017 0912   AST 10 03/15/2017 0912   ALT <6 03/15/2017 0912   BILITOT 0.29 03/15/2017 0912       RADIOGRAPHIC STUDIES: I have personally reviewed the radiological images as listed and agreed with the findings in the report. US Paracentesis  Result Date: 03/02/2017 INDICATION: History of ovarian cancer with recurrent malignant ascites. Request made for therapeutic paracentesis up to 5 liters. EXAM: ULTRASOUND GUIDED THERAPEUTIC PARACENTESIS MEDICATIONS: None. COMPLICATIONS: None immediate. PROCEDURE: Informed written consent was obtained from the patient after a discussion of the risks, benefits and alternatives to treatment. A timeout was performed prior to the initiation of the procedure. Initial ultrasound scanning demonstrates a moderate amount of ascites within the right mid to lower abdominal quadrant. The right mid to lower abdomen was prepped and draped in the usual sterile fashion. 2% lidocaine was used for local anesthesia. Following this, a Yueh catheter was introduced. An ultrasound image was saved for documentation  purposes. The paracentesis was performed. The catheter was removed and a dressing was applied. The patient tolerated the procedure well without immediate post procedural complication. FINDINGS: A total of approximately 3.4 liters of bloody fluid was removed. IMPRESSION: Successful ultrasound-guided therapeutic paracentesis yielding 3.4 liters of peritoneal fluid. Read by: Rowe Robert, PA-C Electronically Signed   By: Corrie Mckusick D.O.   On: 03/02/2017 13:00   US Paracentesis  Result Date: 02/20/2017 INDICATION: History of ovarian cancer with recurrent abdominal ascites. Request is made for therapeutic paracentesis. EXAM: ULTRASOUND GUIDED THERAPEUTIC PARACENTESIS MEDICATIONS: 2% xylocaine COMPLICATIONS: None immediate. PROCEDURE: Informed written consent was obtained from the patient after a discussion of the risks, benefits and alternatives to treatment. A timeout was performed prior to the initiation of the procedure. Initial ultrasound scanning demonstrates a small amount of ascites within the right upper abdominal quadrant. The right upper abdomen was prepped and draped in the usual sterile fashion. 2% xylocaine was used for local anesthesia. Following this, a 19 gauge, 7-cm, Yueh catheter was introduced. An ultrasound image was saved for documentation purposes. The paracentesis was performed. The catheter was removed and a dressing was applied. The patient tolerated the procedure well without immediate post procedural complication. FINDINGS: A total of approximately 2.7 L of bloody fluid was removed. IMPRESSION: Successful ultrasound-guided paracentesis yielding 2.7 liters of peritoneal fluid. Read by: Saverio Danker, PA-C Electronically Signed   By: Aletta Edouard M.D.   On: 02/20/2017 14:26   Ir Paracentesis  Result Date: 02/14/2017 INDICATION: History of ovarian cancer, recurrent ascites. Request is made for therapeutic paracentesis. EXAM: ULTRASOUND GUIDED THERAPEUTIC PARACENTESIS MEDICATIONS: 10  mL 1% lidocaine COMPLICATIONS: None immediate. PROCEDURE: Informed written consent was obtained from the patient after a discussion of the risks, benefits and alternatives to treatment. A timeout was performed prior to the initiation of the procedure. Initial ultrasound scanning demonstrates a large amount of ascites within the right lateral abdomen. The right lateral abdomen was prepped and draped in the usual sterile fashion. 1% lidocaine was used for local anesthesia. Following this, a 19 gauge, 7-cm, Yueh catheter was introduced, but was unable to pass through the peritoneal lining due to tenting. A 19 gauge, 10-cm, Teressa Lower was then  introduced. An ultrasound image was saved for documentation purposes. The paracentesis was performed. The catheter was removed and a dressing was applied. The patient tolerated the procedure well without immediate post procedural complication. FINDINGS: A total of approximately 2.6 liters of bloody fluid was removed. IMPRESSION: Successful ultrasound-guided therapeutic paracentesis yielding 2.6 liters of peritoneal fluid. Read by:  Brynda Greathouse PA-C Electronically Signed   By: Aletta Edouard M.D.   On: 02/14/2017 10:46    ASSESSMENT & PLAN:  Ovarian carcinoma (HCC) Overall, she tolerated treatment poorly with progressive pancytopenia I will hold treatment and reschedule I plan to add G-CSF support in the future I would proceed with blood transfusion due to symptomatic anemia I would arrange for repeat paracentesis for recurrent ascites  Pancytopenia, acquired ([place]) She have severe, progressive pancytopenia I recommend G-CSF support in the future We will delay treatment I will modified dose of treatment Due to symptomatic anemia, we will proceed with blood transfusion We discussed some of the risks, benefits, and alternatives of blood transfusions. The patient is symptomatic from anemia and the hemoglobin level is critically low.  Some of the side-effects to be  expected including risks of transfusion reactions, chills, infection, syndrome of volume overload and risk of hospitalization from various reasons and the patient is willing to proceed and went ahead to sign consent today.   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 Hold Tube, Blood Bank    Standing Status:   Standing    Number of Occurrences:   9    Standing Expiration Date:   03/15/2018   All questions were answered. The patient knows to call the clinic with any problems, questions or concerns. No barriers to learning was detected. I spent 30 minutes counseling the patient face to face. The total time spent in the appointment was 40 minutes and more than 50% was on counseling and review of test results     Heath Lark, MD 03/16/2017 10:49 AM

## 2017-03-16 NOTE — Assessment & Plan Note (Signed)
She has recurrent ascites We will arrange for repeat paracentesis

## 2017-03-16 NOTE — Assessment & Plan Note (Signed)
She have severe, progressive pancytopenia I recommend G-CSF support in the future We will delay treatment I will modified dose of treatment Due to symptomatic anemia, we will proceed with blood transfusion We discussed some of the risks, benefits, and alternatives of blood transfusions. The patient is symptomatic from anemia and the hemoglobin level is critically low.  Some of the side-effects to be expected including risks of transfusion reactions, chills, infection, syndrome of volume overload and risk of hospitalization from various reasons and the patient is willing to proceed and went ahead to sign consent today.

## 2017-03-16 NOTE — Assessment & Plan Note (Signed)
She is malnourished and have malignant cachexia I plan to order mirtazapine I will reassess this next visit

## 2017-03-16 NOTE — Assessment & Plan Note (Signed)
She continues to have intermittent abdominal pain from her disease We discussed narcotic refill policy and a refill her prescription

## 2017-03-20 ENCOUNTER — Telehealth: Payer: Self-pay | Admitting: Hematology and Oncology

## 2017-03-20 NOTE — Telephone Encounter (Signed)
Faxed records to [facility] [phone]

## 2017-03-22 ENCOUNTER — Ambulatory Visit: Payer: Self-pay

## 2017-03-22 ENCOUNTER — Ambulatory Visit: Payer: Self-pay | Admitting: Hematology and Oncology

## 2017-03-22 ENCOUNTER — Other Ambulatory Visit: Payer: Self-pay

## 2017-12-22 IMAGING — DX DG CHEST 1V PORT SAME DAY
1 series · 1 of 1 positions shown · non-contrast
Comparison: None.

CLINICAL DATA: Shortness of Breath

EXAM:
PORTABLE CHEST 1 VIEW

[chest ap]
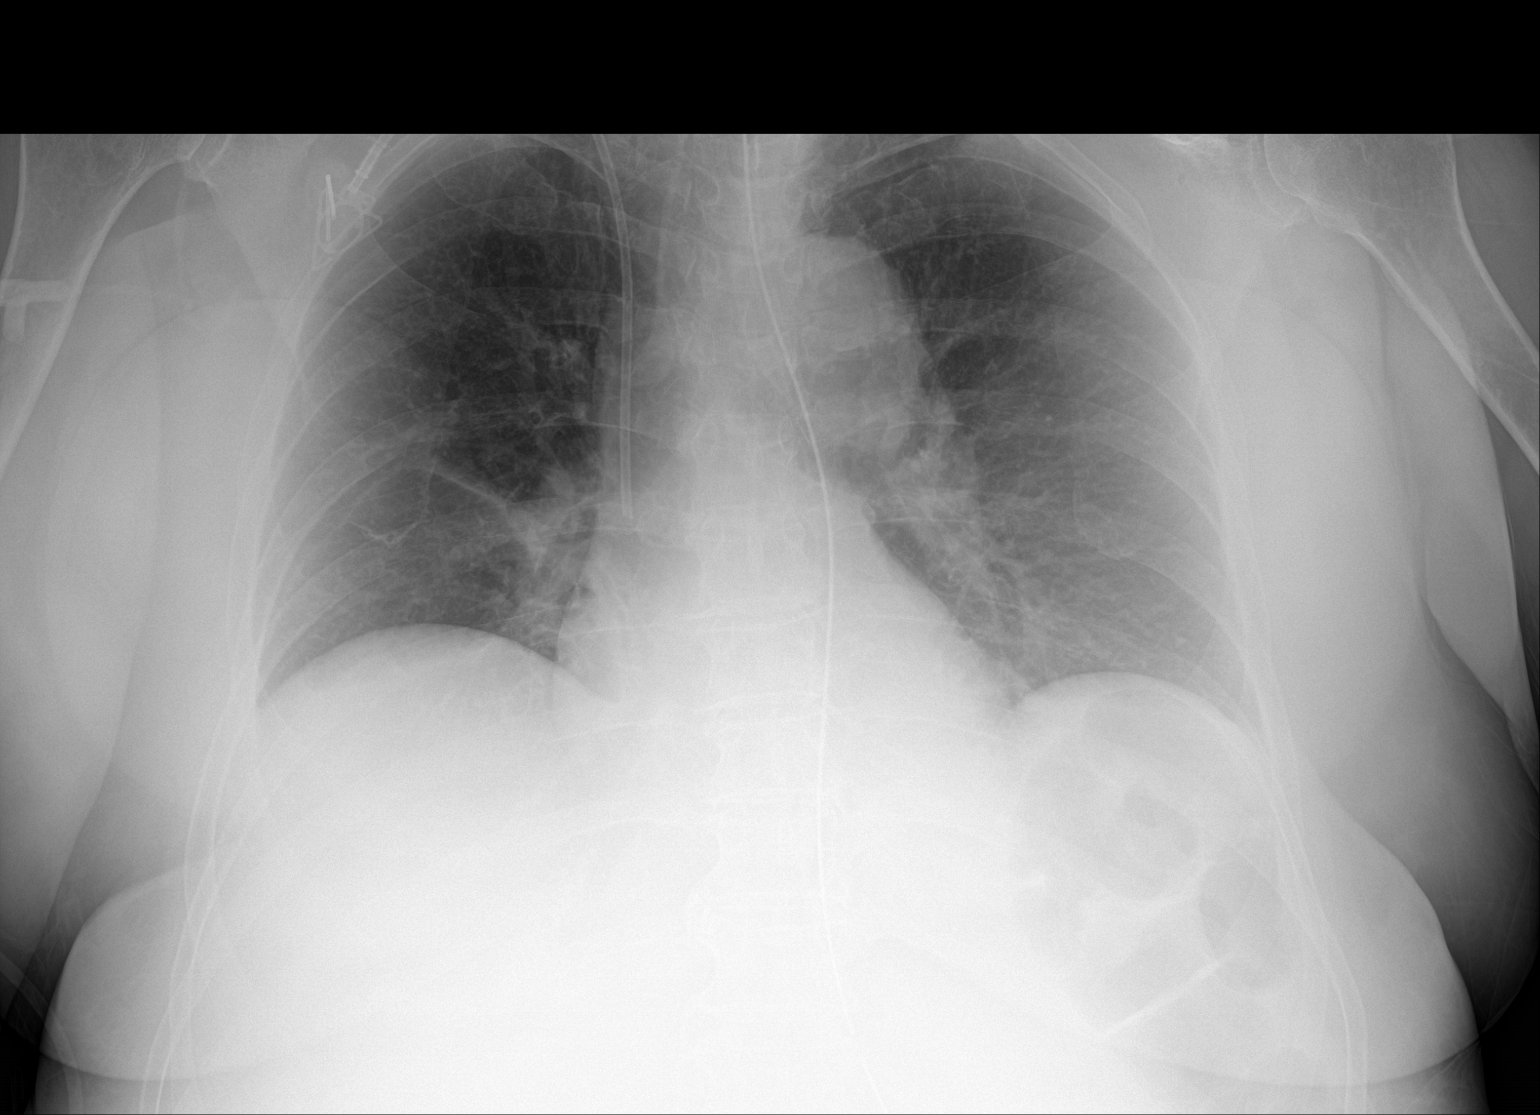

[1 of 1 positions shown; findings below may reference images not displayed]

FINDINGS: Right Port-A-Cath tip is in the lower SVC. NG tube enters the
stomach. Heart is normal size. Low lung volumes. Right perihilar and
left base atelectasis. No effusions. No acute bony abnormality.
IMPRESSION: Areas of atelectasis bilaterally.  Low lung volumes.

## 2017-12-22 IMAGING — DX DG ABD PORTABLE 1V
2 series · 2 of 2 positions shown · non-contrast
Comparison: Abdominal radiograph 05/31/2016.

CLINICAL DATA: Patient with shortness of breath.

EXAM:
PORTABLE ABDOMEN - 1 VIEW

[abdomen kub (1 of 2)]
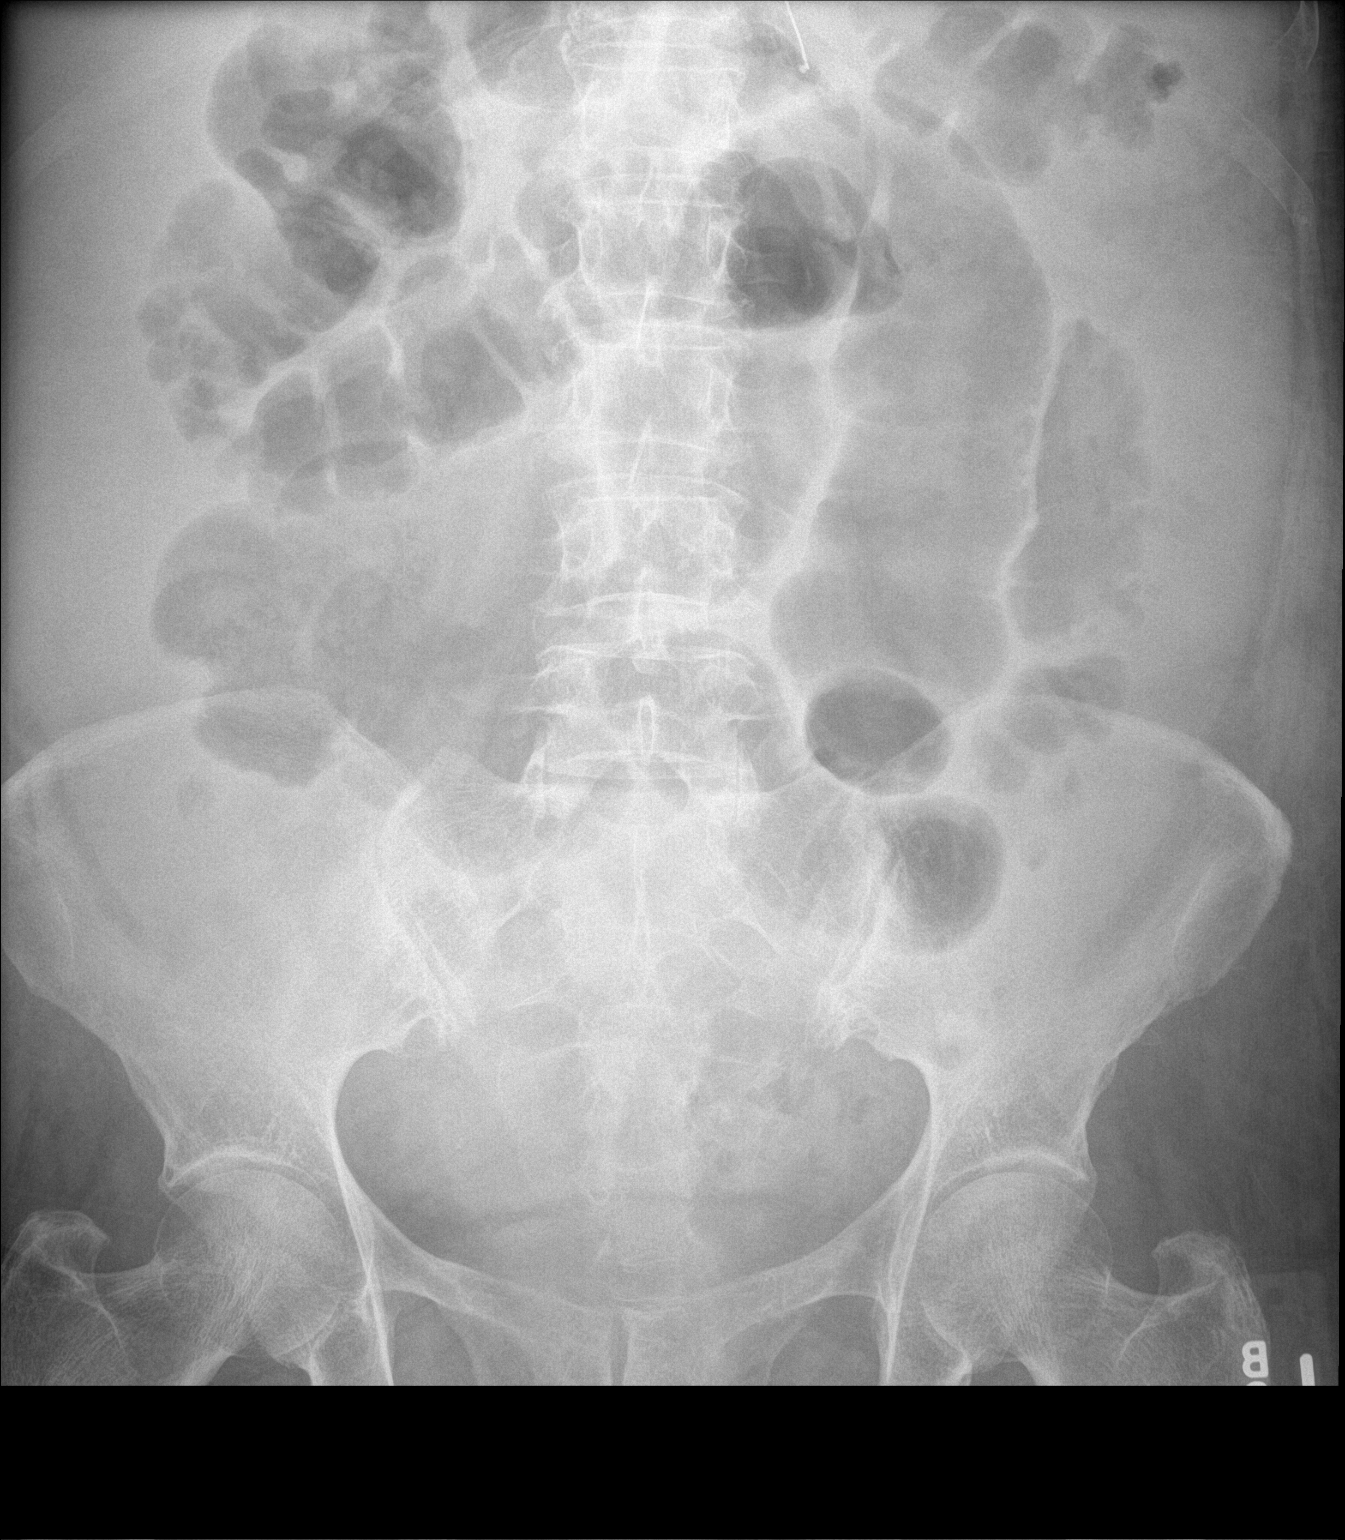

[abdomen kub (2 of 2)]
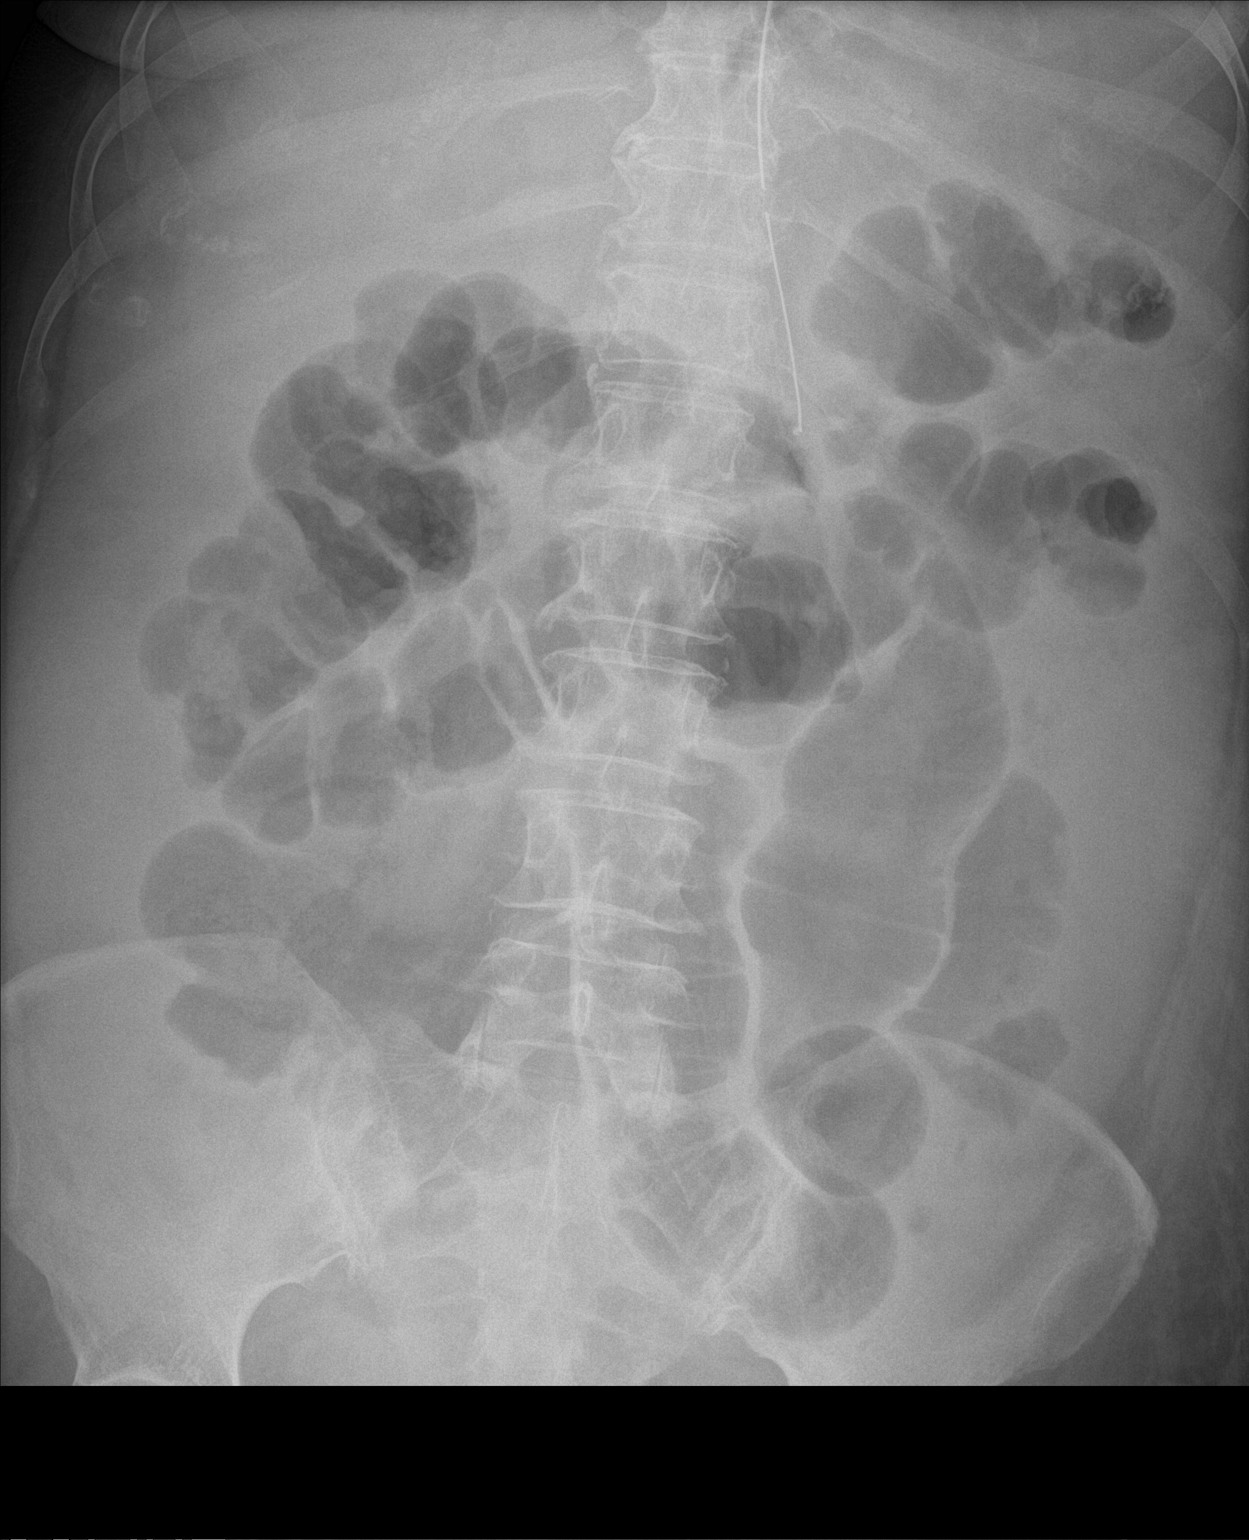

[2 of 2 positions shown; findings below may reference images not displayed]

FINDINGS: Enteric tube tip and side-port project over the left upper quadrant.
Re- demonstrated gaseous distended loops of small bowel within the
central abdomen measuring up to 5.5 cm. Gas is demonstrated within
the colon. Supine evaluation limited for the detection of free
intraperitoneal air. Lumbar spine degenerative changes.
IMPRESSION: Re- demonstrated distended loops of small bowel within the central
abdomen concerning for obstruction, potentially partial given
colonic gas.

## 2017-12-23 IMAGING — US US PARACENTESIS
1 series · 6 of 6 positions shown · non-contrast
Comparison: none

INDICATION: Patient with a history of metastatic ovarian cancer now with
recurrent abdominal ascites. Request is made for therapeutic
paracentesis.

[Series 1: us paracentesis · 0.26mm/px · 6 of 6 slices shown]
[im 1/6]
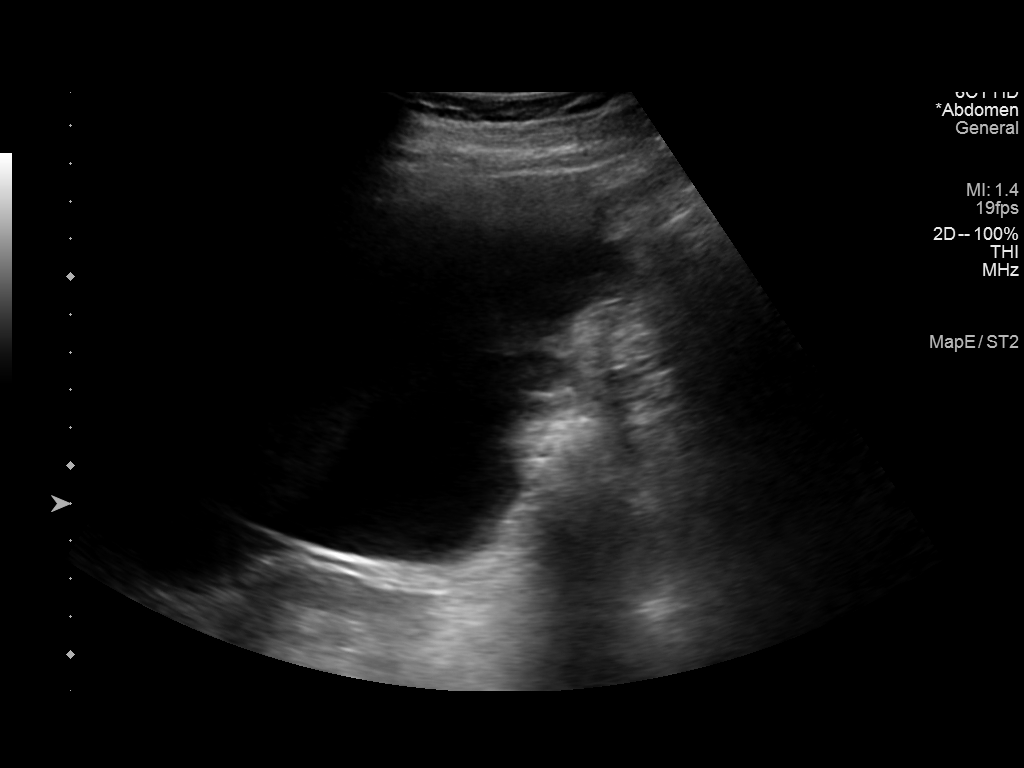
[im 2/6]
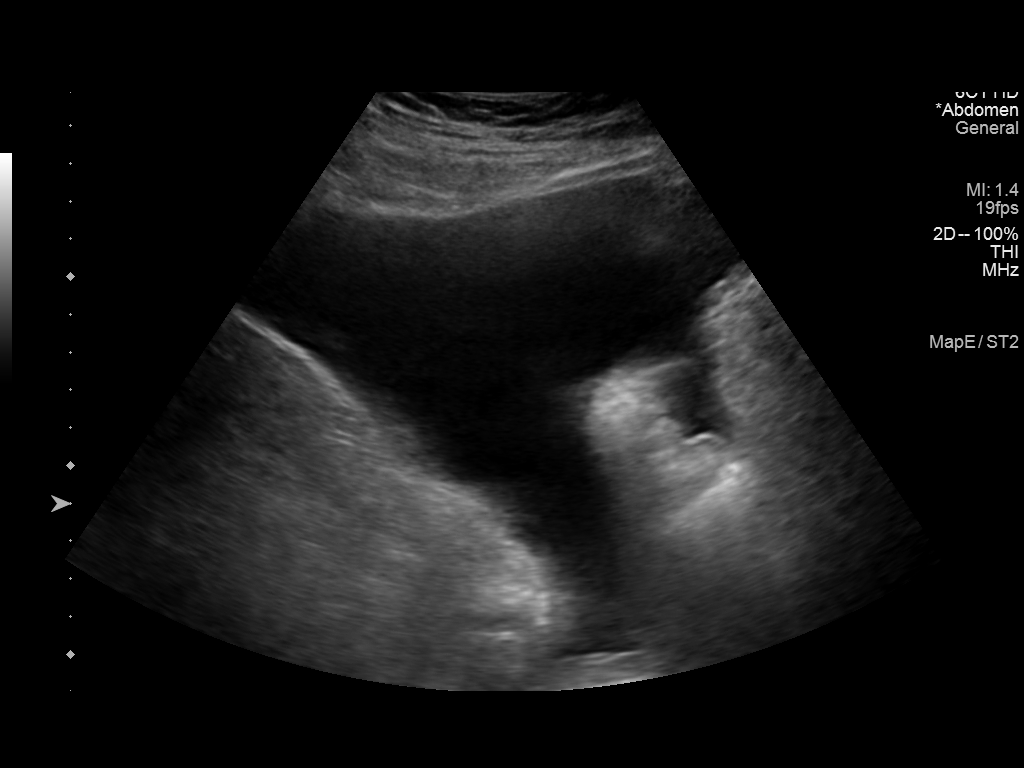
[im 3/6]
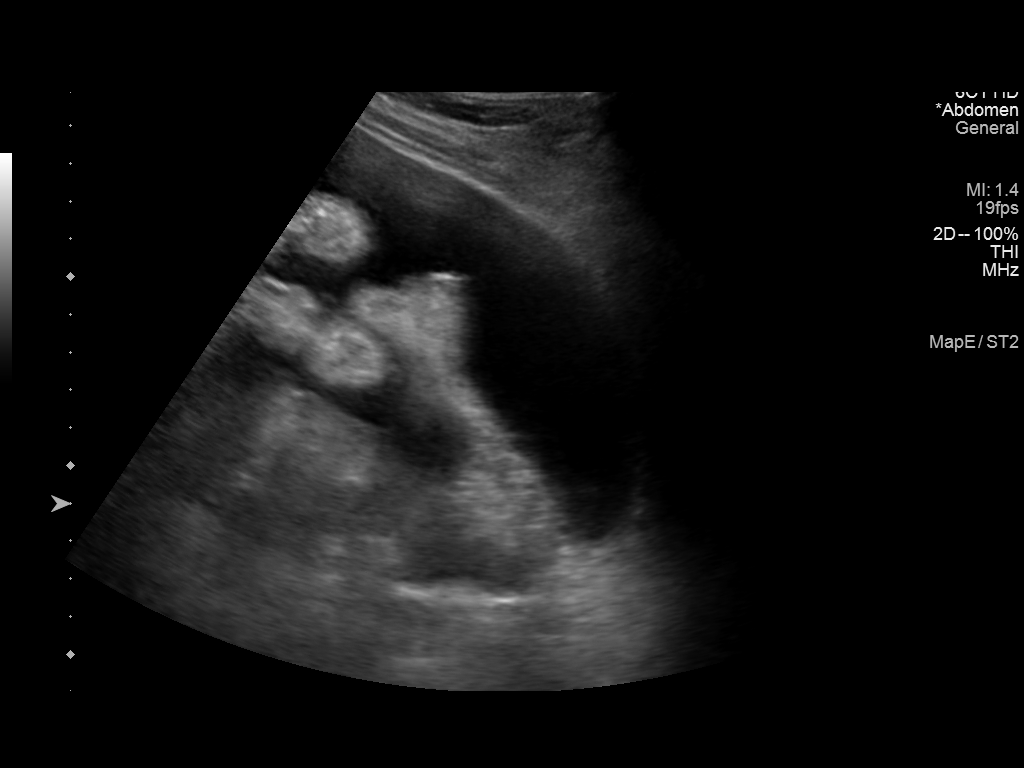
[im 4/6]
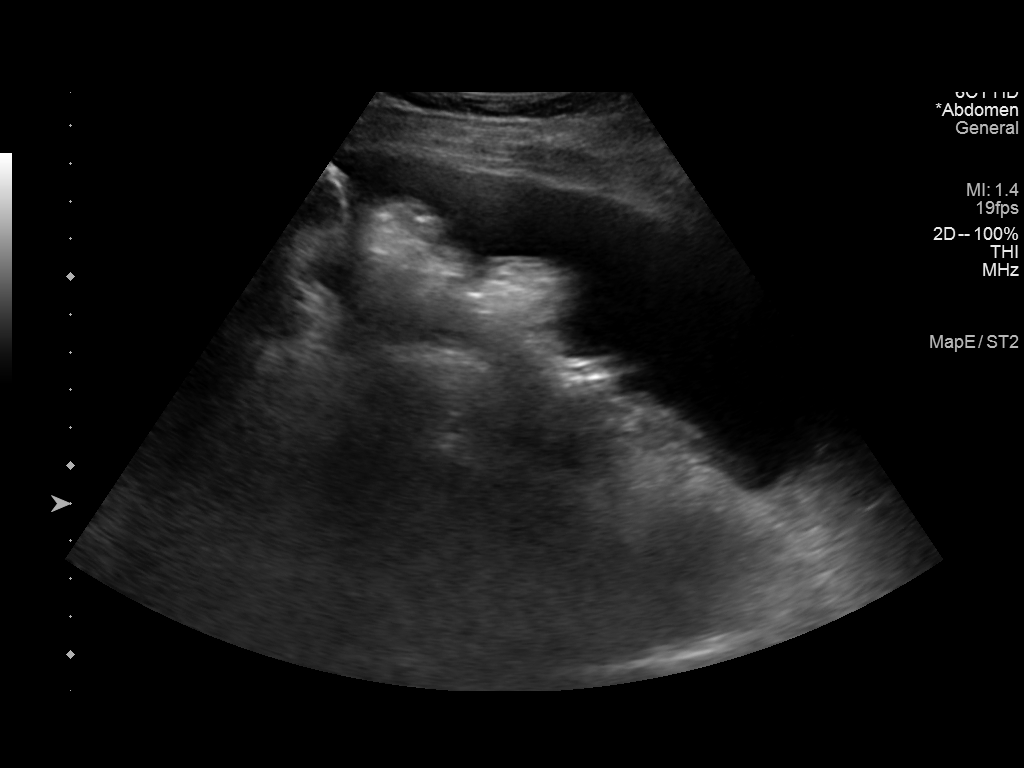
[im 5/6]
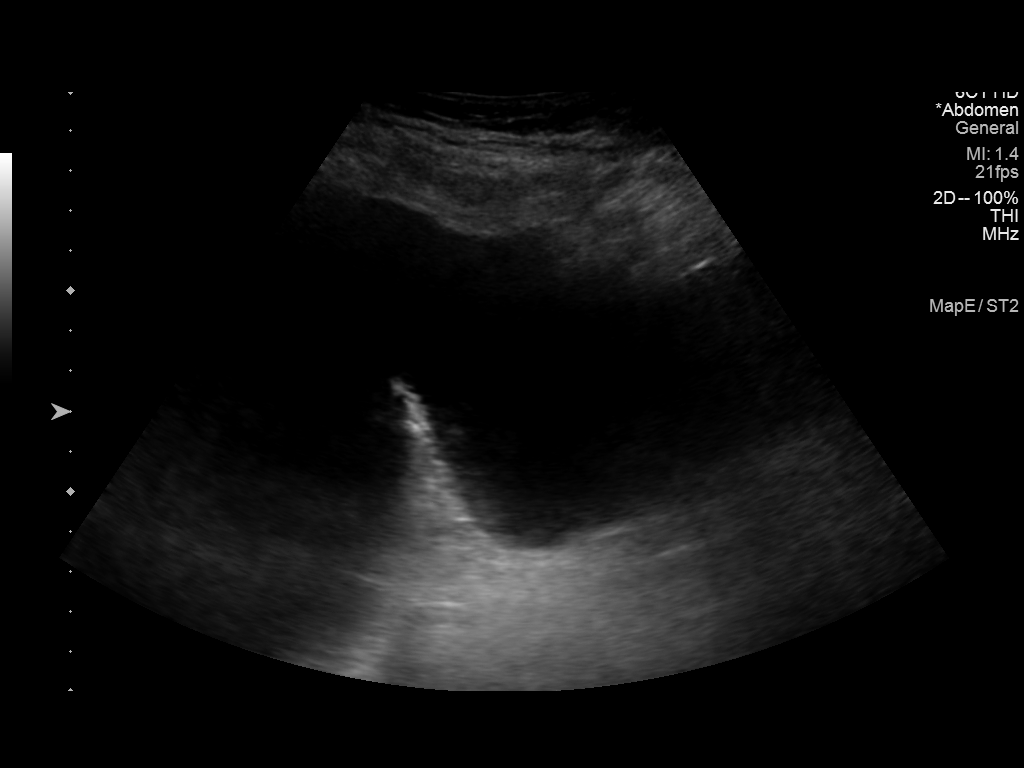
[im 6/6]
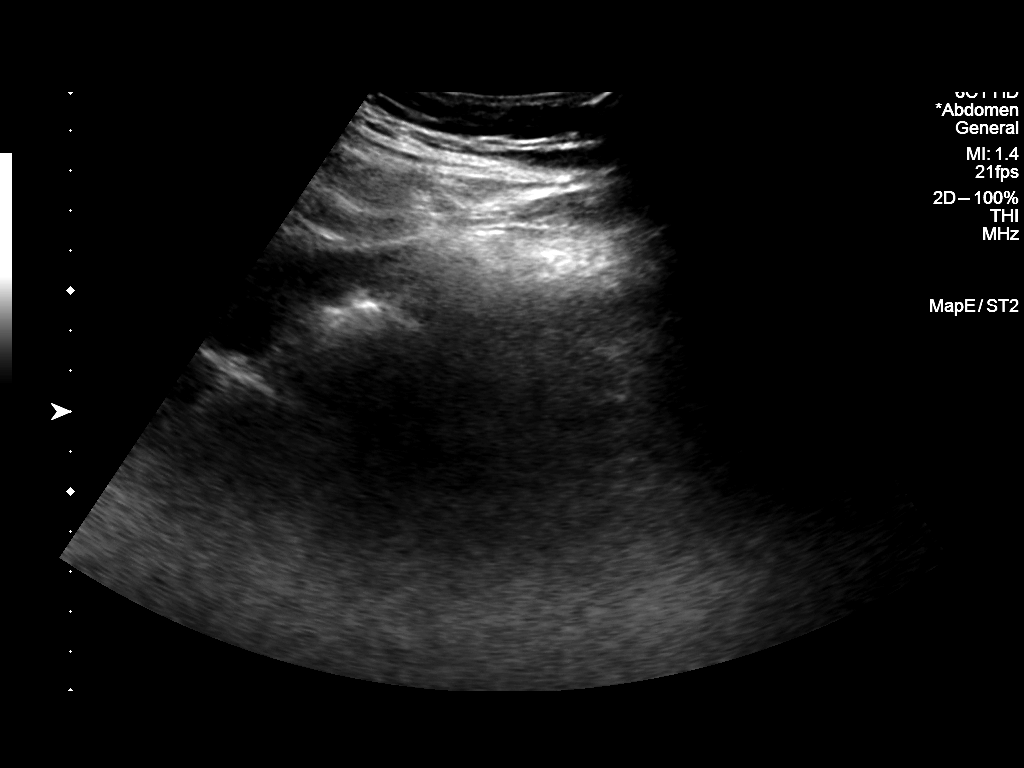

[6 of 6 positions shown; findings below may reference images not displayed]

EXAM:
ULTRASOUND GUIDED THERAPEUTIC PARACENTESIS

MEDICATIONS:
1% lidocaine

COMPLICATIONS:
None immediate.

PROCEDURE:
Informed written consent was obtained from the patient after a
discussion of the risks, benefits and alternatives to treatment. A
timeout was performed prior to the initiation of the procedure.

Initial ultrasound scanning demonstrates a moderate amount of
ascites within the right lower abdominal quadrant. The right lower
abdomen was prepped and draped in the usual sterile fashion. 1%
lidocaine was used for local anesthesia.

Following this, a 19 gauge, 7-cm, Yueh catheter was introduced. An
ultrasound image was saved for documentation purposes. The
paracentesis was performed. The catheter was removed and a dressing
was applied. The patient tolerated the procedure well without
immediate post procedural complication.
FINDINGS: A total of approximately 4 L of yellow fluid was removed.
IMPRESSION: Successful ultrasound-guided paracentesis yielding 4 liters of
peritoneal fluid.

## 2018-01-25 IMAGING — US US ABDOMEN PORT
1 series · 14 of 25 positions shown · non-contrast
Comparison: Ultrasound 05/28/2016.  CT 05/20/2016.

CLINICAL DATA: Progressive abdominal distension over the last 3
weeks. History of metastatic ovarian cancer.

EXAM:
ABDOMEN ULTRASOUND COMPLETE

[Series 1: us abdomen port · 0.26mm/px · 14 of 75 slices shown]
[im 1/75]
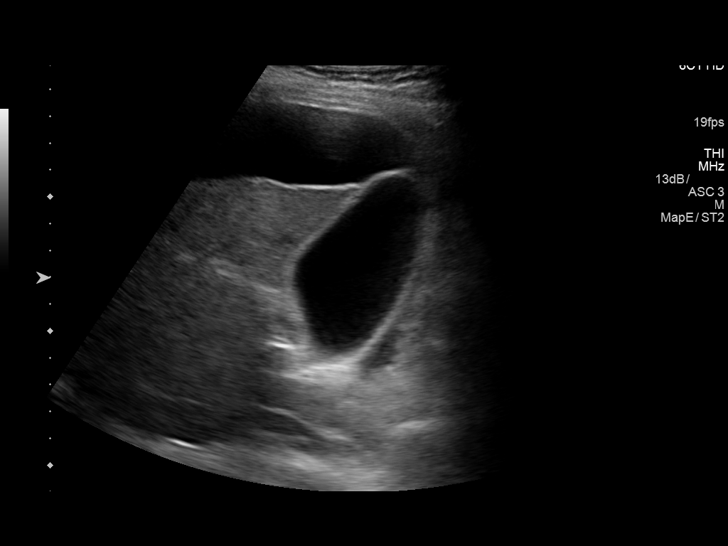
[im 7/75]
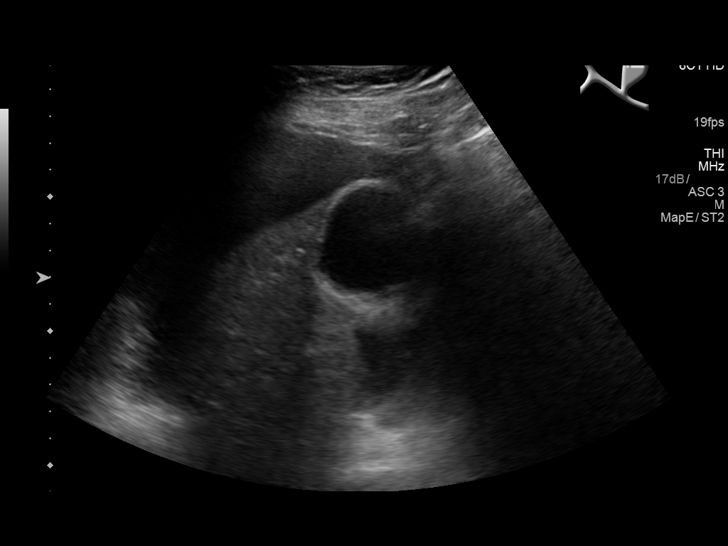
[im 13/75]
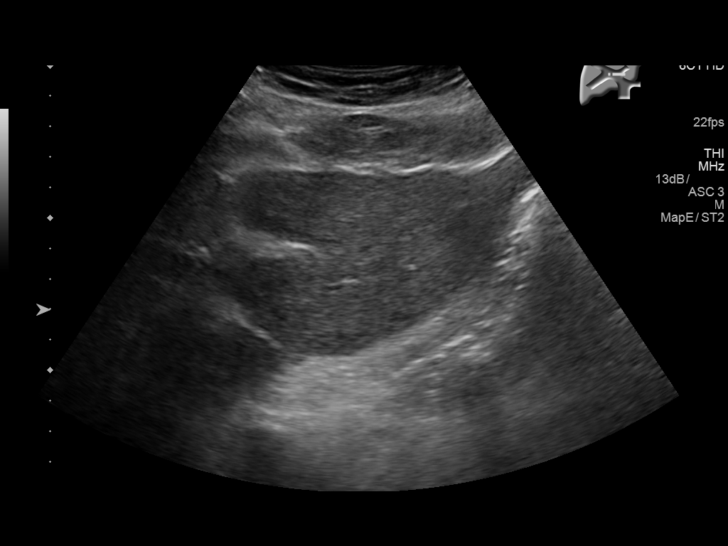
[im 19/75]
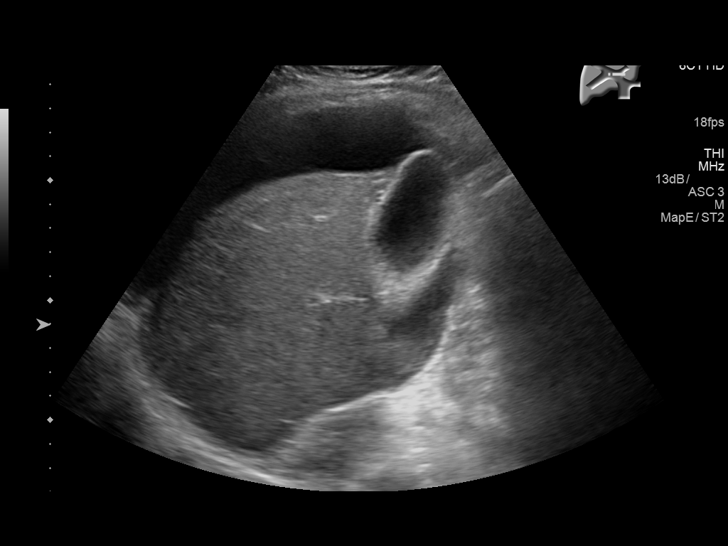
[im 25/75]
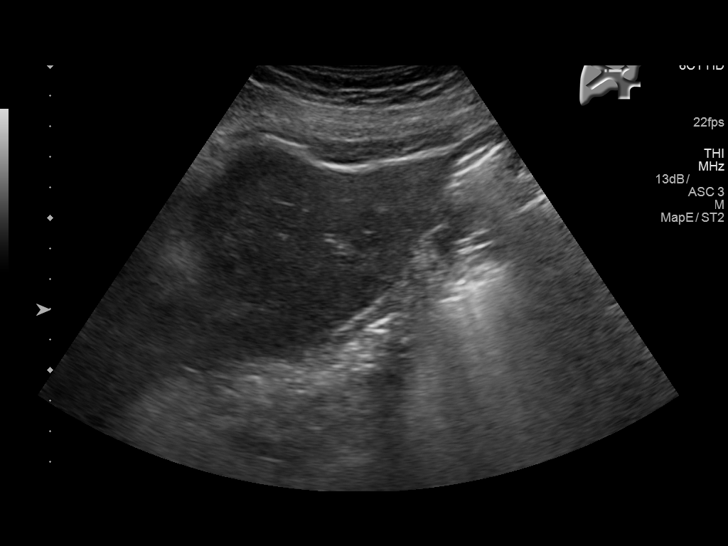
[im 28/75]
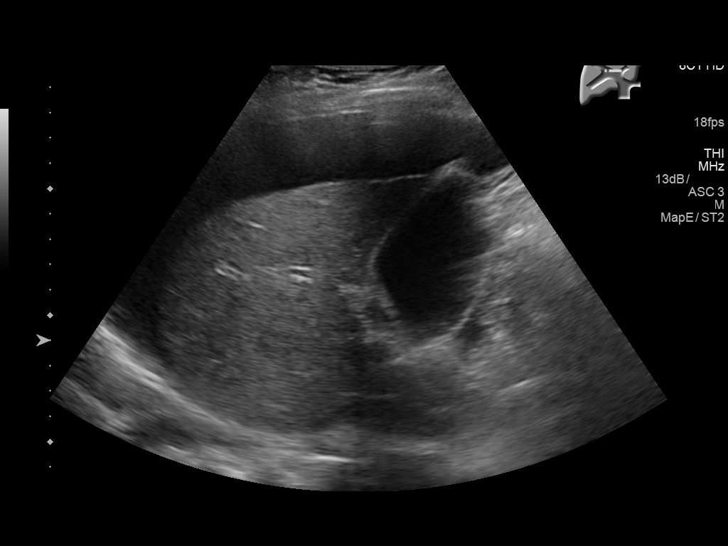
[im 34/75]
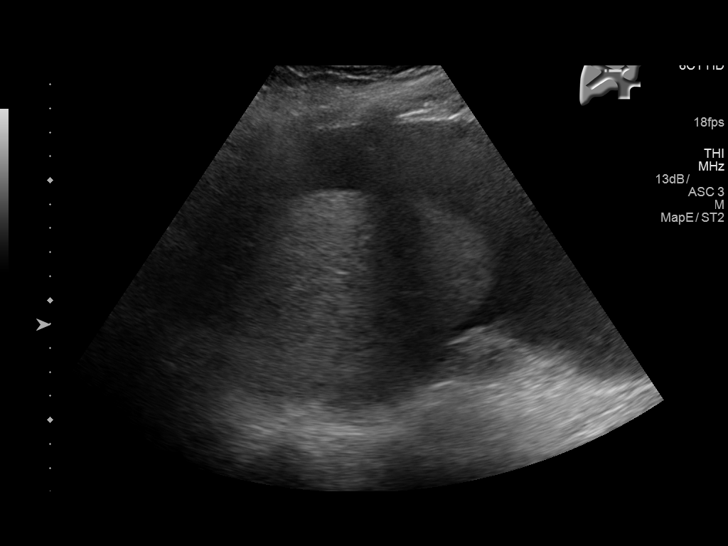
[im 41/75]
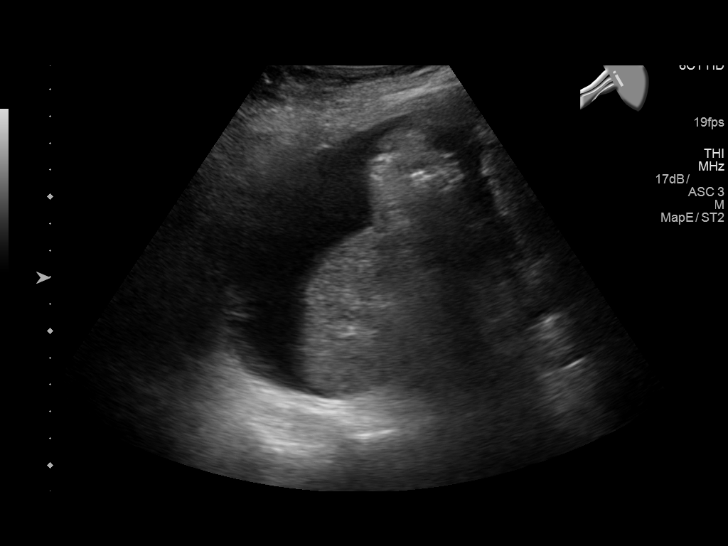
[im 47/75]
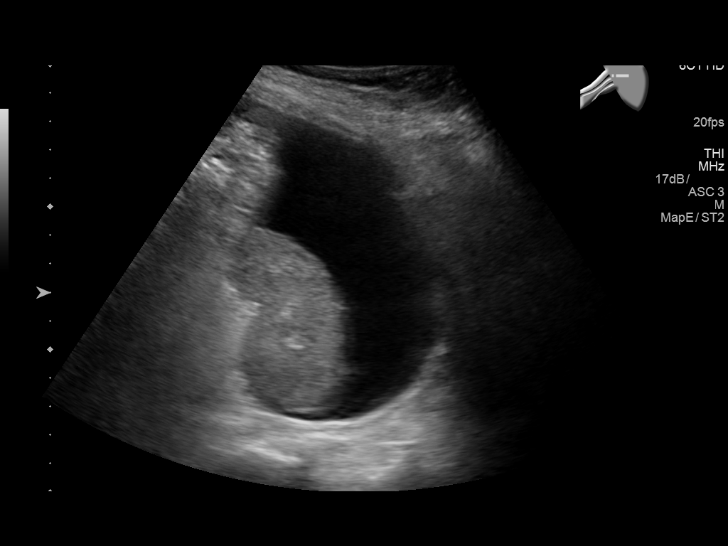
[im 50/75]
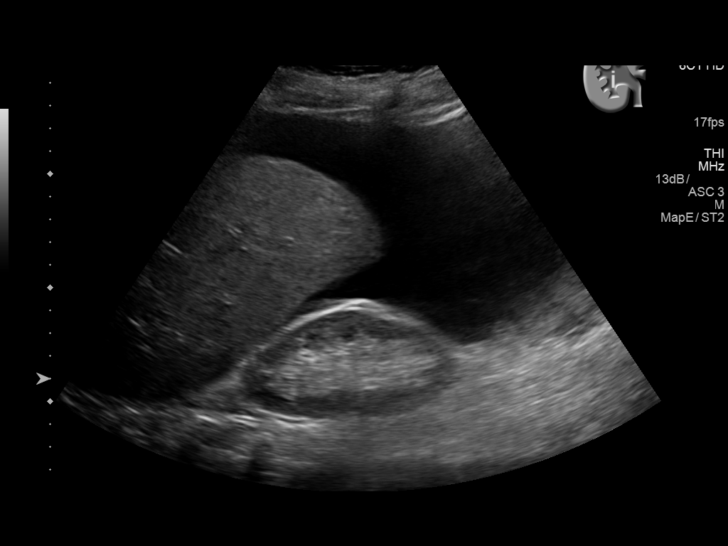
[im 56/75]
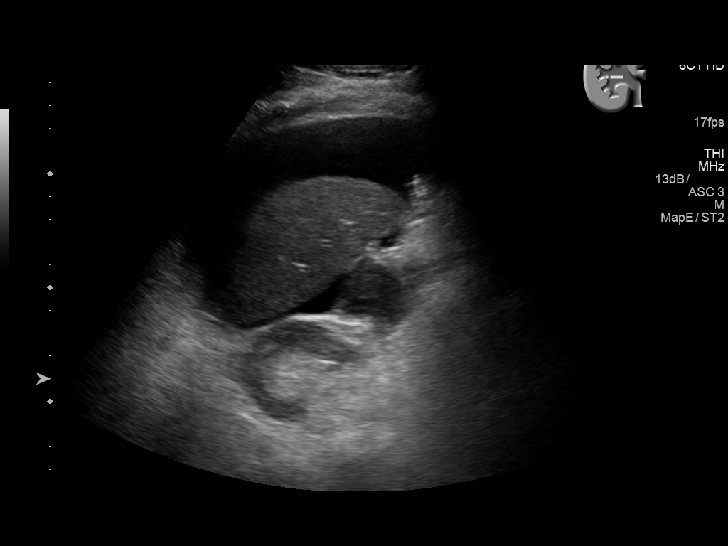
[im 62/75]
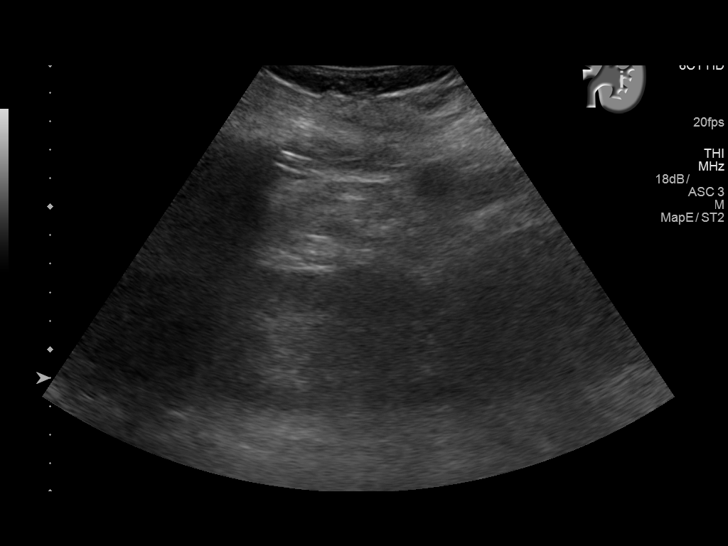
[im 68/75]
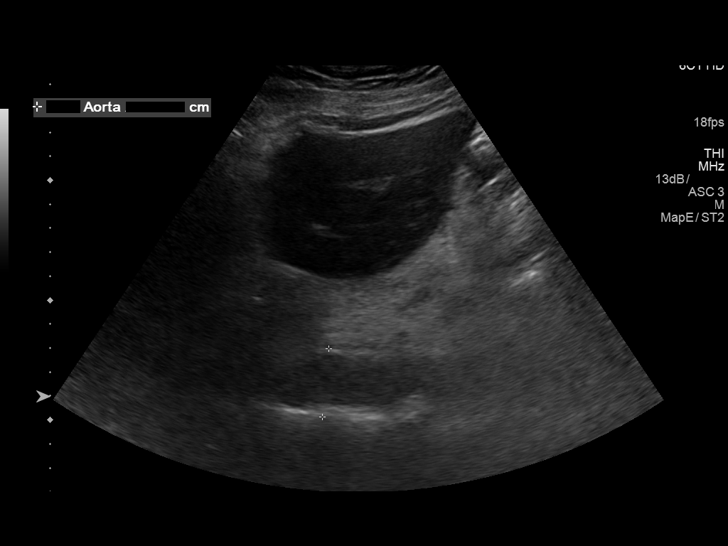
[im 75/75]
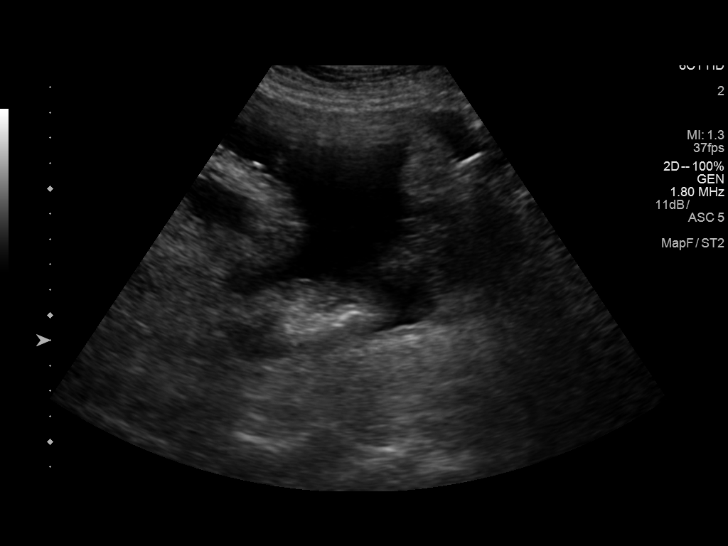

[14 of 25 positions shown; findings below may reference images not displayed]

FINDINGS: Gallbladder: No gallstones or wall thickening visualized. No
sonographic Murphy sign noted by sonographer.

Common bile duct: Diameter: 3 mm, normal

Liver: No focal lesion identified. Within normal limits in
parenchymal echogenicity.

IVC: No abnormality visualized.

Pancreas: Not visualized because of bowel gas.

Spleen: Size and appearance within normal limits.

Right Kidney: Length: 9.6 cm. Echogenicity within normal limits. No
mass or hydronephrosis visualized.

Left Kidney: Length: 9 x 1 cm. Echogenicity within normal limits. No
mass or hydronephrosis visualized.

Abdominal aorta: No aneurysm visualized.

Other findings: Moderate to large amount of ascites freely
distributed in all 4 quadrants. Increased since [DATE].
IMPRESSION: Increased amount of ascites, freely distributed in all 4 quadrants.
No other acute finding by ultrasound.
